# Patient Record
Sex: Female | Born: 1970
Health system: Southern US, Community
[De-identification: ages and names within clinical notes are randomized; demographics above are authoritative.]

## PROBLEM LIST (undated history)

## (undated) DIAGNOSIS — F32A Depression, unspecified: Secondary | ICD-10-CM

## (undated) DIAGNOSIS — K219 Gastro-esophageal reflux disease without esophagitis: Secondary | ICD-10-CM

## (undated) DIAGNOSIS — F419 Anxiety disorder, unspecified: Secondary | ICD-10-CM

## (undated) DIAGNOSIS — M6740 Ganglion, unspecified site: Secondary | ICD-10-CM

## (undated) DIAGNOSIS — J45909 Unspecified asthma, uncomplicated: Secondary | ICD-10-CM

## (undated) DIAGNOSIS — F3281 Premenstrual dysphoric disorder: Secondary | ICD-10-CM

## (undated) DIAGNOSIS — G894 Chronic pain syndrome: Secondary | ICD-10-CM

## (undated) DIAGNOSIS — M79602 Pain in left arm: Secondary | ICD-10-CM

## (undated) DIAGNOSIS — D571 Sickle-cell disease without crisis: Secondary | ICD-10-CM

## (undated) DIAGNOSIS — F329 Major depressive disorder, single episode, unspecified: Secondary | ICD-10-CM

## (undated) HISTORY — DX: Ganglion, unspecified site: M67.40

## (undated) HISTORY — DX: Sickle-cell disease without crisis: D57.1

---

## 2007-10-04 ENCOUNTER — Emergency Department (HOSPITAL_COMMUNITY): Admission: EM | Admit: 2007-10-04 | Discharge: 2007-10-04 | Payer: Self-pay | Admitting: Emergency Medicine

## 2008-07-27 ENCOUNTER — Ambulatory Visit: Payer: Self-pay | Admitting: Gastroenterology

## 2008-07-27 ENCOUNTER — Inpatient Hospital Stay (HOSPITAL_COMMUNITY): Admission: EM | Admit: 2008-07-27 | Discharge: 2008-07-29 | Payer: Self-pay | Admitting: Emergency Medicine

## 2008-07-28 ENCOUNTER — Encounter: Payer: Self-pay | Admitting: Gastroenterology

## 2008-11-12 ENCOUNTER — Emergency Department (HOSPITAL_COMMUNITY): Admission: EM | Admit: 2008-11-12 | Discharge: 2008-11-12 | Payer: Self-pay | Admitting: Emergency Medicine

## 2009-02-14 ENCOUNTER — Emergency Department (HOSPITAL_COMMUNITY): Admission: EM | Admit: 2009-02-14 | Discharge: 2009-02-14 | Payer: Self-pay | Admitting: Emergency Medicine

## 2009-02-16 ENCOUNTER — Other Ambulatory Visit: Admission: RE | Admit: 2009-02-16 | Discharge: 2009-02-16 | Payer: Self-pay | Admitting: Family Medicine

## 2009-02-20 ENCOUNTER — Emergency Department (HOSPITAL_COMMUNITY): Admission: EM | Admit: 2009-02-20 | Discharge: 2009-02-20 | Payer: Self-pay | Admitting: Emergency Medicine

## 2009-09-15 ENCOUNTER — Emergency Department (HOSPITAL_COMMUNITY): Admission: EM | Admit: 2009-09-15 | Discharge: 2009-09-15 | Payer: Self-pay | Admitting: Family Medicine

## 2009-10-07 ENCOUNTER — Emergency Department (HOSPITAL_COMMUNITY): Admission: EM | Admit: 2009-10-07 | Discharge: 2009-10-07 | Payer: Self-pay | Admitting: Emergency Medicine

## 2009-10-12 ENCOUNTER — Emergency Department (HOSPITAL_COMMUNITY): Admission: EM | Admit: 2009-10-12 | Discharge: 2009-10-12 | Payer: Self-pay | Admitting: Family Medicine

## 2010-08-16 LAB — DIFFERENTIAL
Basophils Absolute: 0.1 10*3/uL (ref 0.0–0.1)
Basophils Relative: 1 % (ref 0–1)
Eosinophils Absolute: 0.2 10*3/uL (ref 0.0–0.7)
Eosinophils Relative: 2 % (ref 0–5)
Monocytes Absolute: 0.7 10*3/uL (ref 0.1–1.0)
Neutrophils Relative %: 69 % (ref 43–77)

## 2010-08-16 LAB — BASIC METABOLIC PANEL
BUN: 8 mg/dL (ref 6–23)
Calcium: 9.2 mg/dL (ref 8.4–10.5)
GFR calc non Af Amer: >60 mL/min (ref >60)
Glucose, Bld: 89 mg/dL (ref 70–99)

## 2010-08-16 LAB — CBC
Hemoglobin: 10.3 g/dL — ABNORMAL LOW (ref 12.0–15.0)
MCHC: 33.6 g/dL (ref 30.0–36.0)
MCV: 79.5 fL (ref 78.0–100.0)
RDW: 19.3 % — ABNORMAL HIGH (ref 11.5–15.5)
WBC: 10.7 10*3/uL — ABNORMAL HIGH (ref 4.0–10.5)

## 2010-08-16 LAB — URINALYSIS, ROUTINE W REFLEX MICROSCOPIC
Glucose, UA: NEGATIVE mg/dL
Nitrite: NEGATIVE
Specific Gravity, Urine: 1.015 (ref 1.005–1.030)
Urobilinogen, UA: 1 mg/dL (ref 0.0–1.0)

## 2010-08-16 LAB — RETICULOCYTES
Retic Count, Absolute: 172 10*3/uL (ref 19.0–186.0)
Retic Ct Pct: 4.4 % — ABNORMAL HIGH (ref 0.4–3.1)

## 2010-08-16 LAB — POCT RAPID STREP A (OFFICE): Streptococcus, Group A Screen (Direct): NEGATIVE

## 2010-08-16 LAB — GLUCOSE, CAPILLARY: Glucose-Capillary: 82 mg/dL (ref 70–99)

## 2010-08-23 LAB — GLUCOSE, CAPILLARY
Glucose-Capillary: 107 mg/dL — ABNORMAL HIGH (ref 70–99)
Glucose-Capillary: 123 mg/dL — ABNORMAL HIGH (ref 70–99)
Glucose-Capillary: 129 mg/dL — ABNORMAL HIGH (ref 70–99)
Glucose-Capillary: 98 mg/dL (ref 70–99)

## 2010-08-23 LAB — BASIC METABOLIC PANEL
BUN: 3 mg/dL — ABNORMAL LOW (ref 6–23)
BUN: 7 mg/dL (ref 6–23)
CO2: 25 mEq/L (ref 19–32)
CO2: 28 mEq/L (ref 19–32)
Chloride: 113 mEq/L — ABNORMAL HIGH (ref 96–112)
Glucose, Bld: 80 mg/dL (ref 70–99)
Potassium: 3.8 mEq/L (ref 3.5–5.1)
Potassium: 3.9 mEq/L (ref 3.5–5.1)
Sodium: 140 mEq/L (ref 135–145)

## 2010-08-23 LAB — CBC
HCT: 27.9 % — ABNORMAL LOW (ref 36.0–46.0)
HCT: 28.8 % — ABNORMAL LOW (ref 36.0–46.0)
Hemoglobin: 9.8 g/dL — ABNORMAL LOW (ref 12.0–15.0)
MCHC: 33.5 g/dL (ref 30.0–36.0)
MCHC: 35.2 g/dL (ref 30.0–36.0)
MCV: 78.7 fL (ref 78.0–100.0)
MCV: 78.8 fL (ref 78.0–100.0)
MCV: 78.8 fL (ref 78.0–100.0)
Platelets: 126 10*3/uL — ABNORMAL LOW (ref 150–400)
Platelets: 151 10*3/uL (ref 150–400)
RDW: 19 % — ABNORMAL HIGH (ref 11.5–15.5)
WBC: 8.2 10*3/uL (ref 4.0–10.5)

## 2010-08-23 LAB — HEMOGLOBIN AND HEMATOCRIT, BLOOD
HCT: 27.4 % — ABNORMAL LOW (ref 36.0–46.0)
HCT: 28 % — ABNORMAL LOW (ref 36.0–46.0)
HCT: 28.4 % — ABNORMAL LOW (ref 36.0–46.0)
HCT: 28.6 % — ABNORMAL LOW (ref 36.0–46.0)
HCT: 31.4 % — ABNORMAL LOW (ref 36.0–46.0)
Hemoglobin: 9.1 g/dL — ABNORMAL LOW (ref 12.0–15.0)
Hemoglobin: 9.6 g/dL — ABNORMAL LOW (ref 12.0–15.0)
Hemoglobin: 9.8 g/dL — ABNORMAL LOW (ref 12.0–15.0)

## 2010-08-23 LAB — DIFFERENTIAL
Basophils Relative: 0 % (ref 0–1)
Lymphocytes Relative: 18 % (ref 12–46)
Neutrophils Relative %: 74 % (ref 43–77)

## 2010-08-23 LAB — TYPE AND SCREEN
DAT, IgG: NEGATIVE
PT AG Type: NEGATIVE

## 2010-08-23 LAB — COMPREHENSIVE METABOLIC PANEL
ALT: 14 U/L (ref 0–35)
Albumin: 4.1 g/dL (ref 3.5–5.2)
Calcium: 9.1 mg/dL (ref 8.4–10.5)
GFR calc Af Amer: >60 mL/min (ref >60)
GFR calc non Af Amer: >60 mL/min (ref >60)
Total Bilirubin: 2.3 mg/dL — ABNORMAL HIGH (ref 0.3–1.2)
Total Protein: 7.3 g/dL (ref 6.0–8.3)

## 2010-08-23 LAB — URINALYSIS, ROUTINE W REFLEX MICROSCOPIC
Ketones, ur: NEGATIVE mg/dL
Nitrite: NEGATIVE
Protein, ur: NEGATIVE mg/dL
Specific Gravity, Urine: 1.014 (ref 1.005–1.030)
Urobilinogen, UA: 0.2 mg/dL (ref 0.0–1.0)
pH: 6 (ref 5.0–8.0)

## 2010-08-23 LAB — HEMOCCULT GUIAC POC 1CARD (OFFICE): Fecal Occult Bld: POSITIVE

## 2010-08-23 LAB — PROTIME-INR: Prothrombin Time: 14.5 seconds (ref 11.6–15.2)

## 2010-08-23 LAB — POCT PREGNANCY, URINE: Preg Test, Ur: NEGATIVE

## 2010-09-25 NOTE — H&P (Signed)
NAMEBROOKLYN, Cassie             ACCOUNT NO.:  [id number]   MEDICAL RECORD NO.:  [id number]          PATIENT TYPE:  INP   LOCATION:  5502                         FACILITY:  [facility]   PHYSICIAN:  Sabino Donovan, MD        DATE OF BIRTH:  01/22/1971   DATE OF ADMISSION:  07/26/2008  DATE OF DISCHARGE:                              HISTORY & PHYSICAL   PRIMARY CARE PHYSICIAN:  Renaye Rakers, MD.   CHIEF COMPLAINT:  Rectal bleeding.   HISTORY OF PRESENT ILLNESS:  The patient is a 40 year old obese African  American female with a history of diabetes, sickle cell anemia who  presented with complaint of back pain and rectal bleeding.  She report  she started as a back pain and then she had 4 episodes of diarrhea all  with bright red blood per rectum.  She reports that blood was frank and  dripping into the toilet seat.  Otherwise, she denies any prior history  of bright red blood per rectum, constipation.  Denies any heavy NSAID  use or any heavy alcohol use.  Denies any prior history of liver disease  or prior diagnosis of hemorrhoids.  She denies any dizziness,  lightheadedness, or any orthostatic symptoms.   PAST MEDICAL HISTORY:  1. Diabetes.  2. Sickle cell anemia, her last crisis was many years ago and      currently controlled without any medication.  3. Major depressive disorder.  4. History of H. pylori for which she is on Nexium.   PAST SURGICAL HISTORY:  She had C-section x2.   FAMILY HISTORY:  Noncontributory.  She has no family history of coronary  artery disease or bleeding disorder.   SOCIAL HISTORY:  No tobacco.  No alcohol.  No IV drug use.   DRUG ALLERGIES:  She is allergic to SULFA, causes her bilirubin to go  up.   MEDICATION:  1. Metformin 500 mg p.o. daily.  2. Lexapro 20 mg p.o. daily.  3. Nexium 40 mg p.o. daily.   REVIEW OF SYMPTOMS:  Unremarkable.   PHYSICAL EXAMINATION:  VITAL SIGNS:  Temperature 98.5, pulse 104,  respiratory rate 24, blood pressure  125/86.  GENERAL:  She is an obese female in no acute distress.  HEENT:  PERRLA.  EOMI.  NECK:  No lymphadenopathy, thyromegaly.  No JVD.  CHEST:  Clear to auscultation bilaterally.  CARDIOVASCULAR:  Regular rate and rhythm.  No murmurs, rubs, or gallops.  ABDOMEN:  Soft, nontender, nondistended.  Normoactive bowel sounds.  EXTREMITIES:  No clubbing, cyanosis, or edema.  BACK:  She has focal tenderness to palpation in lower lumbar area.   LABORATORY DATA:  Sodium 140, potassium 4.1, BUN 11, and creatinine 0.8.  White count 9.6, H&H 10.7 and 32.1, and platelets 151.  AST is 13, ALT  14, total protein 7.3, albumin 4.1, hCG is negative.  Urinalysis was  unremarkable.  Chest x-ray shows no acute findings.  Lumbar x-ray was  unremarkable for any acute changes.  CT of the abdomen was done, which  shows enlarged spleen 18 x 12 x 10 cm.  She also  had two small liver  lesions, otherwise unremarkable.  Coag panel was not sent in the ER.   IMPRESSION:  A 40 year old [place] American female with history of  diabetes, sickle cell anemia, now presented for lower gastrointestinal  bleed.  1. Lower gastrointestinal bleed.  Given her age and no risk factors,      likely hemorrhoidal in nature.  However, she is hemodynamically      stable at this time.  We will give normal saline.  We will hold any      antiplatelet agent, we will check PT and PTT.  We will consult in      GI.  We will start the patient on Protonix, although low suspicion      for ulcer bleeding.  2. Diabetes.  Continue sliding scale insulin.  Hold metformin for now      and keep the patient n.p.o.  3. Sickle cell.  No crisis at present.  We will continue normal saline      and give p.r.n. medication.  4. Back pain.  Her lumbar x-ray has been unremarkable, likely      musculoskeletal in nature.  We will continue pain management.  5. Prophylaxis.  On Protonix.      Sabino Donovan, MD  Electronically Signed     MJ/MEDQ  D:   07/27/2008  T:  07/27/2008  Job:  [id number]

## 2010-09-25 NOTE — Discharge Summary (Signed)
Cassie Montgomery, Cassie Montgomery             ACCOUNT NO.:  [id number]   MEDICAL RECORD NO.:  [id number]          PATIENT TYPE:  INP   LOCATION:  5502                         FACILITY:  [facility]   PHYSICIAN:  Michelene Gardener, MD    DATE OF BIRTH:  28-Sep-1970   DATE OF ADMISSION:  07/26/2008  DATE OF DISCHARGE:  07/29/2008                               DISCHARGE SUMMARY   DISCHARGE DIAGNOSES:  1. Lower gastrointestinal bleed secondary to hemorrhoids.  2. Acute post-hemorrhagic anemia secondary to hemorrhoids.  3. Diabetes mellitus.  4. Sickle cell anemia without crisis.  5. Back pain.   DISCHARGE MEDICATIONS:  1. Metformin 500 mg p.o. once daily.  2. Lexapro 20 mg once a day.  3. Nexium 40 mg once a day.   CONSULTATIONS:  GI consult.   PROCEDURES:  Colonoscopy done on July 28, 2008, and it showed normal  colon with internal and external hemorrhoids.   RADIOLOGY STUDIES:  1. Chest x-ray on July 26, 2008, showed no acute findings.  2. Lumbar spine x-ray on July 26, 2008, showed no acute findings.  3. CT scan of the abdomen with contrast on July 27, 2008, showed 2      indeterminate liver lesions with mild splenomegaly and no acute      abdominal findings.  4. CT scan of the pelvis with contrast showed no acute pelvic      findings.  5. Repeat chest x-ray on July 27, 2008, showed no acute findings.   COURSE OF HOSPITALIZATION:  1. Lower GI bleed.  This patient was admitted to the hospital for      further evaluation.  She was started on IV fluids.  Her hemoglobin      has been followed and that has been stable throughout her      hospitalization.  GI consultation was done, and the patient was      recommended for colonoscopy, which was performed on July 28, 2008.      Results of colonoscopy showed normal colon with external and      internal hemorrhoids.  GI think her source of bleeding is her      hemorrhoids and no need for further intervention at this time.  The      patient was  restarted on regular diet, as she tolerated well.  She      would be discharged home today, and she was asked to follow with      her primary doctor to repeat her hemoglobin.  2. Acute post-hemorrhagic anemia that was secondary to hemorrhoids,      and as mentioned above, hemoglobin remains stable in the range      between 9-10 and no need for blood transfusion.  3. Diabetes mellitus that remains stable in the hospital.  Her      metformin was held in the hospital, as she was put on sliding scale      coverage.  By the time of discharge, her metformin was restarted.  4. Sickle cell anemia.  The patient does not have any evidence of      crisis and  no acute intervention in the hospital.  5. Back pain.  This is most likely secondary to muscular sprain, and      as mentioned above, lumbar spine x-ray was done and it was      negative.  6. Liver lesion.  This is an incidental finding in the CT scan of the      abdomen.  Findings were discussed with GI, think those are      nonspecific and GI recommended either repeat CAT scan in 3-6 months      or an MRI as an outpatient for further evaluation.  Findings were      discussed with the patient, and those would be followed by her      primary doctor.   TOTAL ASSESSMENT TIME:  40 minutes.      Michelene Gardener, MD  Electronically Signed     NAE/MEDQ  D:  07/29/2008  T:  07/29/2008  Job:  [id number]   cc:   Renaye Rakers, M.D.

## 2010-10-10 ENCOUNTER — Other Ambulatory Visit: Payer: Self-pay | Admitting: Family Medicine

## 2010-10-10 DIAGNOSIS — Z1231 Encounter for screening mammogram for malignant neoplasm of breast: Secondary | ICD-10-CM

## 2010-10-16 ENCOUNTER — Ambulatory Visit
Admission: RE | Admit: 2010-10-16 | Discharge: 2010-10-16 | Disposition: A | Discharge status: Home / Self Care | Payer: Medicaid Other | Source: Ambulatory Visit | Attending: Family Medicine | Admitting: Family Medicine

## 2010-10-16 DIAGNOSIS — Z1231 Encounter for screening mammogram for malignant neoplasm of breast: Secondary | ICD-10-CM

## 2010-11-22 ENCOUNTER — Encounter: Payer: Medicaid Other | Admitting: Oncology

## 2010-12-04 ENCOUNTER — Other Ambulatory Visit: Payer: Self-pay | Admitting: Oncology

## 2010-12-04 ENCOUNTER — Encounter (HOSPITAL_BASED_OUTPATIENT_CLINIC_OR_DEPARTMENT_OTHER): Payer: Medicare Other | Admitting: Oncology

## 2010-12-04 DIAGNOSIS — D571 Sickle-cell disease without crisis: Secondary | ICD-10-CM

## 2010-12-04 LAB — CBC & DIFF AND RETIC
BASO%: 0.2 % (ref 0.0–2.0)
EOS%: 2.5 % (ref 0.0–7.0)
Eosinophils Absolute: 0.2 10*3/uL (ref 0.0–0.5)
LYMPH%: 20.3 % (ref 14.0–49.7)
MCH: 24.7 pg — ABNORMAL LOW (ref 25.1–34.0)
MCHC: 35.1 g/dL (ref 31.5–36.0)
MCV: 70.4 fL — ABNORMAL LOW (ref 79.5–101.0)
MONO%: 5.3 % (ref 0.0–14.0)
Platelets: 148 10*3/uL (ref 145–400)
RBC: 4.25 10*6/uL (ref 3.70–5.45)
RDW: 17.4 % — ABNORMAL HIGH (ref 11.2–14.5)
nRBC: 0 % (ref 0–0)

## 2010-12-04 LAB — MORPHOLOGY: PLT EST: ADEQUATE

## 2010-12-05 LAB — COMPREHENSIVE METABOLIC PANEL

## 2010-12-05 LAB — HEPATITIS PANEL, ACUTE
Hep A IgM: NEGATIVE
Hep B C IgM: NEGATIVE

## 2011-01-31 ENCOUNTER — Encounter (HOSPITAL_BASED_OUTPATIENT_CLINIC_OR_DEPARTMENT_OTHER): Payer: Medicaid Other | Admitting: Oncology

## 2011-01-31 ENCOUNTER — Other Ambulatory Visit: Payer: Self-pay | Admitting: Oncology

## 2011-01-31 DIAGNOSIS — D571 Sickle-cell disease without crisis: Secondary | ICD-10-CM

## 2011-01-31 LAB — CBC WITH DIFFERENTIAL/PLATELET
Eosinophils Absolute: 0.2 10*3/uL (ref 0.0–0.5)
MONO#: 0.5 10*3/uL (ref 0.1–0.9)
NEUT#: 6.4 10*3/uL (ref 1.5–6.5)
Platelets: 128 10*3/uL — ABNORMAL LOW (ref 145–400)
RBC: 4.01 10*6/uL (ref 3.70–5.45)
RDW: 17 % — ABNORMAL HIGH (ref 11.2–14.5)
WBC: 9.1 10*3/uL (ref 3.9–10.3)
nRBC: 0 % (ref 0–0)

## 2011-02-06 LAB — POCT PREGNANCY, URINE
Operator id: 151321
Preg Test, Ur: NEGATIVE

## 2011-02-06 LAB — DIFFERENTIAL
Basophils Relative: 0
Eosinophils Absolute: 0.2
Eosinophils Relative: 2
Lymphocytes Relative: 20
Monocytes Absolute: 0.4
Neutrophils Relative %: 73

## 2011-02-06 LAB — COMPREHENSIVE METABOLIC PANEL
ALT: 10
Alkaline Phosphatase: 48
BUN: 11
CO2: 25
Calcium: 8.9
GFR calc non Af Amer: >60
Glucose, Bld: 86
Sodium: 142
Total Protein: 6.9

## 2011-02-06 LAB — URINALYSIS, ROUTINE W REFLEX MICROSCOPIC
Nitrite: NEGATIVE
Protein, ur: NEGATIVE
Urobilinogen, UA: 0.2

## 2011-02-06 LAB — CBC
MCV: 79
RDW: 16.9 — ABNORMAL HIGH
WBC: 7.6

## 2011-02-06 LAB — RETICULOCYTES: Retic Ct Pct: 4.2 — ABNORMAL HIGH

## 2011-04-24 ENCOUNTER — Other Ambulatory Visit: Payer: Self-pay

## 2011-04-24 DIAGNOSIS — D571 Sickle-cell disease without crisis: Secondary | ICD-10-CM

## 2011-04-24 MED ORDER — OXYCODONE-ACETAMINOPHEN 5-325 MG PO TABS: 1.0000 | ORAL_TABLET | ORAL | Status: DC | PRN

## 2011-05-04 ENCOUNTER — Emergency Department (HOSPITAL_COMMUNITY)
Admission: EM | Admit: 2011-05-04 | Discharge: 2011-05-05 | Disposition: A | Discharge status: Home / Self Care | Payer: Medicare Other | Attending: Emergency Medicine | Admitting: Emergency Medicine

## 2011-05-04 DIAGNOSIS — M25462 Effusion, left knee: Secondary | ICD-10-CM

## 2011-05-04 DIAGNOSIS — M25562 Pain in left knee: Secondary | ICD-10-CM

## 2011-05-04 DIAGNOSIS — M25569 Pain in unspecified knee: Secondary | ICD-10-CM | POA: Insufficient documentation

## 2011-05-04 DIAGNOSIS — M25469 Effusion, unspecified knee: Secondary | ICD-10-CM | POA: Insufficient documentation

## 2011-05-04 DIAGNOSIS — D571 Sickle-cell disease without crisis: Secondary | ICD-10-CM | POA: Insufficient documentation

## 2011-05-04 HISTORY — DX: Premenstrual dysphoric disorder: F32.81

## 2011-05-04 HISTORY — DX: Sickle-cell disease without crisis: D57.1

## 2011-05-04 NOTE — ED Notes (Signed)
In pediatric waiting area

## 2011-05-04 NOTE — ED Notes (Signed)
Pt c/o left knee swelling; states that last time this happened she had to have her knee drained

## 2011-05-05 ENCOUNTER — Emergency Department (HOSPITAL_COMMUNITY): Payer: Medicare Other

## 2011-05-05 LAB — CBC
HCT: 25.5 % — ABNORMAL LOW (ref 36.0–46.0)
MCHC: 34.9 g/dL (ref 30.0–36.0)
MCV: 73.9 fL — ABNORMAL LOW (ref 78.0–100.0)
Platelets: 110 10*3/uL — ABNORMAL LOW (ref 150–400)
RDW: 17.3 % — ABNORMAL HIGH (ref 11.5–15.5)

## 2011-05-05 LAB — DIFFERENTIAL
Basophils Absolute: 0 10*3/uL (ref 0.0–0.1)
Eosinophils Absolute: 0.2 10*3/uL (ref 0.0–0.7)
Lymphs Abs: 2.5 10*3/uL (ref 0.7–4.0)
Monocytes Absolute: 0.5 10*3/uL (ref 0.1–1.0)
Neutro Abs: 3.9 10*3/uL (ref 1.7–7.7)

## 2011-05-05 LAB — RETICULOCYTES: Retic Count, Absolute: 162.2 10*3/uL (ref 19.0–186.0)

## 2011-05-05 MED ORDER — HYDROMORPHONE HCL PF 1 MG/ML IJ SOLN
1.0000 mg | Freq: Once | INTRAMUSCULAR | Status: AC
  Administered 2011-05-05: 1 mg via INTRAMUSCULAR
  Filled 2011-05-05: qty 1

## 2011-05-05 MED ORDER — METHYLPREDNISOLONE ACETATE 40 MG/ML IJ SUSP
40.0000 mg | INTRAMUSCULAR | Status: AC
  Administered 2011-05-05: 40 mg via INTRA_ARTICULAR
  Filled 2011-05-05: qty 5
  Filled 2011-05-05: qty 1

## 2011-05-05 MED ORDER — LIDOCAINE HCL (PF) 1 % IJ SOLN
INTRAMUSCULAR | Status: AC
  Administered 2011-05-05: 30 mL
  Filled 2011-05-05: qty 5

## 2011-05-05 MED ORDER — OXYCODONE-ACETAMINOPHEN 5-325 MG PO TABS: 2.0000 | ORAL_TABLET | ORAL | Status: AC | PRN

## 2011-05-05 MED ORDER — LIDOCAINE HCL (PF) 1 % IJ SOLN
INTRAMUSCULAR | Status: AC
  Filled 2011-05-05: qty 10

## 2011-05-05 MED ORDER — LIDOCAINE HCL (PF) 1 % IJ SOLN
30.0000 mL | Freq: Once | INTRAMUSCULAR | Status: AC
  Administered 2011-05-05: 30 mL

## 2011-05-05 NOTE — ED Provider Notes (Signed)
Medical screening examination/treatment/procedure(s) were performed by non-physician practitioner and as supervising physician I was immediately available for consultation/collaboration.   Dione Booze, MD 05/05/11 [phone]

## 2011-05-05 NOTE — ED Notes (Signed)
Patient transported to X-ray 

## 2011-05-05 NOTE — ED Notes (Signed)
Pt has been ambulating through ED waiting room.

## 2011-05-05 NOTE — ED Notes (Signed)
Patient states feels like fluid behind left knee states has happened in the past where they had to drain 25ml of fluid but it was not gout. States this episode started 2 days ago and has gotten worse. Reports swelling but is not red or warm.

## 2011-05-05 NOTE — ED Provider Notes (Signed)
History     CSN: [id number]  Arrival date & time 05/04/11  2157   First MD Initiated Contact with Patient 05/05/11 0105      Chief Complaint  Patient presents with  . Leg Pain    (Consider location/radiation/quality/duration/timing/severity/associated sxs/prior treatment) HPI Comments: Patient here with left knee pain and swelling - reports history of same - states last time this happened she had to have the knee drained - reports no fever, chills, has a history of sickle cell anemia - reports this does not feel like her sickle cell pain - has tried hydrocodone and oxycodone at home without relief.  Patient is a 40 y.o. female presenting with leg pain. The history is provided by the patient. No language interpreter was used.  Leg Pain  The incident occurred 2 days ago. The incident occurred at home. There was no injury mechanism. The pain is present in the left knee. The quality of the pain is described as sharp and throbbing. The pain is at a severity of 10/10. The pain is severe. The pain has been constant since onset. Pertinent negatives include no numbness, no inability to bear weight, no loss of motion, no muscle weakness, no loss of sensation and no tingling. She reports no foreign bodies present. The symptoms are aggravated by bearing weight and palpation. She has tried nothing for the symptoms. The treatment provided no relief.    Past Medical History  Diagnosis Date  . PMDD (premenstrual dysphoric disorder)   . Sickle cell anemia     Past Surgical History  Procedure Date  . Cesarean section     History reviewed. No pertinent family history.  History  Substance Use Topics  . Smoking status: Never Smoker   . Smokeless tobacco: Not on file  . Alcohol Use: No    OB History    Grav Para Term Preterm Abortions TAB SAB Ect Mult Living                  Review of Systems  Musculoskeletal: Positive for joint swelling and arthralgias.  Neurological: Negative for  tingling and numbness.  All other systems reviewed and are negative.    Allergies  Sulfa antibiotics  Home Medications   Current Outpatient Rx  Name Route Sig Dispense Refill  . OXYCODONE-ACETAMINOPHEN 5-325 MG PO TABS Oral Take 1 tablet by mouth every 4 (four) hours as needed. Take 1-2 tablets for pain. Has only been taking past 2 days for leg pain.     . OXYCODONE-ACETAMINOPHEN 5-325 MG PO TABS Oral Take 1-2 tablets by mouth every 4 (four) hours as needed. 100 tablet 0    BP 136/74  Pulse 71  Temp(Src) 98.3 F (36.8 C) (Oral)  Resp 17  SpO2 100%  LMP 04/13/2011  Physical Exam  Nursing note and vitals reviewed. Constitutional: She is oriented to person, place, and time. She appears well-developed and well-nourished. No distress.  HENT:  Head: Normocephalic and atraumatic.  Right Ear: External ear normal.  Left Ear: External ear normal.  Mouth/Throat: Oropharynx is clear and moist. No oropharyngeal exudate.  Eyes: Conjunctivae are normal. Pupils are equal, round, and reactive to light. No scleral icterus.  Neck: Normal range of motion. Neck supple.  Cardiovascular: Normal rate, regular rhythm and normal heart sounds.   Pulmonary/Chest: Effort normal and breath sounds normal. No respiratory distress. She exhibits no tenderness.  Abdominal: Soft. Bowel sounds are normal. She exhibits no distension. There is no tenderness.  Musculoskeletal:  Left knee: She exhibits decreased range of motion, swelling and effusion. She exhibits no LCL laxity and no MCL laxity. tenderness found.  Lymphadenopathy:    She has no cervical adenopathy.  Neurological: She is alert and oriented to person, place, and time. No cranial nerve deficit.  Skin: Skin is warm and dry. No erythema.  Psychiatric: She has a normal mood and affect. Her behavior is normal. Judgment and thought content normal.    ED Course  Apiration of blood/fluid Date/Time: 05/05/2011 4:55 AM Performed by: Marisue Humble,  Joy Haegele C. Authorized by: Patrecia Pour Consent: Verbal consent obtained. Written consent not obtained. Risks and benefits: risks, benefits and alternatives were discussed Consent given by: patient Patient understanding: patient states understanding of the procedure being performed Patient consent: the patient's understanding of the procedure does not match consent given Procedure consent: procedure consent does not match procedure scheduled Relevant documents: relevant documents not present or verified Test results: test results not available Site marked: the operative site was not marked Imaging studies: imaging studies not available Patient identity confirmed: verbally with patient and arm band Time out: Immediately prior to procedure a "time out" was called to verify the correct patient, procedure, equipment, support staff and site/side marked as required. Preparation: Patient was prepped and draped in the usual sterile fashion. Local anesthesia used: no Patient sedated: no Patient tolerance: Patient tolerated the procedure well with no immediate complications. Comments: Approach to the knee was using the superior lateral approach - patellar landmarks were marked, knee was swabbed with betadine, draped sterile, accessed with 21guage 1 inch needle, 10cc straw clear colored effusion obtained, then 40mg  depomedrol and 2cc 1% lidocaine was injected into the joint, patient tolerated the procedure well.   (including critical care time)  Labs Reviewed  CBC - Abnormal; Notable for the following:    RBC 3.45 (*)    Hemoglobin 8.9 (*)    HCT 25.5 (*)    MCV 73.9 (*)    MCH 25.8 (*)    RDW 17.3 (*)    Platelets 110 (*) PLATELET COUNT CONFIRMED BY SMEAR   All other components within normal limits  RETICULOCYTES - Abnormal; Notable for the following:    Retic Ct Pct 4.7 (*)    RBC. 3.45 (*)    All other components within normal limits  DIFFERENTIAL   Dg Knee Complete 4 Views  Left  05/05/2011  *RADIOLOGY REPORT*  Clinical Data: Left knee pain and swelling.  LEFT KNEE - COMPLETE 4+ VIEW  Comparison: None.  Findings: There is no evidence of fracture or dislocation.  The joint spaces are preserved.  There is mild squaring of the medial compartment; the patellofemoral joint is grossly unremarkable in appearance.  A fabella is noted.  A moderate joint effusion is seen.  The visualized soft tissues are otherwise unremarkable in appearance.  IMPRESSION:  1.  No evidence of fracture or dislocation. 2.  Moderate knee joint effusion noted.  Original Report Authenticated By: Tonia Ghent, M.D.     Left knee pain and effusion    MDM  Patient here with left knee pain and effusion not related to sickle cell disease -no contraindications for joint aspiration exist, so I have removed 10cc effusion.  Patient will continue her meloxicam and will give short course of narcotic pain medication - then she will follow up with her orthopedist.  Crutches and knee immobilizer per orthotech.        Izola Price [place], [place] 05/05/11 0500

## 2011-05-11 ENCOUNTER — Telehealth: Payer: Self-pay | Admitting: Oncology

## 2011-05-11 NOTE — Telephone Encounter (Signed)
Re route a message to Dr. Reece Agar regarding appt , first opening in in March

## 2011-05-17 NOTE — Telephone Encounter (Signed)
She can get lab in March at time of visit

## 2011-05-18 ENCOUNTER — Telehealth: Payer: Self-pay | Admitting: Oncology

## 2011-05-18 NOTE — Telephone Encounter (Signed)
Called pt ,left message for March 2013 appts , lab and MD visit

## 2011-05-20 ENCOUNTER — Telehealth: Payer: Self-pay | Admitting: Nurse Practitioner

## 2011-05-20 DIAGNOSIS — D571 Sickle-cell disease without crisis: Secondary | ICD-10-CM

## 2011-05-20 MED ORDER — OXYCODONE-ACETAMINOPHEN 5-325 MG PO TABS: 1.0000 | ORAL_TABLET | ORAL | Status: DC | PRN

## 2011-05-20 NOTE — Telephone Encounter (Signed)
Pt called requesting refill of pain medication

## 2011-05-21 ENCOUNTER — Other Ambulatory Visit: Payer: Self-pay | Admitting: *Deleted

## 2011-05-21 ENCOUNTER — Telehealth: Payer: Self-pay | Admitting: *Deleted

## 2011-05-21 ENCOUNTER — Other Ambulatory Visit: Payer: Self-pay | Admitting: Oncology

## 2011-05-21 ENCOUNTER — Ambulatory Visit (HOSPITAL_BASED_OUTPATIENT_CLINIC_OR_DEPARTMENT_OTHER): Payer: Medicaid Other

## 2011-05-21 DIAGNOSIS — D649 Anemia, unspecified: Secondary | ICD-10-CM

## 2011-05-21 LAB — CBC & DIFF AND RETIC
Basophils Absolute: 0 10*3/uL (ref 0.0–0.1)
HCT: 30.3 % — ABNORMAL LOW (ref 34.8–46.6)
HGB: 10.5 g/dL — ABNORMAL LOW (ref 11.6–15.9)
Immature Retic Fract: 26.2 % — ABNORMAL HIGH (ref 1.60–10.00)
LYMPH%: 27.2 % (ref 14.0–49.7)
MCHC: 34.7 g/dL (ref 31.5–36.0)
MONO#: 0.5 10*3/uL (ref 0.1–0.9)
NEUT%: 64.9 % (ref 38.4–76.8)
Platelets: 120 10*3/uL — ABNORMAL LOW (ref 145–400)
WBC: 7.4 10*3/uL (ref 3.9–10.3)
lymph#: 2 10*3/uL (ref 0.9–3.3)

## 2011-05-21 NOTE — Telephone Encounter (Signed)
Pt. called & states she was in the ED in Dec for fluid on her knee & found out she was in crisis.  She would like to come in before appt. In March for a blood count to see if her hgb is improving.  She feels better & thinks that it should be up.

## 2011-05-29 ENCOUNTER — Encounter (HOSPITAL_COMMUNITY): Payer: Self-pay

## 2011-05-29 ENCOUNTER — Observation Stay (HOSPITAL_COMMUNITY)
Admission: EM | Admit: 2011-05-29 | Discharge: 2011-05-29 | Disposition: A | Discharge status: Home / Self Care | Payer: Medicare Other | Source: Ambulatory Visit | Attending: Emergency Medicine | Admitting: Emergency Medicine

## 2011-05-29 DIAGNOSIS — D571 Sickle-cell disease without crisis: Secondary | ICD-10-CM

## 2011-05-29 DIAGNOSIS — D5700 Hb-SS disease with crisis, unspecified: Secondary | ICD-10-CM | POA: Diagnosis not present

## 2011-05-29 LAB — DIFFERENTIAL
Basophils Absolute: 0 10*3/uL (ref 0.0–0.1)
Basophils Relative: 0 % (ref 0–1)
Eosinophils Absolute: 0.1 10*3/uL (ref 0.0–0.7)
Eosinophils Relative: 2 % (ref 0–5)
Lymphocytes Relative: 32 % (ref 12–46)
Lymphs Abs: 2.4 10*3/uL (ref 0.7–4.0)
Monocytes Absolute: 0.5 10*3/uL (ref 0.1–1.0)
Monocytes Relative: 6 % (ref 3–12)
Neutro Abs: 4.5 10*3/uL (ref 1.7–7.7)
Neutrophils Relative %: 60 % (ref 43–77)

## 2011-05-29 LAB — CBC
HCT: 30.3 % — ABNORMAL LOW (ref 36.0–46.0)
Hemoglobin: 10.6 g/dL — ABNORMAL LOW (ref 12.0–15.0)
MCH: 25.7 pg — ABNORMAL LOW (ref 26.0–34.0)
MCHC: 35 g/dL (ref 30.0–36.0)
MCV: 73.4 fL — ABNORMAL LOW (ref 78.0–100.0)
Platelets: 122 10*3/uL — ABNORMAL LOW (ref 150–400)
RBC: 4.13 MIL/uL (ref 3.87–5.11)
RDW: 16.3 % — ABNORMAL HIGH (ref 11.5–15.5)
WBC: 7.5 10*3/uL (ref 4.0–10.5)

## 2011-05-29 LAB — BASIC METABOLIC PANEL
BUN: 12 mg/dL (ref 6–23)
CO2: 27 mEq/L (ref 19–32)
Calcium: 9.7 mg/dL (ref 8.4–10.5)
Chloride: 106 mEq/L (ref 96–112)
Creatinine, Ser: 0.85 mg/dL (ref 0.50–1.10)
GFR calc Af Amer: >90 mL/min (ref >90)
GFR calc non Af Amer: 84 mL/min — ABNORMAL LOW (ref >90)
Glucose, Bld: 107 mg/dL — ABNORMAL HIGH (ref 70–99)
Potassium: 4.1 mEq/L (ref 3.5–5.1)
Sodium: 140 mEq/L (ref 135–145)

## 2011-05-29 LAB — RETICULOCYTES
RBC.: 4.13 MIL/uL (ref 3.87–5.11)
Retic Count, Absolute: 148.7 10*3/uL (ref 19.0–186.0)
Retic Ct Pct: 3.6 % — ABNORMAL HIGH (ref 0.4–3.1)

## 2011-05-29 MED ORDER — SODIUM CHLORIDE 0.9 % IV SOLN
Freq: Once | INTRAVENOUS | Status: AC
  Administered 2011-05-29: 15:00:00 via INTRAVENOUS

## 2011-05-29 MED ORDER — ONDANSETRON HCL 4 MG/2ML IJ SOLN
4.0000 mg | Freq: Once | INTRAMUSCULAR | Status: AC
  Administered 2011-05-29: 4 mg via INTRAVENOUS
  Filled 2011-05-29: qty 2

## 2011-05-29 MED ORDER — HYDROMORPHONE HCL PF 1 MG/ML IJ SOLN
2.0000 mg | INTRAMUSCULAR | Status: DC | PRN
  Administered 2011-05-29 (×3): 2 mg via INTRAVENOUS
  Filled 2011-05-29 (×4): qty 1

## 2011-05-29 MED ORDER — OXYCODONE-ACETAMINOPHEN 5-325 MG PO TABS: 1.0000 | ORAL_TABLET | ORAL | Status: AC | PRN

## 2011-05-29 MED ORDER — DIPHENHYDRAMINE HCL 50 MG/ML IJ SOLN
25.0000 mg | Freq: Once | INTRAMUSCULAR | Status: AC
  Administered 2011-05-29: 25 mg via INTRAVENOUS
  Filled 2011-05-29: qty 1

## 2011-05-29 MED ORDER — DIPHENHYDRAMINE HCL 50 MG/ML IJ SOLN
25.0000 mg | Freq: Once | INTRAMUSCULAR | Status: AC
  Administered 2011-05-29: 22:00:00 via INTRAVENOUS
  Filled 2011-05-29: qty 1

## 2011-05-29 MED ORDER — DIPHENHYDRAMINE HCL 50 MG/ML IJ SOLN
INTRAMUSCULAR | Status: AC
  Administered 2011-05-29: 25 mg via INTRAMUSCULAR
  Filled 2011-05-29: qty 1

## 2011-05-29 MED ORDER — HYDROMORPHONE HCL PF 2 MG/ML IJ SOLN
2.0000 mg | Freq: Once | INTRAMUSCULAR | Status: AC
  Administered 2011-05-29: 2 mg via INTRAVENOUS
  Filled 2011-05-29: qty 1

## 2011-05-29 MED ORDER — SODIUM CHLORIDE 0.9 % IV BOLUS (SEPSIS)
2000.0000 mL | Freq: Once | INTRAVENOUS | Status: AC
  Administered 2011-05-29: 1000 mL via INTRAVENOUS

## 2011-05-29 MED ORDER — HYDROMORPHONE HCL PF 1 MG/ML IJ SOLN
1.0000 mg | Freq: Once | INTRAMUSCULAR | Status: AC
  Administered 2011-05-29: 1 mg via INTRAVENOUS
  Filled 2011-05-29: qty 1

## 2011-05-29 MED ORDER — ONDANSETRON HCL 4 MG/2ML IJ SOLN: 4.0000 mg | Freq: Four times a day (QID) | INTRAMUSCULAR | Status: DC | PRN

## 2011-05-29 NOTE — ED Provider Notes (Signed)
2:01 PM Patient is in CDU under observation, sickle cell protocol. Patient reports she has had no relief of pain thus far.  Pain is in all extremities equally, is throbbing in nature.  States she is very thirsty.  Thinks she may have caused the crisis by going on a "fast" with her church for 3 weeks - has changed her diet drastically and is drinking much less liquid.  Denies CP, SOB, abdominal pain, fevers.  3:37 PM Patient discussed with Langley Adie, PA-C, who assumes care of patient at change of shift.    Rise Patience, [place] 05/29/11 1537

## 2011-05-29 NOTE — ED Provider Notes (Signed)
Medical screening examination/treatment/procedure(s) were performed by non-physician practitioner and as supervising physician I was immediately available for consultation/collaboration.   Dione Booze, MD 05/29/11 [phone]

## 2011-05-29 NOTE — ED Provider Notes (Signed)
She reports feeling better with moderate improvement. Will continue to observe for anticipate discharge home.  10:20 - the patient is ambulatory to the bathroom. NAD. She reports she feels ready for discharge home.  Rodena Medin, PA-C 05/29/11 2223

## 2011-05-29 NOTE — ED Notes (Signed)
Sickle cell crisis began during the night, pain arms, legs and hands

## 2011-05-29 NOTE — ED Provider Notes (Signed)
Medical screening examination/treatment/procedure(s) were performed by non-physician practitioner and as supervising physician I was immediately available for consultation/collaboration.   Alishia Lebo, MD 05/29/11 1650 

## 2011-05-29 NOTE — ED Provider Notes (Signed)
History     CSN: [id number]  Arrival date & time 05/29/11  1111   First MD Initiated Contact with Patient 05/29/11 1122      Chief Complaint  Patient presents with  . Sickle Cell Pain Crisis     Patient is a 41 y.o. female presenting with sickle cell pain. The history is provided by the patient.  Sickle Cell Pain Crisis   41 year old AA female presents to the ED with a sickle cell pain crisis. She reports the pain began during the night and is "all over." She states it is severe, unchanging, and in all extremities. She took 2 Percocet this morning without relief. She states that her last pain crisis like this was years ago. She was here in December for L knee pain and effusion related to her sickle cell anemia. She denies shortness of breath, chest pain, fever, dizziness, weakness, abdominal pain, and nausea.    Past Medical History  Diagnosis Date  . PMDD (premenstrual dysphoric disorder)   . Sickle cell anemia     Past Surgical History  Procedure Date  . Cesarean section     No family history on file.  History  Substance Use Topics  . Smoking status: Never Smoker   . Smokeless tobacco: Not on file  . Alcohol Use: No    OB History    Grav Para Term Preterm Abortions TAB SAB Ect Mult Living                  Review of Systems All pertinent positives and negatives reviewed in the history of present illness  Allergies  Sulfa antibiotics  Home Medications   Current Outpatient Rx  Name Route Sig Dispense Refill  . OXYCODONE-ACETAMINOPHEN 5-325 MG PO TABS Oral Take 2 tablets by mouth every 4 (four) hours as needed. For pain      BP 120/77  Pulse 88  Temp(Src) 97.8 F (36.6 C) (Oral)  Resp 20  Ht 5\' 6"  (1.676 m)  Wt 215 lb (97.523 kg)  BMI 34.70 kg/m2  SpO2 100%  LMP 05/15/2011  Physical Exam  Constitutional: She is oriented to person, place, and time. She appears well-developed and well-nourished. She appears distressed.  Cardiovascular: Normal rate,  regular rhythm, normal heart sounds and intact distal pulses.   Pulmonary/Chest: Effort normal and breath sounds normal.  Abdominal: Soft. Bowel sounds are normal.  Neurological: She is alert and oriented to person, place, and time.  Skin: Skin is warm and dry. She is not diaphoretic.  Psychiatric: She has a normal mood and affect. Her behavior is normal.    ED Course  Procedures (including critical care time)  Labs Reviewed  CBC - Abnormal; Notable for the following:    Hemoglobin 10.6 (*)    HCT 30.3 (*)    MCV 73.4 (*)    MCH 25.7 (*)    RDW 16.3 (*)    Platelets 122 (*)    All other components within normal limits  BASIC METABOLIC PANEL - Abnormal; Notable for the following:    Glucose, Bld 107 (*)    GFR calc non Af Amer 84 (*)    All other components within normal limits  RETICULOCYTES - Abnormal; Notable for the following:    Retic Ct Pct 3.6 (*)    All other components within normal limits  DIFFERENTIAL   No results found.   No diagnosis found.    MDM  MDM Reviewed: vitals, nursing note and previous chart  Reviewed previous: labs Interpretation: labs            Carlyle Dolly, PA-C 05/29/11 1435  Carlyle Dolly, PA-C 05/29/11 1442

## 2011-05-30 NOTE — Progress Notes (Signed)
Observation review is complete. 

## 2011-05-31 NOTE — ED Provider Notes (Signed)
Medical screening examination/treatment/procedure(s) were performed by non-physician practitioner and as supervising physician I was immediately available for consultation/collaboration.   Dione Booze, MD 05/31/11 [phone]

## 2011-06-19 ENCOUNTER — Other Ambulatory Visit: Payer: Self-pay | Admitting: *Deleted

## 2011-06-19 DIAGNOSIS — D571 Sickle-cell disease without crisis: Secondary | ICD-10-CM

## 2011-06-19 MED ORDER — OXYCODONE-ACETAMINOPHEN 5-325 MG PO TABS: 2.0000 | ORAL_TABLET | ORAL | Status: DC | PRN

## 2011-07-15 ENCOUNTER — Other Ambulatory Visit: Payer: Medicare Other | Admitting: Lab

## 2011-07-15 ENCOUNTER — Telehealth: Payer: Self-pay | Admitting: Oncology

## 2011-07-15 ENCOUNTER — Encounter: Payer: Self-pay | Admitting: Oncology

## 2011-07-15 ENCOUNTER — Ambulatory Visit (HOSPITAL_BASED_OUTPATIENT_CLINIC_OR_DEPARTMENT_OTHER): Payer: Medicare Other | Admitting: Oncology

## 2011-07-15 VITALS — BP 128/88 | HR 75 | Temp 97.4°F | Ht 66.0 in | Wt 222.2 lb

## 2011-07-15 DIAGNOSIS — D571 Sickle-cell disease without crisis: Secondary | ICD-10-CM

## 2011-07-15 DIAGNOSIS — G43109 Migraine with aura, not intractable, without status migrainosus: Secondary | ICD-10-CM | POA: Diagnosis not present

## 2011-07-15 HISTORY — DX: Sickle-cell disease without crisis: D57.1

## 2011-07-15 LAB — CBC & DIFF AND RETIC
BASO%: 0.3 % (ref 0.0–2.0)
Immature Retic Fract: 33.8 % — ABNORMAL HIGH (ref 1.60–10.00)
LYMPH%: 25.6 % (ref 14.0–49.7)
MCHC: 35.4 g/dL (ref 31.5–36.0)
MONO#: 0.4 10*3/uL (ref 0.1–0.9)
NEUT#: 5 10*3/uL (ref 1.5–6.5)
RBC: 4.25 10*6/uL (ref 3.70–5.45)
RDW: 16.4 % — ABNORMAL HIGH (ref 11.2–14.5)
Retic %: 5.14 % — ABNORMAL HIGH (ref 0.70–2.10)
Retic Ct Abs: 218.45 10*3/uL — ABNORMAL HIGH (ref 33.70–90.70)
WBC: 7.3 10*3/uL (ref 3.9–10.3)
lymph#: 1.9 10*3/uL (ref 0.9–3.3)

## 2011-07-15 LAB — COMPREHENSIVE METABOLIC PANEL
AST: 12 U/L (ref 0–37)
Alkaline Phosphatase: 56 U/L (ref 39–117)
Glucose, Bld: 95 mg/dL (ref 70–99)
Sodium: 142 mEq/L (ref 135–145)
Total Bilirubin: 3.3 mg/dL — ABNORMAL HIGH (ref 0.3–1.2)
Total Protein: 7 g/dL (ref 6.0–8.3)

## 2011-07-15 NOTE — Telephone Encounter (Signed)
9/11 appt printed for pt and ref faxed to [facility]

## 2011-07-15 NOTE — Progress Notes (Signed)
Hematology and Oncology Follow Up Visit  Cassie Montgomery [id number] 1971/02/13 41 y.o. 07/15/2011 2:19 PM   Principle Diagnosis: Encounter Diagnoses  Name Primary?  . Sickle cell disease without crisis   . Migraine headache with aura Yes     Interim History:   This this is a 41 year old woman with sickle cell disease who here from [place] about a year ago. Fortunately she has relatively mild disease. Since visit here 6 months ago, she has had an occasional visit to the emergency department for early pain crisis but was able to be treated and released without hospitalization. Only new problem is some persistent and progressive migraine-type headaches. These tend to be frontal headaches. They can be associated with changes in vision with loss of central vision.  Medications: reviewed  Allergies:  Allergies  Allergen Reactions  . Sulfa Antibiotics Other (See Comments)    Increased bilirubin    Review of Systems: Constitutional:   No constitutional symptoms Respiratory: No dyspnea or cough Cardiovascular:  No chest pain or palpitations Gastrointestinal: No abdominal pain or change in bowel habit Genito-Urinary: No urinary symptoms Musculoskeletal: Currently no muscle or bone pain Neurologic: See above Skin: Remaining ROS negative.  Physical Exam: Blood pressure 128/88, pulse 75, temperature 97.4 F (36.3 C), temperature source Oral, height 5\' 6"  (1.676 m), weight 222 lb 3.2 oz (100.789 kg). Wt Readings from Last 3 Encounters:  07/15/11 222 lb 3.2 oz (100.789 kg)  05/29/11 215 lb (97.523 kg)  05/05/11 220 lb (99.791 kg)     General appearance: Well-nourished woman in no distress HENNT: Pharynx no erythema or exudate Lymph nodes: No axillary adenopathy Breasts: Lungs: Clear to auscultation resonant to percussion  Heart: Regular rhythm no murmur Abdomen: Soft nontender no mass no organomegaly Extremities: No edema no calf tenderness Vascular: No cyanosis Neurologic:  Motor strength is 5 over 5 full extraocular movements Skin: No rash or ecchymosis Lab Results: Lab Results  Component Value Date   WBC 7.3 07/15/2011   HGB 10.9* 07/15/2011   HCT 30.8* 07/15/2011   MCV 72.5* 07/15/2011   PLT 156 07/15/2011     Chemistry      Component Value Date/Time   NA 140 05/29/2011 1239   K 4.1 05/29/2011 1239   CL 106 05/29/2011 1239   CO2 27 05/29/2011 1239   BUN 12 05/29/2011 1239   CREATININE 0.85 05/29/2011 1239      Component Value Date/Time   CALCIUM 9.7 05/29/2011 1239   ALKPHOS (Added) 66 12/04/2010 1528   ALKPHOS (Revised) 66 12/04/2010 1528   ALKPHOS (Revised) 66 12/04/2010 1528   ALKPHOS (Revised) 66 12/04/2010 1528   AST (Added) 13 12/04/2010 1528   AST (Revised) 13 12/04/2010 1528   AST (Revised) 13 12/04/2010 1528   AST (Revised) 13 12/04/2010 1528   ALT (Added) 10 12/04/2010 1528   ALT (Revised) 10 12/04/2010 1528   ALT (Revised) 10 12/04/2010 1528   ALT (Revised) 10 12/04/2010 1528   BILITOT (Added) 3.0* 12/04/2010 1528   BILITOT (Revised) 3.0* 12/04/2010 1528   BILITOT (Revised) 3.0* 12/04/2010 1528   BILITOT (Revised) 3.0* 12/04/2010 1528       Impression and Plan: #1. Sickle cell disease. I would urge her to get his ejection has [place] disease given her indolent course. She uses Percocet 5/325 for breakthrough pain and has not abused narcotic analgesics. Hepatitis A, B., C., profile obtained at the time of her first visit here in July 2012 was negative. She gave a history of  previous HIV negative testing through her primary care. She tells me she is no longer seeing the primary care physician who referred her here. I would like to get her set up to be evaluated by Dr. Minerva Areola D. for her primary care needs and to help follow her sickle cell disease over time.  #2.  migraine headache. I'm going to give her a prescription for sumatriptan and then make a referral to neurology for further evaluation.   CC:. Dr. Willey Blade; [facility]   Levert Feinstein,  MD 3/4/20132:19 PM

## 2011-07-16 ENCOUNTER — Telehealth: Payer: Self-pay | Admitting: Oncology

## 2011-07-16 NOTE — Telephone Encounter (Signed)
Ref faxed 3/4 for [facility] and appt printed for 9/11   aom

## 2011-07-24 ENCOUNTER — Other Ambulatory Visit: Payer: Self-pay | Admitting: *Deleted

## 2011-07-25 ENCOUNTER — Telehealth: Payer: Self-pay | Admitting: Oncology

## 2011-07-25 ENCOUNTER — Other Ambulatory Visit: Payer: Self-pay | Admitting: *Deleted

## 2011-07-25 DIAGNOSIS — D571 Sickle-cell disease without crisis: Secondary | ICD-10-CM

## 2011-07-25 DIAGNOSIS — R51 Headache: Secondary | ICD-10-CM

## 2011-07-25 MED ORDER — OXYCODONE-ACETAMINOPHEN 5-325 MG PO TABS: ORAL_TABLET | ORAL | Status: DC

## 2011-07-25 MED ORDER — SUMATRIPTAN SUCCINATE 50 MG PO TABS: ORAL_TABLET | ORAL | Status: DC

## 2011-07-25 NOTE — Telephone Encounter (Signed)
l/m with dr Minerva Areola dean appt for 4/1 at 2:00,also left ph and address,also that i am still awaiting neuro appt    aom

## 2011-08-01 ENCOUNTER — Telehealth: Payer: Self-pay | Admitting: *Deleted

## 2011-08-01 DIAGNOSIS — D571 Sickle-cell disease without crisis: Secondary | ICD-10-CM

## 2011-08-01 MED ORDER — OXYCODONE-ACETAMINOPHEN 5-325 MG PO TABS: ORAL_TABLET | ORAL | Status: DC

## 2011-08-01 NOTE — Telephone Encounter (Signed)
Received vm from pt asking for return call.  Returned call & pt reports that she has had to take more of her pain med over the last two wks b/c she has been in crisis.  She didn't want to go to the hosp b/c she was afraid they would admit her & just tried to self- medicate.  She reports having # 5 tabs left & b/c she has taken so much & is feeling better now, she knows she has to taper b/c she if afraid of going through withdrawals.  She reports that she stiff when the med wears off & she needs more pills so she can taper & she reports that she has done this before & knows how to do this.  Will discuss with Dr Cyndie Chime.

## 2011-08-27 ENCOUNTER — Telehealth: Payer: Self-pay | Admitting: Oncology

## 2011-08-27 ENCOUNTER — Other Ambulatory Visit: Payer: Self-pay | Admitting: *Deleted

## 2011-08-27 DIAGNOSIS — D571 Sickle-cell disease without crisis: Secondary | ICD-10-CM

## 2011-08-27 MED ORDER — OXYCODONE-ACETAMINOPHEN 5-325 MG PO TABS: ORAL_TABLET | ORAL | Status: DC

## 2011-08-27 NOTE — Telephone Encounter (Signed)
Pt. called & request refill on her pain med-percocet.  This was done.

## 2011-08-27 NOTE — Telephone Encounter (Signed)
Called [facility] and they did get her ref. and tried to call her on 3/25 and she did not respond.  Called pt and she states they never called her.  Was in the process of giving her their number and we became d/c.  I called her back and left their number and also asked her to call me back with her appt.

## 2011-09-09 ENCOUNTER — Emergency Department (HOSPITAL_COMMUNITY)
Admission: EM | Admit: 2011-09-09 | Discharge: 2011-09-10 | Disposition: A | Discharge status: Home / Self Care | Payer: Medicare Other | Attending: Emergency Medicine | Admitting: Emergency Medicine

## 2011-09-09 ENCOUNTER — Encounter (HOSPITAL_COMMUNITY): Payer: Self-pay | Admitting: Emergency Medicine

## 2011-09-09 DIAGNOSIS — M79609 Pain in unspecified limb: Secondary | ICD-10-CM | POA: Insufficient documentation

## 2011-09-09 DIAGNOSIS — D5700 Hb-SS disease with crisis, unspecified: Secondary | ICD-10-CM | POA: Insufficient documentation

## 2011-09-09 DIAGNOSIS — Z79899 Other long term (current) drug therapy: Secondary | ICD-10-CM | POA: Diagnosis not present

## 2011-09-09 NOTE — ED Notes (Signed)
Pt reports left sided pain in leg that did not get better with at home medication. Sickle cell anemia.

## 2011-09-10 LAB — CBC
HCT: 29.8 % — ABNORMAL LOW (ref 36.0–46.0)
MCV: 73.9 fL — ABNORMAL LOW (ref 78.0–100.0)
RBC: 4.03 MIL/uL (ref 3.87–5.11)
RDW: 17.5 % — ABNORMAL HIGH (ref 11.5–15.5)
WBC: 9.9 10*3/uL (ref 4.0–10.5)

## 2011-09-10 LAB — RETICULOCYTES: Retic Count, Absolute: 229.7 10*3/uL — ABNORMAL HIGH (ref 19.0–186.0)

## 2011-09-10 MED ORDER — HYDROMORPHONE HCL PF 1 MG/ML IJ SOLN: 1.0000 mg | Freq: Once | INTRAMUSCULAR | Status: DC

## 2011-09-10 MED ORDER — SODIUM CHLORIDE 0.9 % IV BOLUS (SEPSIS)
1000.0000 mL | Freq: Once | INTRAVENOUS | Status: AC
  Administered 2011-09-10: 1000 mL via INTRAVENOUS

## 2011-09-10 MED ORDER — OXYCODONE-ACETAMINOPHEN 5-325 MG PO TABS: 2.0000 | ORAL_TABLET | ORAL | Status: AC | PRN

## 2011-09-10 MED ORDER — HYDROMORPHONE HCL PF 1 MG/ML IJ SOLN
1.0000 mg | Freq: Once | INTRAMUSCULAR | Status: AC
  Administered 2011-09-10: 1 mg via INTRAVENOUS
  Filled 2011-09-10: qty 1

## 2011-09-10 MED ORDER — DIPHENHYDRAMINE HCL 50 MG/ML IJ SOLN
25.0000 mg | Freq: Once | INTRAMUSCULAR | Status: AC
  Administered 2011-09-10: 25 mg via INTRAVENOUS
  Filled 2011-09-10: qty 1

## 2011-09-10 MED ORDER — IBUPROFEN 800 MG PO TABS: 800.0000 mg | ORAL_TABLET | Freq: Three times a day (TID) | ORAL | Status: AC

## 2011-09-10 MED ORDER — ONDANSETRON HCL 4 MG/2ML IJ SOLN
4.0000 mg | Freq: Once | INTRAMUSCULAR | Status: AC
  Administered 2011-09-10: 4 mg via INTRAVENOUS
  Filled 2011-09-10: qty 2

## 2011-09-10 NOTE — ED Provider Notes (Signed)
History     CSN: [id number]  Arrival date & time 09/09/11  2308   First MD Initiated Contact with Patient 09/10/11 0010      Chief Complaint  Patient presents with  . Sickle Cell Pain Crisis    (Consider location/radiation/quality/duration/timing/severity/associated sxs/prior treatment) HPI History by my patient. History of sickle cell disease and presenting with typical sickle cell crisis symptoms in her lower extremities. Pain is throbbing sharp and not radiating from her legs.  She was taking oxycodone at home with no relief of symptoms. No chest pain or shortness of breath. No fevers. No unilateral leg swelling or history of DVT. No trauma. Moderate to severe pain. No known aggravating or alleviating factors. Onset yesterday worsening today. Past Medical History  Diagnosis Date  . PMDD (premenstrual dysphoric disorder)   . Sickle cell anemia   . Sickle cell disease without crisis 07/15/2011  . Migraine with aura 07/15/2011    Past Surgical History  Procedure Date  . Cesarean section     No family history on file.  History  Substance Use Topics  . Smoking status: Never Smoker   . Smokeless tobacco: Not on file  . Alcohol Use: No    OB History    Grav Para Term Preterm Abortions TAB SAB Ect Mult Living                  Review of Systems  Constitutional: Negative for fever and chills.  HENT: Negative for neck pain and neck stiffness.   Eyes: Negative for pain.  Respiratory: Negative for shortness of breath.   Cardiovascular: Negative for chest pain.  Gastrointestinal: Negative for abdominal pain.  Genitourinary: Negative for dysuria.  Musculoskeletal: Negative for back pain.  Skin: Negative for rash.  Neurological: Negative for headaches.  All other systems reviewed and are negative.    Allergies  Sulfa antibiotics  Home Medications   Current Outpatient Rx  Name Route Sig Dispense Refill  . CITALOPRAM HYDROBROMIDE 10 MG PO TABS Oral Take 10 mg by mouth  daily.    . OXYCODONE-ACETAMINOPHEN 5-325 MG PO TABS Oral Take 1 tablet by mouth every 4 (four) hours as needed. For pain    . SUMATRIPTAN SUCCINATE 50 MG PO TABS Oral Take 50 mg by mouth every 2 (two) hours as needed. For migraine      BP 125/80  Pulse 75  Temp(Src) 98.1 F (36.7 C) (Oral)  Resp 16  SpO2 99%  LMP 08/27/2011  Physical Exam  Constitutional: She is oriented to person, place, and time. She appears well-developed and well-nourished.  HENT:  Head: Normocephalic and atraumatic.  Eyes: Conjunctivae and EOM are normal. Pupils are equal, round, and reactive to light.  Neck: Trachea normal. Neck supple. No thyromegaly present.  Cardiovascular: Normal rate, regular rhythm, S1 normal, S2 normal and normal pulses.     No systolic murmur is present   No diastolic murmur is present  Pulses:      Radial pulses are 2+ on the right side, and 2+ on the left side.  Pulmonary/Chest: Effort normal and breath sounds normal. She has no wheezes. She has no rhonchi. She has no rales. She exhibits no tenderness.  Abdominal: Soft. Normal appearance and bowel sounds are normal. There is no tenderness. There is no CVA tenderness and negative Murphy's sign.  Musculoskeletal:       Localizes discomfort to BLE:s without ecchymosis, erythema or appreciable swelling. Calves nontender, no cords or erythema, negative Homans sign. Distal neurovascular  intact.  Neurological: She is alert and oriented to person, place, and time. She has normal strength. No cranial nerve deficit or sensory deficit. GCS eye subscore is 4. GCS verbal subscore is 5. GCS motor subscore is 6.  Skin: Skin is warm and dry. No rash noted. She is not diaphoretic.  Psychiatric: Her speech is normal.       Cooperative and appropriate    ED Course  Procedures (including critical care time)  Labs Reviewed  CBC - Abnormal; Notable for the following:    Hemoglobin 10.2 (*)    HCT 29.8 (*)    MCV 73.9 (*)    MCH 25.3 (*)    RDW  17.5 (*)    Platelets 135 (*)    All other components within normal limits  RETICULOCYTES - Abnormal; Notable for the following:    Retic Ct Pct 5.7 (*)    Retic Count, Manual 229.7 (*)    All other components within normal limits   IV fluids. 2 L oxygen nasal cannula. IV Dilaudid/pain medications. Recheck at 4:30 AM is feeling much better and requesting one more shot of pain medicines before going home. She declines admission. She does have Percocet at home and agrees to close followup as needed with her primary care physician Dr. [name] and as needed with her specialist Dr. Cyndie Chime.    MDM   Sickle cell crisis improved with fluids, oxygen and pain medications as above. No fevers or symptoms of acute chest syndrome. Stable for discharge home and outpatient follow- up.        Cassie Nielsen, MD 09/10/11 [phone]

## 2011-09-10 NOTE — Discharge Instructions (Signed)
Sickle Cell Pain Crisis Sickle cell anemia requires regular medical attention by your healthcare provider and awareness about when to seek medical care. Pain is a common problem in children with sickle cell disease. This usually starts at less than 42 year of age. Pain can occur nearly anywhere in the body but most commonly occurs in the extremities, back, chest, or belly (abdomen). Pain episodes can start suddenly or may follow an illness. These attacks can appear as decreased activity, loss of appetite, change in behavior, or simply complaints of pain. DIAGNOSIS   Specialized blood and gene testing can help make this diagnosis early in the disease. Blood tests may then be done to watch blood levels.   Specialized brain scans are done when there are problems in the brain during a crisis.   Lung testing may be done later in the disease.  HOME CARE INSTRUCTIONS   Maintain good hydration. Increase you or your child's fluid intake in hot weather and during exercise.   Avoid smoking. Smoking lowers the oxygen in the blood and can cause the production of sickle-shaped cells (sickling).   Control pain. Only take over-the-counter or prescription medicines for pain, discomfort, or fever as directed by your caregiver. Do not give aspirin to children because of the association with Reye's syndrome.   Keep regular health care checks to keep a proper red blood cell (hemoglobin) level. A moderate anemia level protects against sickling crises.   You and your child should receive all the same immunizations and care as the people around you.   Mothers should breastfeed their babies if possible. Use formulas with iron added if breastfeeding is not possible. Additional iron should not be given unless there is a lack of it. People with sickle cell disease (SCD) build up iron faster than normal. Give folic acid and additional vitamins as directed.   If you or your child has been prescribed antibiotics or other  medications to prevent problems, take them as directed.   Summer camps are available for children with SCD. They may help young people deal with their disease. The camps introduce them to other children with the same problem.   Young people with SCD may become frustrated or angry at their disease. This can cause rebellion and refusal to follow medical care. Help groups or counseling may help with these problems.   Wear a medical alert bracelet. When traveling, keep medical information, caregiver's names, and the medications you or your child takes with you at all times.  SEEK IMMEDIATE MEDICAL CARE IF:   You or your child develops dizziness or fainting, numbness in or difficulty with movement of arms and legs, difficulty with speech, or is acting abnormally. This could be early signs of a stroke. Immediate treatment is necessary.   You or your child has an oral temperature above 102 F (38.9 C), not controlled by medicine.   You or your child has other signs of infection (chills, lethargy, irritability, poor eating, vomiting). The younger the child, the more you should be concerned.   With fevers, do not give medicine to lower the fever right away. This could cover up a problem that is developing. Notify your caregiver.   You or your child develops pain that is not helped with medicine.   You or your child develops shortness of breath or is coughing up pus-like or bloody sputum.   You or your child develops any problems that are new and are causing you to worry.   You or   your child develops a persistent, often uncomfortable and painful penile erection. This is called priapism. Always check young boys for this. It is often embarrassing for them and they may not bring it to your attention. This is a medical emergency and needs immediate treatment. If this is not treated it will lead to impotence.   You or your child develops a new onset of abdominal pain, especially on the left side near the  stomach area.   You or your child has any questions or has problems that are not getting better. Return immediately if you feel like you are getting worse.

## 2011-09-17 DIAGNOSIS — G43909 Migraine, unspecified, not intractable, without status migrainosus: Secondary | ICD-10-CM | POA: Diagnosis not present

## 2011-09-17 DIAGNOSIS — R51 Headache: Secondary | ICD-10-CM | POA: Diagnosis not present

## 2011-09-20 DIAGNOSIS — R109 Unspecified abdominal pain: Secondary | ICD-10-CM | POA: Diagnosis not present

## 2011-09-20 DIAGNOSIS — E039 Hypothyroidism, unspecified: Secondary | ICD-10-CM | POA: Diagnosis not present

## 2011-09-20 DIAGNOSIS — E559 Vitamin D deficiency, unspecified: Secondary | ICD-10-CM | POA: Diagnosis not present

## 2011-09-20 DIAGNOSIS — E7800 Pure hypercholesterolemia, unspecified: Secondary | ICD-10-CM | POA: Diagnosis not present

## 2011-09-20 DIAGNOSIS — R069 Unspecified abnormalities of breathing: Secondary | ICD-10-CM | POA: Diagnosis not present

## 2011-09-20 DIAGNOSIS — D5720 Sickle-cell/Hb-C disease without crisis: Secondary | ICD-10-CM | POA: Diagnosis not present

## 2011-09-26 ENCOUNTER — Other Ambulatory Visit: Payer: Self-pay | Admitting: *Deleted

## 2011-09-26 ENCOUNTER — Other Ambulatory Visit: Payer: Self-pay | Admitting: Diagnostic Neuroimaging

## 2011-09-26 DIAGNOSIS — D5700 Hb-SS disease with crisis, unspecified: Secondary | ICD-10-CM

## 2011-09-26 DIAGNOSIS — R519 Headache, unspecified: Secondary | ICD-10-CM

## 2011-09-26 DIAGNOSIS — G43909 Migraine, unspecified, not intractable, without status migrainosus: Secondary | ICD-10-CM

## 2011-09-26 MED ORDER — OXYCODONE-ACETAMINOPHEN 5-325 MG PO TABS: ORAL_TABLET | ORAL | Status: DC

## 2011-09-26 NOTE — Telephone Encounter (Signed)
Received call from pt asking for refill on her oxycodone & reports that she is having a problem with her legs.

## 2011-10-01 ENCOUNTER — Inpatient Hospital Stay: Admission: RE | Admit: 2011-10-01 | Payer: Medicare Other | Source: Ambulatory Visit

## 2011-10-09 ENCOUNTER — Other Ambulatory Visit: Payer: Self-pay | Admitting: Internal Medicine

## 2011-10-09 ENCOUNTER — Ambulatory Visit
Admission: RE | Admit: 2011-10-09 | Discharge: 2011-10-09 | Disposition: A | Discharge status: Home / Self Care | Payer: Medicare Other | Source: Ambulatory Visit | Attending: Internal Medicine | Admitting: Internal Medicine

## 2011-10-09 DIAGNOSIS — M25569 Pain in unspecified knee: Secondary | ICD-10-CM

## 2011-10-09 DIAGNOSIS — M25469 Effusion, unspecified knee: Secondary | ICD-10-CM | POA: Diagnosis not present

## 2011-10-09 DIAGNOSIS — D571 Sickle-cell disease without crisis: Secondary | ICD-10-CM | POA: Diagnosis not present

## 2011-10-09 DIAGNOSIS — R0602 Shortness of breath: Secondary | ICD-10-CM | POA: Diagnosis not present

## 2011-10-17 ENCOUNTER — Encounter (HOSPITAL_COMMUNITY): Payer: Self-pay | Admitting: Emergency Medicine

## 2011-10-17 ENCOUNTER — Emergency Department (INDEPENDENT_AMBULATORY_CARE_PROVIDER_SITE_OTHER)
Admission: EM | Admit: 2011-10-17 | Discharge: 2011-10-17 | Disposition: A | Discharge status: Home / Self Care | Payer: Medicare Other | Source: Home / Self Care

## 2011-10-17 DIAGNOSIS — R079 Chest pain, unspecified: Secondary | ICD-10-CM

## 2011-10-17 DIAGNOSIS — M25562 Pain in left knee: Secondary | ICD-10-CM

## 2011-10-17 DIAGNOSIS — D571 Sickle-cell disease without crisis: Secondary | ICD-10-CM

## 2011-10-17 DIAGNOSIS — R05 Cough: Secondary | ICD-10-CM | POA: Diagnosis not present

## 2011-10-17 DIAGNOSIS — M25569 Pain in unspecified knee: Secondary | ICD-10-CM

## 2011-10-17 NOTE — ED Notes (Signed)
Multiple complaints.  Reports fluid build up on knee.  Patient reports chest soreness with coughing, also reports epigastric soreness with coughing. Onset of complaints last night.

## 2011-10-17 NOTE — ED Provider Notes (Signed)
Cassie Montgomery is a 41 y.o. female with sickle cell disease who presents to Urgent Care today for   1) cough and chest pain. Symptoms started 2 nights ago. Patient notes chest tightness with a productive cough.  Cough is productive of green sputum.  She denies any shortness of breath. She states that this is not typical for pain crisis.   She denies any radiating pain or palpitations.  She denies any fevers or chills. She denies any sick contacts  2) left knee pain: Present off and on for several months. Currently her left knee hurts and has an effusion.  Typically the knee effusion is managed with aspiration and injection.  Additionally she says this pain is not typical for sickle pain. She denies any trouble walking or fever   PMH reviewed. Sickle cell disease Cassie Montgomery variant History  Substance Use Topics  . Smoking status: Never Smoker   . Smokeless tobacco: Not on file  . Alcohol Use: No   ROS as above Medications reviewed. No current facility-administered medications for this encounter.   Current Outpatient Prescriptions  Medication Sig Dispense Refill  . citalopram (CELEXA) 10 MG tablet Take 10 mg by mouth daily.      Marland Kitchen oxyCODONE-acetaminophen (PERCOCET) 5-325 MG per tablet 1-2 tabs po q 4h prn pain  100 tablet  0  . SUMAtriptan (IMITREX) 50 MG tablet Take 50 mg by mouth every 2 (two) hours as needed. For migraine        Exam:  BP 134/87  Pulse 106  Temp(Src) 97.6 F (36.4 C) (Oral)  Resp 22  SpO2 100% Gen: Well NAD HEENT: EOMI,  MMM Lungs: CTABL Nl WOB Heart: RRR no MRG Abd: NABS, NT, ND Exts: Non edematous BL  LE, warm and well perfused.  Left knee: Moderate effusion. Tender throughout.  No results found for this or any previous visit (from the past 24 hour(s)). No results found.  Assessment and Plan: 41 y.o. female with sickle cell disease and cough with chest pain.  Likely URI however given the nature of her chronic disease I feel that she needs more for workup and  we are able to safely provide an urgent care.  She will likely need chest x-ray and at least CBC.    Additionally she has a left knee effusion. This is also not typical for sickle cell pain for this patient. This is likely arthritis and can be managed by her primary care doctor.     Rodolph Bong, MD 10/17/11 2045  Addendum: Of note he attempted to transfer patient to the emergency room via shuttle.  She would like to take her children home and will leave by private car.  I emphasized the importance of quickly going to the emergency room as she may have a life-threatening condition. She expresses understanding  Rodolph Bong, MD 10/17/11 2046

## 2011-10-17 NOTE — ED Provider Notes (Signed)
Medical screening examination/treatment/procedure(s) were performed by non-physician practitioner and as supervising physician I was immediately available for consultation/collaboration.  Raynald Blend, MD 10/17/11 2053

## 2011-10-17 NOTE — Discharge Instructions (Signed)
Thank you for coming in today. Please go immediately to the emergency room

## 2011-10-17 NOTE — ED Notes (Signed)
Patient instructed to go to the ed by dr Denyse Amass.  Patient needing to take children home.  Dr Denyse Amass had discussion with patient and agreeable to patient taking children home, then returning to [facility] for evaluation.

## 2011-10-22 ENCOUNTER — Other Ambulatory Visit: Payer: Self-pay | Admitting: *Deleted

## 2011-10-22 DIAGNOSIS — D5700 Hb-SS disease with crisis, unspecified: Secondary | ICD-10-CM

## 2011-10-22 MED ORDER — OXYCODONE-ACETAMINOPHEN 5-325 MG PO TABS: ORAL_TABLET | ORAL | Status: DC

## 2011-10-22 NOTE — Telephone Encounter (Signed)
Pt. Called for refill on her pain med.  Pt notified to p/u this eve or in am.

## 2011-11-13 ENCOUNTER — Other Ambulatory Visit: Payer: Self-pay | Admitting: *Deleted

## 2011-11-13 DIAGNOSIS — D5700 Hb-SS disease with crisis, unspecified: Secondary | ICD-10-CM

## 2011-11-13 MED ORDER — OXYCODONE-ACETAMINOPHEN 5-325 MG PO TABS: ORAL_TABLET | ORAL | Status: DC

## 2011-11-13 NOTE — Telephone Encounter (Signed)
Pt called requesting refill on her pain med.  She reports that she has some left but doesn't want to run out.  She has been taking more recently due to increased pain.  She reports that the med does help her pain.  She is OK to pick up med on Friday 11/15/11.

## 2011-12-03 ENCOUNTER — Telehealth (HOSPITAL_COMMUNITY): Payer: Self-pay | Admitting: Hematology

## 2011-12-04 ENCOUNTER — Encounter (HOSPITAL_COMMUNITY): Payer: Self-pay | Admitting: Hematology

## 2011-12-04 ENCOUNTER — Non-Acute Institutional Stay (HOSPITAL_COMMUNITY)
Admission: AD | Admit: 2011-12-04 | Discharge: 2011-12-05 | Disposition: A | Discharge status: Home / Self Care | Payer: Medicare Other | Attending: Internal Medicine | Admitting: Internal Medicine

## 2011-12-04 DIAGNOSIS — M7989 Other specified soft tissue disorders: Secondary | ICD-10-CM | POA: Insufficient documentation

## 2011-12-04 DIAGNOSIS — E876 Hypokalemia: Secondary | ICD-10-CM | POA: Insufficient documentation

## 2011-12-04 DIAGNOSIS — M79609 Pain in unspecified limb: Secondary | ICD-10-CM | POA: Insufficient documentation

## 2011-12-04 DIAGNOSIS — D571 Sickle-cell disease without crisis: Secondary | ICD-10-CM

## 2011-12-04 DIAGNOSIS — J9801 Acute bronchospasm: Secondary | ICD-10-CM | POA: Insufficient documentation

## 2011-12-04 DIAGNOSIS — D5700 Hb-SS disease with crisis, unspecified: Secondary | ICD-10-CM | POA: Insufficient documentation

## 2011-12-04 LAB — CBC WITH DIFFERENTIAL/PLATELET
Eosinophils Relative: 4 % (ref 0–5)
HCT: 23.6 % — ABNORMAL LOW (ref 36.0–46.0)
Hemoglobin: 8 g/dL — ABNORMAL LOW (ref 12.0–15.0)
Lymphocytes Relative: 26 % (ref 12–46)
Lymphs Abs: 1.6 10*3/uL (ref 0.7–4.0)
MCV: 74.2 fL — ABNORMAL LOW (ref 78.0–100.0)
Monocytes Absolute: 0.4 10*3/uL (ref 0.1–1.0)
Monocytes Relative: 6 % (ref 3–12)
RBC: 3.18 MIL/uL — ABNORMAL LOW (ref 3.87–5.11)
RDW: 17.5 % — ABNORMAL HIGH (ref 11.5–15.5)
WBC: 6.1 10*3/uL (ref 4.0–10.5)

## 2011-12-04 LAB — COMPREHENSIVE METABOLIC PANEL
Alkaline Phosphatase: 52 U/L (ref 39–117)
BUN: 7 mg/dL (ref 6–23)
CO2: 31 mEq/L (ref 19–32)
Chloride: 101 mEq/L (ref 96–112)
Creatinine, Ser: 0.75 mg/dL (ref 0.50–1.10)
GFR calc non Af Amer: >90 mL/min (ref >90)
Glucose, Bld: 91 mg/dL (ref 70–99)
Total Bilirubin: 3 mg/dL — ABNORMAL HIGH (ref 0.3–1.2)

## 2011-12-04 MED ORDER — POTASSIUM CHLORIDE CRYS ER 20 MEQ PO TBCR
40.0000 meq | EXTENDED_RELEASE_TABLET | Freq: Two times a day (BID) | ORAL | Status: DC
  Administered 2011-12-04: 40 meq via ORAL
  Filled 2011-12-04: qty 2

## 2011-12-04 MED ORDER — HYDROMORPHONE HCL PF 2 MG/ML IJ SOLN
2.0000 mg | INTRAMUSCULAR | Status: DC | PRN
  Administered 2011-12-04 – 2011-12-05 (×6): 4 mg via INTRAVENOUS
  Filled 2011-12-04 (×6): qty 2

## 2011-12-04 MED ORDER — ONDANSETRON HCL 4 MG/2ML IJ SOLN
4.0000 mg | INTRAMUSCULAR | Status: DC | PRN
  Administered 2011-12-04 – 2011-12-05 (×3): 4 mg via INTRAVENOUS
  Filled 2011-12-04 (×3): qty 2

## 2011-12-04 MED ORDER — POTASSIUM CHLORIDE IN NACL 20-0.45 MEQ/L-% IV SOLN
INTRAVENOUS | Status: DC
  Administered 2011-12-04: 22:00:00 via INTRAVENOUS
  Filled 2011-12-04 (×2): qty 1000

## 2011-12-04 MED ORDER — DIPHENHYDRAMINE HCL 50 MG/ML IJ SOLN
12.5000 mg | INTRAMUSCULAR | Status: DC | PRN
  Administered 2011-12-04 – 2011-12-05 (×3): 25 mg via INTRAVENOUS
  Filled 2011-12-04 (×3): qty 1

## 2011-12-04 MED ORDER — DIPHENHYDRAMINE HCL 25 MG PO CAPS: 25.0000 mg | ORAL_CAPSULE | ORAL | Status: DC | PRN

## 2011-12-04 MED ORDER — DEXTROSE-NACL 5-0.45 % IV SOLN
INTRAVENOUS | Status: DC
  Administered 2011-12-04: 19:00:00 via INTRAVENOUS

## 2011-12-04 MED ORDER — ONDANSETRON HCL 4 MG PO TABS: 4.0000 mg | ORAL_TABLET | ORAL | Status: DC | PRN

## 2011-12-04 MED ORDER — SUMATRIPTAN SUCCINATE 50 MG PO TABS
50.0000 mg | ORAL_TABLET | ORAL | Status: DC | PRN
  Filled 2011-12-04: qty 1

## 2011-12-04 MED ORDER — CITALOPRAM HYDROBROMIDE 10 MG PO TABS
10.0000 mg | ORAL_TABLET | Freq: Every day | ORAL | Status: DC
  Administered 2011-12-04: 10 mg via ORAL
  Filled 2011-12-04 (×2): qty 1

## 2011-12-04 MED ORDER — FOLIC ACID 1 MG PO TABS: 1.0000 mg | ORAL_TABLET | Freq: Every day | ORAL | Status: DC

## 2011-12-04 NOTE — H&P (Signed)
Patient ID: Madge Therrien, female   DOB: 1971/02/03, 41 y.o.   MRN: [id number]  Chief Complaint  Patient presents with  . Sickle Cell Pain Crisis    HPI Jillyn Hoffart is a 41 y.o. female.  Patient comes at office complaining of a several day history of increasing leg pain progressive weakness with nausea and diarrhea. She had been under recent increased stress with church activities. She has not been resting as well. She been on feet much more than usual. She began develop increasing pain in the legs. There was no associated shortness of breath. States she went to church and had members to prior on her leg which she felt helped. As she walked however she noted increasing pain and decided to come to the Center for further treatment. She been taking her oxycodone pain medication on regular basis as well. Patient denies fever chills or night sweats. No significant cough.  Past Medical History  Diagnosis Date  . PMDD (premenstrual dysphoric disorder)   . Sickle cell anemia   . Sickle cell disease without crisis 07/15/2011  . Migraine with aura 07/15/2011    Past Surgical History  Procedure Date  . Cesarean section     No family history on file.  Social History History  Substance Use Topics  . Smoking status: Never Smoker   . Smokeless tobacco: Not on file  . Alcohol Use: No    Allergies  Allergen Reactions  . Sulfa Antibiotics Other (See Comments)    Increased bilirubin    Current Facility-Administered Medications  Medication Dose Route Frequency Provider Last Rate Last Dose  . dextrose 5 %-0.45 % sodium chloride infusion   Intravenous Continuous Gwenyth Bender, MD 125 mL/hr at 12/04/11 1900    . diphenhydrAMINE (BENADRYL) capsule 25-50 mg  25-50 mg Oral Q4H PRN Gwenyth Bender, MD       Or  . diphenhydrAMINE (BENADRYL) injection 12.5-25 mg  12.5-25 mg Intravenous Q4H PRN Gwenyth Bender, MD   25 mg at 12/04/11 1906  . folic acid (FOLVITE) tablet 1 mg  1 mg Oral Daily Gwenyth Bender, MD        . HYDROmorphone (DILAUDID) injection 2-4 mg  2-4 mg Intravenous Q2H PRN Gwenyth Bender, MD   4 mg at 12/04/11 1910  . ondansetron (ZOFRAN) tablet 4 mg  4 mg Oral Q4H PRN Gwenyth Bender, MD       Or  . ondansetron [facility]) injection 4 mg  4 mg Intravenous Q4H PRN Gwenyth Bender, MD   4 mg at 12/04/11 1907    Review of Systems As noted above. Blood pressure 134/94, pulse 89, temperature 98.7 F (37.1 C), temperature source Oral, resp. rate 20, last menstrual period 11/26/2011, SpO2 100.00%.  Physical Exam Well-developed obese black female in mild distress. Presently rating her pain as a 7/10. Now from 9/10. HEENT: Head normocephalic atraumatic. No sinus tenderness. Positive sclera icterus. TMs are clear. Throat with minimal posterior pharyngeal erythema. NECK: Thyroid gland palpable. No nodules. No posterior cervical nodes. LUNGS: Clear to auscultation. She has pain Wheezes bilaterally. No vocal fremitus. CV: Normal S1, S2 without S3. No murmurs or rubs. ABDOMEN: Distended. Hypoactive bowel sounds. No tenderness. Tympanic to percussion. EXTREMITIES: Left leg notable for tenderness with equivocal Homans. Tenderness to palpation no posterior lower leg. No definite cords appreciated. No increased warmth. Right leg is intact. NEURO: Intact. Data Reviewed  Results for orders placed during the hospital encounter of 12/04/11 (from the past 48  hour(s))  CBC WITH DIFFERENTIAL     Status: Abnormal (Preliminary result)   Collection Time   12/04/11  6:47 PM      Component Value Range Comment   WBC 6.1  4.0 - 10.5 K/uL    RBC 3.18 (*) 3.87 - 5.11 MIL/uL    Hemoglobin 8.0 (*) 12.0 - 15.0 g/dL    HCT 98.1 (*) 19.1 - 46.0 %    MCV 74.2 (*) 78.0 - 100.0 fL    MCH 25.2 (*) 26.0 - 34.0 pg    MCHC 33.9  30.0 - 36.0 g/dL    RDW 47.8 (*) 29.5 - 15.5 %    Platelets PENDING  150 - 400 K/uL    Neutrophils Relative 64  43 - 77 %    Neutro Abs 3.9  1.7 - 7.7 K/uL    Lymphocytes Relative 26  12 - 46 %    Lymphs  Abs 1.6  0.7 - 4.0 K/uL    Monocytes Relative 6  3 - 12 %    Monocytes Absolute 0.4  0.1 - 1.0 K/uL    Eosinophils Relative 4  0 - 5 %    Eosinophils Absolute 0.2  0.0 - 0.7 K/uL    Basophils Relative 0  0 - 1 %    Basophils Absolute 0.0  0.0 - 0.1 K/uL   COMPREHENSIVE METABOLIC PANEL     Status: Abnormal   Collection Time   12/04/11  7:00 PM      Component Value Range Comment   Sodium 139  135 - 145 mEq/L    Potassium 3.2 (*) 3.5 - 5.1 mEq/L    Chloride 101  96 - 112 mEq/L    CO2 31  19 - 32 mEq/L    Glucose, Bld 91  70 - 99 mg/dL    BUN 7  6 - 23 mg/dL    Creatinine, Ser 6.21  0.50 - 1.10 mg/dL    Calcium 8.5  8.4 - 30.8 mg/dL    Total Protein 6.4  6.0 - 8.3 g/dL    Albumin 3.5  3.5 - 5.2 g/dL    AST 19  0 - 37 U/L    ALT 13  0 - 35 U/L    Alkaline Phosphatase 52  39 - 117 U/L    Total Bilirubin 3.0 (*) 0.3 - 1.2 mg/dL    GFR calc non Af Amer >90  >90 mL/min    GFR calc Af Amer >90  >90 mL/min      Assessment    Sickle cell crisis. Hypokalemia. Active hemolysis. Left leg pain and swelling. Rule out DVT. Previously evaluated. Situational stress. Mild bronchospasm. Distant history of asthma.    Plan    IV fluids for hydration. Replenish potassium orally as well with IV fluid additive. IV Dilaudid 2-4 mg every 2 hours when necessary. IV Zofran for nausea as needed. Scheduled baseline venous Doppler of the leg. Xopenex nebulizer every 6 hours when necessary. Incentive spirometry. Followup CBC in [facility] in a.m.       Hermila Millis 12/04/2011, 8:04 PM

## 2011-12-05 DIAGNOSIS — D571 Sickle-cell disease without crisis: Secondary | ICD-10-CM | POA: Diagnosis not present

## 2011-12-05 MED ORDER — LEVALBUTEROL HCL 1.25 MG/0.5ML IN NEBU
1.2500 mg | INHALATION_SOLUTION | Freq: Four times a day (QID) | RESPIRATORY_TRACT | Status: DC | PRN
  Administered 2011-12-05: 1.25 mg via RESPIRATORY_TRACT
  Filled 2011-12-05: qty 0.5

## 2011-12-05 NOTE — Progress Notes (Signed)
Patient admitted for sickle cell crisis, with leg swelling and pain.  She was getting nebulizer treatments as well and was to get Bilateral lower ext [place] as per Dr Diamantina Providence admit note.  She has a 42 yo son who can get to school via the bus, but she could not get child care for her 2 yo daughter, so she has to leave AMA.  She will make arrangement for follow up with Dr [name] ASAP.   She is thus discharged AMA.

## 2011-12-05 NOTE — Progress Notes (Signed)
Subjective: Pt notified RN's that she wished to go home at approx 0400. [facility] ago for increasing bil leg pain, weakness, nausea and diarrhea. Dr [name] noted bil wheezes upon admission and planned to treat ("Mild bronchospasm") w/ PRN Xopenex nebs and incentive spirometry. There was also a scheduled doppler study ordered today to r/o DVT of BLE. Dr Conley Rolls in to speak w/ pt to encourage her to stay until seen by Dr [name] this am. Pt relayed to Dr Conley Rolls that she has a 63 yr old child and is having childcare problems. Pt agreed to attempt to make arrangements that would allow her to stay until later this am. Dr Conley Rolls informed pt that if she left she would have to sign an AMA form. Pt has agreed. Objective:   Pt noted AA&O x 3 and currently in NAD. Most recent VS 136/77, P-97, R-18 w/ 02 sats of 98% on R/A. There was improvement in wheezing w/ most recent Xopenex neb as pt's BBS CTA at this time. BLE pain currently 6/10. Assessment/Plan: Pt notified RN's at 0630 that she will have to leave [facility]. She has been unable to make arrangements for her small child and is agreeable w/ signing AMA agreement. Pt has agreed to call Dr Diamantina Providence office later this am to arrange ourtpt f/u.

## 2011-12-06 ENCOUNTER — Encounter (HOSPITAL_COMMUNITY): Payer: Self-pay | Admitting: Emergency Medicine

## 2011-12-06 ENCOUNTER — Inpatient Hospital Stay (HOSPITAL_COMMUNITY)
Admission: EM | Admit: 2011-12-06 | Discharge: 2011-12-10 | DRG: 812 | Disposition: A | Discharge status: Home / Self Care | Payer: Medicare Other | Attending: Internal Medicine | Admitting: Internal Medicine

## 2011-12-06 ENCOUNTER — Emergency Department (HOSPITAL_COMMUNITY): Payer: Medicare Other

## 2011-12-06 DIAGNOSIS — E876 Hypokalemia: Secondary | ICD-10-CM | POA: Diagnosis not present

## 2011-12-06 DIAGNOSIS — D5700 Hb-SS disease with crisis, unspecified: Secondary | ICD-10-CM | POA: Diagnosis not present

## 2011-12-06 DIAGNOSIS — M79609 Pain in unspecified limb: Secondary | ICD-10-CM | POA: Diagnosis present

## 2011-12-06 DIAGNOSIS — D696 Thrombocytopenia, unspecified: Secondary | ICD-10-CM | POA: Diagnosis present

## 2011-12-06 DIAGNOSIS — M25569 Pain in unspecified knee: Secondary | ICD-10-CM | POA: Diagnosis not present

## 2011-12-06 DIAGNOSIS — D649 Anemia, unspecified: Secondary | ICD-10-CM | POA: Diagnosis not present

## 2011-12-06 DIAGNOSIS — M79606 Pain in leg, unspecified: Secondary | ICD-10-CM

## 2011-12-06 DIAGNOSIS — M25469 Effusion, unspecified knee: Secondary | ICD-10-CM | POA: Diagnosis not present

## 2011-12-06 DIAGNOSIS — M25559 Pain in unspecified hip: Secondary | ICD-10-CM | POA: Diagnosis not present

## 2011-12-06 LAB — URINALYSIS, ROUTINE W REFLEX MICROSCOPIC
Hgb urine dipstick: NEGATIVE
Ketones, ur: NEGATIVE mg/dL
Leukocytes, UA: NEGATIVE
Protein, ur: 30 mg/dL — AB
Urobilinogen, UA: 1 mg/dL (ref 0.0–1.0)

## 2011-12-06 LAB — CBC WITH DIFFERENTIAL/PLATELET
Basophils Absolute: 0 10*3/uL (ref 0.0–0.1)
Basophils Relative: 0 % (ref 0–1)
HCT: 20.5 % — ABNORMAL LOW (ref 36.0–46.0)
Hemoglobin: 6.9 g/dL — CL (ref 12.0–15.0)
Lymphocytes Relative: 20 % (ref 12–46)
Lymphs Abs: 1.4 10*3/uL (ref 0.7–4.0)
MCV: 76.5 fL — ABNORMAL LOW (ref 78.0–100.0)
Monocytes Relative: 8 % (ref 3–12)
Neutro Abs: 4.9 10*3/uL (ref 1.7–7.7)
RDW: 18.8 % — ABNORMAL HIGH (ref 11.5–15.5)
WBC: 7.2 10*3/uL (ref 4.0–10.5)

## 2011-12-06 LAB — BASIC METABOLIC PANEL
BUN: 5 mg/dL — ABNORMAL LOW (ref 6–23)
CO2: 30 mEq/L (ref 19–32)
Chloride: 99 mEq/L (ref 96–112)
Creatinine, Ser: 0.65 mg/dL (ref 0.50–1.10)
GFR calc Af Amer: >90 mL/min (ref >90)
Glucose, Bld: 101 mg/dL — ABNORMAL HIGH (ref 70–99)
Potassium: 3.2 mEq/L — ABNORMAL LOW (ref 3.5–5.1)

## 2011-12-06 LAB — URINE MICROSCOPIC-ADD ON

## 2011-12-06 LAB — LACTATE DEHYDROGENASE: LDH: 355 U/L — ABNORMAL HIGH (ref 94–250)

## 2011-12-06 MED ORDER — SODIUM CHLORIDE 0.9 % IV SOLN: Freq: Once | INTRAVENOUS | Status: DC

## 2011-12-06 MED ORDER — SODIUM CHLORIDE 0.9 % IV BOLUS (SEPSIS)
1000.0000 mL | Freq: Once | INTRAVENOUS | Status: AC
  Administered 2011-12-06: 1000 mL via INTRAVENOUS

## 2011-12-06 MED ORDER — HYDROMORPHONE HCL PF 1 MG/ML IJ SOLN: 1.0000 mg | Freq: Once | INTRAMUSCULAR | Status: DC

## 2011-12-06 MED ORDER — HYDROMORPHONE HCL PF 2 MG/ML IJ SOLN
2.0000 mg | INTRAMUSCULAR | Status: DC | PRN
  Administered 2011-12-07: 2 mg via INTRAVENOUS
  Filled 2011-12-06: qty 1

## 2011-12-06 MED ORDER — DIPHENHYDRAMINE HCL 50 MG/ML IJ SOLN
25.0000 mg | Freq: Once | INTRAMUSCULAR | Status: AC
  Administered 2011-12-07: 25 mg via INTRAVENOUS
  Filled 2011-12-06: qty 1

## 2011-12-06 NOTE — ED Notes (Signed)
Lab called to report critical Hgb 6.9.

## 2011-12-06 NOTE — ED Notes (Signed)
Pt reports pain is in her left leg radiating towards foot.  Pain is a throbbing, shooting pain.  Pt reports she has been wheezing and coughing for the past 3 days also.

## 2011-12-06 NOTE — ED Notes (Signed)
Pt is here for sickle cell pain  Pt states pain is in her left leg  Pt states she was here the other night and was sent to the clinic but she had to leave to take care of her children  Pt states she spoke to Dr [name] and was told to come here for evaluation

## 2011-12-07 DIAGNOSIS — D696 Thrombocytopenia, unspecified: Secondary | ICD-10-CM | POA: Diagnosis present

## 2011-12-07 DIAGNOSIS — K5900 Constipation, unspecified: Secondary | ICD-10-CM | POA: Diagnosis not present

## 2011-12-07 DIAGNOSIS — D5700 Hb-SS disease with crisis, unspecified: Secondary | ICD-10-CM | POA: Diagnosis not present

## 2011-12-07 DIAGNOSIS — M79609 Pain in unspecified limb: Secondary | ICD-10-CM | POA: Diagnosis not present

## 2011-12-07 DIAGNOSIS — J45909 Unspecified asthma, uncomplicated: Secondary | ICD-10-CM | POA: Diagnosis not present

## 2011-12-07 DIAGNOSIS — M79606 Pain in leg, unspecified: Secondary | ICD-10-CM | POA: Diagnosis present

## 2011-12-07 DIAGNOSIS — D649 Anemia, unspecified: Secondary | ICD-10-CM | POA: Diagnosis present

## 2011-12-07 DIAGNOSIS — E876 Hypokalemia: Secondary | ICD-10-CM | POA: Diagnosis present

## 2011-12-07 DIAGNOSIS — M7989 Other specified soft tissue disorders: Secondary | ICD-10-CM | POA: Diagnosis not present

## 2011-12-07 LAB — BASIC METABOLIC PANEL
BUN: 4 mg/dL — ABNORMAL LOW (ref 6–23)
Chloride: 102 mEq/L (ref 96–112)
Glucose, Bld: 106 mg/dL — ABNORMAL HIGH (ref 70–99)
Potassium: 3.3 mEq/L — ABNORMAL LOW (ref 3.5–5.1)

## 2011-12-07 LAB — CBC
HCT: 22.3 % — ABNORMAL LOW (ref 36.0–46.0)
Hemoglobin: 7.4 g/dL — ABNORMAL LOW (ref 12.0–15.0)
MCH: 25.7 pg — ABNORMAL LOW (ref 26.0–34.0)
MCHC: 33.2 g/dL (ref 30.0–36.0)

## 2011-12-07 LAB — PREPARE RBC (CROSSMATCH)

## 2011-12-07 MED ORDER — POTASSIUM CHLORIDE CRYS ER 20 MEQ PO TBCR
40.0000 meq | EXTENDED_RELEASE_TABLET | Freq: Once | ORAL | Status: AC
  Administered 2011-12-07: 40 meq via ORAL
  Filled 2011-12-07: qty 2

## 2011-12-07 MED ORDER — FOLIC ACID 1 MG PO TABS
1.0000 mg | ORAL_TABLET | Freq: Every day | ORAL | Status: DC
  Administered 2011-12-07 – 2011-12-10 (×4): 1 mg via ORAL
  Filled 2011-12-07 (×4): qty 1

## 2011-12-07 MED ORDER — DOCUSATE SODIUM 100 MG PO CAPS
100.0000 mg | ORAL_CAPSULE | Freq: Two times a day (BID) | ORAL | Status: DC
  Administered 2011-12-07 – 2011-12-10 (×7): 100 mg via ORAL
  Filled 2011-12-07 (×8): qty 1

## 2011-12-07 MED ORDER — PROMETHAZINE HCL 25 MG RE SUPP: 12.5000 mg | RECTAL | Status: DC | PRN

## 2011-12-07 MED ORDER — SENNA 8.6 MG PO TABS
1.0000 | ORAL_TABLET | Freq: Two times a day (BID) | ORAL | Status: DC
  Administered 2011-12-07 – 2011-12-10 (×7): 8.6 mg via ORAL
  Filled 2011-12-07 (×7): qty 1

## 2011-12-07 MED ORDER — CITALOPRAM HYDROBROMIDE 10 MG PO TABS
10.0000 mg | ORAL_TABLET | Freq: Every day | ORAL | Status: DC
  Administered 2011-12-07 – 2011-12-10 (×4): 10 mg via ORAL
  Filled 2011-12-07 (×4): qty 1

## 2011-12-07 MED ORDER — SODIUM CHLORIDE 0.9 % IV SOLN: INTRAVENOUS | Status: AC

## 2011-12-07 MED ORDER — FOLIC ACID 1 MG PO TABS: 1.0000 mg | ORAL_TABLET | Freq: Every day | ORAL | Status: DC

## 2011-12-07 MED ORDER — PROMETHAZINE HCL 25 MG PO TABS
12.5000 mg | ORAL_TABLET | ORAL | Status: DC | PRN
  Administered 2011-12-09 (×2): 25 mg via ORAL
  Filled 2011-12-07 (×4): qty 1

## 2011-12-07 MED ORDER — HYDROMORPHONE HCL PF 2 MG/ML IJ SOLN
2.0000 mg | INTRAMUSCULAR | Status: DC | PRN
  Administered 2011-12-07 – 2011-12-09 (×18): 4 mg via INTRAVENOUS
  Filled 2011-12-07 (×2): qty 2
  Filled 2011-12-07: qty 1
  Filled 2011-12-07 (×12): qty 2
  Filled 2011-12-07: qty 1
  Filled 2011-12-07 (×3): qty 2

## 2011-12-07 MED ORDER — ACETAMINOPHEN 650 MG RE SUPP: 650.0000 mg | RECTAL | Status: DC | PRN

## 2011-12-07 MED ORDER — ALBUTEROL SULFATE HFA 108 (90 BASE) MCG/ACT IN AERS
2.0000 | INHALATION_SPRAY | Freq: Four times a day (QID) | RESPIRATORY_TRACT | Status: AC
  Administered 2011-12-07 – 2011-12-09 (×8): 2 via RESPIRATORY_TRACT
  Filled 2011-12-07: qty 6.7

## 2011-12-07 MED ORDER — HYDROMORPHONE HCL PF 2 MG/ML IJ SOLN
2.0000 mg | Freq: Once | INTRAMUSCULAR | Status: AC
  Administered 2011-12-07: 2 mg via INTRAVENOUS
  Filled 2011-12-07: qty 1

## 2011-12-07 MED ORDER — DIPHENHYDRAMINE HCL 25 MG PO CAPS: 25.0000 mg | ORAL_CAPSULE | ORAL | Status: DC | PRN

## 2011-12-07 MED ORDER — DIPHENHYDRAMINE HCL 50 MG/ML IJ SOLN
12.5000 mg | INTRAMUSCULAR | Status: DC | PRN
  Administered 2011-12-07 – 2011-12-10 (×14): 25 mg via INTRAVENOUS
  Filled 2011-12-07 (×14): qty 1

## 2011-12-07 MED ORDER — ACETAMINOPHEN 325 MG PO TABS: 650.0000 mg | ORAL_TABLET | ORAL | Status: DC | PRN

## 2011-12-07 MED ORDER — POTASSIUM CHLORIDE IN NACL 20-0.9 MEQ/L-% IV SOLN
INTRAVENOUS | Status: DC
  Administered 2011-12-07 – 2011-12-09 (×4): via INTRAVENOUS
  Filled 2011-12-07 (×6): qty 1000

## 2011-12-07 MED ORDER — FUROSEMIDE 10 MG/ML IJ SOLN
20.0000 mg | Freq: Once | INTRAMUSCULAR | Status: AC
  Administered 2011-12-07: 20 mg via INTRAVENOUS
  Filled 2011-12-07 (×2): qty 2

## 2011-12-07 MED ORDER — KETOROLAC TROMETHAMINE 30 MG/ML IJ SOLN
30.0000 mg | Freq: Four times a day (QID) | INTRAMUSCULAR | Status: DC
  Administered 2011-12-08 – 2011-12-10 (×9): 30 mg via INTRAVENOUS
  Filled 2011-12-07 (×11): qty 1

## 2011-12-07 NOTE — Progress Notes (Signed)
Subjective:  + wheezing. H/O asthma in past.   Objective:  Vital Signs in the last 24 hours: Temp:  [98.5 F (36.9 C)-99.5 F (37.5 C)] 98.9 F (37.2 C) (07/27 0957) Pulse Rate:  [74-101] 92  (07/27 0957) Cardiac Rhythm:  [-]  Resp:  [16-20] 18  (07/27 0957) BP: (111-137)/(32-98) 128/87 mmHg (07/27 0957) SpO2:  [97 %-100 %] 97 % (07/27 0957) Weight:  [109.5 kg (241 lb 6.5 oz)] 109.5 kg (241 lb 6.5 oz) (07/27 0316)  Physical Exam: BP Readings from Last 1 Encounters:  12/07/11 128/87    Wt Readings from Last 1 Encounters:  12/07/11 109.5 kg (241 lb 6.5 oz)    Weight change:   HEENT: [place]/AT, Eyes-Brown, PERL, EOMI, Conjunctiva-Pale, Sclera-Non-icteric Neck: No JVD, No bruit, Trachea midline. Lungs:  Forced expiratory wheeze, Bilateral. Cardiac:  Regular rhythm, normal S1 and S2, no S3.  Abdomen:  Soft, non-tender. Extremities:  Trace edema present. No cyanosis. No clubbing. CNS: AxOx3, Cranial nerves grossly intact, moves all 4 extremities. Right handed. Skin: Warm and dry.   Intake/Output from previous day: 07/26 0701 - 07/27 0700 In: 562.5 [I.V.:250; Blood:312.5] Out: -     Lab Results: BMET    Component Value Date/Time   NA 138 12/07/2011 0841   K 3.3* 12/07/2011 0841   CL 102 12/07/2011 0841   CO2 30 12/07/2011 0841   GLUCOSE 106* 12/07/2011 0841   BUN 4* 12/07/2011 0841   CREATININE 0.65 12/07/2011 0841   CALCIUM 8.4 12/07/2011 0841   GFRNONAA >90 12/07/2011 0841   GFRAA >90 12/07/2011 0841   CBC    Component Value Date/Time   WBC 6.8 12/07/2011 0841   WBC 7.3 07/15/2011 1048   RBC 2.88* 12/07/2011 0841   RBC 4.25 07/15/2011 1048   HGB 7.4* 12/07/2011 0841   HGB 10.9* 07/15/2011 1048   HCT 22.3* 12/07/2011 0841   HCT 30.8* 07/15/2011 1048   PLT 73* 12/07/2011 0841   PLT 156 07/15/2011 1048   MCV 77.4* 12/07/2011 0841   MCV 72.5* 07/15/2011 1048   MCH 25.7* 12/07/2011 0841   MCH 25.6 07/15/2011 1048   MCHC 33.2 12/07/2011 0841   MCHC 35.4 07/15/2011 1048   RDW 18.5* 12/07/2011  0841   RDW 16.4* 07/15/2011 1048   LYMPHSABS 1.4 12/06/2011 2240   LYMPHSABS 1.9 07/15/2011 1048   MONOABS 0.6 12/06/2011 2240   MONOABS 0.4 07/15/2011 1048   EOSABS 0.3 12/06/2011 2240   EOSABS 0.1 07/15/2011 1048   BASOSABS 0.0 12/06/2011 2240   BASOSABS 0.0 07/15/2011 1048   CARDIAC ENZYMES No results found for this basename: CKTOTAL, CKMB, CKMBINDEX, TROPONINI    Assessment/Plan:  Patient Active Hospital Problem List: Sickle cell crisis (12/07/2011) Anemia (12/07/2011) Leg pain (12/07/2011) Hypokalemia (12/07/2011) Thrombocytopenia (12/07/2011)  Asthma  Resume albuterol.   LOS: 1 day    Orpah Cobb  MD  12/07/2011, 11:22 AM

## 2011-12-07 NOTE — ED Provider Notes (Addendum)
History     CSN: [id number]  Arrival date & time 12/06/11  2014   First MD Initiated Contact with Patient 12/06/11 2224      Chief Complaint  Patient presents with  . Sickle Cell Pain Crisis    (Consider location/radiation/quality/duration/timing/severity/associated sxs/prior treatment) HPI Comments: Patient with hx of Sickle cell anemia comes in with cc of increasing leg pain, progressive weakness. Pt states that her entire LLE is hurting for over a week now. The pain is most significant over the knee - left, and is swollen. She has OA of the knee and required drainage before. The pains is throbbing, and is constant. Pt has taken oxycodone 10 mg q 4 hours with no relief. Pt was seen by the sickle cell clinic 2 days ago, but had to leave ama as she had to take care of her kids. Pt has no hx of dvt, PE. Pt also denies chest pain, cough, n/v/f/c.  Pt has no hx of avascular necrosis, but does have hx of acute chest and transfusions in the past.     Patient is a 41 y.o. female presenting with sickle cell pain. The history is provided by the patient.  Sickle Cell Pain Crisis  Pertinent negatives include no chest pain, no abdominal pain, no constipation, no diarrhea, no nausea, no vomiting, no hematuria, no neck pain, no cough and no rash.    Past Medical History  Diagnosis Date  . PMDD (premenstrual dysphoric disorder)   . Sickle cell anemia   . Sickle cell disease without crisis 07/15/2011  . Migraine with aura 07/15/2011    Past Surgical History  Procedure Date  . Cesarean section     Family History  Problem Relation Age of Onset  . Hypertension Mother     History  Substance Use Topics  . Smoking status: Never Smoker   . Smokeless tobacco: Not on file  . Alcohol Use: No    OB History    Grav Para Term Preterm Abortions TAB SAB Ect Mult Living                  Review of Systems  Constitutional: Negative for activity change.  HENT: Negative for facial swelling and  neck pain.   Respiratory: Negative for cough, shortness of breath and wheezing.   Cardiovascular: Negative for chest pain.  Gastrointestinal: Negative for nausea, vomiting, abdominal pain, diarrhea, constipation, blood in stool and abdominal distention.  Genitourinary: Negative for hematuria and difficulty urinating.  Musculoskeletal: Positive for joint swelling, arthralgias and gait problem.  Skin: Negative for color change and rash.  Neurological: Negative for speech difficulty.  Hematological: Does not bruise/bleed easily.  Psychiatric/Behavioral: Negative for confusion.    Allergies  Sulfa antibiotics  Home Medications   Current Outpatient Rx  Name Route Sig Dispense Refill  . VITAMIN D 1000 UNITS PO TABS Oral Take 1,000 Units by mouth daily.    Marland Kitchen CITALOPRAM HYDROBROMIDE 10 MG PO TABS Oral Take 10 mg by mouth daily.    Marland Kitchen FOLIC ACID 1 MG PO TABS Oral Take 1 mg by mouth daily.    . OXYCODONE-ACETAMINOPHEN 5-325 MG PO TABS Oral Take 1 tablet by mouth every 8 (eight) hours as needed. For pain      BP 111/32  Pulse 88  Temp 98.5 F (36.9 C) (Oral)  Resp 16  SpO2 100%  LMP 11/26/2011  Physical Exam  Constitutional: She is oriented to person, place, and time. She appears well-developed and well-nourished.  HENT:  Head: Normocephalic and atraumatic.  Eyes: EOM are normal. Pupils are equal, round, and reactive to light.  Neck: Neck supple.  Cardiovascular: Normal rate, regular rhythm and normal heart sounds.   No murmur heard. Pulmonary/Chest: Effort normal. No respiratory distress.  Abdominal: Soft. She exhibits no distension. There is no tenderness. There is no rebound and no guarding.  Musculoskeletal:       Pt has Left knee swelling, no erythema. There is no calf swelling, or tenderness with palpation, no palpable cords. 2+ DP. ROM is severely compromised, likely from the effusion.  Neurological: She is alert and oriented to person, place, and time.  Skin: Skin is warm  and dry. No rash noted. No erythema.    ED Course  Procedures (including critical care time)  Labs Reviewed  URINALYSIS, ROUTINE W REFLEX MICROSCOPIC - Abnormal; Notable for the following:    Protein, ur 30 (*)     All other components within normal limits  LACTATE DEHYDROGENASE - Abnormal; Notable for the following:    LDH 355 (*)  NO VISIBLE HEMOLYSIS   All other components within normal limits  RETICULOCYTES - Abnormal; Notable for the following:    Retic Ct Pct 11.1 (*)     RBC. 2.68 (*)     Retic Count, Manual 297.5 (*)     All other components within normal limits  CBC WITH DIFFERENTIAL - Abnormal; Notable for the following:    RBC 2.68 (*)     Hemoglobin 6.9 (*)     HCT 20.5 (*)     MCV 76.5 (*)     MCH 25.7 (*)     RDW 18.8 (*)     Platelets 88 (*)     All other components within normal limits  BASIC METABOLIC PANEL - Abnormal; Notable for the following:    Potassium 3.2 (*)     Glucose, Bld 101 (*)     BUN 5 (*)     All other components within normal limits  URINE MICROSCOPIC-ADD ON - Abnormal; Notable for the following:    Squamous Epithelial / LPF FEW (*)     All other components within normal limits  TYPE AND SCREEN   Dg Chest Port 1 View  12/06/2011  *RADIOLOGY REPORT*  Clinical Data: Sickle cell crisis  PORTABLE CHEST - 1 VIEW  Comparison: 10/09/2011  Findings: Mild cardiomegaly.  Clear lungs.  No pneumothorax and no pleural effusion.  IMPRESSION: No active cardiopulmonary disease.  Original Report Authenticated By: Donavan Burnet, M.D.     1. Sickle cell pain crisis   2. Anemia       MDM  DDx: Sickle cell pain crisis, anemia, DVT, cellulitis, OA, gout, septic arthritis. A/P: Pt with hx of sickle cell anemia comes in with cc of leg pain and getting tired. Review shows her Hb is 8.1, she denies any SOB, chest pain - but not walking due to pain. She does report getting tired easily. We will repeat CBC and transfuse if needed. Pt's knee exam reveals no  evidence of acute infection, as there is no callor or rubor, however, she does have swelling and pain. She has had hx of OA and unilateral knee pain with arthrocentesis - so we think the pain is likely from that....and septic arthritis is lower in the ddx because of that - especially since she is afebrile and her WC was normal last time.   Derwood Kaplan, MD 12/07/11 0041  12:41 AM Hb is low. We will  transfuse. We will admit, as with LDH and retic elevation, it appears that patient is in active pain crisis.   Derwood Kaplan, MD 12/07/11 0041

## 2011-12-07 NOTE — H&P (Signed)
[facility] History and Physical  Lorenzo Pereyra ZOX:[id number] DOB: 1971-03-03 DOA: 12/06/2011  Referring physician: Dr. Rhunette Croft PCP: [name], ERIC, MD   Chief Complaint: leg pain  HPI:  This is a 41 year old female with history of sickle cell disease who presents to the emergency room with complaints of left leg pain. She reports this as her usual sickle cell pain. She denies any fever, diarrhea, vomiting, dysuria. She has been training keep herself hydrated. She reports that the pain onset occurred approximately 2-3 days ago. Since then it has progressively gotten worse to the point that she is unable to stand. She was going to be admitted to the sickle cell center 2 days ago, but could not stay due to family obligations. She comes back to the hospital today was significantly worse symptoms. She does not have shortness of breath or chest pain. Her blood work indicates that her hemoglobin is lower than it was 2 days ago. Currently a 6.9. This is likely related to her fatigue. She's been referred for admission  Review of Systems:   pertinent positives as per history of present illness, otherwise negative  Past Medical History  Diagnosis Date  . PMDD (premenstrual dysphoric disorder)   . Sickle cell anemia   . Sickle cell disease without crisis 07/15/2011  . Migraine with aura 07/15/2011   Past Surgical History  Procedure Date  . Cesarean section    Social History:  reports that she has never smoked. She does not have any smokeless tobacco history on file. She reports that she does not drink alcohol or use illicit drugs.   Allergies  Allergen Reactions  . Sulfa Antibiotics Other (See Comments)    Increased bilirubin    Family History  Problem Relation Age of Onset  . Hypertension Mother     Prior to Admission medications   Medication Sig Start Date End Date Taking? Authorizing Provider  cholecalciferol (VITAMIN D) 1000 UNITS tablet Take 1,000 Units by mouth daily.   Yes  Historical Provider, MD  citalopram (CELEXA) 10 MG tablet Take 10 mg by mouth daily.   Yes Historical Provider, MD  folic acid (FOLVITE) 1 MG tablet Take 1 mg by mouth daily.   Yes Historical Provider, MD  oxyCODONE-acetaminophen (PERCOCET/ROXICET) 5-325 MG per tablet Take 1 tablet by mouth every 8 (eight) hours as needed. For pain 11/13/11  Yes Levert Feinstein, MD   Physical Exam: Filed Vitals:   12/06/11 2049 12/06/11 2331  BP: 128/80 111/32  Pulse: 101 88  Temp: 99.5 F (37.5 C) 98.5 F (36.9 C)  TempSrc: Oral Oral  Resp: 18 16  SpO2: 100% 100%     General:  The patient is lying in bed, appears to be uncomfortable due to pain  Eyes: PERRLA  ENT: mucous membranes are moist, no pharyngeal erythema   Neck: supple  Cardiovascular: s1, s2, rrr  Respiratory: CTA B  Abdomen: soft, nt, bs+  Skin: no visible rashes  Musculoskeletal: tender in LLE, no clear swelling  Psychiatric: normal affect, co operative with exam  Neurologic: non focal, grossly intact, evaluation of LLE limited by pain  Labs on Admission:  Basic Metabolic Panel:  Lab 12/06/11 8119 12/04/11 1900  NA 137 139  K 3.2* 3.2*  CL 99 101  CO2 30 31  GLUCOSE 101* 91  BUN 5* 7  CREATININE 0.65 0.75  CALCIUM 8.5 8.5  MG -- --  PHOS -- --   Liver Function Tests:  Lab 12/04/11 1900  AST 19  ALT 13  ALKPHOS 52  BILITOT 3.0*  PROT 6.4  ALBUMIN 3.5   No results found for this basename: LIPASE:5,AMYLASE:5 in the last 168 hours No results found for this basename: AMMONIA:5 in the last 168 hours CBC:  Lab 12/06/11 2240 12/04/11 1847  WBC 7.2 6.1  NEUTROABS 4.9 3.9  HGB 6.9* 8.0*  HCT 20.5* 23.6*  MCV 76.5* 74.2*  PLT 88* 92*   Cardiac Enzymes: No results found for this basename: CKTOTAL:5,CKMB:5,CKMBINDEX:5,TROPONINI:5 in the last 168 hours  BNP (last 3 results) No results found for this basename: PROBNP:3 in the last 8760 hours CBG: No results found for this basename: GLUCAP:5 in the  last 168 hours  Radiological Exams on Admission: Dg Hip Complete Left  12/07/2011  *RADIOLOGY REPORT*  Clinical Data: Sickle cell pain crisis.  Left hip pain.  LEFT HIP - COMPLETE 2+ VIEW  Comparison: None.  Findings: No acute fracture and no dislocation.  Unremarkable soft tissues.  IMPRESSION: No acute bony pathology.  Original Report Authenticated By: Donavan Burnet, M.D.   Dg Knee 2 Views Left  12/07/2011  *RADIOLOGY REPORT*  Clinical Data: Sickle cell pain crisis  LEFT KNEE - 1-2 VIEW  Comparison: 10/09/2011  Findings: No acute fracture and no dislocation.  Joint effusion.  IMPRESSION: No acute bony pathology.  Joint effusion.  Original Report Authenticated By: Donavan Burnet, M.D.   Dg Chest Port 1 View  12/06/2011  *RADIOLOGY REPORT*  Clinical Data: Sickle cell crisis  PORTABLE CHEST - 1 VIEW  Comparison: 10/09/2011  Findings: Mild cardiomegaly.  Clear lungs.  No pneumothorax and no pleural effusion.  IMPRESSION: No active cardiopulmonary disease.  Original Report Authenticated By: Donavan Burnet, M.D.      Assessment/Plan Principal Problem:  *Sickle cell crisis Active Problems:  Anemia  Leg pain  Hypokalemia  Thrombocytopenia   1. Sickle cell crisis. Patient will be admitted to a medical bed. She'll be provided IV fluids and pain control. The ED staff has already ordered 1 unit of packed red blood cells to be transfused. We will repeat labs in the morning. She has no evidence of acute chest syndrome or any neurologic deficits. 2. Leg pain. We will check venous Dopplers to rule out underlying DVT 3. Anemia. Patient is a transfused 1 unit PRBCs, we'll repeat labs in the morning 4. Hypokalemia. This will be replaced and we'll check magnesium 5. Thrombocytopenia. Unclear etiology, may be related to sickle cell, use SCDs for DVT prophylaxis  Code Status: Full code Family Communication: discussed with patient Disposition Plan: discharge home once pain improved  Time spent:   Khari Mally [facility] Pager [phone]  If 7PM-7AM, please contact night-coverage [email or web address] Password [email or web address] 12/07/2011, 1:22 AM

## 2011-12-07 NOTE — Progress Notes (Signed)
VASCULAR LAB PRELIMINARY  PRELIMINARY  PRELIMINARY  PRELIMINARY  Left lower extremity venous Doppler completed.    Preliminary report:  There is no DVT or SVT noted in the left lower extremity.  Cassie Montgomery, 12/07/2011, 11:04 AM

## 2011-12-07 NOTE — Progress Notes (Signed)
Pt of Dr Willey Blade admitted with a sickle cell pain crisis, I discussed patient with Dr. Algie Coffer on call for Dr. [name] today and he will begin seeing patient today - she is now on Dr Diamantina Providence service. Kela Millin [facility] [phone]

## 2011-12-08 DIAGNOSIS — E876 Hypokalemia: Secondary | ICD-10-CM | POA: Diagnosis not present

## 2011-12-08 DIAGNOSIS — D5700 Hb-SS disease with crisis, unspecified: Secondary | ICD-10-CM | POA: Diagnosis not present

## 2011-12-08 DIAGNOSIS — K5900 Constipation, unspecified: Secondary | ICD-10-CM | POA: Diagnosis not present

## 2011-12-08 DIAGNOSIS — J45909 Unspecified asthma, uncomplicated: Secondary | ICD-10-CM | POA: Diagnosis not present

## 2011-12-08 MED ORDER — BISACODYL 10 MG RE SUPP
10.0000 mg | Freq: Every day | RECTAL | Status: DC | PRN
  Administered 2011-12-08: 10 mg via RECTAL
  Filled 2011-12-08 (×2): qty 1

## 2011-12-08 MED ORDER — FUROSEMIDE 10 MG/ML IJ SOLN
20.0000 mg | Freq: Once | INTRAMUSCULAR | Status: AC
  Administered 2011-12-08: 20 mg via INTRAVENOUS
  Filled 2011-12-08: qty 2

## 2011-12-08 MED ORDER — POTASSIUM CHLORIDE CRYS ER 20 MEQ PO TBCR
20.0000 meq | EXTENDED_RELEASE_TABLET | Freq: Two times a day (BID) | ORAL | Status: AC
  Administered 2011-12-08 – 2011-12-09 (×4): 20 meq via ORAL
  Filled 2011-12-08 (×4): qty 1

## 2011-12-08 NOTE — Progress Notes (Signed)
Subjective:  Feeling better except constipation.  Objective:  Vital Signs in the last 24 hours: Temp:  [97.6 F (36.4 C)-99.4 F (37.4 C)] 98 F (36.7 C) (07/28 1017) Pulse Rate:  [85-97] 93  (07/28 1017) Cardiac Rhythm:  [-]  Resp:  [18-20] 18  (07/28 1017) BP: (99-140)/(68-89) 139/89 mmHg (07/28 1017) SpO2:  [94 %-100 %] 94 % (07/28 1017) Weight:  [110.133 kg (242 lb 12.8 oz)] 110.133 kg (242 lb 12.8 oz) (07/28 0612)  Physical Exam: BP Readings from Last 1 Encounters:  12/08/11 139/89    Wt Readings from Last 1 Encounters:  12/08/11 110.133 kg (242 lb 12.8 oz)    Weight change: 0.633 kg (1 lb 6.3 oz)  HEENT: [place]/AT, Eyes-Brown, PERL, EOMI, Conjunctiva-Pale, Sclera-Non-icteric Neck: No JVD, No bruit, Trachea midline. Lungs:  Clear, Bilateral. Cardiac:  Regular rhythm, normal S1 and S2, no S3.  Abdomen:  Soft, non-tender. Extremities:  Trace edema present. No cyanosis. No clubbing. CNS: AxOx3, Cranial nerves grossly intact, moves all 4 extremities. Right handed. Skin: Warm and dry.   Intake/Output from previous day: 07/27 0701 - 07/28 0700 In: 348 [I.V.:348] Out: -     Lab Results: BMET    Component Value Date/Time   NA 138 12/07/2011 0841   K 3.3* 12/07/2011 0841   CL 102 12/07/2011 0841   CO2 30 12/07/2011 0841   GLUCOSE 106* 12/07/2011 0841   BUN 4* 12/07/2011 0841   CREATININE 0.65 12/07/2011 0841   CALCIUM 8.4 12/07/2011 0841   GFRNONAA >90 12/07/2011 0841   GFRAA >90 12/07/2011 0841   CBC    Component Value Date/Time   WBC 6.8 12/07/2011 0841   WBC 7.3 07/15/2011 1048   RBC 2.88* 12/07/2011 0841   RBC 4.25 07/15/2011 1048   HGB 7.4* 12/07/2011 0841   HGB 10.9* 07/15/2011 1048   HCT 22.3* 12/07/2011 0841   HCT 30.8* 07/15/2011 1048   PLT 73* 12/07/2011 0841   PLT 156 07/15/2011 1048   MCV 77.4* 12/07/2011 0841   MCV 72.5* 07/15/2011 1048   MCH 25.7* 12/07/2011 0841   MCH 25.6 07/15/2011 1048   MCHC 33.2 12/07/2011 0841   MCHC 35.4 07/15/2011 1048   RDW 18.5* 12/07/2011 0841     RDW 16.4* 07/15/2011 1048   LYMPHSABS 1.4 12/06/2011 2240   LYMPHSABS 1.9 07/15/2011 1048   MONOABS 0.6 12/06/2011 2240   MONOABS 0.4 07/15/2011 1048   EOSABS 0.3 12/06/2011 2240   EOSABS 0.1 07/15/2011 1048   BASOSABS 0.0 12/06/2011 2240   BASOSABS 0.0 07/15/2011 1048   CARDIAC ENZYMES No results found for this basename: CKTOTAL, CKMB, CKMBINDEX, TROPONINI    Assessment/Plan:  Patient Active Hospital Problem List: Sickle cell crisis (12/07/2011) Anemia (12/07/2011) Leg pain (12/07/2011) Hypokalemia (12/07/2011) Thrombocytopenia (12/07/2011) Asthma Constipation   LOS: 2 days    Orpah Cobb  MD  12/08/2011, 10:48 AM

## 2011-12-09 LAB — COMPREHENSIVE METABOLIC PANEL
Albumin: 3.5 g/dL (ref 3.5–5.2)
BUN: 9 mg/dL (ref 6–23)
Chloride: 99 mEq/L (ref 96–112)
Creatinine, Ser: 0.71 mg/dL (ref 0.50–1.10)
GFR calc Af Amer: >90 mL/min (ref >90)
GFR calc non Af Amer: >90 mL/min (ref >90)
Glucose, Bld: 95 mg/dL (ref 70–99)
Total Bilirubin: 3.8 mg/dL — ABNORMAL HIGH (ref 0.3–1.2)

## 2011-12-09 LAB — CBC
HCT: 24 % — ABNORMAL LOW (ref 36.0–46.0)
Hemoglobin: 8 g/dL — ABNORMAL LOW (ref 12.0–15.0)
MCV: 76.4 fL — ABNORMAL LOW (ref 78.0–100.0)
RDW: 19.3 % — ABNORMAL HIGH (ref 11.5–15.5)
WBC: 5.8 10*3/uL (ref 4.0–10.5)

## 2011-12-09 MED ORDER — HYDROMORPHONE HCL PF 4 MG/ML IJ SOLN
2.0000 mg | INTRAMUSCULAR | Status: DC | PRN
  Administered 2011-12-09 – 2011-12-10 (×10): 4 mg via INTRAVENOUS
  Filled 2011-12-09 (×11): qty 1

## 2011-12-09 MED ORDER — POLYETHYLENE GLYCOL 3350 17 G PO PACK
17.0000 g | PACK | Freq: Every day | ORAL | Status: DC
  Administered 2011-12-10: 17 g via ORAL
  Filled 2011-12-09 (×2): qty 1

## 2011-12-09 MED ORDER — KCL IN DEXTROSE-NACL 20-5-0.45 MEQ/L-%-% IV SOLN
INTRAVENOUS | Status: DC
  Administered 2011-12-10: 1000 mL via INTRAVENOUS
  Filled 2011-12-09 (×2): qty 1000

## 2011-12-09 MED ORDER — HYDROCORTISONE ACE-PRAMOXINE 1-1 % RE FOAM
1.0000 | Freq: Two times a day (BID) | RECTAL | Status: DC
  Administered 2011-12-09 (×2): 1 via RECTAL
  Filled 2011-12-09: qty 10

## 2011-12-09 NOTE — Progress Notes (Signed)
Subjective:  Patient feeling better to day. Limb pain has decreased to 6/10. No other new complaints. Believes she can manage her pain at home by tomorrow.home stressors continue. We'll encourage further discussion with this as an outpatient.   Allergies  Allergen Reactions  . Sulfa Antibiotics Other (See Comments)    Increased bilirubin   Current Facility-Administered Medications  Medication Dose Route Frequency Provider Last Rate Last Dose  . acetaminophen (TYLENOL) tablet 650 mg  650 mg Oral Q4H PRN Erick Blinks, MD       Or  . acetaminophen (TYLENOL) suppository 650 mg  650 mg Rectal Q4H PRN Erick Blinks, MD      . albuterol (PROVENTIL HFA;VENTOLIN HFA) 108 (90 BASE) MCG/ACT inhaler 2 puff  2 puff Inhalation Q6H Ricki Rodriguez, MD   2 puff at 12/09/11 0836  . bisacodyl (DULCOLAX) suppository 10 mg  10 mg Rectal Daily PRN Ricki Rodriguez, MD   10 mg at 12/08/11 1218  . citalopram (CELEXA) tablet 10 mg  10 mg Oral Daily Erick Blinks, MD   10 mg at 12/09/11 1105  . dextrose 5 % and 0.45 % NaCl with KCl 20 mEq/L infusion   Intravenous Continuous Grayce Sessions, NP      . diphenhydrAMINE (BENADRYL) capsule 25-50 mg  25-50 mg Oral Q4H PRN Erick Blinks, MD       Or  . diphenhydrAMINE (BENADRYL) injection 12.5-25 mg  12.5-25 mg Intravenous Q4H PRN Erick Blinks, MD   25 mg at 12/09/11 2217  . docusate sodium (COLACE) capsule 100 mg  100 mg Oral BID Erick Blinks, MD   100 mg at 12/09/11 2217  . folic acid (FOLVITE) tablet 1 mg  1 mg Oral Daily Erick Blinks, MD   1 mg at 12/09/11 1105  . hydrocortisone-pramoxine (PROCTOFOAM-HC) rectal foam 1 applicator  1 applicator Rectal BID Gwenyth Bender, MD   1 applicator at 12/09/11 2220  . HYDROmorphone (DILAUDID) injection 2-4 mg  2-4 mg Intravenous Q2H PRN Gwenyth Bender, MD   4 mg at 12/09/11 2217  . ketorolac (TORADOL) 30 MG/ML injection 30 mg  30 mg Intravenous Q6H Erick Blinks, MD   30 mg at 12/09/11 1826  . polyethylene glycol (MIRALAX  / GLYCOLAX) packet 17 g  17 g Oral Daily Gwenyth Bender, MD      . potassium chloride SA (K-DUR,KLOR-CON) CR tablet 20 mEq  20 mEq Oral BID Ricki Rodriguez, MD   20 mEq at 12/09/11 2216  . promethazine (PHENERGAN) tablet 12.5-25 mg  12.5-25 mg Oral Q4H PRN Erick Blinks, MD   25 mg at 12/09/11 2001   Or  . promethazine (PHENERGAN) suppository 12.5-25 mg  12.5-25 mg Rectal Q4H PRN Erick Blinks, MD      . senna (SENOKOT) tablet 8.6 mg  1 tablet Oral BID Erick Blinks, MD   8.6 mg at 12/09/11 2216  . DISCONTD: 0.9 % NaCl with KCl 20 mEq/ L  infusion   Intravenous Continuous Ricki Rodriguez, MD 50 mL/hr at 12/09/11 1703    . DISCONTD: HYDROmorphone (DILAUDID) injection 2-4 mg  2-4 mg Intravenous Q2H PRN Erick Blinks, MD   4 mg at 12/09/11 0405    Objective: Blood pressure 144/91, pulse 83, temperature 98.6 F (37 C), temperature source Oral, resp. rate 16, height 5\' 6"  (1.676 m), weight 243 lb 8 oz (110.451 kg), last menstrual period 11/26/2011, SpO2 99.00%.  Well-developed overweight black female anxious though in no acute distress. Extremely talkative.  HEENT:no sinus tenderness. No sclera icterus. NECK:no enlarged thyroid. No posterior cervical nodes. LUNGS:clear to auscultation. No wheezes. WU:JWJXBJ S1, S2 without S3. ABD:no epigastric tenderness. YNW:GNFAO pitting edema bilaterally. Negative Homans. NEURO:intact.  Lab results: Results for orders placed during the hospital encounter of 12/06/11 (from the past 48 hour(s))  CBC     Status: Abnormal   Collection Time   12/09/11  3:44 AM      Component Value Range Comment   WBC 5.8  4.0 - 10.5 K/uL    RBC 3.14 (*) 3.87 - 5.11 MIL/uL    Hemoglobin 8.0 (*) 12.0 - 15.0 g/dL    HCT 13.0 (*) 86.5 - 46.0 %    MCV 76.4 (*) 78.0 - 100.0 fL    MCH 25.5 (*) 26.0 - 34.0 pg    MCHC 33.3  30.0 - 36.0 g/dL    RDW 78.4 (*) 69.6 - 15.5 %    Platelets 84 (*) 150 - 400 K/uL CONSISTENT WITH PREVIOUS RESULT  COMPREHENSIVE METABOLIC PANEL     Status:  Abnormal   Collection Time   12/09/11  3:44 AM      Component Value Range Comment   Sodium 137  135 - 145 mEq/L    Potassium 3.5  3.5 - 5.1 mEq/L    Chloride 99  96 - 112 mEq/L    CO2 29  19 - 32 mEq/L    Glucose, Bld 95  70 - 99 mg/dL    BUN 9  6 - 23 mg/dL    Creatinine, Ser 2.95  0.50 - 1.10 mg/dL    Calcium 8.8  8.4 - 28.4 mg/dL    Total Protein 6.6  6.0 - 8.3 g/dL    Albumin 3.5  3.5 - 5.2 g/dL    AST 21  0 - 37 U/L    ALT 16  0 - 35 U/L    Alkaline Phosphatase 60  39 - 117 U/L    Total Bilirubin 3.8 (*) 0.3 - 1.2 mg/dL    GFR calc non Af Amer >90  >90 mL/min    GFR calc Af Amer >90  >90 mL/min     Studies/Results: No results found.  Patient Active Problem List  Diagnosis  . Sickle cell disease without crisis  . Migraine with aura  . Sickle cell crisis  . Anemia  . Leg pain  . Hypokalemia  . Thrombocytopenia    Impression: Resolving crisis with active hemolysis. Hypokalemia, improved. Thrombocytopenia. Situational stress. Venous insufficiency.   Plan: Decrease IV fluids Home in am. Outpatient followup of multiple issues.   Cassie Montgomery, Cassie Montgomery 12/09/2011 10:41 PM

## 2011-12-10 LAB — COMPREHENSIVE METABOLIC PANEL
Albumin: 3.8 g/dL (ref 3.5–5.2)
BUN: 9 mg/dL (ref 6–23)
Creatinine, Ser: 0.75 mg/dL (ref 0.50–1.10)
Potassium: 4.4 mEq/L (ref 3.5–5.1)
Total Protein: 6.9 g/dL (ref 6.0–8.3)

## 2011-12-10 LAB — CBC
HCT: 25.2 % — ABNORMAL LOW (ref 36.0–46.0)
MCHC: 33.3 g/dL (ref 30.0–36.0)
MCV: 76.6 fL — ABNORMAL LOW (ref 78.0–100.0)
RDW: 19.2 % — ABNORMAL HIGH (ref 11.5–15.5)

## 2011-12-10 LAB — TYPE AND SCREEN
Unit division: 0
Unit division: 0

## 2011-12-10 MED ORDER — MORPHINE SULFATE ER 15 MG PO TBCR: 15.0000 mg | EXTENDED_RELEASE_TABLET | Freq: Two times a day (BID) | ORAL | Status: DC

## 2011-12-10 MED ORDER — OXYCODONE-ACETAMINOPHEN 5-325 MG PO TABS: 1.0000 | ORAL_TABLET | Freq: Three times a day (TID) | ORAL | Status: DC | PRN

## 2011-12-10 MED ORDER — KETOROLAC TROMETHAMINE 30 MG/ML IJ SOLN
30.0000 mg | Freq: Four times a day (QID) | INTRAMUSCULAR | Status: DC
  Administered 2011-12-10: 30 mg via INTRAMUSCULAR
  Filled 2011-12-10: qty 1

## 2011-12-10 NOTE — Progress Notes (Signed)
Awaiting finalization of discharge papers. Gwinda Passe NP was paged to notify. Awaiting call back. Pt's health status has not changed since AM assessment. Pt left floor without receiving discharge education.

## 2011-12-10 NOTE — Discharge Summary (Signed)
[facility]   Patient ID: Cassie Montgomery MRN: [id number] DOB/AGE: [id number] y.o.  Admit date: 12/06/2011 Discharge date: 12/10/2011  Primary Care Physician:  Willey Blade, MD  Admission Diagnoses:  Principal Problem:  Sickle cell crisis Active Problems:  Anemia  Leg pain  Hypokalemia  Thrombocytopenia   Discharge Diagnoses:   Sickle cell crisis  Anemia  Leg pain  Hypokalemia  Thrombocytopenia  Discharge Medications:  Medication List  As of 12/10/2011 10:09 AM   ASK your doctor about these medications         cholecalciferol 1000 UNITS tablet   Commonly known as: VITAMIN D   Take 1,000 Units by mouth daily.      citalopram 10 MG tablet   Commonly known as: CELEXA   Take 10 mg by mouth daily.      folic acid 1 MG tablet   Commonly known as: FOLVITE   Take 1 mg by mouth daily.      oxyCODONE-acetaminophen 5-325 MG per tablet   Commonly known as: PERCOCET/ROXICET   Take 1 tablet by mouth every 8 (eight) hours as needed. For pain             Consults:  None  Significant Diagnostic Studies:  Dg Hip Complete Left  12/07/2011  *RADIOLOGY REPORT*  Clinical Data: Sickle cell pain crisis.  Left hip pain.  LEFT HIP - COMPLETE 2+ VIEW  Comparison: None.  Findings: No acute fracture and no dislocation.  Unremarkable soft tissues.  IMPRESSION: No acute bony pathology.  Original Report Authenticated By: Donavan Burnet, M.D.   Dg Knee 2 Views Left  12/07/2011  *RADIOLOGY REPORT*  Clinical Data: Sickle cell pain crisis  LEFT KNEE - 1-2 VIEW  Comparison: 10/09/2011  Findings: No acute fracture and no dislocation.  Joint effusion.  IMPRESSION: No acute bony pathology.  Joint effusion.  Original Report Authenticated By: Donavan Burnet, M.D.   Dg Chest Port 1 View  12/06/2011  *RADIOLOGY REPORT*  Clinical Data: Sickle cell crisis  PORTABLE CHEST - 1 VIEW  Comparison: 10/09/2011  Findings: Mild cardiomegaly.  Clear lungs.  No pneumothorax and no  pleural effusion.  IMPRESSION: No active cardiopulmonary disease.  Original Report Authenticated By: Donavan Burnet, M.D.     [facility] Course:  For complete details please refer to admission H and P, but in brief,This is a 41 year old female with history of sickle cell disease who presents to the emergency room with complaints of left leg pain. She reports this as her usual sickle cell pain. She has been training keep herself hydrated. She reports that the pain onset occurred approximately 2-3 days ago. Since then it has progressively gotten worse to the point that she is unable to stand. She was going to be admitted to the [facility] cell center 2 days ago, but could not stay due to family obligations. She comes back to the hospital today was significantly worse symptoms. Admitted for further evaluation /work up and tx of Sickle cell crisis and chronic pain.   Physical Exam at Discharge:  BP 128/71  Pulse 81  Temp 98.5 F (36.9 C) (Oral)  Resp 18  Ht 5\' 6"  (1.676 m)  Wt 110.7 kg (244 lb 0.8 oz)  BMI 39.39 kg/m2  SpO2 97%  LMP 11/26/2011 GENERAL: Well-developed obese black female  HEENT:no sinus tenderness. No sclera icterus.  NECK: thick supple , no enlarged thyroid. No posterior cervical nodes.  LUNGS:clear to auscultation. No wheezes.  CARDIOVASCULAR: :normal S1, S2 without S3.  ABDOMEN:distended soft no guarding BS present all quadrants  MUSCLE SKELETAL:  edema bilaterally significant on left side . Negative Homans.   Disposition at Discharge: Home and Self Care  Discharge Orders: Discharge Orders    Future Appointments: Provider: Department: Dept Phone: Center:   01/22/2012 1:15 PM Delcie Roch [facility] [phone] None   01/22/2012 1:45 PM Rana Snare, NP [facility] [phone] None      Condition at Discharge:   Stable  Time spent on Discharge:  Greater than 30 minutes.  Signed: EDWARDS, MICHELLE P 12/10/2011, 10:09 AM

## 2011-12-16 ENCOUNTER — Telehealth (HOSPITAL_COMMUNITY): Payer: Self-pay | Admitting: *Deleted

## 2011-12-17 DIAGNOSIS — D649 Anemia, unspecified: Secondary | ICD-10-CM | POA: Diagnosis not present

## 2011-12-17 DIAGNOSIS — D5720 Sickle-cell/Hb-C disease without crisis: Secondary | ICD-10-CM | POA: Diagnosis not present

## 2011-12-17 DIAGNOSIS — I10 Essential (primary) hypertension: Secondary | ICD-10-CM | POA: Diagnosis not present

## 2011-12-18 ENCOUNTER — Other Ambulatory Visit: Payer: Self-pay | Admitting: *Deleted

## 2011-12-18 NOTE — Telephone Encounter (Signed)
Patient called and left message that she wanted refill of her medication.  Pt. Received MS Contin - 1 q12hrs #30 on 12/10/11  And oxycodone 1 Q8hrs. # 60 on 12/10/11. Called patient back and left her a message that it is too early to refill her pain medicine.  If she is having more problems with pain she needs to call us back.

## 2011-12-19 ENCOUNTER — Other Ambulatory Visit: Payer: Self-pay | Admitting: *Deleted

## 2011-12-19 NOTE — Telephone Encounter (Signed)
Received call from pt stating that she needs a refill on her percocet.  Explained that based on last instructions that it is too early to fill.  She reports that she didn't like the morphine & didn't think it helped much.  Explained that this is a long acting drug that she needs to take on a regular basis to help her pain & use the percocet as needed q 8 h.  She reports that she has been taking the percocet 2 tabs q 6 hrs instead of the way it is ordered 1 q 8 h.  She still has some left, possibly 1 wks worth & will call back then.

## 2011-12-30 ENCOUNTER — Other Ambulatory Visit: Payer: Self-pay | Admitting: *Deleted

## 2011-12-30 DIAGNOSIS — D5700 Hb-SS disease with crisis, unspecified: Secondary | ICD-10-CM

## 2011-12-30 MED ORDER — OXYCODONE-ACETAMINOPHEN 5-325 MG PO TABS: 1.0000 | ORAL_TABLET | Freq: Three times a day (TID) | ORAL | Status: DC | PRN

## 2011-12-30 NOTE — Telephone Encounter (Signed)
Pt called for refill on her percocet & would like to p/u today.  Reported that it would be ready this pm.

## 2012-01-03 ENCOUNTER — Other Ambulatory Visit: Payer: Self-pay | Admitting: *Deleted

## 2012-01-03 NOTE — Telephone Encounter (Signed)
Patient is concerned that she didn't get as many Percocet  On 12-30-11 as she usually does.  She received #60 instead of #100.  It may have been because when she was in the hospital they discharged her with MS Contin and then Percocet for breakthrough.  Patient states she is not taking morphine because she does not like it.  Patient  Sees Cassie Cobb NP on 01/22/12 and she can discuss this with her then.  If she needs more pain meds before then, she can call and explain the situation.  Patient is comfortable with this solution.

## 2012-01-10 ENCOUNTER — Other Ambulatory Visit: Payer: Self-pay | Admitting: *Deleted

## 2012-01-10 DIAGNOSIS — D5700 Hb-SS disease with crisis, unspecified: Secondary | ICD-10-CM

## 2012-01-10 MED ORDER — OXYCODONE-ACETAMINOPHEN 5-325 MG PO TABS: ORAL_TABLET | ORAL | Status: DC

## 2012-01-10 NOTE — Telephone Encounter (Signed)
Pt called for refill on her percocet, stating she has a few left but afraid b/c this is a long weekend.  She reports that she was only given # 60 last time with instructions of 1 q 8 h prn & she is taking 1-2 q 4 h prn pain which is what she was on before & wants to go back to this.  I think she was given this script last b/c she had been in the hosp & received percocet script & morphine script & her MD was out of office & NP ordered this.  OK per Dr.Granfortuna to got back to usual script.  Pt will p/u.

## 2012-01-22 ENCOUNTER — Other Ambulatory Visit (HOSPITAL_BASED_OUTPATIENT_CLINIC_OR_DEPARTMENT_OTHER): Payer: Medicare Other

## 2012-01-22 ENCOUNTER — Ambulatory Visit (HOSPITAL_BASED_OUTPATIENT_CLINIC_OR_DEPARTMENT_OTHER): Payer: Medicare Other | Admitting: Nurse Practitioner

## 2012-01-22 ENCOUNTER — Telehealth: Payer: Self-pay | Admitting: Oncology

## 2012-01-22 VITALS — BP 128/85 | HR 92 | Temp 98.7°F | Resp 20 | Ht 66.0 in | Wt 233.1 lb

## 2012-01-22 DIAGNOSIS — M25562 Pain in left knee: Secondary | ICD-10-CM

## 2012-01-22 DIAGNOSIS — M25569 Pain in unspecified knee: Secondary | ICD-10-CM

## 2012-01-22 DIAGNOSIS — G43109 Migraine with aura, not intractable, without status migrainosus: Secondary | ICD-10-CM | POA: Diagnosis not present

## 2012-01-22 DIAGNOSIS — D571 Sickle-cell disease without crisis: Secondary | ICD-10-CM

## 2012-01-22 DIAGNOSIS — M25469 Effusion, unspecified knee: Secondary | ICD-10-CM

## 2012-01-22 DIAGNOSIS — D5700 Hb-SS disease with crisis, unspecified: Secondary | ICD-10-CM

## 2012-01-22 LAB — CBC & DIFF AND RETIC
Eosinophils Absolute: 0.3 10*3/uL (ref 0.0–0.5)
HCT: 28.4 % — ABNORMAL LOW (ref 34.8–46.6)
LYMPH%: 33.7 % (ref 14.0–49.7)
MONO#: 0.4 10*3/uL (ref 0.1–0.9)
NEUT#: 3.8 10*3/uL (ref 1.5–6.5)
NEUT%: 55.5 % (ref 38.4–76.8)
Platelets: 131 10*3/uL — ABNORMAL LOW (ref 145–400)
Retic %: 6.18 % — ABNORMAL HIGH (ref 0.70–2.10)
WBC: 6.9 10*3/uL (ref 3.9–10.3)

## 2012-01-22 LAB — COMPREHENSIVE METABOLIC PANEL (CC13)
Albumin: 3.8 g/dL (ref 3.5–5.0)
Alkaline Phosphatase: 53 U/L (ref 40–150)
CO2: 24 mEq/L (ref 22–29)
Glucose: 106 mg/dl — ABNORMAL HIGH (ref 70–99)
Potassium: 3.3 mEq/L — ABNORMAL LOW (ref 3.5–5.1)
Sodium: 141 mEq/L (ref 136–145)
Total Protein: 7 g/dL (ref 6.4–8.3)

## 2012-01-22 MED ORDER — OXYCODONE-ACETAMINOPHEN 5-325 MG PO TABS: ORAL_TABLET | ORAL | Status: DC

## 2012-01-22 NOTE — Telephone Encounter (Signed)
Gave pt appt calendar for September 2013 MRI then see ML on October 2013 with labs

## 2012-01-22 NOTE — Progress Notes (Signed)
OFFICE PROGRESS NOTE  Interval history:  Cassie Montgomery is a 41 year old woman with sickle cell disease. She is seen today for scheduled followup.  She reports an approximate 1 year history of progressive pain involving the left knee. She is taking oxycodone every 4 hours around the clock to keep the pain under control. She reports being diagnosed with "osteoarthritis". She reports fluid has been drained off of the knee on 2 occasions.  She reports more crises over the past several months than has been typical for her. She does not feel that the knee pain is crisis related. She wonders if the knee pain is "triggering" crises.  She has mild intermittent pain involving the left hip and foot. No known injury. She is fatigued.  X-ray of the left knee on 05/05/2011 showed no evidence of fracture or dislocation. A moderate knee joint effusion was noted. X-ray of the left knee on 10/09/2011 showed no significant degenerative change. A moderate left knee joint effusion was noted. X-ray on 12/07/2011 showed a joint effusion and no acute bony pathology. Hip x-ray also on 12/07/2011 was negative. Venous Doppler on 12/07/2011 showed no evidence of deep vein or superficial thrombosis involving the left lower extremity and right common femoral vein. There was no evidence of a Baker's cyst on the left.  Objective: Blood pressure 128/85, pulse 92, temperature 98.7 F (37.1 C), resp. rate 20, height 5\' 6"  (1.676 m), weight 233 lb 1.6 oz (105.733 kg).  Oropharynx is without thrush or ulceration. Lungs are clear. No wheezes or rales. Regular cardiac rhythm. Abdomen is soft and nontender. No organomegaly. Extremities are without edema. Calves are soft and nontender. The left knee appear slightly larger than the right knee. No erythema, warmth or tenderness.  Lab Results: Lab Results  Component Value Date   WBC 6.3 12/10/2011   HGB 8.4* 12/10/2011   HCT 25.2* 12/10/2011   MCV 76.6* 12/10/2011   PLT 97* 12/10/2011     Chemistry:    Chemistry      Component Value Date/Time   NA 135 12/10/2011 0913   K 4.4 12/10/2011 0913   CL 98 12/10/2011 0913   CO2 31 12/10/2011 0913   BUN 9 12/10/2011 0913   CREATININE 0.75 12/10/2011 0913      Component Value Date/Time   CALCIUM 9.3 12/10/2011 0913   ALKPHOS 56 12/10/2011 0913   AST 24 12/10/2011 0913   ALT 16 12/10/2011 0913   BILITOT 4.0* 12/10/2011 0913       Studies/Results: No results found.  Medications: I have reviewed the patient's current medications.  Assessment/Plan:  1. Sickle cell disease with more frequent crises recently. 2. Left knee pain with effusion noted on x-ray. 3. History of migraine headaches.  Disposition-the etiology of the left knee pain is unknown. We are referring her for an MRI to evaluate for avascular necrosis. She will return for a followup visit in one month. We will contact her once the result of the MRI is available. She will contact the office prior to the next visit with any problems. She was given a new prescription for Percocet 5/325 one to 2 tablets every 4 hours as needed.  Plan reviewed with Dr. Cyndie Chime.  Lonna Cobb ANP/GNP-BC

## 2012-01-22 NOTE — Discharge Summary (Signed)
[facility]charge Summary  Patient ID: Cassie Montgomery MRN: [id number] DOB/AGE: 07-03-1970 41 y.o.  Admit date: 12/04/2011 Discharge date: 12/05/2011  Admission Diagnoses:  Discharge Diagnoses:  Sickle-cell crisis Hypokalemia Asthma Leg pain rule out DVT. Situational stress.  Discharged Condition: improved Clinic Course: patient was started on IV fluids, IV Dilaudid for pain. She was noted to have  bronchospasm on exam initially as well. She was given nebulizer treatments. Patient potassium was low and this was replenished as well. Patient made improvement overnight. Early in the morning however she found she did not have child care. She subsequently had to leave the unit AMA to followup on childcare responsibilities. At the time of discharge she was feeling better. She was still in need of having a Doppler study to rule out DVT of the lower extremity. The patient agreed to contact office for followup.  Significant Diagnostic Studies: as noted on admission H&P   Discharge vital signs: Blood pressure 134/76, pulse 92, temperature 98 F (36.7 C), temperature source Oral, resp. rate 18, last menstrual period 11/26/2011, SpO2 98.00%.  Disposition: 01-Home or Self Care   Discharge Medications: Citalopram 10 mg daily Folic acid 1 mg daily Oxycodone/acetaminophen 5/325 mg 1-2 tabs q.4 hours p.r.n. Pain     Signed: [name], Reva Pinkley 12/05/2011, 9:50 p.m.

## 2012-01-27 ENCOUNTER — Ambulatory Visit (HOSPITAL_COMMUNITY)
Admission: RE | Admit: 2012-01-27 | Discharge: 2012-01-27 | Disposition: A | Discharge status: Home / Self Care | Payer: Medicare Other | Source: Ambulatory Visit | Attending: Nurse Practitioner | Admitting: Nurse Practitioner

## 2012-01-27 DIAGNOSIS — D571 Sickle-cell disease without crisis: Secondary | ICD-10-CM | POA: Diagnosis not present

## 2012-01-27 DIAGNOSIS — X58XXXA Exposure to other specified factors, initial encounter: Secondary | ICD-10-CM | POA: Insufficient documentation

## 2012-01-27 DIAGNOSIS — M2240 Chondromalacia patellae, unspecified knee: Secondary | ICD-10-CM | POA: Diagnosis not present

## 2012-01-27 DIAGNOSIS — IMO0002 Reserved for concepts with insufficient information to code with codable children: Secondary | ICD-10-CM | POA: Insufficient documentation

## 2012-01-27 DIAGNOSIS — M23329 Other meniscus derangements, posterior horn of medial meniscus, unspecified knee: Secondary | ICD-10-CM | POA: Diagnosis not present

## 2012-01-27 DIAGNOSIS — M6580 Other synovitis and tenosynovitis, unspecified site: Secondary | ICD-10-CM | POA: Diagnosis not present

## 2012-01-27 DIAGNOSIS — M25562 Pain in left knee: Secondary | ICD-10-CM

## 2012-01-27 MED ORDER — GADOBENATE DIMEGLUMINE 529 MG/ML IV SOLN
20.0000 mL | Freq: Once | INTRAVENOUS | Status: AC | PRN
  Administered 2012-01-27: 20 mL via INTRAVENOUS

## 2012-01-29 ENCOUNTER — Telehealth: Payer: Self-pay | Admitting: *Deleted

## 2012-01-29 NOTE — Telephone Encounter (Signed)
Message copied by Sabino Snipes on Wed Jan 29, 2012  5:26 PM ------      Message from: Levert Feinstein      Created: Wed Jan 29, 2012  9:02 AM       Call pt torn cartilage in knee  - needs ortho referral  This could be repaired surgically. Bones OK - getting good blood supply.  Would refer to [facility]

## 2012-01-29 NOTE — Telephone Encounter (Signed)
Message left on pt's vm to call back tomorrow for report.

## 2012-01-31 ENCOUNTER — Telehealth: Payer: Self-pay | Admitting: *Deleted

## 2012-01-31 NOTE — Telephone Encounter (Signed)
Referral made to [facility] regarding tear in cartilage of left knee.   They are trying to see if they can get pt in today & will call back.   Pt was also notified of MRI results & need for referral.  She reports seeing some ortho but doesn't remember who it was.

## 2012-01-31 NOTE — Telephone Encounter (Signed)
Received vm stating that Dr. Ciro Backer will try to see pt today this pm.   Reached pt @ 1pm & asked her to call Kristen @ [phone].

## 2012-02-05 ENCOUNTER — Other Ambulatory Visit: Payer: Self-pay | Admitting: *Deleted

## 2012-02-05 DIAGNOSIS — D5700 Hb-SS disease with crisis, unspecified: Secondary | ICD-10-CM

## 2012-02-05 DIAGNOSIS — M25562 Pain in left knee: Secondary | ICD-10-CM

## 2012-02-05 MED ORDER — OXYCODONE-ACETAMINOPHEN 5-325 MG PO TABS: ORAL_TABLET | ORAL | Status: DC

## 2012-02-05 NOTE — Telephone Encounter (Signed)
Pt. Called for pain meds.  Dr. Cyndie Chime will refill this time only.  Next refill needs to come from PCP- Dr. [name] or orthopedic.  Pt. States she understands. Pt. Complains that she is unable to get appt with orthopedic.  Called [facility] [phone] and spoke with [place].  They tried to call patient 3 times yesterday.  She did not answer the phone and they were unable to leave a voice mail.  They would like her to call tomorrow between 10-11am. - which patient says she will do.

## 2012-02-07 ENCOUNTER — Telehealth: Payer: Self-pay

## 2012-02-07 NOTE — Telephone Encounter (Signed)
Told Ms. Kellas that Benjamin called from [employer] and said that she needs to call the main number [phone] and press the prompt for appointments and Junious Dresser will schedule an appointment.  Ms. Burrowes keeps leaving message with Baxter Hire who is not in scheduling. Ms. Figura verbalized understanding.

## 2012-02-19 ENCOUNTER — Ambulatory Visit: Payer: Medicare Other | Admitting: Nurse Practitioner

## 2012-02-19 ENCOUNTER — Other Ambulatory Visit: Payer: Medicare Other | Admitting: Lab

## 2012-02-25 DIAGNOSIS — M1710 Unilateral primary osteoarthritis, unspecified knee: Secondary | ICD-10-CM | POA: Diagnosis not present

## 2012-03-08 ENCOUNTER — Encounter (HOSPITAL_COMMUNITY): Payer: Self-pay | Admitting: Hematology

## 2012-03-08 ENCOUNTER — Telehealth (HOSPITAL_COMMUNITY): Payer: Self-pay

## 2012-03-08 ENCOUNTER — Non-Acute Institutional Stay (HOSPITAL_COMMUNITY)
Admission: AD | Admit: 2012-03-08 | Discharge: 2012-03-08 | Disposition: A | Discharge status: Home / Self Care | Payer: Medicare Other | Attending: Internal Medicine | Admitting: Internal Medicine

## 2012-03-08 DIAGNOSIS — R030 Elevated blood-pressure reading, without diagnosis of hypertension: Secondary | ICD-10-CM | POA: Diagnosis not present

## 2012-03-08 DIAGNOSIS — M25562 Pain in left knee: Secondary | ICD-10-CM

## 2012-03-08 DIAGNOSIS — E669 Obesity, unspecified: Secondary | ICD-10-CM | POA: Diagnosis not present

## 2012-03-08 DIAGNOSIS — D5700 Hb-SS disease with crisis, unspecified: Secondary | ICD-10-CM | POA: Diagnosis not present

## 2012-03-08 DIAGNOSIS — F430 Acute stress reaction: Secondary | ICD-10-CM | POA: Insufficient documentation

## 2012-03-08 LAB — COMPREHENSIVE METABOLIC PANEL
ALT: 11 U/L (ref 0–35)
AST: 12 U/L (ref 0–37)
Alkaline Phosphatase: 53 U/L (ref 39–117)
GFR calc Af Amer: >90 mL/min (ref >90)
Glucose, Bld: 94 mg/dL (ref 70–99)
Potassium: 3.5 mEq/L (ref 3.5–5.1)
Sodium: 136 mEq/L (ref 135–145)
Total Protein: 6.7 g/dL (ref 6.0–8.3)

## 2012-03-08 LAB — CBC WITH DIFFERENTIAL/PLATELET
Eosinophils Relative: 2 % (ref 0–5)
HCT: 30.7 % — ABNORMAL LOW (ref 36.0–46.0)
Hemoglobin: 10.6 g/dL — ABNORMAL LOW (ref 12.0–15.0)
Lymphocytes Relative: 22 % (ref 12–46)
Lymphs Abs: 2 10*3/uL (ref 0.7–4.0)
MCV: 74.5 fL — ABNORMAL LOW (ref 78.0–100.0)
Monocytes Absolute: 0.5 10*3/uL (ref 0.1–1.0)
Monocytes Relative: 6 % (ref 3–12)
Neutro Abs: 6.5 10*3/uL (ref 1.7–7.7)
RBC: 4.12 MIL/uL (ref 3.87–5.11)
RDW: 16.6 % — ABNORMAL HIGH (ref 11.5–15.5)
WBC: 9.3 10*3/uL (ref 4.0–10.5)

## 2012-03-08 LAB — RETICULOCYTES: Retic Count, Absolute: 189.5 10*3/uL — ABNORMAL HIGH (ref 19.0–186.0)

## 2012-03-08 MED ORDER — ONDANSETRON HCL 4 MG/2ML IJ SOLN
4.0000 mg | INTRAMUSCULAR | Status: DC | PRN
  Administered 2012-03-08 (×2): 4 mg via INTRAVENOUS
  Filled 2012-03-08 (×2): qty 2

## 2012-03-08 MED ORDER — LORAZEPAM 1 MG PO TABS
1.0000 mg | ORAL_TABLET | Freq: Four times a day (QID) | ORAL | Status: DC | PRN
  Administered 2012-03-08: 1 mg via ORAL
  Filled 2012-03-08: qty 1

## 2012-03-08 MED ORDER — DIPHENHYDRAMINE HCL 25 MG PO CAPS: 25.0000 mg | ORAL_CAPSULE | ORAL | Status: DC | PRN

## 2012-03-08 MED ORDER — HYDROMORPHONE HCL PF 2 MG/ML IJ SOLN
2.0000 mg | INTRAMUSCULAR | Status: DC | PRN
  Administered 2012-03-08: 2 mg via INTRAVENOUS
  Administered 2012-03-08 (×2): 4 mg via INTRAVENOUS
  Administered 2012-03-08 (×2): 2 mg via INTRAVENOUS
  Filled 2012-03-08: qty 1
  Filled 2012-03-08 (×2): qty 2
  Filled 2012-03-08: qty 1
  Filled 2012-03-08: qty 2

## 2012-03-08 MED ORDER — DEXTROSE-NACL 5-0.45 % IV SOLN
INTRAVENOUS | Status: DC
  Administered 2012-03-08 (×2): via INTRAVENOUS

## 2012-03-08 MED ORDER — OXYCODONE-ACETAMINOPHEN 5-325 MG PO TABS: ORAL_TABLET | ORAL | Status: DC

## 2012-03-08 MED ORDER — ONDANSETRON HCL 4 MG PO TABS: 4.0000 mg | ORAL_TABLET | ORAL | Status: DC | PRN

## 2012-03-08 MED ORDER — DIPHENHYDRAMINE HCL 50 MG/ML IJ SOLN
12.5000 mg | INTRAMUSCULAR | Status: DC | PRN
  Administered 2012-03-08: 25 mg via INTRAVENOUS
  Administered 2012-03-08: 12.5 mg via INTRAVENOUS
  Filled 2012-03-08 (×2): qty 1

## 2012-03-08 MED ORDER — LORAZEPAM 1 MG PO TABS: 1.0000 mg | ORAL_TABLET | Freq: Four times a day (QID) | ORAL | Status: DC | PRN

## 2012-03-08 MED ORDER — FOLIC ACID 1 MG PO TABS: 1.0000 mg | ORAL_TABLET | Freq: Every day | ORAL | Status: DC

## 2012-03-08 NOTE — H&P (Signed)
[facility]   Patient ID: Cassie Montgomery, female   DOB: 1970-05-29, 41 y.o.   MRN: [id number]  Chief Complaint  Patient presents with  . Sickle Cell Pain Crisis    HPI Cassie Montgomery is a 41 y.o. female  with sickle cell disease who presents complaining of new onset of pain this morning. She notes that she had been on increased stress recently but otherwise been feeling well. This morning she woke up with severe pain in her lower back and limbs. She rated her pain as 8/10. She denied any chest pains. She took her medication but this did not help. He subsequent called in and comes in for further evaluation. Patient notes that she was outside more than usual with a gradual walking yesterday. She also received bad news regarding one of her children and your. This may cause more stressors on her as well.  Past Medical History  Diagnosis Date  . PMDD (premenstrual dysphoric disorder)   . Sickle cell anemia   . Sickle cell disease without crisis 07/15/2011  . Migraine with aura 07/15/2011    Past Surgical History  Procedure Date  . Cesarean section     Family History  Problem Relation Age of Onset  . Hypertension Mother     Social History History  Substance Use Topics  . Smoking status: Never Smoker   . Smokeless tobacco: Not on file  . Alcohol Use: No    Allergies  Allergen Reactions  . Sulfa Antibiotics Other (See Comments)    Increased bilirubin    Current Facility-Administered Medications  Medication Dose Route Frequency Provider Last Rate Last Dose  . dextrose 5 %-0.45 % sodium chloride infusion   Intravenous Continuous Gwenyth Bender, MD 125 mL/hr at 03/08/12 1409    . diphenhydrAMINE (BENADRYL) capsule 25-50 mg  25-50 mg Oral Q4H PRN Gwenyth Bender, MD       Or  . diphenhydrAMINE (BENADRYL) injection 12.5-25 mg  12.5-25 mg Intravenous Q4H PRN Gwenyth Bender, MD   12.5 mg at 03/08/12 1358  . folic acid (FOLVITE) tablet 1 mg  1 mg Oral Daily Gwenyth Bender, MD        . HYDROmorphone (DILAUDID) injection 2-4 mg  2-4 mg Intravenous Q2H PRN Gwenyth Bender, MD   4 mg at 03/08/12 1359  . ondansetron (ZOFRAN) tablet 4 mg  4 mg Oral Q4H PRN Gwenyth Bender, MD       Or  . ondansetron [facility]) injection 4 mg  4 mg Intravenous Q4H PRN Gwenyth Bender, MD   4 mg at 03/08/12 1359    Review of Systems As noted above. She's had some sinus congestion with postnasal drainage. She denies significant yellow-green secretions. She recent had her right knee aspirated per orthopedics. He was felt or meniscal damage wasn't severe enough to undergo orthopedic surgery. There was discussion regarding use of an NSAID agent. She has a distant history of peptic ulcer disease. Blood pressure 141/109, pulse 84, temperature 98.2 F (36.8 C), temperature source Oral, resp. rate 18, height 5\' 6"  (1.676 m), weight 220 lb (99.791 kg), last menstrual period 02/20/2012, SpO2 100.00%.  Physical Exam Well-developed overweight black female in mild distress. HEENT: Head normocephalic atraumatic. She has mild left maxilla sinus tenderness. Minimal sclera icterus. TMs are clear. Throat with posterior pharyngeal erythema. NECK: No enlarged thyroid. Small posterior cervical nodes bilaterally. Nontender. LUNGS: Clear to auscultation. No vocal fremitus. No CVA tenderness. CV: Normal S1, S2 without  S3. No murmurs or rubs. ABDOMEN: No masses or tenderness. MSK: Tenderness in the lower lumbar sacral spine. She has tenderness in the right knee greater than left. Tenderness in the ankles. Negative Homans. NEURO: Intact. PSYCHIATRIC: Anxious with associated depressed mood. She is requesting something to help her rest. Data Reviewed  Results for orders placed during the hospital encounter of 03/08/12 (from the past 48 hour(s))  COMPREHENSIVE METABOLIC PANEL     Status: Abnormal   Collection Time   03/08/12  1:55 PM      Component Value Range Comment   Sodium 136  135 - 145 mEq/L    Potassium 3.5  3.5 - 5.1 mEq/L     Chloride 102  96 - 112 mEq/L    CO2 25  19 - 32 mEq/L    Glucose, Bld 94  70 - 99 mg/dL    BUN 7  6 - 23 mg/dL    Creatinine, Ser 1.30  0.50 - 1.10 mg/dL    Calcium 9.0  8.4 - 86.5 mg/dL    Total Protein 6.7  6.0 - 8.3 g/dL    Albumin 3.6  3.5 - 5.2 g/dL    AST 12  0 - 37 U/L    ALT 11  0 - 35 U/L    Alkaline Phosphatase 53  39 - 117 U/L    Total Bilirubin 2.5 (*) 0.3 - 1.2 mg/dL    GFR calc non Af Amer >90  >90 mL/min    GFR calc Af Amer >90  >90 mL/min   RETICULOCYTES     Status: Abnormal   Collection Time   03/08/12  1:55 PM      Component Value Range Comment   Retic Ct Pct 4.6 (*) 0.4 - 3.1 %    RBC. 4.12  3.87 - 5.11 MIL/uL    Retic Count, Manual 189.5 (*) 19.0 - 186.0 K/uL   CBC WITH DIFFERENTIAL     Status: Abnormal   Collection Time   03/08/12  1:55 PM      Component Value Range Comment   WBC 9.3  4.0 - 10.5 K/uL    RBC 4.12  3.87 - 5.11 MIL/uL    Hemoglobin 10.6 (*) 12.0 - 15.0 g/dL    HCT 78.4 (*) 69.6 - 46.0 %    MCV 74.5 (*) 78.0 - 100.0 fL    MCH 25.7 (*) 26.0 - 34.0 pg    MCHC 34.5  30.0 - 36.0 g/dL    RDW 29.5 (*) 28.4 - 15.5 %    Platelets 151  150 - 400 K/uL    Neutrophils Relative 71  43 - 77 %    Neutro Abs 6.5  1.7 - 7.7 K/uL    Lymphocytes Relative 22  12 - 46 %    Lymphs Abs 2.0  0.7 - 4.0 K/uL    Monocytes Relative 6  3 - 12 %    Monocytes Absolute 0.5  0.1 - 1.0 K/uL    Eosinophils Relative 2  0 - 5 %    Eosinophils Absolute 0.2  0.0 - 0.7 K/uL    Basophils Relative 0  0 - 1 %    Basophils Absolute 0.0  0.0 - 0.1 K/uL    Laboratory data reviewed.  Assessment    Sickle cell crisis. This most likely was precipitated by multiple stressors. Situational stress. Borderline hypokalemia. Sickle osteopathy the right knee predicament. This is being followed by orthopedics. Obesity. Hypertension. Sickle cell lung disease.  Plan    Hypotonic fluids for hydration. IV analgesia and IV apparatus acutely. Lorazepam by mouth 3 times a day as  needed for anxiety. Incentive spirometry. Resume home medications. Followup CBC, CMET in a.m.       Cassie Montgomery 03/08/2012, 3:52 PM

## 2012-03-08 NOTE — Discharge Summary (Signed)
[facility] Discharge Summary  Patient ID: Cassie Montgomery MRN: [id number] DOB/AGE: Jun 18, 1970 41 y.o.  Admit date: 03/08/2012 Discharge date: 03/08/2012  Admission Diagnoses:  Discharge Diagnoses:  Acute sickle cell crisis resolving. Situational stress. Borderline hypertension, improved. History of sickle cell lung disease. Obesity.  Discharged Condition: Improved.  Clinic Course: Patient admitted for further treatment of acute sickle cell crisis. She started on hypotonic saline at 100 cc an hour. She was given extra potassium for borderline low potassium level. Patient was treated with IV Dilaudid 4 mg every 2 hours when necessary. After several hours of intensive treatment, patient's pain decreased to a 5/10. Her blood pressure improved which was initially elevated at the time of her presentation as well. As the night progressed, patient had increasing problems with childcare issues. She decided she would leave tonight versus in the morning. She felt that she could continue to manage her pain at home with oral medications. She was also given a prescription for lorazepam for severe anxiety.  Consults: None.  Significant Diagnostic Studies: Hemoglobin 10.6. Total bilirubin 2.5. Potassium 3.5. Treatments: As noted.  Discharge vital signs: Blood pressure 132/76, pulse 98, temperature 98.7 F (37.1 C), temperature source Oral, resp. rate 16, height 5\' 6"  (1.676 m), weight 220 lb (99.791 kg), last menstrual period 02/20/2012, SpO2 99.00%.   Disposition: 01-Home or Self Care  Discharge Orders    Future Orders Please Complete By Expires   Diet - low sodium heart healthy      Increase activity slowly      No wound care      Call MD for:  severe uncontrolled pain      Discharge instructions      Comments:   Follow up with primary care physician in one week.       Medication List     As of 03/08/2012  9:35 PM    TAKE these medications         cholecalciferol  1000 UNITS tablet   Commonly known as: VITAMIN D   Take 1,000 Units by mouth daily.      folic acid 1 MG tablet   Commonly known as: FOLVITE   Take 1 mg by mouth daily.      LORazepam 1 MG tablet   Commonly known as: ATIVAN   Take 1 tablet (1 mg total) by mouth every 6 (six) hours as needed for anxiety.      oxyCODONE-acetaminophen 5-325 MG per tablet   Commonly known as: PERCOCET/ROXICET   1-2 tabs po q 4h prn pain         Signed: Makinzi Prieur 03/08/2012, 9:35 PM Time spent a discharge management greater than 30 minutes.

## 2012-04-03 DIAGNOSIS — D571 Sickle-cell disease without crisis: Secondary | ICD-10-CM | POA: Diagnosis not present

## 2012-04-10 ENCOUNTER — Telehealth: Payer: Self-pay | Admitting: *Deleted

## 2012-04-10 ENCOUNTER — Non-Acute Institutional Stay (HOSPITAL_COMMUNITY)
Admission: AD | Admit: 2012-04-10 | Discharge: 2012-04-11 | Disposition: A | Discharge status: Home / Self Care | Payer: Medicare Other | Attending: Internal Medicine | Admitting: Internal Medicine

## 2012-04-10 DIAGNOSIS — G8929 Other chronic pain: Secondary | ICD-10-CM | POA: Insufficient documentation

## 2012-04-10 DIAGNOSIS — E876 Hypokalemia: Secondary | ICD-10-CM | POA: Insufficient documentation

## 2012-04-10 DIAGNOSIS — F411 Generalized anxiety disorder: Secondary | ICD-10-CM | POA: Insufficient documentation

## 2012-04-10 DIAGNOSIS — M79609 Pain in unspecified limb: Secondary | ICD-10-CM | POA: Insufficient documentation

## 2012-04-10 DIAGNOSIS — R52 Pain, unspecified: Secondary | ICD-10-CM | POA: Diagnosis not present

## 2012-04-10 DIAGNOSIS — D5700 Hb-SS disease with crisis, unspecified: Secondary | ICD-10-CM | POA: Diagnosis not present

## 2012-04-10 DIAGNOSIS — G43909 Migraine, unspecified, not intractable, without status migrainosus: Secondary | ICD-10-CM | POA: Diagnosis not present

## 2012-04-10 DIAGNOSIS — D473 Essential (hemorrhagic) thrombocythemia: Secondary | ICD-10-CM | POA: Diagnosis not present

## 2012-04-10 DIAGNOSIS — M25562 Pain in left knee: Secondary | ICD-10-CM

## 2012-04-10 LAB — CBC WITH DIFFERENTIAL/PLATELET
Basophils Absolute: 0 10*3/uL (ref 0.0–0.1)
Basophils Relative: 0 % (ref 0–1)
Eosinophils Absolute: 0.1 10*3/uL (ref 0.0–0.7)
Hemoglobin: 9.9 g/dL — ABNORMAL LOW (ref 12.0–15.0)
Lymphocytes Relative: 27 % (ref 12–46)
MCH: 26 pg (ref 26.0–34.0)
MCHC: 34.9 g/dL (ref 30.0–36.0)
Monocytes Absolute: 0.5 10*3/uL (ref 0.1–1.0)
Neutro Abs: 5.2 10*3/uL (ref 1.7–7.7)
Neutrophils Relative %: 66 % (ref 43–77)
RDW: 17.1 % — ABNORMAL HIGH (ref 11.5–15.5)

## 2012-04-10 LAB — RETICULOCYTES
RBC.: 3.81 MIL/uL — ABNORMAL LOW (ref 3.87–5.11)
Retic Count, Absolute: 243.8 10*3/uL — ABNORMAL HIGH (ref 19.0–186.0)

## 2012-04-10 LAB — COMPREHENSIVE METABOLIC PANEL
AST: 17 U/L (ref 0–37)
BUN: 11 mg/dL (ref 6–23)
CO2: 27 mEq/L (ref 19–32)
Calcium: 9.7 mg/dL (ref 8.4–10.5)
Chloride: 103 mEq/L (ref 96–112)
Creatinine, Ser: 0.7 mg/dL (ref 0.50–1.10)
GFR calc Af Amer: >90 mL/min (ref >90)
GFR calc non Af Amer: >90 mL/min (ref >90)
Glucose, Bld: 108 mg/dL — ABNORMAL HIGH (ref 70–99)
Total Bilirubin: 4.1 mg/dL — ABNORMAL HIGH (ref 0.3–1.2)

## 2012-04-10 MED ORDER — DIPHENHYDRAMINE HCL 25 MG PO CAPS
25.0000 mg | ORAL_CAPSULE | Freq: Once | ORAL | Status: AC
  Administered 2012-04-10: 25 mg via ORAL

## 2012-04-10 MED ORDER — ONDANSETRON HCL 4 MG PO TABS: 4.0000 mg | ORAL_TABLET | ORAL | Status: DC | PRN

## 2012-04-10 MED ORDER — DIPHENHYDRAMINE HCL 25 MG PO CAPS
25.0000 mg | ORAL_CAPSULE | ORAL | Status: DC | PRN
  Administered 2012-04-11 (×2): 50 mg via ORAL
  Filled 2012-04-10: qty 2
  Filled 2012-04-10: qty 1
  Filled 2012-04-10: qty 2

## 2012-04-10 MED ORDER — FOLIC ACID 1 MG PO TABS: 1.0000 mg | ORAL_TABLET | Freq: Every day | ORAL | Status: DC

## 2012-04-10 MED ORDER — LORAZEPAM 1 MG PO TABS
1.0000 mg | ORAL_TABLET | Freq: Four times a day (QID) | ORAL | Status: DC | PRN
  Administered 2012-04-10 – 2012-04-11 (×2): 1 mg via ORAL
  Filled 2012-04-10 (×2): qty 1

## 2012-04-10 MED ORDER — KETOROLAC TROMETHAMINE 30 MG/ML IJ SOLN
30.0000 mg | Freq: Once | INTRAMUSCULAR | Status: AC
  Administered 2012-04-10: 30 mg via INTRAVENOUS
  Filled 2012-04-10: qty 1

## 2012-04-10 MED ORDER — DIPHENHYDRAMINE HCL 50 MG/ML IJ SOLN: 25.0000 mg | Freq: Once | INTRAMUSCULAR | Status: AC

## 2012-04-10 MED ORDER — HYDROMORPHONE HCL PF 2 MG/ML IJ SOLN
2.0000 mg | INTRAMUSCULAR | Status: DC | PRN
  Administered 2012-04-10: 3 mg via INTRAVENOUS
  Administered 2012-04-10: 2 mg via INTRAVENOUS
  Administered 2012-04-10: 4 mg via INTRAVENOUS
  Administered 2012-04-10: 2 mg via INTRAVENOUS
  Administered 2012-04-10: 3 mg via INTRAVENOUS
  Administered 2012-04-11 (×3): 4 mg via INTRAVENOUS
  Filled 2012-04-10 (×5): qty 2
  Filled 2012-04-10: qty 1
  Filled 2012-04-10: qty 2
  Filled 2012-04-10: qty 1

## 2012-04-10 MED ORDER — ONDANSETRON HCL 4 MG/2ML IJ SOLN: 4.0000 mg | INTRAMUSCULAR | Status: DC | PRN

## 2012-04-10 MED ORDER — VITAMIN D3 25 MCG (1000 UNIT) PO TABS
1000.0000 [IU] | ORAL_TABLET | Freq: Every day | ORAL | Status: DC
  Filled 2012-04-10: qty 1

## 2012-04-10 MED ORDER — POTASSIUM CHLORIDE CRYS ER 20 MEQ PO TBCR
40.0000 meq | EXTENDED_RELEASE_TABLET | Freq: Once | ORAL | Status: AC
  Administered 2012-04-10: 40 meq via ORAL
  Filled 2012-04-10 (×2): qty 2

## 2012-04-10 MED ORDER — DIPHENHYDRAMINE HCL 50 MG/ML IJ SOLN
12.5000 mg | INTRAMUSCULAR | Status: DC | PRN
  Administered 2012-04-10: 25 mg via INTRAVENOUS
  Filled 2012-04-10: qty 1

## 2012-04-10 MED ORDER — DEXTROSE-NACL 5-0.45 % IV SOLN
INTRAVENOUS | Status: DC
  Administered 2012-04-10 – 2012-04-11 (×2): via INTRAVENOUS

## 2012-04-10 NOTE — Telephone Encounter (Signed)
Called [facility] to report onset 04/09/12 of pain in feet, ankles, and legs, rated 8/10, without fever, diarrhea, chest or abdominal pain. Reports slight nausea, poor appetite. NP notified; return call to patient to authorize admission to [facility], expects to arrive in approx 30 min.

## 2012-04-10 NOTE — H&P (Signed)
[facility] History and Physical   Date: 04/10/2012  Patient name: Cassie Montgomery Medical record number: [id number] Date of birth: 09/18/1970 Age: 41 y.o. Gender: female PCP: [name], ERIC, MD  Attending physician: Gwenyth Bender, MD  Chief Complaint: Pain in lower extremities and feet  History of Present Illness: Cassie Montgomery is a 41 year old African American female with hx of sickle cell disease, migraines with aura , hypokalemia and thrombocytopenia.  Pain started escalating yesterday and not manageable at with home pain regiment. Called the [facility] requesting to be evaluated and treated.  Meds: Prescriptions prior to admission  Medication Sig Dispense Refill  . cholecalciferol (VITAMIN D) 1000 UNITS tablet Take 1,000 Units by mouth daily.      . folic acid (FOLVITE) 1 MG tablet Take 1 mg by mouth daily.      Marland Kitchen LORazepam (ATIVAN) 1 MG tablet Take 1 tablet (1 mg total) by mouth every 6 (six) hours as needed for anxiety.  30 tablet  0  . oxyCODONE-acetaminophen (PERCOCET/ROXICET) 5-325 MG per tablet 1-2 tabs po q 4h prn pain  60 tablet  0    Allergies: Sulfa antibiotics Past Medical History  Diagnosis Date  . PMDD (premenstrual dysphoric disorder)   . Sickle cell anemia   . Sickle cell disease without crisis 07/15/2011  . Migraine with aura 07/15/2011   Past Surgical History  Procedure Date  . Cesarean section    Family History  Problem Relation Age of Onset  . Hypertension Mother    History   Social History  . Marital Status: Single    Spouse Name: N/A    Number of Children: N/A  . Years of Education: N/A   Occupational History  . Not on file.   Social History Main Topics  . Smoking status: Never Smoker   . Smokeless tobacco: Not on file  . Alcohol Use: No  . Drug Use: No  . Sexually Active:    Other Topics Concern  . Not on file   Social History Narrative  . No narrative on file    Review of Systems: Pertinent items are noted in  HPI.  Physical Exam: Blood pressure 134/87, pulse 95, temperature 98.1 F (36.7 C), temperature source Oral, resp. rate 20, height 5\' 6"  (1.676 m), weight 99.791 kg (220 lb), SpO2 100.00%.  Physical Exam  General appearance: Alert and oriented, well nourished, well developed, appears stated age, moderate distress  Head: Normocephalic, without obvious abnormality, atraumatic, no sinus tenderness  Eyes: Conjunctivae/corneas clear, PERRLA, EOMI moderate sclera  icterus, Neck: No adenopathy, supple, symmetrical, trachea midline and thyroid not enlarged, symmetric, no tenderness/mass/nodules  Back: Symmetric, bilateral CVA tenderness  Lungs: Diminished breath sounds bibasilar and bilaterally, CTA, no wheezes/rales/rhonchi  Heart: regular rate and rhythm, S1, S2 normal, no murmur, click, rub or gallop and left chest accessed mediport  Abdomen: Soft, diffuse tenderness, distended, rotunded  hypoactive bowel sounds, no masses, no organomegaly  Extremities: Homans sign is negative, no sign of DVT, +1 bilateral LE edema, bilateral LE weakness and tenderness, UE limited ROM with some tenderness  Neurologic: Grossly normal, bilateral LE weakness, AO x3, no focal deficits, CN II-XII intact  Psych: Appropriate affect  Lab results: Results for orders placed during the hospital encounter of 04/10/12 (from the past 24 hour(s))  COMPREHENSIVE METABOLIC PANEL     Status: Abnormal   Collection Time   04/10/12  4:36 PM      Component Value Range   Sodium 140  135 - 145 mEq/L   Potassium 3.4 (*) 3.5 - 5.1 mEq/L   Chloride 103  96 - 112 mEq/L   CO2 27  19 - 32 mEq/L   Glucose, Bld 108 (*) 70 - 99 mg/dL   BUN 11  6 - 23 mg/dL   Creatinine, Ser 4.78  0.50 - 1.10 mg/dL   Calcium 9.7  8.4 - 29.5 mg/dL   Total Protein 7.6  6.0 - 8.3 g/dL   Albumin 4.0  3.5 - 5.2 g/dL   AST 17  0 - 37 U/L   ALT 13  0 - 35 U/L   Alkaline Phosphatase 58  39 - 117 U/L   Total Bilirubin 4.1 (*) 0.3 - 1.2 mg/dL   GFR calc non  Af Amer >90  >90 mL/min   GFR calc Af Amer >90  >90 mL/min  RETICULOCYTES     Status: Abnormal   Collection Time   04/10/12  4:36 PM      Component Value Range   Retic Ct Pct 6.4 (*) 0.4 - 3.1 %   RBC. 3.81 (*) 3.87 - 5.11 MIL/uL   Retic Count, Manual 243.8 (*) 19.0 - 186.0 K/uL  CBC WITH DIFFERENTIAL     Status: Abnormal   Collection Time   04/10/12  4:36 PM      Component Value Range   WBC 8.0  4.0 - 10.5 K/uL   RBC 3.81 (*) 3.87 - 5.11 MIL/uL   Hemoglobin 9.9 (*) 12.0 - 15.0 g/dL   HCT 62.1 (*) 30.8 - 65.7 %   MCV 74.5 (*) 78.0 - 100.0 fL   MCH 26.0  26.0 - 34.0 pg   MCHC 34.9  30.0 - 36.0 g/dL   RDW 84.6 (*) 96.2 - 95.2 %   Platelets 126 (*) 150 - 400 K/uL   Neutrophils Relative 66  43 - 77 %   Lymphocytes Relative 27  12 - 46 %   Monocytes Relative 6  3 - 12 %   Eosinophils Relative 1  0 - 5 %   Basophils Relative 0  0 - 1 %   Neutro Abs 5.2  1.7 - 7.7 K/uL   Lymphs Abs 2.2  0.7 - 4.0 K/uL   Monocytes Absolute 0.5  0.1 - 1.0 K/uL   Eosinophils Absolute 0.1  0.0 - 0.7 K/uL   Basophils Absolute 0.0  0.0 - 0.1 K/uL   RBC Morphology RARE NRBCs      Imaging results:  No results found.   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

## 2012-04-11 LAB — CBC WITH DIFFERENTIAL/PLATELET
Basophils Absolute: 0 10*3/uL (ref 0.0–0.1)
Eosinophils Relative: 3 % (ref 0–5)
HCT: 24.7 % — ABNORMAL LOW (ref 36.0–46.0)
Hemoglobin: 8.5 g/dL — ABNORMAL LOW (ref 12.0–15.0)
Lymphocytes Relative: 34 % (ref 12–46)
Lymphs Abs: 2.3 10*3/uL (ref 0.7–4.0)
MCV: 74.6 fL — ABNORMAL LOW (ref 78.0–100.0)
Monocytes Absolute: 0.5 10*3/uL (ref 0.1–1.0)
Monocytes Relative: 8 % (ref 3–12)
Neutro Abs: 3.7 10*3/uL (ref 1.7–7.7)
RBC: 3.31 MIL/uL — ABNORMAL LOW (ref 3.87–5.11)
WBC: 6.7 10*3/uL (ref 4.0–10.5)

## 2012-04-11 LAB — COMPREHENSIVE METABOLIC PANEL
Albumin: 3.4 g/dL — ABNORMAL LOW (ref 3.5–5.2)
BUN: 12 mg/dL (ref 6–23)
Calcium: 9.1 mg/dL (ref 8.4–10.5)
GFR calc Af Amer: >90 mL/min (ref >90)
Glucose, Bld: 106 mg/dL — ABNORMAL HIGH (ref 70–99)
Potassium: 3.7 mEq/L (ref 3.5–5.1)
Sodium: 136 mEq/L (ref 135–145)
Total Protein: 6.4 g/dL (ref 6.0–8.3)

## 2012-04-11 LAB — FERRITIN: Ferritin: 54 ng/mL (ref 10–291)

## 2012-04-11 MED ORDER — OXYCODONE-ACETAMINOPHEN 5-325 MG PO TABS: ORAL_TABLET | ORAL | Status: DC

## 2012-04-11 NOTE — Progress Notes (Signed)
Pt states pain level at 5/10; pt. States that she wants to go home; discharge orders received; Patient given discharge instructions, all questions answered; no complications noted

## 2012-04-11 NOTE — Progress Notes (Signed)
Pt has trouble relaxing, becomes anxious at times. Able to decrease her anxiety by talking to her. Pain seems to improve at times, but up and down. Attempted to decrease her prescribed dilaudid based on the parameters in the order, but c/o increased pain. Ativan given per prn order, and that seems to help with anxiety. Pt is pleasant and friendly at all times. Tolerating po fluid, and food. Ambulating to restroom with no assistance.

## 2012-04-11 NOTE — Discharge Summary (Signed)
[facility] Discharge Summary   Patient ID: Cassie Montgomery MRN: [id number] DOB/AGE: [birth year] 41 y.o.  Admit date: 04/10/2012 Discharge date: 04/11/2012  Primary Care Physician:  Willey Blade, MD  Admission Diagnoses:  Active Problems:  Vaso occlusive crisis  Anxiety  Migraines  Discharge Diagnoses:   Vaso occlusive crisis  Anxiety  Migraines   Discharge Medications:    Medication List     As of 04/11/2012  7:45 AM    ASK your doctor about these medications         cholecalciferol 1000 UNITS tablet   Commonly known as: VITAMIN D   Take 1,000 Units by mouth daily.      folic acid 1 MG tablet   Commonly known as: FOLVITE   Take 1 mg by mouth daily.      LORazepam 1 MG tablet   Commonly known as: ATIVAN   Take 1 tablet (1 mg total) by mouth every 6 (six) hours as needed for anxiety.      oxyCODONE-acetaminophen 5-325 MG per tablet   Commonly known as: PERCOCET/ROXICET   1-2 tabs po q 4h prn pain         Consults:  None   Significant Diagnostic Studies:  No results found.   [facility] Course:  For complete details please refer to admission H and P, but in brief, Cassie Montgomery is a 41 year old African American with hx of sickle cell disease, migraines with aura , hypokalemia and thrombocytopenia. Pain started escalating yesterday and not manageable at with home pain regiment. Called the [facility] requesting to be evaluated and treated. She was treated for acute on chronic pain with IV diaulaid,  Benadryl for pruritus and antiinflammatory process. Vaso occlusive crisis with active hemolysis on admission t. Bili was 4.1 on d/c indicating resolving crisis which is trending down to 3.1. Anxiety resumes ativan probable change in environment. Hemolytic anemia: secondary to SCD on admission 9.9 upon d/c 8.5 stable.     Physical Exam at Discharge:  BP 121/72  Pulse 72  Temp 97.9 F (36.6 C) (Oral)  Resp 18  Ht 5\' 6"  (1.676 m)  Wt  99.791 kg (220 lb)  BMI 35.51 kg/m2  SpO2 100%  LMP 02/17/2012 General appearance: Alert and oriented, well nourished, well developed, appears stated age, moderate distress  Eyes: Conjunctivae/corneas clear, PERRLA, EOMI moderate sclera icterus,  Back: Symmetric, bilateral CVA tenderness  Lungs: Diminished breath sounds bibasilar and bilaterally, CTA, no wheezes/rales/rhonchi  Heart: regular rate and rhythm, S1, S2 normal, no murmur, click, rub or gallop and left chest accessed mediport  Abdomen: Soft, diffuse tenderness, distended, rotunded hypoactive bowel sounds, no masses, no organomegaly  Extremities: Homans sign is negative, no sign of DVT, +1 bilateral LE edema, bilateral LE weakness and tenderness, UE limited ROM with some tenderness  Psych: Anxious    Disposition at Discharge: -Home or Self Care  Discharge Orders: See summary   Condition at Discharge:   Stable  Time spent on Discharge:  Greater than 30 minutes.  Signed: Anyia Gierke P 04/11/2012, 7:45 AM

## 2012-04-16 LAB — HEMOGLOBINOPATHY EVALUATION
Hemoglobin Other: 38.2 % — ABNORMAL HIGH
Hgb A: 0 % — ABNORMAL LOW (ref 96.8–97.8)
Hgb S Quant: 49.3 % — ABNORMAL HIGH

## 2012-04-19 ENCOUNTER — Encounter (HOSPITAL_COMMUNITY): Payer: Self-pay | Admitting: *Deleted

## 2012-04-19 ENCOUNTER — Emergency Department (HOSPITAL_COMMUNITY): Payer: Medicare Other

## 2012-04-19 ENCOUNTER — Emergency Department (HOSPITAL_COMMUNITY)
Admission: EM | Admit: 2012-04-19 | Discharge: 2012-04-19 | Disposition: A | Discharge status: Home / Self Care | Payer: Medicare Other | Attending: Emergency Medicine | Admitting: Emergency Medicine

## 2012-04-19 DIAGNOSIS — Z3202 Encounter for pregnancy test, result negative: Secondary | ICD-10-CM | POA: Diagnosis not present

## 2012-04-19 DIAGNOSIS — M549 Dorsalgia, unspecified: Secondary | ICD-10-CM | POA: Insufficient documentation

## 2012-04-19 DIAGNOSIS — R109 Unspecified abdominal pain: Secondary | ICD-10-CM | POA: Diagnosis not present

## 2012-04-19 DIAGNOSIS — N943 Premenstrual tension syndrome: Secondary | ICD-10-CM | POA: Insufficient documentation

## 2012-04-19 DIAGNOSIS — Z8679 Personal history of other diseases of the circulatory system: Secondary | ICD-10-CM | POA: Insufficient documentation

## 2012-04-19 DIAGNOSIS — Z79899 Other long term (current) drug therapy: Secondary | ICD-10-CM | POA: Insufficient documentation

## 2012-04-19 DIAGNOSIS — Z791 Long term (current) use of non-steroidal anti-inflammatories (NSAID): Secondary | ICD-10-CM | POA: Insufficient documentation

## 2012-04-19 DIAGNOSIS — D5700 Hb-SS disease with crisis, unspecified: Secondary | ICD-10-CM | POA: Diagnosis not present

## 2012-04-19 DIAGNOSIS — D571 Sickle-cell disease without crisis: Secondary | ICD-10-CM | POA: Diagnosis not present

## 2012-04-19 LAB — COMPREHENSIVE METABOLIC PANEL
ALT: 11 U/L (ref 0–35)
Albumin: 4.2 g/dL (ref 3.5–5.2)
Alkaline Phosphatase: 54 U/L (ref 39–117)
BUN: 16 mg/dL (ref 6–23)
Chloride: 103 mEq/L (ref 96–112)
GFR calc Af Amer: >90 mL/min (ref >90)
Glucose, Bld: 102 mg/dL — ABNORMAL HIGH (ref 70–99)
Potassium: 3.7 mEq/L (ref 3.5–5.1)
Sodium: 139 mEq/L (ref 135–145)
Total Bilirubin: 3.4 mg/dL — ABNORMAL HIGH (ref 0.3–1.2)
Total Protein: 7.9 g/dL (ref 6.0–8.3)

## 2012-04-19 LAB — CBC WITH DIFFERENTIAL/PLATELET
Eosinophils Absolute: 0.3 10*3/uL (ref 0.0–0.7)
Hemoglobin: 10.7 g/dL — ABNORMAL LOW (ref 12.0–15.0)
Lymphocytes Relative: 27 % (ref 12–46)
Lymphs Abs: 2.9 10*3/uL (ref 0.7–4.0)
Monocytes Relative: 6 % (ref 3–12)
Neutro Abs: 7 10*3/uL (ref 1.7–7.7)
Neutrophils Relative %: 65 % (ref 43–77)
Platelets: 128 10*3/uL — ABNORMAL LOW (ref 150–400)
RBC: 4.14 MIL/uL (ref 3.87–5.11)
WBC: 10.9 10*3/uL — ABNORMAL HIGH (ref 4.0–10.5)

## 2012-04-19 LAB — PREGNANCY, URINE: Preg Test, Ur: NEGATIVE

## 2012-04-19 LAB — URINALYSIS, ROUTINE W REFLEX MICROSCOPIC
Glucose, UA: NEGATIVE mg/dL
Hgb urine dipstick: NEGATIVE
Leukocytes, UA: NEGATIVE
Protein, ur: 30 mg/dL — AB
pH: 5.5 (ref 5.0–8.0)

## 2012-04-19 LAB — URINE MICROSCOPIC-ADD ON

## 2012-04-19 LAB — RETICULOCYTES: Retic Ct Pct: 5 % — ABNORMAL HIGH (ref 0.4–3.1)

## 2012-04-19 MED ORDER — IOHEXOL 300 MG/ML  SOLN
100.0000 mL | Freq: Once | INTRAMUSCULAR | Status: AC | PRN
  Administered 2012-04-19: 100 mL via INTRAVENOUS

## 2012-04-19 MED ORDER — ONDANSETRON HCL 4 MG/2ML IJ SOLN
4.0000 mg | Freq: Once | INTRAMUSCULAR | Status: AC
  Administered 2012-04-19: 4 mg via INTRAVENOUS
  Filled 2012-04-19: qty 2

## 2012-04-19 MED ORDER — SODIUM CHLORIDE 0.9 % IV SOLN
Freq: Once | INTRAVENOUS | Status: AC
  Administered 2012-04-19: 125 mL/h via INTRAVENOUS

## 2012-04-19 MED ORDER — DIPHENHYDRAMINE HCL 50 MG/ML IJ SOLN
12.5000 mg | Freq: Once | INTRAMUSCULAR | Status: AC
  Administered 2012-04-19: 12.5 mg via INTRAVENOUS
  Filled 2012-04-19: qty 1

## 2012-04-19 MED ORDER — DIPHENHYDRAMINE HCL 50 MG/ML IJ SOLN: 25.0000 mg | Freq: Once | INTRAMUSCULAR | Status: DC

## 2012-04-19 MED ORDER — SODIUM CHLORIDE 0.9 % IV BOLUS (SEPSIS)
1000.0000 mL | Freq: Once | INTRAVENOUS | Status: AC
  Administered 2012-04-19: 1000 mL via INTRAVENOUS

## 2012-04-19 MED ORDER — HYDROMORPHONE HCL PF 2 MG/ML IJ SOLN
2.0000 mg | INTRAMUSCULAR | Status: DC | PRN
  Administered 2012-04-19: 2 mg via INTRAVENOUS
  Filled 2012-04-19: qty 1

## 2012-04-19 MED ORDER — HYDROMORPHONE HCL PF 1 MG/ML IJ SOLN
1.0000 mg | Freq: Once | INTRAMUSCULAR | Status: AC
  Administered 2012-04-19: 1 mg via INTRAVENOUS
  Filled 2012-04-19: qty 1

## 2012-04-19 MED ORDER — HYDROMORPHONE HCL PF 2 MG/ML IJ SOLN
2.0000 mg | Freq: Once | INTRAMUSCULAR | Status: AC
  Administered 2012-04-19: 2 mg via INTRAVENOUS
  Filled 2012-04-19: qty 1

## 2012-04-19 MED ORDER — DIPHENHYDRAMINE HCL 25 MG PO CAPS
25.0000 mg | ORAL_CAPSULE | Freq: Once | ORAL | Status: AC
  Administered 2012-04-19: 25 mg via ORAL
  Filled 2012-04-19: qty 1

## 2012-04-19 NOTE — ED Notes (Addendum)
Pt from home with c/o sickle cell pain crisis. Sts that she is having cramping in her back, pelvic pain and left side pain that are unusual for her sickle cell crisis. Sts bilateral leg pain more in her left than right. Denies shob. Took oxycodone at 8pm and again at 9:30pm for pain relief.

## 2012-04-19 NOTE — ED Provider Notes (Signed)
0600 report received from [place] PA on this 41 year old female coming in with left flank pain. Awaiting CT scan results before dispositioning.  0800 Patient continues to have pain in her Lower abdomen with cramps.  States was time for her period the first of the month.   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  Patients pain is better now and she is ready for discharge.  She will follow up with Dr. [name] next week and continue to take her pain meds at home as needed.  She understands to return here or the sickle cell clinic for worsening symptoms.      Remi Haggard, NP 04/19/12 [phone]

## 2012-04-19 NOTE — ED Provider Notes (Signed)
History     CSN: [id number]  Arrival date & time 04/19/12  0204   First MD Initiated Contact with Patient 04/19/12 0225      Chief Complaint  Patient presents with  . Sickle Cell Pain Crisis   HPI  History provided by the patient. Patient is a 41 year old female with history of sickle cell anemia who presents with complaints of leg pain and side and back pains. Symptoms have been worsening over the past few days. Patient states that she is unsure if these are sickle cell pains or something different. Pains are slightly atypical for sickle cell in that they are cramping in nature. Pain is primary located in the back, left flank and lower abdomen area. She does also have some pains and discomforts in her thighs bilaterally. These pains feel more like sickle cell pains in her sharp. She denies having any swelling of the extremities. Patient has been using her normal medications regularly as prescribed. She also took oxycodone at 8 PM and again at 9:30 PM without significant relief of symptoms. She denies any other associated symptoms. Denies any fever, chills or sweats.    Past Medical History  Diagnosis Date  . PMDD (premenstrual dysphoric disorder)   . Sickle cell anemia   . Sickle cell disease without crisis 07/15/2011  . Migraine with aura 07/15/2011    Past Surgical History  Procedure Date  . Cesarean section     Family History  Problem Relation Age of Onset  . Hypertension Mother     History  Substance Use Topics  . Smoking status: Never Smoker   . Smokeless tobacco: Not on file  . Alcohol Use: No    OB History    Grav Para Term Preterm Abortions TAB SAB Ect Mult Living                  Review of Systems  Constitutional: Negative for fever, chills and diaphoresis.  Respiratory: Negative for cough and shortness of breath.   Cardiovascular: Negative for chest pain.  Gastrointestinal: Positive for nausea and abdominal pain. Negative for vomiting, diarrhea and  constipation.  Genitourinary: Positive for pelvic pain. Negative for dysuria, frequency, hematuria, flank pain, vaginal bleeding and vaginal discharge.  Musculoskeletal: Positive for back pain.  All other systems reviewed and are negative.    Allergies  Sulfa antibiotics  Home Medications   Current Outpatient Rx  Name  Route  Sig  Dispense  Refill  . VITAMIN D 1000 UNITS PO TABS   Oral   Take 1,000 Units by mouth daily.         Marland Kitchen FOLIC ACID 1 MG PO TABS   Oral   Take 1 mg by mouth daily.         Marland Kitchen LEVALBUTEROL TARTRATE 45 MCG/ACT IN AERO   Inhalation   Inhale 2 puffs into the lungs 2 (two) times daily as needed. For shortness of breath         . LORAZEPAM 1 MG PO TABS   Oral   Take 1 tablet (1 mg total) by mouth every 6 (six) hours as needed for anxiety.   30 tablet   0   . OXYCODONE-ACETAMINOPHEN 5-325 MG PO TABS   Oral   Take 1-2 tablets by mouth every 4 (four) hours as needed. For pain           BP 108/71  Pulse 91  Temp 97.3 F (36.3 C) (Oral)  Resp 18  Ht 5\' 6"  (  1.676 m)  Wt 221 lb (100.245 kg)  BMI 35.67 kg/m2  SpO2 99%  LMP 02/17/2012  Physical Exam  Nursing note and vitals reviewed. Constitutional: She is oriented to person, place, and time. She appears well-developed and well-nourished. No distress.  HENT:  Head: Normocephalic.  Cardiovascular: Normal rate and regular rhythm.   No murmur heard. Pulmonary/Chest: Effort normal and breath sounds normal. No respiratory distress. She has no wheezes. She has no rales.  Abdominal: Soft. There is splenomegaly. There is tenderness in the left upper quadrant. There is no rebound, no guarding and no CVA tenderness.         Patient with enlarged spleen with tenderness over this area. No peritoneal signs.  Neurological: She is alert and oriented to person, place, and time.  Skin: Skin is warm and dry. No rash noted.  Psychiatric: She has a normal mood and affect. Her behavior is normal.    ED  Course  Procedures   Results for orders placed during the hospital encounter of 04/19/12  CBC WITH DIFFERENTIAL      Component Value Range   WBC 10.9 (*) 4.0 - 10.5 K/uL   RBC 4.14  3.87 - 5.11 MIL/uL   Hemoglobin 10.7 (*) 12.0 - 15.0 g/dL   HCT 16.1 (*) 09.6 - 04.5 %   MCV 73.2 (*) 78.0 - 100.0 fL   MCH 25.8 (*) 26.0 - 34.0 pg   MCHC 35.3  30.0 - 36.0 g/dL   RDW 40.9 (*) 81.1 - 91.4 %   Platelets 128 (*) 150 - 400 K/uL   Neutrophils Relative 65  43 - 77 %   Neutro Abs 7.0  1.7 - 7.7 K/uL   Lymphocytes Relative 27  12 - 46 %   Lymphs Abs 2.9  0.7 - 4.0 K/uL   Monocytes Relative 6  3 - 12 %   Monocytes Absolute 0.6  0.1 - 1.0 K/uL   Eosinophils Relative 2  0 - 5 %   Eosinophils Absolute 0.3  0.0 - 0.7 K/uL   Basophils Relative 0  0 - 1 %   Basophils Absolute 0.0  0.0 - 0.1 K/uL  COMPREHENSIVE METABOLIC PANEL      Component Value Range   Sodium 139  135 - 145 mEq/L   Potassium 3.7  3.5 - 5.1 mEq/L   Chloride 103  96 - 112 mEq/L   CO2 26  19 - 32 mEq/L   Glucose, Bld 102 (*) 70 - 99 mg/dL   BUN 16  6 - 23 mg/dL   Creatinine, Ser 7.82  0.50 - 1.10 mg/dL   Calcium 9.5  8.4 - 95.6 mg/dL   Total Protein 7.9  6.0 - 8.3 g/dL   Albumin 4.2  3.5 - 5.2 g/dL   AST 13  0 - 37 U/L   ALT 11  0 - 35 U/L   Alkaline Phosphatase 54  39 - 117 U/L   Total Bilirubin 3.4 (*) 0.3 - 1.2 mg/dL   GFR calc non Af Amer 80 (*) >90 mL/min   GFR calc Af Amer >90  >90 mL/min  RETICULOCYTES      Component Value Range   Retic Ct Pct 5.0 (*) 0.4 - 3.1 %   RBC. 4.14  3.87 - 5.11 MIL/uL   Retic Count, Manual 207.0 (*) 19.0 - 186.0 K/uL  URINALYSIS, ROUTINE W REFLEX MICROSCOPIC      Component Value Range   Color, Urine YELLOW  YELLOW   APPearance CLOUDY (*)  CLEAR   Specific Gravity, Urine 1.020  1.005 - 1.030   pH 5.5  5.0 - 8.0   Glucose, UA NEGATIVE  NEGATIVE mg/dL   Hgb urine dipstick NEGATIVE  NEGATIVE   Bilirubin Urine NEGATIVE  NEGATIVE   Ketones, ur NEGATIVE  NEGATIVE mg/dL   Protein, ur 30  (*) NEGATIVE mg/dL   Urobilinogen, UA 1.0  0.0 - 1.0 mg/dL   Nitrite NEGATIVE  NEGATIVE   Leukocytes, UA NEGATIVE  NEGATIVE  URINE MICROSCOPIC-ADD ON      Component Value Range   Squamous Epithelial / LPF RARE  RARE   Urine-Other MUCOUS PRESENT          Ct Abdomen Pelvis W Contrast  04/19/2012  *RADIOLOGY REPORT*  Clinical Data: Splenomegaly.  Sickle cell.  Pain.  CT ABDOMEN AND PELVIS WITH CONTRAST  Technique:  Multidetector CT imaging of the abdomen and pelvis was performed following the standard protocol during bolus administration of intravenous contrast.  Contrast: OMNIPAQUE IOHEXOL 300 MG/ML  SOLN  Comparison: 07/27/2008  Findings: Stable low density lesion within the right hepatic lobe measuring 17 mm.  I suspect this represents a hemangioma.  There is splenomegaly.  Craniocaudal length of the spleen is approximately 14.5 cm compared with a 11.9 cm previously.  No focal lesion in the spleen.  Gallbladder, stomach, pancreas, adrenals and kidneys are normal.  Uterus, adnexa urinary bladder unremarkable.  Appendix is visualized and is normal. Bowel grossly unremarkable.  No free fluid, free air, or adenopathy.  Aorta is normal caliber.  No acute bony abnormality.  IMPRESSION: Splenomegaly, enlarging further since prior study.  Stable 17 mm low density lesion within the right hepatic lobe. Favor hemangioma.   Original Report Authenticated By: Charlett Nose, M.D.      No diagnosis found.    MDM  2:40AM patient seen and evaluated. Patient is resting comfortably and appears in no acute distress.  Pt discussed in sign out with Remi Haggard NP.  She will follow up on CT scan results.      Angus Seller, [place] 04/19/12 [phone]

## 2012-04-20 NOTE — ED Provider Notes (Signed)
Medical screening examination/treatment/procedure(s) were performed by non-physician practitioner and as supervising physician I was immediately available for consultation/collaboration.   Malahki Gasaway, MD 04/20/12 0142 

## 2012-04-20 NOTE — ED Provider Notes (Signed)
Medical screening examination/treatment/procedure(s) were performed by non-physician practitioner and as supervising physician I was immediately available for consultation/collaboration.   Rolan Bucco, MD 04/20/12 [phone]

## 2012-06-11 NOTE — Telephone Encounter (Signed)
See phone call 

## 2012-06-18 ENCOUNTER — Encounter: Payer: Self-pay | Admitting: Hematology

## 2012-06-23 NOTE — Telephone Encounter (Signed)
Close encounter 

## 2012-08-20 ENCOUNTER — Other Ambulatory Visit: Payer: Self-pay | Admitting: Internal Medicine

## 2012-08-20 DIAGNOSIS — Z1231 Encounter for screening mammogram for malignant neoplasm of breast: Secondary | ICD-10-CM

## 2012-09-11 ENCOUNTER — Ambulatory Visit: Payer: Medicare Other

## 2012-09-11 DIAGNOSIS — Z79891 Long term (current) use of opiate analgesic: Secondary | ICD-10-CM | POA: Insufficient documentation

## 2012-09-17 ENCOUNTER — Other Ambulatory Visit: Payer: Self-pay | Admitting: Internal Medicine

## 2012-09-17 ENCOUNTER — Encounter: Payer: Self-pay | Admitting: *Deleted

## 2012-09-17 ENCOUNTER — Other Ambulatory Visit (HOSPITAL_COMMUNITY)
Admission: RE | Admit: 2012-09-17 | Discharge: 2012-09-17 | Disposition: A | Discharge status: Home / Self Care | Payer: Medicare Other | Source: Ambulatory Visit | Attending: Internal Medicine | Admitting: Internal Medicine

## 2012-09-17 DIAGNOSIS — Z124 Encounter for screening for malignant neoplasm of cervix: Secondary | ICD-10-CM | POA: Diagnosis not present

## 2012-09-17 DIAGNOSIS — N949 Unspecified condition associated with female genital organs and menstrual cycle: Secondary | ICD-10-CM

## 2012-09-17 DIAGNOSIS — N939 Abnormal uterine and vaginal bleeding, unspecified: Secondary | ICD-10-CM

## 2012-09-21 ENCOUNTER — Ambulatory Visit
Admission: RE | Admit: 2012-09-21 | Discharge: 2012-09-21 | Disposition: A | Discharge status: Home / Self Care | Payer: Medicare Other | Source: Ambulatory Visit | Attending: Internal Medicine | Admitting: Internal Medicine

## 2012-09-21 DIAGNOSIS — N949 Unspecified condition associated with female genital organs and menstrual cycle: Secondary | ICD-10-CM

## 2012-09-21 DIAGNOSIS — N939 Abnormal uterine and vaginal bleeding, unspecified: Secondary | ICD-10-CM

## 2012-09-21 DIAGNOSIS — D259 Leiomyoma of uterus, unspecified: Secondary | ICD-10-CM | POA: Diagnosis not present

## 2012-09-22 ENCOUNTER — Telehealth: Payer: Self-pay | Admitting: *Deleted

## 2012-09-23 ENCOUNTER — Telehealth: Payer: Self-pay | Admitting: Internal Medicine

## 2012-09-23 NOTE — Telephone Encounter (Signed)
Dr. Ashley Royalty discussed results w/ pt today.

## 2012-09-24 ENCOUNTER — Encounter: Payer: Self-pay | Admitting: *Deleted

## 2012-09-24 NOTE — Telephone Encounter (Signed)
See above note

## 2012-09-25 ENCOUNTER — Encounter: Payer: Self-pay | Admitting: *Deleted

## 2012-09-25 ENCOUNTER — Telehealth: Payer: Self-pay | Admitting: *Deleted

## 2012-09-25 MED ORDER — OXYCODONE HCL 10 MG PO TABS: 10.0000 mg | ORAL_TABLET | Freq: Four times a day (QID) | ORAL | Status: DC | PRN

## 2012-09-25 MED ORDER — OXYCODONE HCL 5 MG PO TABS: 10.0000 mg | ORAL_TABLET | Freq: Four times a day (QID) | ORAL | Status: DC | PRN

## 2012-09-28 NOTE — Telephone Encounter (Signed)
Per Westley Hummer, pt picked up Rx on Friday May 16th, 2014.

## 2012-10-06 ENCOUNTER — Telehealth: Payer: Self-pay | Admitting: *Deleted

## 2012-10-09 MED ORDER — LORAZEPAM 1 MG PO TABS: 1.0000 mg | ORAL_TABLET | Freq: Four times a day (QID) | ORAL | Status: DC | PRN

## 2012-10-09 MED ORDER — OXYCODONE HCL 10 MG PO TABS: 10.0000 mg | ORAL_TABLET | Freq: Four times a day (QID) | ORAL | Status: DC | PRN

## 2012-10-09 NOTE — Telephone Encounter (Signed)
Pt called for refill on oxycodone 10mg  gave #[phone] 09/25/12 a refill given same dose and amt

## 2012-10-19 ENCOUNTER — Ambulatory Visit: Payer: Medicare Other | Admitting: Primary Care

## 2012-10-20 ENCOUNTER — Ambulatory Visit (INDEPENDENT_AMBULATORY_CARE_PROVIDER_SITE_OTHER): Payer: Medicare Other | Admitting: Primary Care

## 2012-10-20 ENCOUNTER — Encounter: Payer: Self-pay | Admitting: Primary Care

## 2012-10-20 VITALS — BP 144/96 | HR 79 | Temp 98.4°F | Ht 66.0 in | Wt 231.0 lb

## 2012-10-20 DIAGNOSIS — F411 Generalized anxiety disorder: Secondary | ICD-10-CM | POA: Diagnosis not present

## 2012-10-20 DIAGNOSIS — F329 Major depressive disorder, single episode, unspecified: Secondary | ICD-10-CM | POA: Diagnosis not present

## 2012-10-20 MED ORDER — OXYMORPHONE HCL 5 MG PO TABS: 5.0000 mg | ORAL_TABLET | Freq: Every day | ORAL | Status: DC

## 2012-10-20 MED ORDER — CITALOPRAM HYDROBROMIDE 20 MG PO TABS: 20.0000 mg | ORAL_TABLET | Freq: Every day | ORAL | Status: DC

## 2012-10-20 MED ORDER — OXYMORPHONE HCL 10 MG PO TABS: 10.0000 mg | ORAL_TABLET | Freq: Every morning | ORAL | Status: DC

## 2012-10-20 MED ORDER — LORAZEPAM 1 MG PO TABS: 1.0000 mg | ORAL_TABLET | Freq: Four times a day (QID) | ORAL | Status: DC | PRN

## 2012-10-20 MED ORDER — OXYCODONE HCL 10 MG PO TABS: 10.0000 mg | ORAL_TABLET | Freq: Four times a day (QID) | ORAL | Status: DC | PRN

## 2012-10-20 NOTE — Progress Notes (Signed)
SICKLE CELL SERVICE PROGRESS NOTE Cassie Montgomery, Cassie Montgomery, Cassie Montgomery DOB 2070/06/24  PCP: Marthann Schiller, MD 10/20/2012   Chief Complaint  Patient presents with  . Follow-up    concerned about elevated BP, states used to be on BP meds in past  . Anxiety  . Medication Refill    refill on all meds  . Sickle Cell Anemia    arm & leg pain 5/10, diarrhea    Subjective: Cassie Montgomery is in today with concern of elevated Bp every time she in for a visit it is elevated. She also mentioned increased stressors with her children. She does have bilateral knee pain provacative factors are walking and stairs palliative factors are rest. She also has c/o pain in her arms and legs. She rates her pain 5/10.   Review of Systems  Constitutional: Negative.   Eyes: Positive for blurred vision.  Respiratory: Negative.   Cardiovascular: Negative.   Gastrointestinal: Negative.   Genitourinary: Negative.   Musculoskeletal: Positive for joint pain.  Neurological: Positive for headaches.  Endo/Heme/Allergies: Negative.   Psychiatric/Behavioral: Positive for depression. The patient is nervous/anxious.    Allergies  Allergen Reactions  . Sulfa Antibiotics Other (See Comments)    Increased bilirubin     Outpatient Encounter Prescriptions as of 10/20/2012  Medication Sig Dispense Refill  . citalopram (CELEXA) 20 MG tablet Take 20 mg by mouth daily.      . folic acid (FOLVITE) 1 MG tablet Take 1 mg by mouth daily.      Marland Kitchen ibuprofen (ADVIL,MOTRIN) 800 MG tablet Take 800 mg by mouth every 8 (eight) hours as needed for pain.      Marland Kitchen levalbuterol (XOPENEX HFA) 45 MCG/ACT inhaler Inhale 2 puffs into the lungs 2 (two) times daily as needed. For shortness of breath      . LORazepam (ATIVAN) 1 MG tablet Take 1 tablet (1 mg total) by mouth every 6 (six) hours as needed for anxiety.  30 tablet  0  . Oxycodone HCl 10 MG TABS Take 1 tablet (10 mg total) by mouth every 6 (six) hours as needed.  60 each  0  . oxymorphone  (OPANA) 10 MG tablet Take 10 mg by mouth every morning.      Marland Kitchen oxymorphone (OPANA) 5 MG tablet Take 5 mg by mouth at bedtime.      . cholecalciferol (VITAMIN D) 1000 UNITS tablet Take 1,000 Units by mouth daily.      Marland Kitchen oxyCODONE-acetaminophen (PERCOCET/ROXICET) 5-325 MG per tablet Take 1-2 tablets by mouth every 4 (four) hours as needed. For pain       No facility-administered encounter medications on file as of 10/20/2012.   Physical Exam   Filed Vitals:   10/20/12 1440  BP: 144/96  Pulse:   Temp:     General: Alert, awake, oriented x3, in no acute distress.  HEENT: [place]/AT PEERL, EOMI, mild icteric sclarea  Neck: Trachea midline,  no masses, no thyromegal,y no JVD, no carotid bruit OROPHARYNX:  Moist, No exudate/ erythema/lesions.  Heart: Regular rate and rhythm, without murmurs, rubs, gallops Lungs: Clear to auscultation, no wheezing or rhonchi noted. No  vocal fremitus Abdomen: obese ,Soft, nontender, nondistended, positive bowel sounds Neuro: No focal neurological deficits noted cranial nerves II . Musculoskeletal: No warm swelling or erythema around joints, no spinal tenderness noted. Psychiatric: Patient alert and oriented x3, good insight and cognition, good recent to remote recall. Lymph node survey: No cervical axillary    Assessment/Plan:  Elevated Bp:  maybe  secondary to pain at this time. She rates her pain 5/10 legs and arms and very cold at this time which is a provacative factor. She will have 2 nurse visit for Bp check to determine if Bp needs pharmacological intervention. Treatment for today is diet monitor Na intake read labels, and exercise.  Sickle cell disease:  (HGB Hgb [place]) stable at this time. She is in mild pain 5/10 her baseline pain 2-3/10.  Next visit she will need blood work hemoglobin electrophoresis, retic count, and ferritin. Continue folic acid.  Acute on chronic pain: Patient has admitted to having increase pain in arms and legs over the last few weeks.  She did not feel she needed to go to the  ED.  Therefore she took 1 1/2 oxycodone to manage her increase painful episodes. We discussed in the future she will need to call the office before she increases her analgesics.  Depression/ Anxiety : stable she has no suicidal ideation. Continue Celexa. Increase stressors with son . Has anxiety attacks stable/controlled with Ativan  Obesity:  BMI 37.3 discussed diet and exercise and risk factors associated with obesity. Patient is knowledgeable and will start trying to exercise and monitor intake. Discussed nutrition consult. Will wait at this time.  Health maintenance   1. Eye exam pt to schedule    2. Lab work next appt    3. Spirometry    4. Echo     5. Pap    6. Mammogram

## 2012-10-21 NOTE — Progress Notes (Signed)
Late entry  Patient: Cassie Montgomery DOB :23-Jul-1970 MRN :[id number]  Date: 10/21/2012  Documentation Initiated by : Jefm Miles  Subjective/Objective Assessment: Cassie Montgomery is a 42 year old female with Hb [place] in for regular doctor's appointment with M.Edwards, NP. Cassie Montgomery stated she does not any questions or concerns at this time.   Barriers to Care: Need to update pcp on [place] access card  Prior Approval (PA) #: NA PA start date:NA PA end date:NA    Action/Plan: This CM will fax pcp change form. This CM explained her role in the [facility] and Cassie Montgomery verbalized understanding.   Time spent: 20 minutes  Karoline Caldwell, RN, BSN, [place]   [phone]

## 2012-10-30 ENCOUNTER — Telehealth: Payer: Self-pay | Admitting: Internal Medicine

## 2012-11-03 ENCOUNTER — Telehealth: Payer: Self-pay | Admitting: Internal Medicine

## 2012-11-03 MED ORDER — OXYCODONE HCL 10 MG PO TABS: 10.0000 mg | ORAL_TABLET | Freq: Four times a day (QID) | ORAL | Status: DC | PRN

## 2012-11-03 MED ORDER — OXYMORPHONE HCL 5 MG PO TABS: 5.0000 mg | ORAL_TABLET | Freq: Every day | ORAL | Status: DC

## 2012-11-03 MED ORDER — OXYMORPHONE HCL 10 MG PO TABS: 10.0000 mg | ORAL_TABLET | Freq: Every morning | ORAL | Status: DC

## 2012-11-05 ENCOUNTER — Telehealth: Payer: Self-pay | Admitting: Internal Medicine

## 2012-11-05 MED ORDER — LORAZEPAM 1 MG PO TABS: 1.0000 mg | ORAL_TABLET | Freq: Four times a day (QID) | ORAL | Status: DC | PRN
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
## 2012-11-09 ENCOUNTER — Other Ambulatory Visit: Payer: Self-pay | Admitting: Geriatric Medicine

## 2012-11-09 ENCOUNTER — Other Ambulatory Visit: Payer: Self-pay | Admitting: Hematology

## 2012-11-09 DIAGNOSIS — F411 Generalized anxiety disorder: Secondary | ICD-10-CM

## 2012-11-09 MED ORDER — LORAZEPAM 1 MG PO TABS: 1.0000 mg | ORAL_TABLET | Freq: Four times a day (QID) | ORAL | Status: DC | PRN

## 2012-11-16 ENCOUNTER — Telehealth: Payer: Self-pay | Admitting: Internal Medicine

## 2012-11-17 MED ORDER — OXYMORPHONE HCL 10 MG PO TABS: 10.0000 mg | ORAL_TABLET | Freq: Every morning | ORAL | Status: DC

## 2012-11-17 MED ORDER — OXYMORPHONE HCL 5 MG PO TABS: 5.0000 mg | ORAL_TABLET | Freq: Every day | ORAL | Status: DC

## 2012-11-17 NOTE — Telephone Encounter (Signed)
Cassie Montgomery requesting refill on Opana 5mg  # 30  and 10mg  #30  last filled 10/20/12 this will be ready for pick up tomorrow.

## 2012-11-20 ENCOUNTER — Ambulatory Visit (INDEPENDENT_AMBULATORY_CARE_PROVIDER_SITE_OTHER): Payer: Medicare Other | Admitting: Primary Care

## 2012-11-20 ENCOUNTER — Encounter: Payer: Self-pay | Admitting: Primary Care

## 2012-11-20 ENCOUNTER — Telehealth: Payer: Self-pay | Admitting: *Deleted

## 2012-11-20 VITALS — BP 148/88 | HR 90 | Temp 97.9°F | Resp 16 | Ht 66.0 in | Wt 234.0 lb

## 2012-11-20 DIAGNOSIS — D57819 Other sickle-cell disorders with crisis, unspecified: Secondary | ICD-10-CM | POA: Diagnosis not present

## 2012-11-20 MED ORDER — OXYCODONE HCL 10 MG PO TABS: 10.0000 mg | ORAL_TABLET | Freq: Four times a day (QID) | ORAL | Status: DC | PRN

## 2012-11-20 MED ORDER — IBUPROFEN 800 MG PO TABS: 800.0000 mg | ORAL_TABLET | Freq: Three times a day (TID) | ORAL | Status: DC | PRN

## 2012-11-20 NOTE — Progress Notes (Signed)
Patient ID: Cassie Montgomery, female   DOB: 1970-07-11, 42 y.o.   MRN: [id number] SICKLE CELL SERVICE PROGRESS NOTE  PCP: Marthann Schiller, MD 11/20/2012   Chief Complaint  Patient presents with  . Follow-up    Subjective: Cassie Montgomery is a 42 old [place] genotype SCD in today due to increased tiredness and weakness secondary to heavy menstrual flow and cramps. Also, due to increased situational stressors at home she has had more frequent painful crisis and states her current dosage of oxycodone is not helping as much as before.  Due to home situation she is fearful she will need to go to ED for pain management. She rates pain in legs 5/10 and her base line is 2/10 Currently having abdominal cramps and bilateral leg pain.   Review of Systems  Constitutional: Negative.   HENT:       Headaches have increased to every 4 days secondary stress. These headaches are not new in nature   Respiratory: Negative.   Cardiovascular: Negative.   Gastrointestinal: Negative.   Genitourinary: Negative.   Musculoskeletal:       Bilateral leg pain  Skin: Negative.   Neurological: Positive for headaches.  Endo/Heme/Allergies: Negative.   Psychiatric/Behavioral: Negative.    Allergies  Allergen Reactions  . Sulfa Antibiotics Other (See Comments)    Increased bilirubin     Outpatient Encounter Prescriptions as of 11/20/2012  Medication Sig Dispense Refill  . citalopram (CELEXA) 20 MG tablet Take 1 tablet (20 mg total) by mouth daily.  30 tablet  3  . folic acid (FOLVITE) 1 MG tablet Take 1 mg by mouth daily.      Marland Kitchen levalbuterol (XOPENEX HFA) 45 MCG/ACT inhaler Inhale 2 puffs into the lungs 2 (two) times daily as needed. For shortness of breath      . LORazepam (ATIVAN) 1 MG tablet Take 1 tablet (1 mg total) by mouth every 6 (six) hours as needed for anxiety.  120 tablet  0  . Oxycodone HCl 10 MG TABS Take 1 tablet (10 mg total) by mouth every 6 (six) hours as needed.  60 each  0  . oxymorphone (OPANA) 10  MG tablet Take 1 tablet (10 mg total) by mouth every morning.  30 tablet  0  . cholecalciferol (VITAMIN D) 1000 UNITS tablet Take 1,000 Units by mouth daily.      Marland Kitchen ibuprofen (ADVIL,MOTRIN) 800 MG tablet Take 800 mg by mouth every 8 (eight) hours as needed for pain.      Marland Kitchen oxymorphone (OPANA) 5 MG tablet Take 1 tablet (5 mg total) by mouth at bedtime.  30 tablet  0   No facility-administered encounter medications on file as of 11/20/2012.         Physical Exam   Filed Vitals:   11/20/12 1318  BP: 148/88  Pulse: 90  Temp: 97.9 F (36.6 C)  Resp: 16    General: Alert, awake, oriented x3, in mild distress.  HEENT: [place]/AT PEERL, EOMI Neck: Trachea midline,  no masses, no thyromegal,y no JVD, no carotid bruit OROPHARYNX:  Moist, No exudate/ erythema/lesions.  Heart: Regular rate and rhythm, without murmurs, rubs, gallops, PMI non-displaced, no heaves or thrills on palpation.  Lungs: Clear to auscultation, no wheezing or rhonchi noted. No increased vocal fremitus resonant to percussion  Abdomen: Obese ,Soft, nontender, nondistended, positive bowel sounds Neuro: No focal neurological deficits noted cranial nerves II through XII grossly intact.  Musculoskeletal: No warm swelling or erythema around joints, no spinal tenderness noted.  Psychiatric: Patient alert and oriented x3, good insight and cognition, good recent to remote recall. Lymph node survey: No cervical axillary lymphadenopathy noted.     Assessment/Plan: 1. Abdominal cramps: secondary to menstruation. Will re order ibuprofen 800mg  TID prn .  2. Sickle cell disease Hgb [place]  with increased pain crisis secondary to stressors and medication less effective. Increase Oxycodone 10 mg to 1 1/2 prn Q 6hrs for increase painful events. Monitor    Health maintenance: Pt will schedule an optometrists  appt  Before next visit  Echo to be scheduled  Labs to be determine next week due to pt schedule

## 2012-11-23 NOTE — Telephone Encounter (Signed)
I left a message for Cassie Montgomery to call the office.  I need to tell her about her upcoming appointment for her 2D Echocardiogram that is scheduled for November 25, 2012 @ 10:00am here at [facility].  She needs to arrive at 9:30 am at amitting.

## 2012-11-25 ENCOUNTER — Ambulatory Visit (HOSPITAL_COMMUNITY): Payer: Medicare Other | Attending: Internal Medicine

## 2012-11-26 ENCOUNTER — Telehealth: Payer: Self-pay | Admitting: Internal Medicine

## 2012-11-26 DIAGNOSIS — D57819 Other sickle-cell disorders with crisis, unspecified: Secondary | ICD-10-CM

## 2012-11-27 MED ORDER — OXYCODONE HCL 10 MG PO TABS: 10.0000 mg | ORAL_TABLET | Freq: Four times a day (QID) | ORAL | Status: DC | PRN

## 2012-11-27 NOTE — Telephone Encounter (Signed)
Refill for Oxycodone 52m 1 1/2 Q 6hrs prn # 90 available for pick up next week

## 2012-12-01 ENCOUNTER — Telehealth (HOSPITAL_COMMUNITY): Payer: Self-pay | Admitting: Hematology

## 2012-12-01 ENCOUNTER — Telehealth: Payer: Self-pay | Admitting: Internal Medicine

## 2012-12-01 NOTE — Telephone Encounter (Signed)
Patient called in asking to speak with Gwinda Passe, NP.  I explained that Marcelino Duster was seeing a patient right now.  Offered my assistance.  Patient declined stating she was returning Michelle's call, and would like for Marcelino Duster to call her back.  I advised I would notify michelle.

## 2012-12-03 NOTE — Telephone Encounter (Signed)
Refilled ready for pick up on oxycodone 10mg  1 1/2 Q 6hrs prn  This appears to be early because full qty not avalible and she was given only [facility]. Ativan refilled also.

## 2012-12-14 ENCOUNTER — Telehealth: Payer: Self-pay | Admitting: Internal Medicine

## 2012-12-14 DIAGNOSIS — D57819 Other sickle-cell disorders with crisis, unspecified: Secondary | ICD-10-CM

## 2012-12-14 DIAGNOSIS — F411 Generalized anxiety disorder: Secondary | ICD-10-CM

## 2012-12-15 ENCOUNTER — Other Ambulatory Visit: Payer: Self-pay | Admitting: Geriatric Medicine

## 2012-12-15 MED ORDER — OXYCODONE HCL 10 MG PO TABS: 10.0000 mg | ORAL_TABLET | Freq: Four times a day (QID) | ORAL | Status: DC | PRN

## 2012-12-15 MED ORDER — OXYMORPHONE HCL 5 MG PO TABS: 5.0000 mg | ORAL_TABLET | Freq: Every day | ORAL | Status: DC

## 2012-12-15 MED ORDER — OXYMORPHONE HCL 10 MG PO TABS: 10.0000 mg | ORAL_TABLET | Freq: Every morning | ORAL | Status: DC

## 2012-12-15 NOTE — Telephone Encounter (Signed)
All prescriptions can be picked up on the 12/18/12 Original date of  oxycodone was written on the 11/24/12 however was not filled until 12/03/12 this was a 15 day supply  Oxymorphone 10mg  Qam and 5mg  Q pm. Appt schedule for Feb 15, 2013 with Dr. Ashley Royalty. If continued to have increase  pain she will need to be seen sooner.

## 2012-12-16 ENCOUNTER — Other Ambulatory Visit: Payer: Self-pay | Admitting: Geriatric Medicine

## 2012-12-16 ENCOUNTER — Other Ambulatory Visit: Payer: Self-pay | Admitting: Primary Care

## 2012-12-16 DIAGNOSIS — F411 Generalized anxiety disorder: Secondary | ICD-10-CM

## 2012-12-16 MED ORDER — LORAZEPAM 1 MG PO TABS: 1.0000 mg | ORAL_TABLET | Freq: Four times a day (QID) | ORAL | Status: DC | PRN

## 2012-12-16 NOTE — Telephone Encounter (Signed)
Ativan 1mg  Q 6hrs prn  # 120 anxiety/sleep last filled 11/09/12

## 2012-12-23 ENCOUNTER — Emergency Department (HOSPITAL_COMMUNITY): Payer: Medicare Other

## 2012-12-23 ENCOUNTER — Telehealth: Payer: Self-pay | Admitting: Internal Medicine

## 2012-12-23 ENCOUNTER — Inpatient Hospital Stay (HOSPITAL_COMMUNITY)
Admission: EM | Admit: 2012-12-23 | Discharge: 2012-12-29 | DRG: 812 | Disposition: A | Discharge status: Home / Self Care | Payer: Medicare Other | Attending: Internal Medicine | Admitting: Internal Medicine

## 2012-12-23 ENCOUNTER — Encounter (HOSPITAL_COMMUNITY): Payer: Self-pay | Admitting: Emergency Medicine

## 2012-12-23 DIAGNOSIS — D649 Anemia, unspecified: Secondary | ICD-10-CM

## 2012-12-23 DIAGNOSIS — T50905A Adverse effect of unspecified drugs, medicaments and biological substances, initial encounter: Secondary | ICD-10-CM

## 2012-12-23 DIAGNOSIS — D5700 Hb-SS disease with crisis, unspecified: Secondary | ICD-10-CM | POA: Diagnosis not present

## 2012-12-23 DIAGNOSIS — E876 Hypokalemia: Secondary | ICD-10-CM | POA: Diagnosis present

## 2012-12-23 DIAGNOSIS — F411 Generalized anxiety disorder: Secondary | ICD-10-CM | POA: Diagnosis not present

## 2012-12-23 DIAGNOSIS — R0609 Other forms of dyspnea: Secondary | ICD-10-CM

## 2012-12-23 DIAGNOSIS — J209 Acute bronchitis, unspecified: Secondary | ICD-10-CM | POA: Diagnosis not present

## 2012-12-23 DIAGNOSIS — Z79899 Other long term (current) drug therapy: Secondary | ICD-10-CM | POA: Diagnosis not present

## 2012-12-23 DIAGNOSIS — F329 Major depressive disorder, single episode, unspecified: Secondary | ICD-10-CM | POA: Diagnosis not present

## 2012-12-23 DIAGNOSIS — R404 Transient alteration of awareness: Secondary | ICD-10-CM | POA: Diagnosis not present

## 2012-12-23 DIAGNOSIS — R0989 Other specified symptoms and signs involving the circulatory and respiratory systems: Secondary | ICD-10-CM | POA: Diagnosis not present

## 2012-12-23 DIAGNOSIS — R410 Disorientation, unspecified: Secondary | ICD-10-CM | POA: Diagnosis not present

## 2012-12-23 DIAGNOSIS — F3289 Other specified depressive episodes: Secondary | ICD-10-CM

## 2012-12-23 DIAGNOSIS — D571 Sickle-cell disease without crisis: Secondary | ICD-10-CM

## 2012-12-23 DIAGNOSIS — J45909 Unspecified asthma, uncomplicated: Secondary | ICD-10-CM | POA: Diagnosis not present

## 2012-12-23 LAB — CBC WITH DIFFERENTIAL/PLATELET
Basophils Absolute: 0.1 10*3/uL (ref 0.0–0.1)
Eosinophils Absolute: 0.1 10*3/uL (ref 0.0–0.7)
Lymphs Abs: 0.7 10*3/uL (ref 0.7–4.0)
MCH: 25.1 pg — ABNORMAL LOW (ref 26.0–34.0)
MCHC: 33.9 g/dL (ref 30.0–36.0)
MCV: 74 fL — ABNORMAL LOW (ref 78.0–100.0)
Monocytes Absolute: 0.5 10*3/uL (ref 0.1–1.0)
Monocytes Relative: 7 % (ref 3–12)
Neutro Abs: 5.1 10*3/uL (ref 1.7–7.7)
Platelets: 105 10*3/uL — ABNORMAL LOW (ref 150–400)
RDW: 17.3 % — ABNORMAL HIGH (ref 11.5–15.5)
WBC: 6.5 10*3/uL (ref 4.0–10.5)

## 2012-12-23 LAB — POCT PREGNANCY, URINE: Preg Test, Ur: NEGATIVE

## 2012-12-23 LAB — COMPREHENSIVE METABOLIC PANEL
Albumin: 3.6 g/dL (ref 3.5–5.2)
BUN: 8 mg/dL (ref 6–23)
Creatinine, Ser: 0.65 mg/dL (ref 0.50–1.10)
GFR calc Af Amer: >90 mL/min (ref >90)
Glucose, Bld: 119 mg/dL — ABNORMAL HIGH (ref 70–99)
Total Protein: 6.9 g/dL (ref 6.0–8.3)

## 2012-12-23 LAB — URINALYSIS, ROUTINE W REFLEX MICROSCOPIC
Glucose, UA: NEGATIVE mg/dL
Ketones, ur: NEGATIVE mg/dL
Leukocytes, UA: NEGATIVE
Nitrite: NEGATIVE
Protein, ur: NEGATIVE mg/dL
pH: 6 (ref 5.0–8.0)

## 2012-12-23 LAB — RETICULOCYTES
Retic Count, Absolute: 161 10*3/uL (ref 19.0–186.0)
Retic Ct Pct: 4.4 % — ABNORMAL HIGH (ref 0.4–3.1)

## 2012-12-23 MED ORDER — SODIUM CHLORIDE 0.9 % IV SOLN
Freq: Once | INTRAVENOUS | Status: AC
  Administered 2012-12-23: 21:00:00 via INTRAVENOUS

## 2012-12-23 MED ORDER — HYDROMORPHONE HCL PF 2 MG/ML IJ SOLN
2.0000 mg | INTRAMUSCULAR | Status: DC | PRN
  Administered 2012-12-23 – 2012-12-24 (×2): 2 mg via INTRAVENOUS
  Filled 2012-12-23: qty 1
  Filled 2012-12-23: qty 2
  Filled 2012-12-23 (×2): qty 1

## 2012-12-23 MED ORDER — HYDROMORPHONE HCL PF 2 MG/ML IJ SOLN: 2.0000 mg | Freq: Once | INTRAMUSCULAR | Status: DC

## 2012-12-23 MED ORDER — ONDANSETRON HCL 4 MG/2ML IJ SOLN
4.0000 mg | Freq: Once | INTRAMUSCULAR | Status: AC
  Administered 2012-12-23: 4 mg via INTRAVENOUS
  Filled 2012-12-23: qty 2

## 2012-12-23 MED ORDER — DIPHENHYDRAMINE HCL 50 MG/ML IJ SOLN
25.0000 mg | Freq: Once | INTRAMUSCULAR | Status: AC
  Administered 2012-12-23: 25 mg via INTRAVENOUS
  Filled 2012-12-23: qty 1

## 2012-12-23 MED ORDER — HYDROMORPHONE HCL PF 2 MG/ML IJ SOLN
2.0000 mg | Freq: Once | INTRAMUSCULAR | Status: AC
  Administered 2012-12-23: 2 mg via INTRAVENOUS

## 2012-12-23 NOTE — ED Notes (Signed)
C/o pain generalized pain since last noc.  States usual regiment did not relieve pain.  Patient appears to be in distress.

## 2012-12-23 NOTE — ED Provider Notes (Signed)
CSN: [id number]     Arrival date & time 12/23/12  1858 History     First MD Initiated Contact with Patient 12/23/12 2001     Chief Complaint  Patient presents with  . Sickle Cell Pain Crisis   (Consider location/radiation/quality/duration/timing/severity/associated sxs/prior Treatment) HPI Comments: Patient presents with h/o Hgb  AFB disease -- presents with c/o lower and upper extremity, back, chest pain, mild SOB that began yesterday and worsened today. Patient took her usual oxycodone and Opana without relief. She denies fever, cold symptoms, cough, urinary symptoms, rash. She has been under a lot of stress. Onset of symptoms gradual. Course is constant. Nothing makes symptoms better or worse.  Patient is a 42 y.o. female presenting with sickle cell pain. The history is provided by the patient and medical records.  Sickle Cell Pain Crisis Associated symptoms: chest pain and shortness of breath   Associated symptoms: no cough, no fever, no headaches, no nausea, no sore throat and no vomiting     Past Medical History  Diagnosis Date  . PMDD (premenstrual dysphoric disorder)   . Sickle cell anemia   . Sickle cell disease without crisis 07/15/2011  . Migraine with aura 07/15/2011   Past Surgical History  Procedure Laterality Date  . Cesarean section     Family History  Problem Relation Age of Onset  . Hypertension Mother    History  Substance Use Topics  . Smoking status: Never Smoker   . Smokeless tobacco: Not on file  . Alcohol Use: No   OB History   Grav Para Term Preterm Abortions TAB SAB Ect Mult Living                 Review of Systems  Constitutional: Negative for fever.  HENT: Negative for sore throat and rhinorrhea.   Eyes: Negative for redness.  Respiratory: Positive for shortness of breath. Negative for cough.   Cardiovascular: Positive for chest pain.  Gastrointestinal: Negative for nausea, vomiting, abdominal pain and diarrhea.  Genitourinary: Negative for  dysuria.  Musculoskeletal: Positive for back pain and arthralgias. Negative for myalgias.  Skin: Negative for rash.  Neurological: Negative for headaches.    Allergies  Sulfa antibiotics  Home Medications   Current Outpatient Rx  Name  Route  Sig  Dispense  Refill  . citalopram (CELEXA) 20 MG tablet   Oral   Take 1 tablet (20 mg total) by mouth daily.   30 tablet   3   . folic acid (FOLVITE) 1 MG tablet   Oral   Take 1 mg by mouth daily.         Marland Kitchen ibuprofen (ADVIL,MOTRIN) 800 MG tablet   Oral   Take 1 tablet (800 mg total) by mouth every 8 (eight) hours as needed for pain.   90 tablet   1   . levalbuterol (XOPENEX HFA) 45 MCG/ACT inhaler   Inhalation   Inhale 2 puffs into the lungs 2 (two) times daily as needed for wheezing or shortness of breath.          Marland Kitchen LORazepam (ATIVAN) 1 MG tablet   Oral   Take 1 tablet (1 mg total) by mouth every 6 (six) hours as needed for anxiety.   120 tablet   0   . Oxycodone HCl 10 MG TABS   Oral   Take 10-15 mg by mouth every 6 (six) hours as needed (for pain).         Marland Kitchen oxymorphone (OPANA) 10 MG tablet  Oral   Take 1 tablet (10 mg total) by mouth every morning.   30 tablet   0   . oxymorphone (OPANA) 5 MG tablet   Oral   Take 1 tablet (5 mg total) by mouth at bedtime.   30 tablet   0    BP 137/88  Pulse 102  Temp(Src) 99 F (37.2 C) (Oral)  SpO2 98%  LMP 11/24/2012 Physical Exam  Nursing note and vitals reviewed. Constitutional: She appears well-developed and well-nourished.  HENT:  Head: Normocephalic and atraumatic.  Eyes: Conjunctivae are normal. Pupils are equal, round, and reactive to light. Right eye exhibits no discharge. Left eye exhibits no discharge.  No icterus  Neck: Normal range of motion. Neck supple.  Cardiovascular: Normal rate, regular rhythm and normal heart sounds.   Pulmonary/Chest: Effort normal. No respiratory distress. She has no wheezes. She has no rales.  Scattered rhonchi   Abdominal: Soft. There is no tenderness.  Musculoskeletal: She exhibits no tenderness.  Neurological: She is alert.  Skin: Skin is warm and dry.  Psychiatric: She has a normal mood and affect.    ED Course   Procedures (including critical care time)  Labs Reviewed  CBC WITH DIFFERENTIAL - Abnormal; Notable for the following:    RBC 3.66 (*)    Hemoglobin 9.2 (*)    HCT 27.1 (*)    MCV 74.0 (*)    MCH 25.1 (*)    RDW 17.3 (*)    Platelets 105 (*)    Neutrophils Relative % 79 (*)    Lymphocytes Relative 11 (*)    All other components within normal limits  COMPREHENSIVE METABOLIC PANEL - Abnormal; Notable for the following:    Sodium 134 (*)    Glucose, Bld 119 (*)    Total Bilirubin 2.8 (*)    All other components within normal limits  URINALYSIS, ROUTINE W REFLEX MICROSCOPIC - Abnormal; Notable for the following:    Urobilinogen, UA 2.0 (*)    All other components within normal limits  RETICULOCYTES - Abnormal; Notable for the following:    Retic Ct Pct 4.4 (*)    RBC. 3.66 (*)    All other components within normal limits  POCT PREGNANCY, URINE   Dg Chest 2 View  12/23/2012   *RADIOLOGY REPORT*  Clinical Data: Chest pain and shortness of breath  CHEST - 2 VIEW  Comparison: 12/06/2011  Findings: Lung volumes are low with crowding of the bronchovascular markings.  Heart size is normal.  Mild central vascular congestion without overt edema.  No pleural effusion.  No acute osseous finding.  IMPRESSION: Mild central vascular congestion, unchanged, without new focal acute finding.   Original Report Authenticated By: Christiana Pellant, M.D.   1. Sickle cell crisis     9:20 PM Patient seen and examined. Work-up initiated. Medications ordered.   Vital signs reviewed and are as follows: Filed Vitals:   12/23/12 1914  BP: 137/88  Pulse: 102  Temp: 99 F (37.2 C)   10:57 PM Dilaudid x 2. Patient continues to have pain.   12:52 AM Patient not improved. She agrees to admission.  [facility] paged.   12:54 AM Dr. Susie Cassette to see.    MDM  Sickle cell flare, Hgb [place], admit for pain control.   Renne Crigler, PA-C 12/24/12 [phone]

## 2012-12-23 NOTE — ED Provider Notes (Signed)
Medical screening examination/treatment/procedure(s) were performed by non-physician practitioner and as supervising physician I was immediately available for consultation/collaboration.  Tyson Masin N Olga Bourbeau, DO 12/23/12 2326 

## 2012-12-24 ENCOUNTER — Encounter (HOSPITAL_COMMUNITY): Payer: Self-pay | Admitting: Urology

## 2012-12-24 LAB — COMPREHENSIVE METABOLIC PANEL
ALT: 8 U/L (ref 0–35)
Albumin: 3.1 g/dL — ABNORMAL LOW (ref 3.5–5.2)
Alkaline Phosphatase: 53 U/L (ref 39–117)
Chloride: 105 mEq/L (ref 96–112)
Glucose, Bld: 104 mg/dL — ABNORMAL HIGH (ref 70–99)
Potassium: 3.1 mEq/L — ABNORMAL LOW (ref 3.5–5.1)
Sodium: 136 mEq/L (ref 135–145)
Total Bilirubin: 2.2 mg/dL — ABNORMAL HIGH (ref 0.3–1.2)
Total Protein: 6 g/dL (ref 6.0–8.3)

## 2012-12-24 LAB — CBC WITH DIFFERENTIAL/PLATELET
Basophils Relative: 0 % (ref 0–1)
Eosinophils Absolute: 0.1 10*3/uL (ref 0.0–0.7)
Eosinophils Relative: 3 % (ref 0–5)
HCT: 26.3 % — ABNORMAL LOW (ref 36.0–46.0)
Hemoglobin: 9 g/dL — ABNORMAL LOW (ref 12.0–15.0)
MCH: 25.2 pg — ABNORMAL LOW (ref 26.0–34.0)
MCHC: 34.2 g/dL (ref 30.0–36.0)
MCV: 73.7 fL — ABNORMAL LOW (ref 78.0–100.0)
Monocytes Absolute: 0.4 10*3/uL (ref 0.1–1.0)
Monocytes Relative: 7 % (ref 3–12)
RDW: 17.3 % — ABNORMAL HIGH (ref 11.5–15.5)

## 2012-12-24 LAB — URINALYSIS, ROUTINE W REFLEX MICROSCOPIC
Bilirubin Urine: NEGATIVE
Glucose, UA: NEGATIVE mg/dL
Hgb urine dipstick: NEGATIVE
Nitrite: NEGATIVE
Specific Gravity, Urine: 1.012 (ref 1.005–1.030)
pH: 6 (ref 5.0–8.0)

## 2012-12-24 LAB — RETICULOCYTES: Retic Count, Absolute: 164.2 10*3/uL (ref 19.0–186.0)

## 2012-12-24 MED ORDER — ENOXAPARIN SODIUM 40 MG/0.4ML ~~LOC~~ SOLN
40.0000 mg | SUBCUTANEOUS | Status: DC
  Administered 2012-12-24 – 2012-12-28 (×5): 40 mg via SUBCUTANEOUS
  Filled 2012-12-24 (×7): qty 0.4

## 2012-12-24 MED ORDER — ONDANSETRON HCL 4 MG/2ML IJ SOLN
4.0000 mg | Freq: Three times a day (TID) | INTRAMUSCULAR | Status: AC | PRN
  Administered 2012-12-24: 4 mg via INTRAVENOUS
  Filled 2012-12-24: qty 2

## 2012-12-24 MED ORDER — ENOXAPARIN SODIUM 40 MG/0.4ML ~~LOC~~ SOLN
40.0000 mg | SUBCUTANEOUS | Status: DC
  Filled 2012-12-24: qty 0.4

## 2012-12-24 MED ORDER — HYDROMORPHONE HCL PF 1 MG/ML IJ SOLN
1.0000 mg | INTRAMUSCULAR | Status: DC | PRN
  Administered 2012-12-24 (×2): 1 mg via INTRAVENOUS
  Filled 2012-12-24 (×2): qty 1

## 2012-12-24 MED ORDER — PROMETHAZINE HCL 25 MG PO TABS
12.5000 mg | ORAL_TABLET | ORAL | Status: DC | PRN
  Administered 2012-12-25 – 2012-12-26 (×3): 25 mg via ORAL
  Administered 2012-12-27: 12.5 mg via ORAL
  Administered 2012-12-28 – 2012-12-29 (×2): 25 mg via ORAL
  Filled 2012-12-24 (×6): qty 1

## 2012-12-24 MED ORDER — HYDROMORPHONE HCL PF 2 MG/ML IJ SOLN
2.0000 mg | INTRAMUSCULAR | Status: AC | PRN
  Administered 2012-12-24: 2 mg via INTRAVENOUS
  Filled 2012-12-24: qty 1

## 2012-12-24 MED ORDER — FOLIC ACID 1 MG PO TABS
1.0000 mg | ORAL_TABLET | Freq: Every day | ORAL | Status: DC
  Filled 2012-12-24: qty 1

## 2012-12-24 MED ORDER — OXYCODONE HCL 5 MG PO TABS
15.0000 mg | ORAL_TABLET | ORAL | Status: DC
  Administered 2012-12-24 – 2012-12-29 (×24): 15 mg via ORAL
  Filled 2012-12-24 (×26): qty 3

## 2012-12-24 MED ORDER — DIPHENHYDRAMINE HCL 50 MG/ML IJ SOLN
12.5000 mg | Freq: Once | INTRAMUSCULAR | Status: AC
  Administered 2012-12-24: 12.5 mg via INTRAVENOUS
  Filled 2012-12-24: qty 1

## 2012-12-24 MED ORDER — IBUPROFEN 800 MG PO TABS: 800.0000 mg | ORAL_TABLET | Freq: Three times a day (TID) | ORAL | Status: DC | PRN

## 2012-12-24 MED ORDER — CITALOPRAM HYDROBROMIDE 20 MG PO TABS
20.0000 mg | ORAL_TABLET | Freq: Every day | ORAL | Status: DC
  Administered 2012-12-24 – 2012-12-29 (×6): 20 mg via ORAL
  Filled 2012-12-24 (×6): qty 1

## 2012-12-24 MED ORDER — DIPHENHYDRAMINE HCL 50 MG/ML IJ SOLN
12.5000 mg | INTRAMUSCULAR | Status: DC | PRN
  Administered 2012-12-24 – 2012-12-26 (×4): 25 mg via INTRAVENOUS
  Administered 2012-12-26 – 2012-12-28 (×2): 12.5 mg via INTRAVENOUS
  Administered 2012-12-28: 25 mg via INTRAVENOUS
  Administered 2012-12-29: 12.5 mg via INTRAVENOUS
  Filled 2012-12-24 (×10): qty 1

## 2012-12-24 MED ORDER — AZITHROMYCIN 500 MG PO TABS
500.0000 mg | ORAL_TABLET | Freq: Every day | ORAL | Status: DC
  Administered 2012-12-24 – 2012-12-29 (×6): 500 mg via ORAL
  Filled 2012-12-24 (×6): qty 1

## 2012-12-24 MED ORDER — DOCUSATE SODIUM 100 MG PO CAPS
100.0000 mg | ORAL_CAPSULE | Freq: Two times a day (BID) | ORAL | Status: DC
  Administered 2012-12-24 – 2012-12-29 (×11): 100 mg via ORAL
  Filled 2012-12-24 (×12): qty 1

## 2012-12-24 MED ORDER — SODIUM CHLORIDE 0.9 % IV SOLN: INTRAVENOUS | Status: DC

## 2012-12-24 MED ORDER — HYDROMORPHONE HCL PF 2 MG/ML IJ SOLN
3.0000 mg | Freq: Once | INTRAMUSCULAR | Status: AC
  Administered 2012-12-24: 3 mg via INTRAVENOUS

## 2012-12-24 MED ORDER — POTASSIUM CHLORIDE CRYS ER 20 MEQ PO TBCR
40.0000 meq | EXTENDED_RELEASE_TABLET | ORAL | Status: AC
  Administered 2012-12-24 (×2): 40 meq via ORAL
  Filled 2012-12-24 (×2): qty 2

## 2012-12-24 MED ORDER — DIPHENHYDRAMINE HCL 25 MG PO CAPS
25.0000 mg | ORAL_CAPSULE | ORAL | Status: DC | PRN
  Administered 2012-12-24: 25 mg via ORAL
  Filled 2012-12-24 (×3): qty 1

## 2012-12-24 MED ORDER — DEXTROSE-NACL 5-0.45 % IV SOLN
INTRAVENOUS | Status: DC
  Administered 2012-12-24 – 2012-12-29 (×11): via INTRAVENOUS

## 2012-12-24 MED ORDER — LORAZEPAM 1 MG PO TABS
1.0000 mg | ORAL_TABLET | Freq: Four times a day (QID) | ORAL | Status: DC | PRN
  Administered 2012-12-24 – 2012-12-28 (×4): 1 mg via ORAL
  Filled 2012-12-24 (×4): qty 1

## 2012-12-24 MED ORDER — FOLIC ACID 1 MG PO TABS
1.0000 mg | ORAL_TABLET | Freq: Every day | ORAL | Status: DC
  Administered 2012-12-24 – 2012-12-29 (×6): 1 mg via ORAL
  Filled 2012-12-24 (×6): qty 1

## 2012-12-24 MED ORDER — KETOROLAC TROMETHAMINE 15 MG/ML IJ SOLN
15.0000 mg | Freq: Four times a day (QID) | INTRAMUSCULAR | Status: AC
  Administered 2012-12-24 – 2012-12-29 (×20): 15 mg via INTRAVENOUS
  Filled 2012-12-24 (×24): qty 1

## 2012-12-24 MED ORDER — OXYCODONE HCL 5 MG PO TABS: 10.0000 mg | ORAL_TABLET | Freq: Four times a day (QID) | ORAL | Status: DC | PRN

## 2012-12-24 MED ORDER — PROMETHAZINE HCL 12.5 MG RE SUPP
12.5000 mg | RECTAL | Status: DC | PRN
  Filled 2012-12-24: qty 2

## 2012-12-24 MED ORDER — SODIUM CHLORIDE 0.9 % IV SOLN
Freq: Once | INTRAVENOUS | Status: AC
  Administered 2012-12-24: via INTRAVENOUS

## 2012-12-24 MED ORDER — SENNA 8.6 MG PO TABS
1.0000 | ORAL_TABLET | Freq: Two times a day (BID) | ORAL | Status: DC
  Administered 2012-12-24 – 2012-12-28 (×10): 8.6 mg via ORAL
  Filled 2012-12-24 (×10): qty 1

## 2012-12-24 MED ORDER — LEVALBUTEROL TARTRATE 45 MCG/ACT IN AERO
2.0000 | INHALATION_SPRAY | Freq: Two times a day (BID) | RESPIRATORY_TRACT | Status: DC | PRN
  Administered 2012-12-24 – 2012-12-27 (×6): 2 via RESPIRATORY_TRACT
  Filled 2012-12-24: qty 15

## 2012-12-24 NOTE — ED Notes (Signed)
Received a call from [name], MD--- order received to keep pt in ED room until she sees pt.

## 2012-12-24 NOTE — H&P (Addendum)
[facility] History and Physical  Cassie Montgomery WUJ:[id number] DOB: 10-09-1970 DOA: 12/23/2012  Referring physician: * PCP: MATTHEWS,MICHELLE A., MD   Chief Complaint: Sickle cell crisis HPI:  42 year old female with a history of Hgb [place] disease -- presents with c/o lower and upper extremity, back, chest pain, mild SOB that began yesterday and worsened today. Patient took her usual oxycodone and Opana without relief. She denies fever, cold symptoms, cough, urinary symptoms, rash. She has been under a lot of stress because of her children. Onset of symptoms gradual. Course is constant. Nothing makes symptoms better or worse.  She has been feeling increasingly tired and weak secondary to heavy menstrual flow and cramps. She also describes increased situational stressors at home and has had more frequent painful crisis. She reports fever, low-grade of 28F and cough for the last couple of days which is dry and productive.     Review of Systems: negative for the following  Constitutional: Negative for fever.  HENT: Negative for sore throat and rhinorrhea.  Eyes: Negative for redness.  Respiratory: Positive for shortness of breath. Negative for cough.  Cardiovascular: Positive for chest pain.  Gastrointestinal: Negative for nausea, vomiting, abdominal pain and diarrhea.  Genitourinary: Negative for dysuria.  Musculoskeletal: Positive for back pain and arthralgias. Negative for myalgias.  Skin: Negative for rash.  Neurological: Negative for headaches        Past Medical History  Diagnosis Date  . PMDD (premenstrual dysphoric disorder)   . Sickle cell anemia   . Sickle cell disease without crisis 07/15/2011  . Migraine with aura 07/15/2011     Past Surgical History  Procedure Laterality Date  . Cesarean section        Social History:  reports that she has never smoked. She does not have any smokeless tobacco history on file. She reports that she does not drink alcohol or  use illicit drugs.    Allergies  Allergen Reactions  . Sulfa Antibiotics Other (See Comments)    Increased bilirubin    Family History  Problem Relation Age of Onset  . Hypertension Mother      Prior to Admission medications   Medication Sig Start Date End Date Taking? Authorizing Provider  citalopram (CELEXA) 20 MG tablet Take 1 tablet (20 mg total) by mouth daily. 10/20/12  Yes Grayce Sessions, NP  folic acid (FOLVITE) 1 MG tablet Take 1 mg by mouth daily.   Yes Historical Provider, MD  ibuprofen (ADVIL,MOTRIN) 800 MG tablet Take 1 tablet (800 mg total) by mouth every 8 (eight) hours as needed for pain. 11/20/12  Yes Grayce Sessions, NP  levalbuterol [facility] HFA) 45 MCG/ACT inhaler Inhale 2 puffs into the lungs 2 (two) times daily as needed for wheezing or shortness of breath.    Yes Historical Provider, MD  LORazepam (ATIVAN) 1 MG tablet Take 1 tablet (1 mg total) by mouth every 6 (six) hours as needed for anxiety. 12/16/12  Yes Grayce Sessions, NP  Oxycodone HCl 10 MG TABS Take 10-15 mg by mouth every 6 (six) hours as needed (for pain).   Yes Historical Provider, MD  oxymorphone (OPANA) 10 MG tablet Take 1 tablet (10 mg total) by mouth every morning. 12/15/12  Yes Grayce Sessions, NP  oxymorphone (OPANA) 5 MG tablet Take 1 tablet (5 mg total) by mouth at bedtime. 12/15/12  Yes Grayce Sessions, NP     Physical Exam: Filed Vitals:   12/23/12 1914 12/23/12 2143  BP: 137/88 130/75  Pulse: 102 99  Temp: 99 F (37.2 C) 99.3 F (37.4 C)  TempSrc: Oral Oral  Resp:  16  SpO2: 98% 100%     Constitutional: Vital signs reviewed. Patient is a well-developed and well-nourished in no acute distress and cooperative with exam. Alert and oriented x3.  Head: Normocephalic and atraumatic  Ear: TM normal bilaterally  Mouth: no erythema or exudates, MMM  Eyes: PERRL, EOMI, conjunctivae normal, No scleral icterus.  Neck: Supple, Trachea midline normal ROM, No JVD, mass,  thyromegaly, or carotid bruit present.  Cardiovascular: RRR, S1 normal, S2 normal, no MRG, pulses symmetric and intact bilaterally  Pulmonary/Chest: CTAB, no wheezes, rales, or rhonchi  Abdominal: Soft. Non-tender, non-distended, bowel sounds are normal, no masses, organomegaly, or guarding present.  GU: no CVA tenderness Musculoskeletal: No joint deformities, erythema, or stiffness, ROM full and no nontender Ext: no edema and no cyanosis, pulses palpable bilaterally (DP and PT)  Hematology: no cervical, inginal, or axillary adenopathy.  Neurological: A&O x3, Strenght is normal and symmetric bilaterally, cranial nerve II-XII are grossly intact, no focal motor deficit, sensory intact to light touch bilaterally.  Skin: Warm, dry and intact. No rash, cyanosis, or clubbing.  Psychiatric: Normal mood and affect. speech and behavior is normal. Judgment and thought content normal. Cognition and memory are normal.       Labs on Admission:    Basic Metabolic Panel:  Recent Labs Lab 12/23/12 2040  NA 134*  K 3.7  CL 100  CO2 25  GLUCOSE 119*  BUN 8  CREATININE 0.65  CALCIUM 9.2   Liver Function Tests:  Recent Labs Lab 12/23/12 2040  AST 17  ALT 10  ALKPHOS 65  BILITOT 2.8*  PROT 6.9  ALBUMIN 3.6   No results found for this basename: LIPASE, AMYLASE,  in the last 168 hours No results found for this basename: AMMONIA,  in the last 168 hours CBC:  Recent Labs Lab 12/23/12 2040  WBC 6.5  NEUTROABS 5.1  HGB 9.2*  HCT 27.1*  MCV 74.0*  PLT 105*   Cardiac Enzymes: No results found for this basename: CKTOTAL, CKMB, CKMBINDEX, TROPONINI,  in the last 168 hours  BNP (last 3 results) No results found for this basename: PROBNP,  in the last 8760 hours    CBG: No results found for this basename: GLUCAP,  in the last 168 hours  Radiological Exams on Admission: Dg Chest 2 View  12/23/2012   *RADIOLOGY REPORT*  Clinical Data: Chest pain and shortness of breath  CHEST - 2  VIEW  Comparison: 12/06/2011  Findings: Lung volumes are low with crowding of the bronchovascular markings.  Heart size is normal.  Mild central vascular congestion without overt edema.  No pleural effusion.  No acute osseous finding.  IMPRESSION: Mild central vascular congestion, unchanged, without new focal acute finding.   Original Report Authenticated By: Christiana Pellant, M.D.    EKG: Independently reviewed.    Assessment/Plan Active Problems:   * No active hospital problems. *  (HGB Hgb [place] Reticulocyte mildly elevated Hemoglobin at her baseline Start the patient on IV hydration and half normal saline Dilaudid for pain control Toradol for pain control Will check LDH Folic acid No indication for transfusion at this time Urinalysis negative Chest x-ray negative Sickle cell service to resume care in the morning, PCP Dr. Marthann Schiller For the cough the patient will be started on azithromycin empirically  Code Status:   full Family Communication: bedside Disposition Plan: admit  Time spent: 70 mins   [facility] Pager [phone]  If 7PM-7AM, please contact night-coverage [email or web address] Password [facility] 12/24/2012, 12:59 AM

## 2012-12-24 NOTE — ED Provider Notes (Signed)
Medical screening examination/treatment/procedure(s) were performed by non-physician practitioner and as supervising physician I was immediately available for consultation/collaboration.  Layla Maw Clessie Karras, DO 12/24/12 [phone]

## 2012-12-24 NOTE — Care Management Note (Addendum)
    Page 1 of 1   12/29/2012     11:54:08 AM   CARE MANAGEMENT NOTE 12/29/2012  Patient:  Cassie Montgomery, Cassie Montgomery   Account Number:  [id number]  Date Initiated:  12/24/2012  Documentation initiated by:  Lanier Clam  Subjective/Objective Assessment:   ADMITTED W/SICKLE CELL CRISIS.     Action/Plan:   FROM HOME.HAS PCP,PHARMACY.   Anticipated DC Date:  12/29/2012   Anticipated DC Plan:  HOME/SELF CARE      DC Planning Services  CM consult      Choice offered to / List presented to:             Status of service:  Completed, signed off Medicare Important Message given?   (If response is "NO", the following Medicare IM given date fields will be blank) Date Medicare IM given:   Date Additional Medicare IM given:    Discharge Disposition:  HOME/SELF CARE  Per UR Regulation:  Reviewed for med. necessity/level of care/duration of stay  If discussed at Long Length of Stay Meetings, dates discussed:    Comments:  12/29/12 Zhanna Melin RN,BSN NCM [phone] D/C HOME NO NEEDS OR ORDERS.  12/28/12 Azaiah Mello RN,BSN NCM [phone] CHEST PAIN,WHEEZING,VOMITING.IV DILAUDID.  12/24/12 Varvara Legault RN,BSN NCM [phone]

## 2012-12-24 NOTE — Progress Notes (Signed)
SICKLE CELL SERVICE PROGRESS NOTE  Correen Bubolz ZOX:[id number] DOB: Jan 15, 1971 DOA: 12/23/2012 PCP: MATTHEWS,MICHELLE A., MD  Assessment/Plan: Active Problems:   1. Hb [place] with crisis: Pt has been exerting herself with getting her children ready for school. She has also been cleaning carpets. Will continue IVF. Also will schedule oral medications and continue clinician assisted doses.  Also continue Toradol. 2. Acute Bronchitis: Pt has been started empirically on Azithromycin and continued on Xopenex 3. Hypokalemia: replace orally.  Code Status: Full Code Family Communication: N/A Disposition Plan: Home hopefully in 24-48 hours.  MATTHEWS,MICHELLE A.  Pager [phone]. If 7PM-7AM, please contact night-coverage.  12/24/2012, 11:22 AM  LOS: 1 day   Brief narrative: 42 year old female with a history of Hgb [place] disease -- presents with c/o lower and upper extremity, back, chest pain, mild SOB that began yesterday and worsened today. Patient took her usual oxycodone and Opana without relief. She denies fever, cold symptoms, cough, urinary symptoms, rash. She has been under a lot of stress because of her children. Onset of symptoms gradual. Course is constant. Nothing makes symptoms better or worse.  She has been feeling increasingly tired and weak secondary to heavy menstrual flow and cramps. She also describes increased situational stressors at home and has had more frequent painful crisis. She reports fever, low-grade of 13F and cough for the last couple of days which is dry and productive   Consultants:  None  Procedures:  None  Antibiotics:  Azithromycin 8/13 >>  HPI/Subjective: Pt states that she feels better this morning. She is still having pain all over however intensity is decreased.  Objective: Filed Vitals:   12/24/12 0127 12/24/12 0257 12/24/12 0550 12/24/12 0955  BP: 125/58 141/89 126/80   Pulse: 103 96 97   Temp: 99.3 F (37.4 C) 98.1 F (36.7 C) 98.1 F (36.7  C)   TempSrc: Oral Oral Oral   Resp: 16 18 20    Height:  5\' 6"  (1.676 m)    Weight:  236 lb 1.8 oz (107.1 kg)    SpO2: 97% 100% 100% 94%   Weight change:   Intake/Output Summary (Last 24 hours) at 12/24/12 1122 Last data filed at 12/24/12 0700  Gross per 24 hour  Intake    120 ml  Output   1150 ml  Net  -1030 ml    General: Alert, awake, oriented x3, in no acute distress.  HEENT: [place]/AT PEERL, EOMI, anicteric OROPHARYNX:  Moist, No exudate/ erythema/lesions.  Heart: Regular rate and rhythm, without murmurs, rubs, gallops.  Lungs: Clear to auscultation, no wheezing or rhonchi noted. No increased vocal fremitus resonant to percussion  Abdomen: Soft, nontender, nondistended, positive bowel sounds, no masses no hepatosplenomegaly noted.  Neuro: No focal neurological deficits noted cranial nerves II through XII grossly intact. Strength normal in bilateral upper and lower extremities. Musculoskeletal: No warm swelling or erythema around joints, no spinal tenderness noted. Psychiatric: Patient alert and oriented x3, good insight and cognition, good recent to remote recall.   Data Reviewed: Basic Metabolic Panel:  Recent Labs Lab 12/23/12 2040 12/24/12 0213 12/24/12 0450  NA 134*  --  136  K 3.7  --  3.1*  CL 100  --  105  CO2 25  --  28  GLUCOSE 119*  --  104*  BUN 8  --  4*  CREATININE 0.65  --  0.69  CALCIUM 9.2  --  8.2*  MG  --  1.6  --    Liver Function Tests:  Recent Labs Lab 12/23/12 2040 12/24/12 0450  AST 17 13  ALT 10 8  ALKPHOS 65 53  BILITOT 2.8* 2.2*  PROT 6.9 6.0  ALBUMIN 3.6 3.1*   CBC:  Recent Labs Lab 12/23/12 2040 12/24/12 0213  WBC 6.5 5.4  NEUTROABS 5.1 3.8  HGB 9.2* 9.0*  HCT 27.1* 26.3*  MCV 74.0* 73.7*  PLT 105* 97*    Studies: Dg Chest 2 View  12/23/2012   *RADIOLOGY REPORT*  Clinical Data: Chest pain and shortness of breath  CHEST - 2 VIEW  Comparison: 12/06/2011  Findings: Lung volumes are low with crowding of the  bronchovascular markings.  Heart size is normal.  Mild central vascular congestion without overt edema.  No pleural effusion.  No acute osseous finding.  IMPRESSION: Mild central vascular congestion, unchanged, without new focal acute finding.   Original Report Authenticated By: Christiana Pellant, M.D.    Scheduled Meds: . azithromycin  500 mg Oral Daily  . citalopram  20 mg Oral Daily  . docusate sodium  100 mg Oral BID  . enoxaparin (LOVENOX) injection  40 mg Subcutaneous Q24H  . folic acid  1 mg Oral Daily  . ketorolac  15 mg Intravenous Q6H  . oxyCODONE  15 mg Oral Q4H  . potassium chloride  40 mEq Oral Q2H  . senna  1 tablet Oral q12n4p   Continuous Infusions: . dextrose 5 % and 0.45% NaCl 125 mL/hr at 12/24/12 0930    Time Spent 30 minutes.

## 2012-12-24 NOTE — ED Notes (Signed)
[name], MD at bedside.

## 2012-12-25 DIAGNOSIS — D571 Sickle-cell disease without crisis: Secondary | ICD-10-CM

## 2012-12-25 DIAGNOSIS — R0609 Other forms of dyspnea: Secondary | ICD-10-CM

## 2012-12-25 DIAGNOSIS — T50904A Poisoning by unspecified drugs, medicaments and biological substances, undetermined, initial encounter: Secondary | ICD-10-CM

## 2012-12-25 DIAGNOSIS — F19921 Other psychoactive substance use, unspecified with intoxication with delirium: Secondary | ICD-10-CM

## 2012-12-25 DIAGNOSIS — R0989 Other specified symptoms and signs involving the circulatory and respiratory systems: Secondary | ICD-10-CM

## 2012-12-25 LAB — CBC WITH DIFFERENTIAL/PLATELET
Basophils Relative: 1 % (ref 0–1)
HCT: 24.6 % — ABNORMAL LOW (ref 36.0–46.0)
Hemoglobin: 8.4 g/dL — ABNORMAL LOW (ref 12.0–15.0)
Lymphocytes Relative: 30 % (ref 12–46)
MCHC: 34.1 g/dL (ref 30.0–36.0)
Monocytes Absolute: 0.5 10*3/uL (ref 0.1–1.0)
Monocytes Relative: 8 % (ref 3–12)
Neutro Abs: 3.1 10*3/uL (ref 1.7–7.7)

## 2012-12-25 LAB — COMPREHENSIVE METABOLIC PANEL
BUN: 8 mg/dL (ref 6–23)
CO2: 29 mEq/L (ref 19–32)
Chloride: 104 mEq/L (ref 96–112)
Creatinine, Ser: 0.73 mg/dL (ref 0.50–1.10)
GFR calc non Af Amer: >90 mL/min (ref >90)
Total Bilirubin: 2 mg/dL — ABNORMAL HIGH (ref 0.3–1.2)

## 2012-12-25 LAB — MAGNESIUM: Magnesium: 1.7 mg/dL (ref 1.5–2.5)

## 2012-12-25 MED ORDER — DIPHENHYDRAMINE HCL 50 MG/ML IJ SOLN
12.5000 mg | Freq: Once | INTRAMUSCULAR | Status: AC
  Administered 2012-12-25: 12.5 mg via INTRAVENOUS

## 2012-12-25 MED ORDER — IPRATROPIUM BROMIDE 0.02 % IN SOLN
0.5000 mg | RESPIRATORY_TRACT | Status: DC | PRN
  Filled 2012-12-25 (×2): qty 2.5

## 2012-12-25 MED ORDER — HYDROMORPHONE HCL PF 2 MG/ML IJ SOLN
2.0000 mg | INTRAMUSCULAR | Status: DC | PRN
  Administered 2012-12-25 – 2012-12-29 (×22): 2 mg via INTRAVENOUS
  Filled 2012-12-25 (×23): qty 1

## 2012-12-25 MED ORDER — HYDROMORPHONE HCL PF 1 MG/ML IJ SOLN: 1.0000 mg | Freq: Once | INTRAMUSCULAR | Status: DC

## 2012-12-25 MED ORDER — HYDROCORTISONE 2.5 % RE CREA
TOPICAL_CREAM | Freq: Two times a day (BID) | RECTAL | Status: DC
  Administered 2012-12-25 – 2012-12-28 (×4): via RECTAL
  Filled 2012-12-25: qty 28.35

## 2012-12-25 MED ORDER — DIPHENHYDRAMINE HCL 50 MG/ML IJ SOLN
12.5000 mg | Freq: Once | INTRAMUSCULAR | Status: AC
  Administered 2012-12-25: 12.5 mg via INTRAVENOUS
  Filled 2012-12-25: qty 1

## 2012-12-25 MED ORDER — ALBUTEROL SULFATE (5 MG/ML) 0.5% IN NEBU
2.5000 mg | INHALATION_SOLUTION | RESPIRATORY_TRACT | Status: DC | PRN
  Administered 2012-12-26: 2.5 mg via RESPIRATORY_TRACT
  Filled 2012-12-25 (×3): qty 0.5

## 2012-12-25 MED ORDER — CAMPHOR-MENTHOL 0.5-0.5 % EX LOTN
TOPICAL_LOTION | CUTANEOUS | Status: DC | PRN
  Administered 2012-12-25: 08:00:00 via TOPICAL
  Filled 2012-12-25: qty 222

## 2012-12-25 NOTE — Progress Notes (Addendum)
Early this afternoon, pt was found to be lethargic but arousable during afternoon med pass.  Pt's respirations were 11 while sleeping.  Other vital signs checked and were stable.  Pt's MD was notified.  New orders obtained.  Evening scheduled pain medication held.  Will continue to monitor very closely.  Pt's bed alarm was also activated due to patient's change in status.

## 2012-12-26 ENCOUNTER — Inpatient Hospital Stay (HOSPITAL_COMMUNITY): Payer: Medicare Other

## 2012-12-26 DIAGNOSIS — T40605A Adverse effect of unspecified narcotics, initial encounter: Secondary | ICD-10-CM

## 2012-12-26 DIAGNOSIS — F329 Major depressive disorder, single episode, unspecified: Secondary | ICD-10-CM

## 2012-12-26 DIAGNOSIS — F411 Generalized anxiety disorder: Secondary | ICD-10-CM

## 2012-12-26 LAB — BLOOD GAS, ARTERIAL
FIO2: 0.21 %
Patient temperature: 98.6
TCO2: 26.3 mmol/L (ref 0–100)
pH, Arterial: 7.348 — ABNORMAL LOW (ref 7.350–7.450)

## 2012-12-26 NOTE — Progress Notes (Signed)
Subjective: 42 year old female admitted with sickle cell painful crisis. Patient is noted to have decreased respiratory drive as well as confusion. She is on Dilaudid 4 mg every 2 hours. She describes her pain as 8/10, she's not feeling good, denies shortness of breath cough or chest pain. No nausea vomiting or diarrhea.  Objective: Vital signs in last 24 hours: Temp:  [98.2 F (36.8 C)-98.5 F (36.9 C)] 98.2 F (36.8 C) (08/56 0517) Pulse Rate:  [85-93] 93 (08/15 0517) Resp:  [11-20] 13 (08/16 0517) BP: (119-152)/(61-101) 130/74 mmHg (08/15 0517) SpO2:  [97 %-100 %] 100 % (08/15 0517) Weight change:  Last BM Date: 12/23/12  Intake/Output from previous day: 08/14 0701 - 08/15 0700 In: 3756.3 [P.O.:1200; I.V.:2556.3] Out: 600 [Urine:600] Intake/Output this shift:    General appearance: delirious, distracted and no distress Eyes: conjunctivae/corneas clear. PERRL, EOM's intact. Fundi benign. Back: symmetric, no curvature. ROM normal. No CVA tenderness. Resp: clear to auscultation bilaterally Chest wall: no tenderness Cardio: regular rate and rhythm, S1, S2 normal, no murmur, click, rub or gallop GI: soft, non-tender; bowel sounds normal; no masses,  no organomegaly Extremities: extremities normal, atraumatic, no cyanosis or edema Pulses: 2+ and symmetric Skin: Skin color, texture, turgor normal. No rashes or lesions Neurologic: Grossly normal  Lab Results:  Recent Labs  12/24/12 0213 12/25/12 0450  WBC 5.4 5.7  HGB 9.0* 8.4*  HCT 26.3* 24.6*  PLT 97* 98*   BMET  Recent Labs  12/24/12 0450 12/25/12 0450  NA 136 138  K 3.1* 3.9  CL 105 104  CO2 28 29  GLUCOSE 104* 113*  BUN 4* 8  CREATININE 0.69 0.73  CALCIUM 8.2* 8.7    Studies/Results: No results found.  Medications: I have reviewed the patient's current medications.  Assessment/Plan: A 42 year old female with known sickle cell disease admitted with sickle cell painful crisis.  #1 sickle cell  painful crisis: Patient does not appear to be in severe pain. She slightly delirious. I will decrease her Dilaudid dose to 2 mg every 2 hours as needed. Continue with the Toradol.  #2 hypokalemia: Replete this with potassium.  #3 respiratory issues: Patient likely having early respiratory depression from excessive narcotics. She is not irregular sickle cell patient that comes to the hospital. The current dose therefore may be too strong for her. We will reduce the dose and monitor her closely.  #4 sickle cell anemia: No evidence of active hemolysis. Continue to monitor her hemoglobin.  #5 depression/anxiety: Continue Celexa and Ativan as per home dose.   LOS: 2 days   GARBA,LAWAL 12/25/2012, 11:41 AM

## 2012-12-26 NOTE — Progress Notes (Signed)
Went to check on bed alarm, pt was up with her shirt off, had it all twisted up in her IV, stated she didn't even remember how she got that shirt on.  I told her she put it on after her bath and reminded her of how she was so forgetful and sleepy today.  She doesn't remember but states it's because "she needed sleep".

## 2012-12-26 NOTE — Progress Notes (Signed)
Pt very forgetful, ate breakfast then trying to clean her breakfast dishes, 5 minutes later asking where her food went. I explained she'd eaten it already then pt was pretending to eat with no fork in hand or food on her plate.  Very restless, tech went in after me to silence bed alarm and found pt in bed with her head at the foot of the bed.  No PRNs given this am

## 2012-12-26 NOTE — Progress Notes (Signed)
Subjective: 42 year old female admitted with sickle cell painful crisis. Patient has otherwise been forgetful, sleepy and tired. Her pain is reportedly 7/10. The dose of her Dilaudid was lowered yesterday due to confusion and respiratory distress. She is still on Benadryl folic acid and antibiotics. No evidence of acute chest syndrome as of yesterday. No fever and no significant increase in white count.  Objective: Vital signs in last 24 hours: Temp:  [98.2 F (36.8 C)-98.5 F (36.9 C)] 98.2 F (36.8 C) (08/16 0517) Pulse Rate:  [85-93] 93 (08/16 0517) Resp:  [11-20] 13 (08/16 0517) BP: (119-152)/(61-101) 130/74 mmHg (08/16 0517) SpO2:  [97 %-100 %] 100 % (08/16 0517) Weight change:  Last BM Date: 12/23/12  Intake/Output from previous day: 08/15 0701 - 08/16 0700 In: 3756.3 [P.O.:1200; I.V.:2556.3] Out: 600 [Urine:600] Intake/Output this shift:    General appearance: delirious, distracted and no distress Eyes: conjunctivae/corneas clear. PERRL, EOM's intact. Fundi benign. Back: symmetric, no curvature. ROM normal. No CVA tenderness. Resp: clear to auscultation bilaterally Chest wall: no tenderness Cardio: regular rate and rhythm, S1, S2 normal, no murmur, click, rub or gallop GI: soft, non-tender; bowel sounds normal; no masses,  no organomegaly Extremities: extremities normal, atraumatic, no cyanosis or edema Pulses: 2+ and symmetric Skin: Skin color, texture, turgor normal. No rashes or lesions Neurologic: Grossly normal  Lab Results:  Recent Labs  12/24/12 0213 12/25/12 0450  WBC 5.4 5.7  HGB 9.0* 8.4*  HCT 26.3* 24.6*  PLT 97* 98*   BMET  Recent Labs  12/24/12 0450 12/25/12 0450  NA 136 138  K 3.1* 3.9  CL 105 104  CO2 28 29  GLUCOSE 104* 113*  BUN 4* 8  CREATININE 0.69 0.73  CALCIUM 8.2* 8.7    Studies/Results: No results found.  Medications: I have reviewed the patient's current medications.  Assessment/Plan: A 42 year old female with known  sickle cell disease admitted with sickle cell painful crisis.  #1 sickle cell painful crisis: Patient is on the low date folic acid as well as Toradol. We will continue to titrate her pain medicine down and consider adjusting her oral medications.  #2 hypokalemia: This has resolved.  #3 respiratory issues: Patient now on when necessary nebulizer. We will check a chest x-ray to be sure she does not have a pneumonia  #4 sickle cell anemia: Her hemoglobin is close to her baseline. We will recheck in the morning.  #5 depression/anxiety: Continue Celexa and Ativan as needed  #6 mild confusion: Probably from the Dilaudid and other medications. We'll check the chest x-ray and possibly an ABG if needed. We'll adjust her medicines so we can only use them when necessary.  LOS: 3 days   Usama Harkless,LAWAL 12/26/2012, 1:33 PM

## 2012-12-27 LAB — CBC WITH DIFFERENTIAL/PLATELET
HCT: 24.5 % — ABNORMAL LOW (ref 36.0–46.0)
Lymphocytes Relative: 27 % (ref 12–46)
Lymphs Abs: 2 10*3/uL (ref 0.7–4.0)
MCH: 25.3 pg — ABNORMAL LOW (ref 26.0–34.0)
MCHC: 33.9 g/dL (ref 30.0–36.0)
Monocytes Relative: 8 % (ref 3–12)
Neutro Abs: 4.4 10*3/uL (ref 1.7–7.7)
RDW: 17.4 % — ABNORMAL HIGH (ref 11.5–15.5)

## 2012-12-27 MED ORDER — ALUM & MAG HYDROXIDE-SIMETH 200-200-20 MG/5ML PO SUSP
30.0000 mL | ORAL | Status: DC | PRN
  Administered 2012-12-27: 30 mL via ORAL
  Filled 2012-12-27: qty 30

## 2012-12-27 MED ORDER — ALBUTEROL SULFATE (5 MG/ML) 0.5% IN NEBU
2.5000 mg | INHALATION_SOLUTION | RESPIRATORY_TRACT | Status: DC | PRN
  Administered 2012-12-29: 2.5 mg via RESPIRATORY_TRACT
  Filled 2012-12-27: qty 0.5

## 2012-12-27 MED ORDER — IPRATROPIUM BROMIDE 0.02 % IN SOLN
0.5000 mg | RESPIRATORY_TRACT | Status: DC | PRN
  Administered 2012-12-29: 0.5 mg via RESPIRATORY_TRACT
  Filled 2012-12-27: qty 2.5

## 2012-12-27 MED ORDER — LEVALBUTEROL TARTRATE 45 MCG/ACT IN AERO
2.0000 | INHALATION_SPRAY | Freq: Four times a day (QID) | RESPIRATORY_TRACT | Status: DC
  Administered 2012-12-27 – 2012-12-29 (×7): 2 via RESPIRATORY_TRACT
  Filled 2012-12-27: qty 15

## 2012-12-27 NOTE — Progress Notes (Signed)
Pt c/o vomiting "greasy liquid", states her throat is burning.  Phenergan given and md paged for mylanta or PPI.

## 2012-12-27 NOTE — Progress Notes (Signed)
Subjective: Patient is now a little more awake but still having pain at 7/10. She is having some nausea but no vomiting. No SOB no cough today. She has not been mobile since admission. No fever no diarrhea. She believes when she has any attack, she needs to sleep to address it. No other complaints otherwise. Has not been a regular patient.  Objective: Vital signs in last 24 hours: Temp:  [98.1 F (36.7 C)-98.4 F (36.9 C)] 98.2 F (36.8 C) (08/17 0520) Pulse Rate:  [84-99] 85 (08/17 0520) Resp:  [18-20] 18 (08/17 0520) BP: (126-149)/(72-98) 142/93 mmHg (08/17 0520) SpO2:  [97 %-100 %] 97 % (08/17 0917) Weight:  [113.671 kg (250 lb 9.6 oz)] 113.671 kg (250 lb 9.6 oz) (08/17 0520) Weight change:  Last BM Date: 12/26/12  Intake/Output from previous day: 08/16 0701 - 08/17 0700 In: 4155 [P.O.:1280; I.V.:2875] Out: -  Intake/Output this shift: Total I/O In: 480 [P.O.:480] Out: -   General appearance: delirious, distracted and no distress Eyes: conjunctivae/corneas clear. PERRL, EOM's intact. Fundi benign. Back: symmetric, no curvature. ROM normal. No CVA tenderness. Resp: clear to auscultation bilaterally Chest wall: no tenderness Cardio: regular rate and rhythm, S1, S2 normal, no murmur, click, rub or gallop GI: soft, non-tender; bowel sounds normal; no masses,  no organomegaly Extremities: extremities normal, atraumatic, no cyanosis or edema Pulses: 2+ and symmetric Skin: Skin color, texture, turgor normal. No rashes or lesions Neurologic: Grossly normal  Lab Results:  Recent Labs  12/25/12 0450 12/27/12 0508  WBC 5.7 7.6  HGB 8.4* 8.3*  HCT 24.6* 24.5*  PLT 98* 106*   BMET  Recent Labs  12/25/12 0450  NA 138  K 3.9  CL 104  CO2 29  GLUCOSE 113*  BUN 8  CREATININE 0.73  CALCIUM 8.7    Studies/Results: Dg Chest 2 View  12/26/2012   CLINICAL DATA:  Wheezing. Asthma.  EXAM: CHEST  2 VIEW  COMPARISON:  12/23/2012  FINDINGS: The heart size and mediastinal  contours are within normal limits. Both lungs are clear. The visualized skeletal structures are unremarkable.  IMPRESSION: No active cardiopulmonary disease.   Electronically Signed   By: Myles Rosenthal   On: 12/26/2012 15:02    Medications: I have reviewed the patient's current medications.  Assessment/Plan: A 42 year old female with known sickle cell disease admitted with sickle cell painful crisis.  #1 sickle cell painful crisis: Patient's dilaudid dose was reduced and held while she was obtunded. We will resume that today. We will continue to monitor her respiratory and mental status closely..  #2 hypokalemia: This has resolved.  #3 respiratory issues: Resolved. CXR shows no apparent cause.  #4 sickle cell anemia: No active hemolysis.  #5 depression/anxiety: Continue Celexa and Ativan as needed  #6 mild confusion: Resolved.   LOS: 4 days   Erinne Gillentine,LAWAL 12/27/2012, 12:48 PM

## 2012-12-27 NOTE — Progress Notes (Signed)
Pt having some expiratory wheezes, slightly productive cough. Resp therapist notified for neb treatment for wheezing. Pt able to amb to bathroom with any problelms.. No c/o of chest pain or sob. Will cont to monitor.

## 2012-12-27 NOTE — H&P (Signed)
Patient has been requesting her inhaler approximately every 6 hours. Bilateral wheezing and nonproductive, congested cough noted. RT treatment protocol assessment completed. Orders changed accordingly.

## 2012-12-28 ENCOUNTER — Telehealth: Payer: Self-pay | Admitting: Internal Medicine

## 2012-12-28 LAB — CBC WITH DIFFERENTIAL/PLATELET
Eosinophils Absolute: 0.5 10*3/uL (ref 0.0–0.7)
Eosinophils Relative: 7 % — ABNORMAL HIGH (ref 0–5)
HCT: 24.1 % — ABNORMAL LOW (ref 36.0–46.0)
Lymphocytes Relative: 26 % (ref 12–46)
Lymphs Abs: 1.7 10*3/uL (ref 0.7–4.0)
MCH: 25.5 pg — ABNORMAL LOW (ref 26.0–34.0)
MCV: 74.8 fL — ABNORMAL LOW (ref 78.0–100.0)
Monocytes Absolute: 0.5 10*3/uL (ref 0.1–1.0)
Monocytes Relative: 8 % (ref 3–12)
RBC: 3.22 MIL/uL — ABNORMAL LOW (ref 3.87–5.11)
WBC: 6.6 10*3/uL (ref 4.0–10.5)

## 2012-12-28 MED ORDER — SIMETHICONE 40 MG/0.6ML PO SUSP
40.0000 mg | Freq: Four times a day (QID) | ORAL | Status: DC
  Filled 2012-12-28 (×2): qty 0.6

## 2012-12-28 MED ORDER — SIMETHICONE 80 MG PO CHEW
80.0000 mg | CHEWABLE_TABLET | Freq: Four times a day (QID) | ORAL | Status: DC
  Administered 2012-12-28 – 2012-12-29 (×3): 80 mg via ORAL
  Filled 2012-12-28 (×6): qty 1

## 2012-12-28 NOTE — Telephone Encounter (Signed)
Patient is presently in the hospital and refill of her oxycodone 10mg  Q 6 hrs prn last received # 90 on 12/17/12

## 2012-12-29 DIAGNOSIS — T50904A Poisoning by unspecified drugs, medicaments and biological substances, undetermined, initial encounter: Secondary | ICD-10-CM

## 2012-12-29 DIAGNOSIS — F19921 Other psychoactive substance use, unspecified with intoxication with delirium: Secondary | ICD-10-CM

## 2012-12-29 DIAGNOSIS — D649 Anemia, unspecified: Secondary | ICD-10-CM

## 2012-12-29 DIAGNOSIS — D5700 Hb-SS disease with crisis, unspecified: Principal | ICD-10-CM

## 2012-12-29 LAB — CBC WITH DIFFERENTIAL/PLATELET
Basophils Absolute: 0 10*3/uL (ref 0.0–0.1)
Eosinophils Absolute: 0.4 10*3/uL (ref 0.0–0.7)
Eosinophils Relative: 6 % — ABNORMAL HIGH (ref 0–5)
HCT: 23.9 % — ABNORMAL LOW (ref 36.0–46.0)
Lymphocytes Relative: 21 % (ref 12–46)
MCH: 26.4 pg (ref 26.0–34.0)
MCHC: 35.1 g/dL (ref 30.0–36.0)
MCV: 75.2 fL — ABNORMAL LOW (ref 78.0–100.0)
Monocytes Absolute: 0.4 10*3/uL (ref 0.1–1.0)
RDW: 17.7 % — ABNORMAL HIGH (ref 11.5–15.5)
WBC: 6.6 10*3/uL (ref 4.0–10.5)

## 2012-12-29 LAB — COMPREHENSIVE METABOLIC PANEL
AST: 25 U/L (ref 0–37)
CO2: 27 mEq/L (ref 19–32)
Calcium: 8.7 mg/dL (ref 8.4–10.5)
Creatinine, Ser: 0.69 mg/dL (ref 0.50–1.10)
GFR calc Af Amer: >90 mL/min (ref >90)
GFR calc non Af Amer: >90 mL/min (ref >90)
Total Protein: 6 g/dL (ref 6.0–8.3)

## 2012-12-29 MED ORDER — OXYCODONE HCL 10 MG PO TABS: 15.0000 mg | ORAL_TABLET | Freq: Four times a day (QID) | ORAL | Status: DC | PRN

## 2012-12-29 NOTE — Progress Notes (Signed)
Subjective: Patient feeling weak today. She is otherwise doing fine. She has been able to eat and drink and moved around the room. Her pain is at 6/10 involving her back and legs. No other complaints. She has been able to stay awake since yesterday. Has had bowel movement today but feels bloated with some abdominal distension. No fever, no shortness of breath. Objective: Vital signs in last 24 hours: Temp:  [98.4 F (36.9 C)-98.6 F (37 C)] 98.6 F (37 C) (08/18 0649) Pulse Rate:  [91-96] 91 (08/18 0649) Resp:  [18-20] 20 (08/18 0649) BP: (116-146)/(71-100) 146/86 mmHg (08/18 0649) SpO2:  [99 %-100 %] 100 % (08/18 0649) Weight change:  Last BM Date: 12/27/12  Intake/Output from previous day: 08/17 0701 - 08/18 0700 In: 1680 [P.O.:1080; I.V.:600] Out: -  Intake/Output this shift:    General appearance: delirious, distracted and no distress Eyes: conjunctivae/corneas clear. PERRL, EOM's intact. Fundi benign. Back: symmetric, no curvature. ROM normal. No CVA tenderness. Resp: clear to auscultation bilaterally Chest wall: no tenderness Cardio: regular rate and rhythm, S1, S2 normal, no murmur, click, rub or gallop GI: soft, non-tender; bowel sounds normal; no masses,  no organomegaly Extremities: extremities normal, atraumatic, no cyanosis or edema Pulses: 2+ and symmetric Skin: Skin color, texture, turgor normal. No rashes or lesions Neurologic: Grossly normal  Lab Results:  Recent Labs  12/28/12 0440  WBC 6.6  HGB 8.2*  HCT 24.1*  PLT 108*   BMET No results found for this basename: NA, K, CL, CO2, GLUCOSE, BUN, CREATININE, CALCIUM,  in the last 72 hours  Studies/Results: No results found.  Medications: I have reviewed the patient's current medications.  Assessment/Plan: A 42 year old female with known sickle cell disease admitted with sickle cell painful crisis.  #1 sickle cell painful crisis:Patient remains awake but still having pain at 7/10. She is now a little  more mobile and ready to resume her home medications. No fever, no NVD.  #2 Weakness: Mobilize patient today. Most likely due to extended bed-ridding.  #3 respiratory issues: Resolved.  #4 sickle cell anemia: No active hemolysis. H/H still remains stable  #5 depression/anxiety: Continue Celexa and Ativan as needed. She seems to be in good spirit.  #6 mild confusion: Resolved.   #7 Bloating: Abdomen distended and tymphanitic but had BM today. Will try anti-Gas and mobilize. If not better, check KUB.  #8 Disposition: Will likely DC in 1-2 days.  LOS: 5 days   Estephany Perot,LAWAL 12/28/2012, 11:31 AM

## 2012-12-29 NOTE — Discharge Summary (Signed)
Cassie Montgomery MRN: [id number] DOB/AGE: 43-Aug-1972 43 y.o.  Admit date: 12/23/2012 Discharge date: 12/29/2012  Primary Care Physician:  MATTHEWS,MICHELLE A., MD   Discharge Diagnoses:   Patient Active Problem List   Diagnosis Date Noted  . Delirium, drug-induced 12/26/2012  . Narcotic respiratory distress 12/26/2012  . Depressive disorder, not elsewhere classified 10/20/2012  . Generalized anxiety disorder 10/20/2012  . Sickle cell crisis 12/07/2011  . Anemia 12/07/2011  . Leg pain 12/07/2011  . Thrombocytopenia 12/07/2011  . Sickle cell disease without crisis 07/15/2011    DISCHARGE MEDICATION:   Medication List    STOP taking these medications       oxymorphone 10 MG tablet  Commonly known as:  OPANA     oxymorphone 5 MG tablet  Commonly known as:  OPANA      TAKE these medications       citalopram 20 MG tablet  Commonly known as:  CELEXA  Take 1 tablet (20 mg total) by mouth daily.     folic acid 1 MG tablet  Commonly known as:  FOLVITE  Take 1 mg by mouth daily.     ibuprofen 800 MG tablet  Commonly known as:  ADVIL,MOTRIN  Take 1 tablet (800 mg total) by mouth every 8 (eight) hours as needed for pain.     levalbuterol 45 MCG/ACT inhaler  Commonly known as:  XOPENEX HFA  Inhale 2 puffs into the lungs 2 (two) times daily as needed for wheezing or shortness of breath.     LORazepam 1 MG tablet  Commonly known as:  ATIVAN  Take 1 tablet (1 mg total) by mouth every 6 (six) hours as needed for anxiety.     Oxycodone HCl 10 MG Tabs  Take 1.5 tablets (15 mg total) by mouth every 6 (six) hours as needed (for pain).         SIGNIFICANT DIAGNOSTIC STUDIES:  Dg Chest 2 View  12/26/2012   CLINICAL DATA:  Wheezing. Asthma.  EXAM: CHEST  2 VIEW  COMPARISON:  12/23/2012  FINDINGS: The heart size and mediastinal contours are within normal limits. Both lungs are clear. The visualized skeletal structures are unremarkable.  IMPRESSION: No active cardiopulmonary  disease.   Electronically Signed   By: Myles Rosenthal   On: 12/26/2012 15:02   Dg Chest 2 View  12/23/2012   *RADIOLOGY REPORT*  Clinical Data: Chest pain and shortness of breath  CHEST - 2 VIEW  Comparison: 12/06/2011  Findings: Lung volumes are low with crowding of the bronchovascular markings.  Heart size is normal.  Mild central vascular congestion without overt edema.  No pleural effusion.  No acute osseous finding.  IMPRESSION: Mild central vascular congestion, unchanged, without new focal acute finding.   Original Report Authenticated By: Christiana Pellant, M.D.        Recent Results (from the past 240 hour(s))  CULTURE, BLOOD (ROUTINE X 2)     Status: None   Collection Time    12/24/12  2:13 AM      Result Value Range Status   Specimen Description BLOOD RIGHT ANTECUBITAL   Final   Special Requests BOTTLES DRAWN AEROBIC AND ANAEROBIC 4CC   Final   Culture  Setup Time     Final   Value: 12/24/2012 10:23     Performed at [facility]   Culture     Final   Value:        BLOOD CULTURE RECEIVED NO GROWTH TO DATE CULTURE WILL BE HELD  FOR 5 DAYS BEFORE ISSUING A FINAL NEGATIVE REPORT     Performed at [facility]   Report Status PENDING   Incomplete  CULTURE, BLOOD (ROUTINE X 2)     Status: None   Collection Time    12/24/12  2:20 AM      Result Value Range Status   Specimen Description BLOOD LEFT ARM   Final   Special Requests BOTTLES DRAWN AEROBIC AND ANAEROBIC 5CC   Final   Culture  Setup Time     Final   Value: 12/24/2012 10:23     Performed at [facility]   Culture     Final   Value:        BLOOD CULTURE RECEIVED NO GROWTH TO DATE CULTURE WILL BE HELD FOR 5 DAYS BEFORE ISSUING A FINAL NEGATIVE REPORT     Performed at [facility]   Report Status PENDING   Incomplete    BRIEF ADMITTING H & P: 42 year old female with a history of Hgb [place]a disease -- presents with c/o lower and upper extremity, back, chest pain, mild SOB that began yesterday and  worsened today. Patient took her usual oxycodone and Opana without relief. She denies fever, cold symptoms, cough, urinary symptoms, rash. She has been under a lot of stress because of her children. Onset of symptoms gradual. Course is constant. Nothing makes symptoms better or worse.  She has been feeling increasingly tired and weak secondary to heavy menstrual flow and cramps. She also describes increased situational stressors at home and has had more frequent painful crisis. She reports fever, low-grade of 6F and cough for the last couple of days which is dry and productive.    Hospital Course:  Present on Admission:  Hb [place] with acute crisis: Pt with Hb [place] who is only rarely hospitalized for vaso-occlusive episodes was admitted to the hospital acute crisis. Is felt that the patient's crisis was treated by heavy menses in combination with increased exertion. The patient was treated with IV fluids, clinician assisted doses of IV analgesics and Toradol. The patient was transitioned to oral medications and exercise being discharged home on her usual regimen of oxycodone 15 mg every 6 hours when necessary pain. Patient's prescription from the office.  Delirium: Per nursing staff the patient had some degree of confusion which was felt to be biliary. She also had prolonged somnolence which was not associated with her pain medications. As the patient had received no significant analgesics prior to this. Upon awakening the patient states that she's had pain this is her normal routine of sleeping for long periods of time until the pain has resolved. At the time of discharge the patient was fully cognizant, alert and oriented x3 and at her baseline of mental functioning.  Acute bronchitis: The patient also has symptoms of acute bronchitis was treated with azithromycin empirically and will continue Xopenex. At the time of discharge the patient had a requirement for oxygen. She's completed her course of  azithromycin thus no further need for antibiotics.  Hypokalemia: The patient had a mild decrease in potassium which was treated with oral supplementation.  2 depression: Continue Celexa and Ativan on a when necessary basis. At the time of discharge the patient had a normal affect and was either to get back home to indicate cystic fever going back to school.  Disposition and Follow-up:  Patient is being discharged home in good condition. Patient to followup with Dr. Ashley Royalty in the clinic within one week.  Discharge Orders   Future Appointments Provider Department Dept Phone   02/15/2013 1:15 PM Altha Harm, MD [facility] [phone]   Future Orders Complete By Expires   Activity as tolerated - No restrictions  As directed    Diet general  As directed       DISCHARGE EXAM:  General appearance: delirious, distracted and no distress  Vital Signs: BP 146/86, HR 91, T 98.6 F (37 C), temperature source Oral, RR 20, height 5\' 6"  (1.676 m), weight 255 lb 8 oz (115.894 kg), last menstrual period 11/24/2012, SpO2 99.00%. Eyes: conjunctivae/corneas clear. PERRL, EOM's intact. Fundi benign.  Back: symmetric, no curvature. ROM normal. No CVA tenderness.  Resp: clear to auscultation bilaterally  Chest wall: no tenderness  Cardio: regular rate and rhythm, S1, S2 normal, no murmur, click, rub or gallop  GI: soft, non-tender; bowel sounds normal; no masses, no organomegaly  Extremities: extremities normal, atraumatic, no cyanosis or edema  Pulses: 2+ and symmetric  Skin: Skin color, texture, turgor normal. No rashes or lesions  Neurologic: Grossly normal     Recent Labs  12/28/12 0440 12/29/12 0640  WBC 6.6 6.6  NEUTROABS 3.9 4.5  HGB 8.2* 8.4*  HCT 24.1* 23.9*  MCV 74.8* 75.2*  PLT 108* 95*   Total time spent in discharge including face to face time and decision making was greater than 30 minutes. Signed: MATTHEWS,MICHELLE A. 12/29/2012, 9:55 AM

## 2012-12-30 LAB — CULTURE, BLOOD (ROUTINE X 2): Culture: NO GROWTH

## 2012-12-31 MED ORDER — OXYCODONE HCL 10 MG PO TABS: 15.0000 mg | ORAL_TABLET | Freq: Four times a day (QID) | ORAL | Status: DC | PRN

## 2012-12-31 NOTE — Telephone Encounter (Signed)
Pt called explained she may pick up her prescription on Monday 25, 2014 she was hospitalized for 3 days which changed her due date she was given 15 day supply on 12/17/12. Pt verbalizes understanding

## 2013-01-04 ENCOUNTER — Other Ambulatory Visit: Payer: Self-pay | Admitting: Internal Medicine

## 2013-01-04 DIAGNOSIS — D5720 Sickle-cell/Hb-C disease without crisis: Secondary | ICD-10-CM

## 2013-01-04 DIAGNOSIS — G8929 Other chronic pain: Secondary | ICD-10-CM

## 2013-01-04 MED ORDER — OXYCODONE HCL 10 MG PO TABS: 15.0000 mg | ORAL_TABLET | Freq: Four times a day (QID) | ORAL | Status: DC | PRN

## 2013-01-04 NOTE — Progress Notes (Signed)
Pt's prescription due for pain medications. Her prescription was initially ordered by NP Gwinda Passe. However, an error in the prescription rendered it not valid and is being re-written by me today for Oxycodone 10 mg 1.5 tabs every 6 hours as needed for pain. # 90 tabs.

## 2013-01-06 ENCOUNTER — Ambulatory Visit (INDEPENDENT_AMBULATORY_CARE_PROVIDER_SITE_OTHER): Payer: Medicare Other | Admitting: Internal Medicine

## 2013-01-06 ENCOUNTER — Encounter: Payer: Self-pay | Admitting: Internal Medicine

## 2013-01-06 VITALS — BP 120/84 | HR 88 | Temp 98.4°F | Resp 16 | Ht 67.5 in | Wt 233.0 lb

## 2013-01-06 DIAGNOSIS — D5720 Sickle-cell/Hb-C disease without crisis: Secondary | ICD-10-CM | POA: Diagnosis not present

## 2013-01-06 DIAGNOSIS — R141 Gas pain: Secondary | ICD-10-CM

## 2013-01-06 DIAGNOSIS — R197 Diarrhea, unspecified: Secondary | ICD-10-CM | POA: Diagnosis not present

## 2013-01-06 DIAGNOSIS — G894 Chronic pain syndrome: Secondary | ICD-10-CM | POA: Insufficient documentation

## 2013-01-06 DIAGNOSIS — R140 Abdominal distension (gaseous): Secondary | ICD-10-CM | POA: Insufficient documentation

## 2013-01-06 DIAGNOSIS — R112 Nausea with vomiting, unspecified: Secondary | ICD-10-CM | POA: Insufficient documentation

## 2013-01-06 DIAGNOSIS — R110 Nausea: Secondary | ICD-10-CM | POA: Diagnosis not present

## 2013-01-06 NOTE — Progress Notes (Signed)
  Subjective:    Patient ID: Cassie Montgomery, female    DOB: Jan 07, 1971, 42 y.o.   MRN: [id number]  Leg Pain   Diarrhea  Pertinent negatives include no arthralgias or myalgias.  : Patient who was recently hospitalized for acute vaso-occlusive episode with evidence of the clinic for followup visit. The patient states that with regard to her pain she's been doing well. However she states that she's been having problems with diarrhea and nausea for the last few days. She denies any abdominal pain. She also states that she's been having  bloating which has been occurring for more than a year. The nausea has not been sufficient to prevent her from eating.  She denies any fever, chills, dysuria, weakness. She states she feels well today.   Review of Systems  Constitutional: Negative.   HENT: Negative.   Eyes: Negative.   Respiratory: Negative.   Cardiovascular: Negative.   Gastrointestinal: Positive for nausea, diarrhea and abdominal distention.  Endocrine: Negative.   Genitourinary: Negative.   Musculoskeletal: Negative for myalgias and arthralgias.  Skin: Negative.   Allergic/Immunologic: Negative.   Neurological: Negative.   Hematological: Negative.   Psychiatric/Behavioral: Negative.        Objective:   Physical Exam  Constitutional: She is oriented to person, place, and time. She appears well-developed and well-nourished. No distress.  HENT:  Head: Normocephalic and atraumatic.  Eyes: Conjunctivae and EOM are normal. Pupils are equal, round, and reactive to light. No scleral icterus.  Neck: Normal range of motion. Neck supple. No JVD present. No thyromegaly present.  Cardiovascular: Normal rate and regular rhythm.  Exam reveals no gallop and no friction rub.   No murmur heard. Pulmonary/Chest: Effort normal and breath sounds normal. She has no wheezes. She has no rales.  Abdominal: Soft. Bowel sounds are normal. She exhibits distension. She exhibits no mass. There is no  tenderness. There is no rebound and no guarding.  Normal bowel sounds.  Musculoskeletal: Normal range of motion.  Lymphadenopathy:    She has no cervical adenopathy.  Neurological: She is alert and oriented to person, place, and time. No cranial nerve deficit.  Skin: Skin is warm and dry.  Psychiatric: She has a normal mood and affect. Her behavior is normal. Judgment and thought content normal.          Assessment & Plan:  1. Diarrhea: Is quite possible the patient does have irritable bowel syndrome she had constipation while hospitalized and was having diarrhea. However given the recent hospitalization and antibiotics, I cannot ignore the fact that the diarrhea may represent an infectious process. I will obtain a KUB to evaluate the patient's bowel gas pattern. I will also send stool for C. difficile. I asked the patient to refrain from gluten and dairy products for the next week and to reevaluate the distention and diarrhea.  2. Abdominal distention and diarrhea: This could be a sensitivity to foods including gluten and dairy. However the also represent irritable bowel syndrome. Patient will obtain a KUB and will refrain from foods containing gluten and dairy. If the patient does have resolution of her bloating with the discontinuation of this I will send gluten sensitivity panel to evaluate for gluten allergy.  3. Hb [place]: Patient states that she's had very little pain since being discharged from the hospital. She is using her medication a when necessary basis and continue her folic acid.  Labs: CBC with differential  RTC: 2 weeks

## 2013-01-13 ENCOUNTER — Telehealth: Payer: Self-pay | Admitting: Internal Medicine

## 2013-01-13 DIAGNOSIS — D5720 Sickle-cell/Hb-C disease without crisis: Secondary | ICD-10-CM

## 2013-01-13 DIAGNOSIS — G8929 Other chronic pain: Secondary | ICD-10-CM

## 2013-01-14 MED ORDER — OXYCODONE HCL 10 MG PO TABS: 15.0000 mg | ORAL_TABLET | Freq: Four times a day (QID) | ORAL | Status: DC | PRN

## 2013-01-14 NOTE — Telephone Encounter (Signed)
Pt called for refilled she was given 15 day supply on 01/04/13 due date is 01/18/13 this will be ready for pick up

## 2013-01-19 NOTE — Telephone Encounter (Signed)
Close encounter 

## 2013-01-25 ENCOUNTER — Encounter (HOSPITAL_COMMUNITY): Payer: Self-pay | Admitting: *Deleted

## 2013-01-25 ENCOUNTER — Emergency Department (HOSPITAL_COMMUNITY): Payer: Medicare Other

## 2013-01-25 ENCOUNTER — Encounter: Payer: Self-pay | Admitting: Primary Care

## 2013-01-25 ENCOUNTER — Other Ambulatory Visit: Payer: Self-pay

## 2013-01-25 ENCOUNTER — Emergency Department (HOSPITAL_COMMUNITY)
Admission: EM | Admit: 2013-01-25 | Discharge: 2013-01-25 | Disposition: A | Discharge status: Home / Self Care | Payer: Medicare Other | Attending: Emergency Medicine | Admitting: Emergency Medicine

## 2013-01-25 DIAGNOSIS — D571 Sickle-cell disease without crisis: Secondary | ICD-10-CM | POA: Insufficient documentation

## 2013-01-25 DIAGNOSIS — J40 Bronchitis, not specified as acute or chronic: Secondary | ICD-10-CM | POA: Diagnosis not present

## 2013-01-25 DIAGNOSIS — J209 Acute bronchitis, unspecified: Secondary | ICD-10-CM | POA: Diagnosis not present

## 2013-01-25 DIAGNOSIS — Z79899 Other long term (current) drug therapy: Secondary | ICD-10-CM | POA: Insufficient documentation

## 2013-01-25 DIAGNOSIS — Z8742 Personal history of other diseases of the female genital tract: Secondary | ICD-10-CM | POA: Insufficient documentation

## 2013-01-25 DIAGNOSIS — G43109 Migraine with aura, not intractable, without status migrainosus: Secondary | ICD-10-CM | POA: Insufficient documentation

## 2013-01-25 DIAGNOSIS — R0602 Shortness of breath: Secondary | ICD-10-CM | POA: Diagnosis not present

## 2013-01-25 DIAGNOSIS — R079 Chest pain, unspecified: Secondary | ICD-10-CM | POA: Diagnosis not present

## 2013-01-25 MED ORDER — PREDNISONE 10 MG PO TABS: 20.0000 mg | ORAL_TABLET | Freq: Every day | ORAL | Status: DC

## 2013-01-25 MED ORDER — HYDROCODONE-HOMATROPINE 5-1.5 MG/5ML PO SYRP: 5.0000 mL | ORAL_SOLUTION | Freq: Every evening | ORAL | Status: DC | PRN

## 2013-01-25 MED ORDER — AZITHROMYCIN 250 MG PO TABS: 250.0000 mg | ORAL_TABLET | Freq: Every day | ORAL | Status: DC

## 2013-01-25 NOTE — Progress Notes (Unsigned)
Patient ID: Cassie Montgomery, female   DOB: 08-06-1970, 42 y.o.   MRN: [id number] Cassie Montgomery called today with symptoms which has been having for the last 2-3 days coughing,-non productive, headache located at the base of the skull, several episodes of nausea and chest pain which is non radiating hurts more with expiration. She also has shortness of breath esp with exertion. She denies dizziness , visual changes , diarrhea, fever and chills Last month hospitalized with Bronchitis. Advises to go to ED for evaluation.

## 2013-01-25 NOTE — ED Notes (Signed)
Pt reports chest pain when coughing for several days, 5/10. Reports some SOB when coughing.

## 2013-01-25 NOTE — ED Provider Notes (Signed)
CSN: [id number]     Arrival date & time 01/25/13  1520 History   First MD Initiated Contact with Patient 01/25/13 1618     Chief Complaint  Patient presents with  . chest pain when coughing    (Consider location/radiation/quality/duration/timing/severity/associated sxs/prior Treatment) HPI Comments: Patient presents to the ER for evaluation of cough. Patient reports that she has had a progressively worsening cough for several days. Patient reports a burning pain in the center and upper chest when she coughs. She does not have continuous chest pain, however. Patient reports the cough is mostly nonproductive. It seems to worsen at night. Patient has been feeling short of breath. She reports that she used her inhaler earlier today and it did help, but she has not been regularly using the inhaler. She reports that the coughing episodes and spells. When she has a coughing spell it is continuous and has resulted in posttussive emesis several times.   Past Medical History  Diagnosis Date  . PMDD (premenstrual dysphoric disorder)   . Sickle cell anemia   . Sickle cell disease without crisis 07/15/2011  . Migraine with aura 07/15/2011   Past Surgical History  Procedure Laterality Date  . Cesarean section     Family History  Problem Relation Age of Onset  . Hypertension Mother    History  Substance Use Topics  . Smoking status: Never Smoker   . Smokeless tobacco: Not on file  . Alcohol Use: No   OB History   Grav Para Term Preterm Abortions TAB SAB Ect Mult Living                 Review of Systems  Respiratory: Positive for cough, chest tightness and shortness of breath.   All other systems reviewed and are negative.    Allergies  Sulfa antibiotics  Home Medications   Current Outpatient Rx  Name  Route  Sig  Dispense  Refill  . citalopram (CELEXA) 20 MG tablet   Oral   Take 1 tablet (20 mg total) by mouth daily.   30 tablet   3   . folic acid (FOLVITE) 1 MG tablet   Oral  Take 1 mg by mouth daily.         Marland Kitchen ibuprofen (ADVIL,MOTRIN) 800 MG tablet   Oral   Take 1 tablet (800 mg total) by mouth every 8 (eight) hours as needed for pain.   90 tablet   1   . levalbuterol (XOPENEX HFA) 45 MCG/ACT inhaler   Inhalation   Inhale 2 puffs into the lungs 2 (two) times daily as needed for wheezing or shortness of breath.          Marland Kitchen LORazepam (ATIVAN) 1 MG tablet   Oral   Take 1 tablet (1 mg total) by mouth every 6 (six) hours as needed for anxiety.   120 tablet   0   . Oxycodone HCl 10 MG TABS   Oral   Take 1.5 tablets (15 mg total) by mouth every 6 (six) hours as needed (for pain).   90 tablet   0    BP 148/92  Pulse 89  Temp(Src) 98.9 F (37.2 C) (Oral)  Resp 16  SpO2 100%  LMP 01/25/2013 Physical Exam  Constitutional: She is oriented to person, place, and time. She appears well-developed and well-nourished. No distress.  HENT:  Head: Normocephalic and atraumatic.  Right Ear: Hearing normal.  Left Ear: Hearing normal.  Nose: Nose normal.  Mouth/Throat: Oropharynx is clear  and moist and mucous membranes are normal.  Eyes: Conjunctivae and EOM are normal. Pupils are equal, round, and reactive to light.  Neck: Normal range of motion. Neck supple.  Cardiovascular: Regular rhythm, S1 normal and S2 normal.  Exam reveals no gallop and no friction rub.   No murmur heard. Pulmonary/Chest: Effort normal and breath sounds normal. No respiratory distress. She exhibits no tenderness.  Abdominal: Soft. Normal appearance and bowel sounds are normal. There is no hepatosplenomegaly. There is no tenderness. There is no rebound, no guarding, no tenderness at McBurney's point and negative Murphy's sign. No hernia.  Musculoskeletal: Normal range of motion.  Neurological: She is alert and oriented to person, place, and time. She has normal strength. No cranial nerve deficit or sensory deficit. Coordination normal. GCS eye subscore is 4. GCS verbal subscore is 5. GCS  motor subscore is 6.  Skin: Skin is warm, dry and intact. No rash noted. No cyanosis.  Psychiatric: She has a normal mood and affect. Her speech is normal and behavior is normal. Thought content normal.    ED Course  Procedures (including critical care time)   Date: 01/25/2013  Rate: 104  Rhythm: sinus tachycardia  QRS Axis: normal  Intervals: normal  ST/T Wave abnormalities: normal  Conduction Disutrbances:none  Narrative Interpretation:   Old EKG Reviewed: none available   Labs Review Labs Reviewed - No data to display Imaging Review Dg Chest 2 View  01/25/2013   *RADIOLOGY REPORT*  Clinical Data: Chest pain  CHEST - 2 VIEW  Comparison: 12/26/2012  Findings: Relatively low lung volumes. Lungs clear.  Heart size and pulmonary vascularity normal.  No effusion.  Visualized bones unremarkable.  IMPRESSION: No acute disease   Original Report Authenticated By: D. Andria Rhein, MD    MDM  Diagnosis: Bronchitis  Patient presents to the ER for evaluation of progressively worsening cough and shortness of breath. Patient reports improvement of the symptoms with inhalers, but has not been aggressively treating herself at home. The patient does have a history of sickle cell anemia, but does not appear to be experiencing a sickle cell crisis work he chest. Her pain is only present when coughing, not continuously. EKG was unremarkable. Chest x-ray was clear. I do not see any benefit the blood work at this time. Patient will be treated for acute bronchitis with continued bronchodilator therapy, corticosteroid therapy and symptomatic treatment.    Gilda Crease, MD 01/25/13 [phone]

## 2013-01-26 ENCOUNTER — Telehealth: Payer: Self-pay | Admitting: *Deleted

## 2013-01-27 ENCOUNTER — Telehealth: Payer: Self-pay | Admitting: Internal Medicine

## 2013-01-27 DIAGNOSIS — D5720 Sickle-cell/Hb-C disease without crisis: Secondary | ICD-10-CM

## 2013-01-27 DIAGNOSIS — G8929 Other chronic pain: Secondary | ICD-10-CM

## 2013-01-27 MED ORDER — OXYCODONE HCL 10 MG PO TABS: 15.0000 mg | ORAL_TABLET | Freq: Four times a day (QID) | ORAL | Status: DC | PRN

## 2013-01-27 NOTE — Telephone Encounter (Signed)
Refilled oxycodone 10mg  1 1/2 Q 6hrs # 90 ready for pick up Monday. Pt question refilling her hycodan for cough advised did not see or tx advised to get an OTC ck with pharmacy and use that Qhs to rest if not feeling any better schedule appt to be seen

## 2013-02-10 ENCOUNTER — Telehealth: Payer: Self-pay | Admitting: Internal Medicine

## 2013-02-10 DIAGNOSIS — D5720 Sickle-cell/Hb-C disease without crisis: Secondary | ICD-10-CM

## 2013-02-10 DIAGNOSIS — G8929 Other chronic pain: Secondary | ICD-10-CM

## 2013-02-10 DIAGNOSIS — F411 Generalized anxiety disorder: Secondary | ICD-10-CM

## 2013-02-10 MED ORDER — LORAZEPAM 1 MG PO TABS: 1.0000 mg | ORAL_TABLET | Freq: Four times a day (QID) | ORAL | Status: DC | PRN

## 2013-02-10 MED ORDER — OXYCODONE HCL 10 MG PO TABS: 15.0000 mg | ORAL_TABLET | Freq: Four times a day (QID) | ORAL | Status: DC | PRN

## 2013-02-10 NOTE — Telephone Encounter (Signed)
Refill avilable for pick up for Oxycodone 10mg   1 1/2 Q hrs prn # 90 a 15 day supply

## 2013-02-15 ENCOUNTER — Ambulatory Visit (INDEPENDENT_AMBULATORY_CARE_PROVIDER_SITE_OTHER): Payer: Medicare Other | Admitting: Internal Medicine

## 2013-02-15 ENCOUNTER — Encounter: Payer: Self-pay | Admitting: Internal Medicine

## 2013-02-15 VITALS — BP 104/66 | HR 75 | Temp 99.0°F | Resp 18 | Ht 66.0 in | Wt 231.0 lb

## 2013-02-15 DIAGNOSIS — Z23 Encounter for immunization: Secondary | ICD-10-CM | POA: Diagnosis not present

## 2013-02-15 DIAGNOSIS — H05223 Edema of bilateral orbit: Secondary | ICD-10-CM

## 2013-02-15 DIAGNOSIS — D5720 Sickle-cell/Hb-C disease without crisis: Secondary | ICD-10-CM | POA: Diagnosis not present

## 2013-02-15 DIAGNOSIS — H05229 Edema of unspecified orbit: Secondary | ICD-10-CM | POA: Insufficient documentation

## 2013-02-15 NOTE — Progress Notes (Signed)
  Subjective:    Patient ID: Cassie Montgomery, female    DOB: 04/16/1971, 42 y.o.   MRN: [id number]  HPI: Pt states that she has increased pain in the peri-menstrual and has used her analgesic medication with increased frequency on those days. She still has not needed her medications prematurely. I have counseled her against increased used use of analgesics and it's risks up to and including death. Pt verbalized understanding.  Also discussed use of contraceptives to help to decrease blood loss during menstruation and patient was not agreeable as she reports that she has had lots of problems in the past that she attributes to the use of contraceptives.  Pt was noted to have peri-orbital swelling and reports that she also noticed this with in the last week. She also reports watery eyes and occasional rhinorrhea.    Review of Systems  Constitutional: Positive for fatigue.  HENT: Negative.   Eyes: Negative.   Respiratory: Negative.   Cardiovascular: Negative.   Gastrointestinal: Negative.   Endocrine: Negative.   Genitourinary: Negative.   Musculoskeletal: Positive for myalgias and arthralgias.  Skin: Negative.   Allergic/Immunologic: Negative.   Neurological: Negative.   Hematological: Negative.   Psychiatric/Behavioral: Negative.        Objective:   Physical Exam  Constitutional: She is oriented to person, place, and time. She appears well-developed and well-nourished.  HENT:  Head: Normocephalic and atraumatic.  Eyes: Conjunctivae and EOM are normal. Pupils are equal, round, and reactive to light. No scleral icterus.  Peri-orbital edema  Neck: Normal range of motion. Neck supple. No JVD present. No thyromegaly present.  Cardiovascular: Normal rate and regular rhythm.  Exam reveals no gallop and no friction rub.   No murmur heard. Pulmonary/Chest: Effort normal and breath sounds normal. She has no wheezes. She has no rales.  Abdominal: Soft. Bowel sounds are normal. She exhibits  no distension and no mass. There is no tenderness.  Musculoskeletal: Normal range of motion.  Neurological: She is alert and oriented to person, place, and time. No cranial nerve deficit.  Skin: Skin is warm and dry.  Psychiatric: She has a normal mood and affect. Her behavior is normal. Judgment and thought content normal.          Assessment & Plan:  1. Peri-orbital edema: May be related to allergies.However, in light of SCD, I cannot exclude th e possibility of renal disease. Will check U/A for proteinuria and TSH for hypothyroidism. Recommended OTC antihistamines on a PRN basis.   2. Hb [place] with pain: Pt with increased pain during peri-menstrual period. Pt will increase short acting medication during the period of increased pain. We will also check Hb at the time of menstruation and assess for need for transfusion. I have discussed contraception with patient however she does not want to pursue contraception as she states that she had lots of problems in the past. Pt has not had ever had a vision examination. Will refer for visual examination.  3. Chronic Pain: Pt to continue on current prescription of pain medication. Prescription for Narcan given.  3. Immunization: Influenza vaccine given today.  Labs: Urinalysis, TSH  RTC: PRN

## 2013-02-17 ENCOUNTER — Other Ambulatory Visit: Payer: Medicare Other | Admitting: *Deleted

## 2013-02-17 DIAGNOSIS — H05223 Edema of bilateral orbit: Secondary | ICD-10-CM

## 2013-02-17 DIAGNOSIS — D5720 Sickle-cell/Hb-C disease without crisis: Secondary | ICD-10-CM | POA: Diagnosis not present

## 2013-02-17 DIAGNOSIS — H05229 Edema of unspecified orbit: Secondary | ICD-10-CM | POA: Diagnosis not present

## 2013-02-17 LAB — CBC WITH DIFFERENTIAL/PLATELET
Basophils Absolute: 0 10*3/uL (ref 0.0–0.1)
Basophils Relative: 0 % (ref 0–1)
HCT: 29.9 % — ABNORMAL LOW (ref 36.0–46.0)
Lymphocytes Relative: 28 % (ref 12–46)
MCHC: 33.8 g/dL (ref 30.0–36.0)
Monocytes Absolute: 0.4 10*3/uL (ref 0.1–1.0)
Neutro Abs: 5.1 10*3/uL (ref 1.7–7.7)
Neutrophils Relative %: 65 % (ref 43–77)
Platelets: 164 10*3/uL (ref 150–400)
RBC: 4 MIL/uL (ref 3.87–5.11)
RDW: 19.9 % — ABNORMAL HIGH (ref 11.5–15.5)
WBC: 8 10*3/uL (ref 4.0–10.5)

## 2013-02-17 LAB — TSH: TSH: 1.026 u[IU]/mL (ref 0.350–4.500)

## 2013-02-18 LAB — URINALYSIS, COMPLETE
Bacteria, UA: NONE SEEN
Casts: NONE SEEN
Glucose, UA: NEGATIVE mg/dL
Ketones, ur: NEGATIVE mg/dL
pH: 5.5 (ref 5.0–8.0)

## 2013-02-19 ENCOUNTER — Other Ambulatory Visit: Payer: Medicare Other

## 2013-02-19 NOTE — Telephone Encounter (Signed)
Questions about cough meds received in ER

## 2013-02-24 ENCOUNTER — Telehealth: Payer: Self-pay | Admitting: Internal Medicine

## 2013-02-24 DIAGNOSIS — G8929 Other chronic pain: Secondary | ICD-10-CM

## 2013-02-24 DIAGNOSIS — D5720 Sickle-cell/Hb-C disease without crisis: Secondary | ICD-10-CM

## 2013-02-25 MED ORDER — IBUPROFEN 800 MG PO TABS: 800.0000 mg | ORAL_TABLET | Freq: Three times a day (TID) | ORAL | Status: DC | PRN

## 2013-02-25 MED ORDER — OXYCODONE HCL 10 MG PO TABS: 15.0000 mg | ORAL_TABLET | Freq: Four times a day (QID) | ORAL | Status: DC | PRN

## 2013-02-25 NOTE — Telephone Encounter (Signed)
Refill available for pick up for Oxycodone 10mg  1 1/2 Q 6 hrs prn # 90 and ibuprofen . Last filled on 02/10/13

## 2013-03-01 ENCOUNTER — Encounter: Payer: Self-pay | Admitting: *Deleted

## 2013-03-05 ENCOUNTER — Telehealth (HOSPITAL_COMMUNITY): Payer: Self-pay | Admitting: Internal Medicine

## 2013-03-08 ENCOUNTER — Telehealth: Payer: Self-pay | Admitting: Internal Medicine

## 2013-03-08 ENCOUNTER — Ambulatory Visit: Payer: Medicare Other | Admitting: Primary Care

## 2013-03-10 ENCOUNTER — Telehealth: Payer: Self-pay | Admitting: Internal Medicine

## 2013-03-10 DIAGNOSIS — G8929 Other chronic pain: Secondary | ICD-10-CM

## 2013-03-10 DIAGNOSIS — F411 Generalized anxiety disorder: Secondary | ICD-10-CM

## 2013-03-10 DIAGNOSIS — D5720 Sickle-cell/Hb-C disease without crisis: Secondary | ICD-10-CM

## 2013-03-10 MED ORDER — OXYCODONE HCL 10 MG PO TABS: 15.0000 mg | ORAL_TABLET | Freq: Four times a day (QID) | ORAL | Status: DC | PRN

## 2013-03-10 MED ORDER — LORAZEPAM 1 MG PO TABS: 1.0000 mg | ORAL_TABLET | Freq: Four times a day (QID) | ORAL | Status: DC | PRN

## 2013-03-10 NOTE — Telephone Encounter (Signed)
Refill ready for pick up on on after today for Oxycodone 10mg  Q 6 hrs prn # 90 and refilled lorazepam 1 Q 6hrs prn # 90 with 1 refill

## 2013-03-19 NOTE — Telephone Encounter (Signed)
Close encounter 

## 2013-03-24 ENCOUNTER — Other Ambulatory Visit: Payer: Self-pay | Admitting: Internal Medicine

## 2013-03-24 ENCOUNTER — Telehealth: Payer: Self-pay | Admitting: Internal Medicine

## 2013-03-24 DIAGNOSIS — G8929 Other chronic pain: Secondary | ICD-10-CM

## 2013-03-24 DIAGNOSIS — D5720 Sickle-cell/Hb-C disease without crisis: Secondary | ICD-10-CM

## 2013-03-24 MED ORDER — OXYCODONE HCL 10 MG PO TABS: 15.0000 mg | ORAL_TABLET | Freq: Four times a day (QID) | ORAL | Status: DC | PRN

## 2013-03-24 NOTE — Progress Notes (Signed)
Prescription refilled for Oxycodone 10 mg #90 (15 day supply) to be taken 15 mg q 6 hours PRN.

## 2013-04-07 ENCOUNTER — Telehealth: Payer: Self-pay | Admitting: *Deleted

## 2013-04-07 ENCOUNTER — Other Ambulatory Visit: Payer: Self-pay | Admitting: Internal Medicine

## 2013-04-07 DIAGNOSIS — G8929 Other chronic pain: Secondary | ICD-10-CM

## 2013-04-07 DIAGNOSIS — D5720 Sickle-cell/Hb-C disease without crisis: Secondary | ICD-10-CM

## 2013-04-07 MED ORDER — OXYCODONE HCL 10 MG PO TABS: 15.0000 mg | ORAL_TABLET | Freq: Four times a day (QID) | ORAL | Status: DC | PRN

## 2013-04-07 NOTE — Telephone Encounter (Signed)
Rx refill for Oxycodone 10 mg #90 last Rx 03/24/13

## 2013-04-07 NOTE — Progress Notes (Signed)
15 day supply of Oxycodone 10 mg prescribed (#90 pills. Needs prescription drug monitoring screen.

## 2013-04-13 ENCOUNTER — Other Ambulatory Visit: Payer: Self-pay | Admitting: *Deleted

## 2013-04-13 ENCOUNTER — Other Ambulatory Visit (HOSPITAL_COMMUNITY): Payer: Self-pay | Admitting: Hematology

## 2013-04-13 DIAGNOSIS — G894 Chronic pain syndrome: Secondary | ICD-10-CM

## 2013-04-14 LAB — PRESCRIPTION ABUSE MONITORING 17P, URINE
Barbiturate Screen, Urine: NEGATIVE ng/mL
Buprenorphine, Urine: NEGATIVE ng/mL
Creatinine, Urine: 245.62 mg/dL (ref >20.0)
Fentanyl, Ur: NEGATIVE ng/mL
Meperidine, Ur: NEGATIVE ng/mL
Methadone Screen, Urine: NEGATIVE ng/mL
Propoxyphene: NEGATIVE ng/mL

## 2013-04-15 ENCOUNTER — Telehealth: Payer: Self-pay | Admitting: Internal Medicine

## 2013-04-15 ENCOUNTER — Telehealth (HOSPITAL_COMMUNITY): Payer: Self-pay

## 2013-04-15 ENCOUNTER — Non-Acute Institutional Stay (HOSPITAL_COMMUNITY)
Admission: AD | Admit: 2013-04-15 | Discharge: 2013-04-15 | Disposition: A | Discharge status: Home / Self Care | Payer: Medicare Other | Source: Ambulatory Visit | Attending: Internal Medicine | Admitting: Internal Medicine

## 2013-04-15 ENCOUNTER — Encounter: Payer: Self-pay | Admitting: Internal Medicine

## 2013-04-15 ENCOUNTER — Ambulatory Visit (INDEPENDENT_AMBULATORY_CARE_PROVIDER_SITE_OTHER): Payer: Medicare Other | Admitting: Internal Medicine

## 2013-04-15 VITALS — BP 139/96 | HR 78 | Temp 98.2°F | Resp 18 | Ht 67.0 in | Wt 235.0 lb

## 2013-04-15 DIAGNOSIS — N943 Premenstrual tension syndrome: Secondary | ICD-10-CM

## 2013-04-15 DIAGNOSIS — N946 Dysmenorrhea, unspecified: Secondary | ICD-10-CM

## 2013-04-15 DIAGNOSIS — D57219 Sickle-cell/Hb-C disease with crisis, unspecified: Secondary | ICD-10-CM | POA: Diagnosis not present

## 2013-04-15 DIAGNOSIS — Z3042 Encounter for surveillance of injectable contraceptive: Secondary | ICD-10-CM

## 2013-04-15 DIAGNOSIS — G4700 Insomnia, unspecified: Secondary | ICD-10-CM

## 2013-04-15 DIAGNOSIS — Z3049 Encounter for surveillance of other contraceptives: Secondary | ICD-10-CM

## 2013-04-15 DIAGNOSIS — K219 Gastro-esophageal reflux disease without esophagitis: Secondary | ICD-10-CM

## 2013-04-15 DIAGNOSIS — F3281 Premenstrual dysphoric disorder: Secondary | ICD-10-CM

## 2013-04-15 LAB — CBC WITH DIFFERENTIAL/PLATELET
Eosinophils Absolute: 0.2 10*3/uL (ref 0.0–0.7)
Eosinophils Relative: 3 % (ref 0–5)
HCT: 28.5 % — ABNORMAL LOW (ref 36.0–46.0)
Lymphocytes Relative: 24 % (ref 12–46)
Lymphs Abs: 1.7 10*3/uL (ref 0.7–4.0)
MCHC: 35.1 g/dL (ref 30.0–36.0)
MCV: 72.9 fL — ABNORMAL LOW (ref 78.0–100.0)
Monocytes Absolute: 0.5 10*3/uL (ref 0.1–1.0)
Neutro Abs: 4.8 10*3/uL (ref 1.7–7.7)
Platelets: 148 10*3/uL — ABNORMAL LOW (ref 150–400)
RBC: 3.91 MIL/uL (ref 3.87–5.11)
WBC: 7.1 10*3/uL (ref 4.0–10.5)

## 2013-04-15 LAB — PREGNANCY, URINE: Preg Test, Ur: NEGATIVE

## 2013-04-15 MED ORDER — ZOLPIDEM TARTRATE 5 MG PO TABS: 5.0000 mg | ORAL_TABLET | Freq: Every evening | ORAL | Status: DC | PRN

## 2013-04-15 MED ORDER — KETOROLAC TROMETHAMINE 30 MG/ML IJ SOLN
30.0000 mg | Freq: Once | INTRAMUSCULAR | Status: AC
  Administered 2013-04-15: 30 mg via INTRAVENOUS
  Filled 2013-04-15: qty 1

## 2013-04-15 MED ORDER — OMEPRAZOLE 40 MG PO CPDR: 40.0000 mg | DELAYED_RELEASE_CAPSULE | Freq: Every day | ORAL | Status: DC

## 2013-04-15 MED ORDER — SERTRALINE HCL 50 MG PO TABS: 50.0000 mg | ORAL_TABLET | Freq: Every day | ORAL | Status: DC

## 2013-04-15 NOTE — Progress Notes (Signed)
Patient ID: Cassie Montgomery, female   DOB: 1971-01-07, 42 y.o.   MRN: [id number] Pt had labs drawn; IV push of Toradol through running fluid of NS and gave clean catch urine sample; pt alert and oriented; no complications noted; IV removed with no complications; Discharge instructions given and signed

## 2013-04-15 NOTE — Progress Notes (Signed)
Pt left stating pain had not subsided yet.  Pt in no acute distress, talking on cell phone, ambulated self to lobby.  Pt states needed to leave to pick up child. Barnett Hatter P

## 2013-04-15 NOTE — Telephone Encounter (Signed)
This CM received a voicemail message from Ms. Kantor regarding completing paperwork. This CM left message that the MD needs to review, upon completion a call will be made to when she can pick the paperwork up. This CM will continue to monitor.   Karoline Caldwell, RN, BSN, [place]    [phone]

## 2013-04-16 ENCOUNTER — Telehealth: Payer: Self-pay | Admitting: Internal Medicine

## 2013-04-16 ENCOUNTER — Telehealth: Payer: Self-pay | Admitting: *Deleted

## 2013-04-16 NOTE — Telephone Encounter (Signed)
Called [facility] for pap smear results left message to call back. Also asked Tamela Oddi to check to see if any results were in no results found.

## 2013-04-16 NOTE — Telephone Encounter (Signed)
Pt called for results of pregnancy test. She was told test is negative. Pt to follow-up for PAP and pelvic examination and evaluation of peri-menopausal state. Pt to make appointment for next week.

## 2013-04-19 ENCOUNTER — Telehealth: Payer: Self-pay | Admitting: *Deleted

## 2013-04-19 LAB — BENZODIAZEPINES (GC/LC/MS), URINE
Clonazepam metabolite (GC/LC/MS), ur confirm: NEGATIVE ng/mL
Diazepam (GC/LC/MS), ur confirm: NEGATIVE ng/mL
Estazolam (GC/LC/MS), ur confirm: NEGATIVE ng/mL
Flunitrazepam metabolite (GC/LC/MS), ur confirm: NEGATIVE ng/mL
Lorazepam (GC/LC/MS), ur confirm: 872 ng/mL
Midazolam (GC/LC/MS), ur confirm: NEGATIVE ng/mL
Nordiazepam (GC/LC/MS), ur confirm: NEGATIVE ng/mL
Temazepam (GC/LC/MS), ur confirm: NEGATIVE ng/mL
Triazolam metabolite (GC/LC/MS), ur confirm: NEGATIVE ng/mL

## 2013-04-19 LAB — AMPHETAMINES (GC/LC/MS), URINE
Amphetamine GC/MS Conf: 932 ng/mL
MDA GC/MS confirm: NEGATIVE ng/mL
MDEA GC/MS Conf: NEGATIVE ng/mL
Methamphetamine Quant, Ur: NEGATIVE ng/mL

## 2013-04-19 LAB — OPIATES/OPIOIDS (LC/MS-MS)
Codeine Urine: NEGATIVE ng/mL
Hydrocodone: NEGATIVE ng/mL
Hydromorphone: NEGATIVE ng/mL
Norhydrocodone, Ur: NEGATIVE ng/mL
Noroxycodone, Ur: >10000 ng/mL
Oxycodone, ur: 7305 ng/mL

## 2013-04-19 LAB — OXYCODONE, URINE (LC/MS-MS): Oxymorphone: 876 ng/mL

## 2013-04-19 NOTE — Telephone Encounter (Signed)
Per [facility] no pap smear results found

## 2013-04-19 NOTE — Telephone Encounter (Signed)
Close encounter 

## 2013-04-21 ENCOUNTER — Encounter (HOSPITAL_COMMUNITY): Payer: Self-pay | Admitting: Hematology

## 2013-04-21 ENCOUNTER — Ambulatory Visit: Payer: Medicare Other | Admitting: Internal Medicine

## 2013-04-21 ENCOUNTER — Telehealth (HOSPITAL_COMMUNITY): Payer: Self-pay | Admitting: *Deleted

## 2013-04-21 ENCOUNTER — Non-Acute Institutional Stay (HOSPITAL_COMMUNITY)
Admission: AD | Admit: 2013-04-21 | Discharge: 2013-04-21 | Disposition: A | Discharge status: Home / Self Care | Payer: Medicare Other | Attending: Internal Medicine | Admitting: Internal Medicine

## 2013-04-21 DIAGNOSIS — D57219 Sickle-cell/Hb-C disease with crisis, unspecified: Secondary | ICD-10-CM

## 2013-04-21 DIAGNOSIS — E876 Hypokalemia: Secondary | ICD-10-CM | POA: Diagnosis not present

## 2013-04-21 DIAGNOSIS — R109 Unspecified abdominal pain: Secondary | ICD-10-CM | POA: Diagnosis not present

## 2013-04-21 DIAGNOSIS — G8929 Other chronic pain: Secondary | ICD-10-CM

## 2013-04-21 DIAGNOSIS — M79609 Pain in unspecified limb: Secondary | ICD-10-CM | POA: Diagnosis not present

## 2013-04-21 DIAGNOSIS — D5720 Sickle-cell/Hb-C disease without crisis: Secondary | ICD-10-CM

## 2013-04-21 DIAGNOSIS — D5700 Hb-SS disease with crisis, unspecified: Secondary | ICD-10-CM | POA: Insufficient documentation

## 2013-04-21 LAB — URINALYSIS, ROUTINE W REFLEX MICROSCOPIC
Ketones, ur: NEGATIVE mg/dL
Nitrite: NEGATIVE
Specific Gravity, Urine: 1.017 (ref 1.005–1.030)
Urobilinogen, UA: 0.2 mg/dL (ref 0.0–1.0)
pH: 6 (ref 5.0–8.0)

## 2013-04-21 LAB — CBC WITH DIFFERENTIAL/PLATELET
Basophils Absolute: 0 10*3/uL (ref 0.0–0.1)
Basophils Relative: 0 % (ref 0–1)
Eosinophils Absolute: 0.1 10*3/uL (ref 0.0–0.7)
Eosinophils Relative: 1 % (ref 0–5)
HCT: 31.7 % — ABNORMAL LOW (ref 36.0–46.0)
Lymphocytes Relative: 26 % (ref 12–46)
MCH: 25.4 pg — ABNORMAL LOW (ref 26.0–34.0)
MCHC: 34.7 g/dL (ref 30.0–36.0)
MCV: 73.2 fL — ABNORMAL LOW (ref 78.0–100.0)
Monocytes Absolute: 0.5 10*3/uL (ref 0.1–1.0)
Platelets: 160 10*3/uL (ref 150–400)
RDW: 17.1 % — ABNORMAL HIGH (ref 11.5–15.5)
WBC: 9.1 10*3/uL (ref 4.0–10.5)

## 2013-04-21 LAB — COMPREHENSIVE METABOLIC PANEL
ALT: 10 U/L (ref 0–35)
AST: 14 U/L (ref 0–37)
CO2: 28 mEq/L (ref 19–32)
Calcium: 9.5 mg/dL (ref 8.4–10.5)
Chloride: 97 mEq/L (ref 96–112)
Creatinine, Ser: 0.85 mg/dL (ref 0.50–1.10)
GFR calc non Af Amer: 83 mL/min — ABNORMAL LOW (ref >90)
Sodium: 137 mEq/L (ref 135–145)
Total Bilirubin: 3.7 mg/dL — ABNORMAL HIGH (ref 0.3–1.2)
Total Protein: 7.9 g/dL (ref 6.0–8.3)

## 2013-04-21 LAB — URINE MICROSCOPIC-ADD ON

## 2013-04-21 LAB — RETICULOCYTES
RBC.: 4.33 MIL/uL (ref 3.87–5.11)
Retic Count, Absolute: 233.8 10*3/uL — ABNORMAL HIGH (ref 19.0–186.0)

## 2013-04-21 MED ORDER — HYDROMORPHONE 0.3 MG/ML IV SOLN
INTRAVENOUS | Status: DC
  Administered 2013-04-21: 3.32 mg via INTRAVENOUS
  Administered 2013-04-21 (×2): via INTRAVENOUS
  Administered 2013-04-21: 4.99 mg via INTRAVENOUS
  Filled 2013-04-21 (×2): qty 25

## 2013-04-21 MED ORDER — OXYCODONE HCL 10 MG PO TABS: 15.0000 mg | ORAL_TABLET | Freq: Four times a day (QID) | ORAL | Status: DC | PRN

## 2013-04-21 MED ORDER — POTASSIUM CHLORIDE CRYS ER 20 MEQ PO TBCR
40.0000 meq | EXTENDED_RELEASE_TABLET | ORAL | Status: DC
  Administered 2013-04-21: 40 meq via ORAL
  Filled 2013-04-21 (×2): qty 2

## 2013-04-21 MED ORDER — NALOXONE HCL 0.4 MG/ML IJ SOLN: 0.4000 mg | INTRAMUSCULAR | Status: DC | PRN

## 2013-04-21 MED ORDER — ONDANSETRON HCL 4 MG/2ML IJ SOLN: 4.0000 mg | Freq: Four times a day (QID) | INTRAMUSCULAR | Status: DC | PRN

## 2013-04-21 MED ORDER — HYDROMORPHONE HCL PF 2 MG/ML IJ SOLN
3.2000 mg | Freq: Once | INTRAMUSCULAR | Status: AC
  Administered 2013-04-21: 3.2 mg via INTRAVENOUS
  Filled 2013-04-21: qty 2

## 2013-04-21 MED ORDER — DIPHENHYDRAMINE HCL 25 MG PO CAPS
25.0000 mg | ORAL_CAPSULE | ORAL | Status: DC | PRN
  Administered 2013-04-21: 50 mg via ORAL
  Filled 2013-04-21: qty 2

## 2013-04-21 MED ORDER — SERTRALINE HCL 50 MG PO TABS
50.0000 mg | ORAL_TABLET | Freq: Every day | ORAL | Status: DC
  Filled 2013-04-21: qty 1

## 2013-04-21 MED ORDER — HYDROMORPHONE HCL PF 2 MG/ML IJ SOLN
2.6000 mg | Freq: Once | INTRAMUSCULAR | Status: AC
  Administered 2013-04-21: 2.6 mg via INTRAVENOUS
  Filled 2013-04-21: qty 2

## 2013-04-21 MED ORDER — LORAZEPAM 1 MG PO TABS: 1.0000 mg | ORAL_TABLET | Freq: Four times a day (QID) | ORAL | Status: DC | PRN

## 2013-04-21 MED ORDER — SODIUM CHLORIDE 0.9 % IJ SOLN: 9.0000 mL | INTRAMUSCULAR | Status: DC | PRN

## 2013-04-21 MED ORDER — SENNOSIDES-DOCUSATE SODIUM 8.6-50 MG PO TABS: 1.0000 | ORAL_TABLET | Freq: Two times a day (BID) | ORAL | Status: DC

## 2013-04-21 MED ORDER — HYDROMORPHONE HCL PF 2 MG/ML IJ SOLN
3.8000 mg | Freq: Once | INTRAMUSCULAR | Status: AC
  Administered 2013-04-21: 3.8 mg via INTRAVENOUS
  Filled 2013-04-21: qty 2

## 2013-04-21 MED ORDER — KETOROLAC TROMETHAMINE 30 MG/ML IJ SOLN
30.0000 mg | Freq: Once | INTRAMUSCULAR | Status: AC
  Administered 2013-04-21: 30 mg via INTRAVENOUS
  Filled 2013-04-21: qty 1

## 2013-04-21 MED ORDER — DEXTROSE-NACL 5-0.45 % IV SOLN
INTRAVENOUS | Status: DC
  Administered 2013-04-21: 12:00:00 via INTRAVENOUS

## 2013-04-21 MED ORDER — HYDROMORPHONE HCL 4 MG PO TABS: 4.0000 mg | ORAL_TABLET | ORAL | Status: DC | PRN

## 2013-04-21 NOTE — Telephone Encounter (Signed)
Patient c/o lower abdominal cramps and pain wraps around to lower back for past week; patient c/o new pain to BLE for past couple days, rating pain 7/10. Patient reports unable to manage pain at home at this time. Patient reports taking tramadol, ibuprofen, and oxycodone as ordered for pain management with no improvement. Patient reports nausea and decreased appetite with pain. Patient denies all other pertinent ROS per [place] [facility]ick[facility] Triage Assessment. MD notified. Patient aware to reschedule appointment for missed appointment and to be treated in day hospital today for sickle cell pain to BLE.

## 2013-04-21 NOTE — Discharge Summary (Signed)
Physician Discharge Summary  Cassie Montgomery [id number] DOB: [birth year] DOA: 04/21/2013  PCP: Arshdeep Bolger A., MD  Admit date: 04/21/2013 Discharge date: 04/21/2013  Discharge Diagnoses:  Active Problems: Hb [place] with pain. Anxiety   Discharge Condition: Good. Pain down to 4/10 from 8/10.  Disposition:  Follow-up Information   Follow up with Sidney Kann A., MD. Schedule an appointment as soon as possible for a visit in 1 week.   Specialty:  Internal Medicine   Contact information:   [address] [place] [address] [phone]       Diet: Regular Wt Readings from Last 3 Encounters:  04/21/13 235 lb (106.595 kg)  04/15/13 235 lb (106.595 kg)  02/15/13 231 lb (104.781 kg)    History of present illness: Pt states that she was having pain in the legs which started in the back and radiated down the back of the legs. Pt describes pain as throbbing and despite pain got increasingly worse. Pt rates pain 7-8/10. Pt increased her Oxycodone to 20 mg q 8 hours yesterday but still did not have any significant relief of pain so pt came for management of pain. Pt states that she has not had nay nausea or emesis but has not had an appetite since last week.  Pt continues to have abdominal cramps without any menstrual flow. Pt had an appointment today for pap smear and possible evaluation of prei-menopausal state. However she did not keep her appointment and will reschedule for evaluation of peri-menopausal state.   Hospital Course:  Pt was treated with IV Dilaudid, Toradol and IVF. She had a decrease in her pain from 8/10 to 4/10. She was also given oral Dilaudid and is discharged on oral dilaudid for the next 48 hours then to resume Oxycodone on a PRN basis. On admission the patient had pain with ambulation, however at the time of discharge she was ambulating without difficulty.  Discharge Exam: Filed Vitals:   04/21/13 1615  BP:   Pulse:   Temp:   Resp: 14    Filed Vitals:   04/21/13 1416 04/21/13 1426 04/21/13 1526 04/21/13 1615  BP: 137/83 145/83 115/68   Pulse: 74 80 82   Temp: 98.7 F (37.1 C) 98.1 F (36.7 C) 98.7 F (37.1 C)   TempSrc: Oral Oral Oral   Resp: 11 8 14 14   Height:      Weight:      SpO2: 98% 97% 100% 99%  General: Alert, awake, oriented x3, in no acute distress.  HEENT: [place]/AT PEERL, EOMI, anicteric  OROPHARYNX: Moist, No exudate/ erythema/lesions.  Heart: Regular rate and rhythm, without murmurs, rubs, gallops.  Lungs: Clear to auscultation, no wheezing or rhonchi noted. No increased vocal fremitus resonant to percussion  Abdomen: Soft, nontender, nondistended, positive bowel sounds, no masses no hepatosplenomegaly noted.  Neuro: No focal neurological deficits noted cranial nerves II through XII grossly intact. Strength normal in bilateral upper and lower extremities.  Musculoskeletal: No warm swelling or erythema around joints, no spinal tenderness noted.  Psychiatric: Patient alert and oriented x3, good insight and cognition, good recent to remote recall.   Discharge Instructions     Medication List         folic acid 1 MG tablet  Commonly known as:  FOLVITE  Take 1 mg by mouth daily.     HYDROmorphone 4 MG tablet  Commonly known as:  DILAUDID  Take 1 tablet (4 mg total) by mouth every 4 (four) hours as needed for severe  pain. For the next 48 hours then resume Oxycodone     ibuprofen 800 MG tablet  Commonly known as:  ADVIL,MOTRIN  Take 1 tablet (800 mg total) by mouth every 8 (eight) hours as needed for pain.     levalbuterol 45 MCG/ACT inhaler  Commonly known as:  XOPENEX HFA  Inhale 2 puffs into the lungs 2 (two) times daily as needed for wheezing or shortness of breath.     LORazepam 1 MG tablet  Commonly known as:  ATIVAN  Take 1 tablet (1 mg total) by mouth every 6 (six) hours as needed for anxiety.     omeprazole 40 MG capsule  Commonly known as:  PRILOSEC  Take 1 capsule (40 mg total)  by mouth daily.     Oxycodone HCl 10 MG Tabs  Take 1.5 tablets (15 mg total) by mouth every 6 (six) hours as needed (for pain).     sertraline 50 MG tablet  Commonly known as:  ZOLOFT  Take 1 tablet (50 mg total) by mouth daily.     zolpidem 5 MG tablet  Commonly known as:  AMBIEN  Take 1 tablet (5 mg total) by mouth at bedtime as needed for sleep.          The results of significant diagnostics from this hospitalization (including imaging, microbiology, ancillary and laboratory) are listed below for reference.     Labs: Basic Metabolic Panel:  Recent Labs Lab 04/21/13 1135  NA 137  K 3.1*  CL 97  CO2 28  GLUCOSE 105*  BUN 6  CREATININE 0.85  CALCIUM 9.5   Liver Function Tests:  Recent Labs Lab 04/21/13 1135  AST 14  ALT 10  ALKPHOS 67  BILITOT 3.7*  PROT 7.9  ALBUMIN 4.3   No results found for this basename: LIPASE, AMYLASE,  in the last 168 hours No results found for this basename: AMMONIA,  in the last 168 hours CBC:  Recent Labs Lab 04/15/13 1525 04/21/13 1135  WBC 7.1 9.1  NEUTROABS 4.8 6.1  HGB 10.0* 11.0*  HCT 28.5* 31.7*  MCV 72.9* 73.2*  PLT 148* 160    Time coordinating discharge: 40 minutes  Signed:  Kinsley Nicklaus A.  04/21/2013, 4:58 PM

## 2013-04-21 NOTE — Progress Notes (Signed)
Patient discharged home. Discharge instructions reviewed; patient agrees to call primary care to reschedule follow-up appointment. Prescription given. Patient states she feels better, edge has been taken off, and will attempt to manage pain at home. Patient left day hospital with belongings ambulatory via self.

## 2013-04-21 NOTE — Progress Notes (Signed)
[facility] History and Physical  Cassie Montgomery VHQ:[id number] DOB: 04-02-1971 DOA: 04/21/2013   PCP: Brooklinn Longbottom A., MD   Chief Complaint: Pt with pain in legs x 5 days  HPI:Pt states that she was having pain in the legs which started in the back and radiated down the back of the legs. Pt describes pain as throbbing and despite pain got increasingly worse. Pt rates pain 7-8/10.  Pt increased her Oxycodone to 20 mg q 8 hours yesterday but still did not have any significant relief of pain so pt came for management of pain. Pt states that she has not had nay nausea or emesis but has not had an appetite since last week.   Pt continues to have abdominal cramps without any menstrual flow. Pt had an appointment today for pap smear and possible evaluation of prei-menopausal state. However she did not keep her appointment and will reschedule for evaluation of peri-menopausal state.   Review of Systems:  Constitutional: No weight loss, night sweats, Fevers, chills, fatigue.  HEENT: No headaches, dizziness, seizures, vision changes, difficulty swallowing,Tooth/dental problems,Sore throat, No sneezing, itching, ear ache, nasal congestion, post nasal drip,  Cardio-vascular: No chest pain, Orthopnea, PND, swelling in lower extremities, anasarca, dizziness, palpitations  GI: No heartburn, indigestion, nausea, vomiting, diarrhea, change in bowel habits, Resp: No shortness of breath with exertion or at rest. No excess mucus, no productive cough, No non-productive cough, No coughing up of blood.No change in color of mucus.No wheezing.No chest wall deformity  Skin: no rash or lesions.  GU: no dysuria, change in color of urine, no urgency or frequency. No flank pain.  Psych: No change in mood or affect. No depression or anxiety. No memory loss.    Past Medical History  Diagnosis Date  . PMDD (premenstrual dysphoric disorder)   . Sickle cell anemia   . Sickle cell disease without  crisis 07/15/2011  . Migraine with aura 07/15/2011   Past Surgical History  Procedure Laterality Date  . Cesarean section     Social History:  reports that she has never smoked. She does not have any smokeless tobacco history on file. She reports that she does not drink alcohol or use illicit drugs.  Allergies  Allergen Reactions  . Sulfa Antibiotics Other (See Comments)    Increased bilirubin    Family History  Problem Relation Age of Onset  . Hypertension Mother     Prior to Admission medications   Medication Sig Start Date End Date Taking? Authorizing Provider  folic acid (FOLVITE) 1 MG tablet Take 1 mg by mouth daily.   Yes Historical Provider, MD  ibuprofen (ADVIL,MOTRIN) 800 MG tablet Take 1 tablet (800 mg total) by mouth every 8 (eight) hours as needed for pain. 02/25/13  Yes Grayce Sessions, NP  LORazepam (ATIVAN) 1 MG tablet Take 1 tablet (1 mg total) by mouth every 6 (six) hours as needed for anxiety. 03/10/13  Yes Grayce Sessions, NP  omeprazole (PRILOSEC) 40 MG capsule Take 1 capsule (40 mg total) by mouth daily. 04/15/13  Yes Altha Harm, MD  Oxycodone HCl 10 MG TABS Take 1.5 tablets (15 mg total) by mouth every 6 (six) hours as needed (for pain). 04/07/13  Yes Altha Harm, MD  sertraline (ZOLOFT) 50 MG tablet Take 1 tablet (50 mg total) by mouth daily. 04/15/13  Yes Altha Harm, MD  levalbuterol [facility] HFA) 45 MCG/ACT inhaler Inhale 2 puffs into the lungs 2 (two) times daily as needed  for wheezing or shortness of breath.     Historical Provider, MD  zolpidem (AMBIEN) 5 MG tablet Take 1 tablet (5 mg total) by mouth at bedtime as needed for sleep. 04/15/13   Altha Harm, MD   Physical Exam: Filed Vitals:   04/21/13 1119  BP: 132/91  Pulse: 87  Temp: 98.4 F (36.9 C)  TempSrc: Oral  Resp: 18  Height: 5\' 6"  (1.676 m)  Weight: 235 lb (106.595 kg)  SpO2: 100%   BP 115/68  Pulse 82  Temp(Src) 98.7 F (37.1 C) (Oral)  Resp 14  Ht  5\' 6"  (1.676 m)  Wt 235 lb (106.595 kg)  BMI 37.95 kg/m2  SpO2 99%  LMP 04/12/2013  General Appearance:    Alert, cooperative, no distress, appears stated age  Head:    Normocephalic, without obvious abnormality, atraumatic  Eyes:    PERRL, conjunctiva/corneas clear, EOM's intact, fundi    benign, both eyes anicteric  Throat:   Lips, mucosa, and tongue normal; teeth and gums normal  Neck:   Supple, symmetrical, trachea midline, no adenopathy;    thyroid:  no enlargement/tenderness/nodules; no carotid   bruit or JVD  Back:     Symmetric, no curvature, ROM normal, no CVA tenderness  Lungs:     Clear to auscultation bilaterally, respirations unlabored  Chest Wall:    No tenderness or deformity   Heart:    Regular rate and rhythm, S1 and S2 normal, no murmur, rub   or gallop  Abdomen:     Soft, non-tender, bowel sounds active all four quadrants,    no masses, no organomegaly  Extremities:   Extremities normal, atraumatic, no cyanosis or edema  Pulses:   2+ and symmetric all extremities  Skin:   Skin color, texture, turgor normal, no rashes or lesions  Lymph nodes:   Cervical, supraclavicular, and axillary nodes normal  Neurologic:   CNII-XII intact, normal strength, sensation and reflexes    throughout    Labs on Admission:   Basic Metabolic Panel:  Recent Labs Lab 04/21/13 1135  NA 137  K 3.1*  CL 97  CO2 28  GLUCOSE 105*  BUN 6  CREATININE 0.85  CALCIUM 9.5   Liver Function Tests:  Recent Labs Lab 04/21/13 1135  AST 14  ALT 10  ALKPHOS 67  BILITOT 3.7*  PROT 7.9  ALBUMIN 4.3   CBC:  Recent Labs Lab 04/15/13 1525 04/21/13 1135  WBC 7.1 9.1  NEUTROABS 4.8 6.1  HGB 10.0* 11.0*  HCT 28.5* 31.7*  MCV 72.9* 73.2*  PLT 148* 160     Assessment/Plan: Active Problems: 1. Hb [place] with crisis: Will treat with rapid re-dosing weight based IV analgesics. Reassess and if pain >7/10 will place on PCA. Will also treat with Toradol and IVF.   2. Hypokalemia: treat  with oral repletion. Will likely need suplementation.  3. Abdominal Cramping: Will assess on next office visit.   Time spend: 40 minutes Code Status: full Code Family Communication: N/A Disposition Plan: Not yet determined  Rihanna Marseille A., MD  Pager [phone]  If 7PM-7AM, please contact night-coverage [email or web address] Password [email or web address] 04/21/2013, 1:23 PM

## 2013-04-22 ENCOUNTER — Telehealth: Payer: Self-pay | Admitting: *Deleted

## 2013-04-22 ENCOUNTER — Telehealth: Payer: Self-pay

## 2013-04-22 NOTE — Telephone Encounter (Signed)
Case Management Clinical Tool Patient: Cassie Montgomery MRN: [id number] DOB: February 01, 1971 PCP: Dr. Marthann Montgomery Date Initiated: 04/15/13     Documentation initiated by: Karoline Caldwell RN, BSN, BS Bed Status: Home Subjective/Objective Assessment: [name] is a 42 year old female with known SCD, Hb SS. This CM placed outbound call to [name] to advise her medical certification for [place] [employer] has been completed and she can pick it up. [name] stated she has to come to [facility] on Monday 04/26/13 and will get the letter at this time. This CM placed in receptionist ox to give to [name] and placed a copy to be scanned. Ms Cassie Montgomery states she does not have any issues with obtaining her medications and doesn't have any concerns at this time.   Barriers to care: Need medical certification for [employer] completed Referral(s) made to: none Prior Approval(PA) #:   na                     PA start date:    na     PA end date: na  Action/Plan: This CM sent copy of medical certification for scanning and called [name] to advise of the status. This CM provided [name] with her contact information for future assistance.This CM will continue to monitor.  Comments: none  Time spent on case management: 10 mins  Karoline Caldwell, RN, BSN, [place] [phone]

## 2013-04-22 NOTE — Telephone Encounter (Signed)
Rx refill for Oxycodone [facility] 10 mg #90

## 2013-04-26 ENCOUNTER — Ambulatory Visit: Payer: Medicare Other | Admitting: Internal Medicine

## 2013-04-26 ENCOUNTER — Telehealth: Payer: Self-pay | Admitting: Internal Medicine

## 2013-04-26 ENCOUNTER — Other Ambulatory Visit: Payer: Self-pay | Admitting: Internal Medicine

## 2013-04-26 DIAGNOSIS — G8929 Other chronic pain: Secondary | ICD-10-CM

## 2013-04-26 DIAGNOSIS — D5720 Sickle-cell/Hb-C disease without crisis: Secondary | ICD-10-CM

## 2013-04-26 MED ORDER — OXYCODONE HCL 10 MG PO TABS: 15.0000 mg | ORAL_TABLET | Freq: Four times a day (QID) | ORAL | Status: DC | PRN

## 2013-04-26 NOTE — Telephone Encounter (Signed)
Prescription given for Dilaudid 4 mg #12 pills to be taken in the 1st 48 hours after discharge then to resume Oxycodone. Prescription given for oxycodone 10 mg #90 tabs on 04/21/2013.

## 2013-04-28 ENCOUNTER — Telehealth: Payer: Self-pay | Admitting: Internal Medicine

## 2013-04-28 ENCOUNTER — Ambulatory Visit: Payer: Medicare Other | Admitting: Internal Medicine

## 2013-04-28 NOTE — Progress Notes (Unsigned)
   Subjective:    Patient ID: Cassie Montgomery, female    DOB: [birth year], 42 y.o.   MRN: [id number]  HPI:   Review of Systems     Objective:   Physical Exam        Assessment & Plan:

## 2013-05-03 ENCOUNTER — Ambulatory Visit: Payer: Medicare Other | Admitting: Internal Medicine

## 2013-05-03 ENCOUNTER — Telehealth: Payer: Self-pay | Admitting: *Deleted

## 2013-05-03 NOTE — Telephone Encounter (Addendum)
Pt called to cx 1:30 pm appt at 1:40 pm not feeling well maybe in crisis taking meds no driver to come in will call back to re-schedule appt

## 2013-05-10 ENCOUNTER — Other Ambulatory Visit: Payer: Self-pay | Admitting: Internal Medicine

## 2013-05-10 ENCOUNTER — Telehealth: Payer: Self-pay | Admitting: Hematology

## 2013-05-10 DIAGNOSIS — G8929 Other chronic pain: Secondary | ICD-10-CM

## 2013-05-10 DIAGNOSIS — D5720 Sickle-cell/Hb-C disease without crisis: Secondary | ICD-10-CM

## 2013-05-10 MED ORDER — OXYCODONE HCL 10 MG PO TABS: 15.0000 mg | ORAL_TABLET | Freq: Four times a day (QID) | ORAL | Status: DC | PRN

## 2013-05-10 NOTE — Progress Notes (Signed)
Prescription issued for Oxycodone 10 mg #90 tabs to be taken 1.5 tabs (15 mg) q 6 hrs PRN pain.

## 2013-05-10 NOTE — Telephone Encounter (Signed)
Patient advised that prescription will be ready for pick up tomorrow.

## 2013-05-10 NOTE — Telephone Encounter (Signed)
Oxycodone 10mg

## 2013-05-11 ENCOUNTER — Other Ambulatory Visit (HOSPITAL_COMMUNITY)
Admission: RE | Admit: 2013-05-11 | Discharge: 2013-05-11 | Disposition: A | Discharge status: Home / Self Care | Payer: Medicare Other | Source: Ambulatory Visit | Attending: Internal Medicine | Admitting: Internal Medicine

## 2013-05-11 ENCOUNTER — Ambulatory Visit (INDEPENDENT_AMBULATORY_CARE_PROVIDER_SITE_OTHER): Payer: Medicare Other | Admitting: Internal Medicine

## 2013-05-11 ENCOUNTER — Encounter: Payer: Self-pay | Admitting: Internal Medicine

## 2013-05-11 VITALS — BP 138/92 | HR 80 | Temp 98.3°F | Resp 18 | Ht 68.0 in | Wt 226.0 lb

## 2013-05-11 DIAGNOSIS — F410 Panic disorder [episodic paroxysmal anxiety] without agoraphobia: Secondary | ICD-10-CM

## 2013-05-11 DIAGNOSIS — N926 Irregular menstruation, unspecified: Secondary | ICD-10-CM

## 2013-05-11 DIAGNOSIS — F411 Generalized anxiety disorder: Secondary | ICD-10-CM

## 2013-05-11 DIAGNOSIS — Z124 Encounter for screening for malignant neoplasm of cervix: Secondary | ICD-10-CM

## 2013-05-11 DIAGNOSIS — G4700 Insomnia, unspecified: Secondary | ICD-10-CM | POA: Diagnosis not present

## 2013-05-11 DIAGNOSIS — Z113 Encounter for screening for infections with a predominantly sexual mode of transmission: Secondary | ICD-10-CM | POA: Diagnosis not present

## 2013-05-11 DIAGNOSIS — Z79899 Other long term (current) drug therapy: Secondary | ICD-10-CM | POA: Insufficient documentation

## 2013-05-11 MED ORDER — ZOLPIDEM TARTRATE 5 MG PO TABS: 5.0000 mg | ORAL_TABLET | Freq: Every evening | ORAL | Status: DC | PRN

## 2013-05-11 MED ORDER — LORAZEPAM 1 MG PO TABS: 1.0000 mg | ORAL_TABLET | Freq: Four times a day (QID) | ORAL | Status: DC | PRN
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
## 2013-05-12 ENCOUNTER — Telehealth: Payer: Self-pay | Admitting: *Deleted

## 2013-05-12 ENCOUNTER — Encounter: Payer: Self-pay | Admitting: Internal Medicine

## 2013-05-12 DIAGNOSIS — N926 Irregular menstruation, unspecified: Secondary | ICD-10-CM | POA: Insufficient documentation

## 2013-05-12 NOTE — H&P (Signed)
Patient ID: Cassie Montgomery, female   DOB: Jan 28, 1971, 42 y.o.   MRN: [id number]  This note was created on 04/21/2013 at the time of service. However it was incorrectly filed under "Progress Note" instead of "H&P". It is being copied "as is" without any changes.  [facility] History and Physical   Sydnei Ohaver [id number] DOB: March 14, 1971 DOA: 04/21/2013     PCP: Kalyna Paolella A., MD    Chief Complaint: Pt with pain in legs x 5 days   HPI:Pt states that she was having pain in the legs which started in the back and radiated down the back of the legs. Pt describes pain as throbbing and despite pain got increasingly worse. Pt rates pain 7-8/10.  Pt increased her Oxycodone to 20 mg q 8 hours yesterday but still did not have any significant relief of pain so pt came for management of pain. Pt states that she has not had nay nausea or emesis but has not had an appetite since last week.    Pt continues to have abdominal cramps without any menstrual flow. Pt had an appointment today for pap smear and possible evaluation of prei-menopausal state. However she did not keep her appointment and will reschedule for evaluation of peri-menopausal state.     Review of Systems:  Constitutional: No weight loss, night sweats, Fevers, chills, fatigue.   HEENT: No headaches, dizziness, seizures, vision changes, difficulty swallowing,Tooth/dental problems,Sore throat, No sneezing, itching, ear ache, nasal congestion, post nasal drip,   Cardio-vascular: No chest pain, Orthopnea, PND, swelling in lower extremities, anasarca, dizziness, palpitations   GI: No heartburn, indigestion, nausea, vomiting, diarrhea, change in bowel habits, Resp: No shortness of breath with exertion or at rest. No excess mucus, no productive cough, No non-productive cough, No coughing up of blood.No change in color of mucus.No wheezing.No chest wall deformity   Skin: no rash or lesions.   GU: no dysuria, change  in color of urine, no urgency or frequency. No flank pain.   Psych: No change in mood or affect. No depression or anxiety. No memory loss.       Past Medical History   Diagnosis  Date   .  PMDD (premenstrual dysphoric disorder)     .  Sickle cell anemia     .  Sickle cell disease without crisis  07/15/2011   .  Migraine with aura  07/15/2011    Past Surgical History   Procedure  Laterality  Date   .  Cesarean section        Social History: reports that she has never smoked. She does not have any smokeless tobacco history on file. She reports that she does not drink alcohol or use illicit drugs.    Allergies   Allergen  Reactions   .  Sulfa Antibiotics  Other (See Comments)       Increased bilirubin       Family History   Problem  Relation  Age of Onset   .  Hypertension  Mother         Prior to Admission medications    Medication  Sig  Start Date  End Date  Taking?  Authorizing Provider   folic acid (FOLVITE) 1 MG tablet  Take 1 mg by mouth daily.      Yes  Historical Provider, MD   ibuprofen (ADVIL,MOTRIN) 800 MG tablet  Take 1 tablet (800 mg total) by mouth every 8 (eight) hours as needed for pain.  02/25/13  Yes  Grayce Sessions, NP   LORazepam (ATIVAN) 1 MG tablet  Take 1 tablet (1 mg total) by mouth every 6 (six) hours as needed for anxiety.  03/10/13    Yes  Grayce Sessions, NP   omeprazole (PRILOSEC) 40 MG capsule  Take 1 capsule (40 mg total) by mouth daily.  04/15/13    Yes  Altha Harm, MD   Oxycodone HCl 10 MG TABS  Take 1.5 tablets (15 mg total) by mouth every 6 (six) hours as needed (for pain).  04/07/13    Yes  Altha Harm, MD   sertraline (ZOLOFT) 50 MG tablet  Take 1 tablet (50 mg total) by mouth daily.  04/15/13    Yes  Altha Harm, MD   levalbuterol [facility] HFA) 45 MCG/ACT inhaler  Inhale 2 puffs into the lungs 2 (two) times daily as needed for wheezing or shortness of breath.         Historical Provider, MD   zolpidem (AMBIEN) 5 MG  tablet  Take 1 tablet (5 mg total) by mouth at bedtime as needed for sleep.  04/15/13      Altha Harm, MD      Physical Exam: Filed Vitals:     04/21/13 1119   BP:  132/91   Pulse:  87   Temp:  98.4 F (36.9 C)   TempSrc:  Oral   Resp:  18   Height:  5\' 6"  (1.676 m)   Weight:  235 lb (106.595 kg)   SpO2:  100%    BP 115/68  Pulse 82  Temp(Src) 98.7 F (37.1 C) (Oral)  Resp 14  Ht 5\' 6"  (1.676 m)  Wt 235 lb (106.595 kg)  BMI 37.95 kg/m2  SpO2 99%  LMP 04/12/2013    General Appearance:     Alert, cooperative, no distress, appears stated age   Head:     Normocephalic, without obvious abnormality, atraumatic   Eyes:     PERRL, conjunctiva/corneas clear, EOM's intact, fundi     benign, both eyes anicteric   Throat:    Lips, mucosa, and tongue normal; teeth and gums normal   Neck:    Supple, symmetrical, trachea midline, no adenopathy;     thyroid:  no enlargement/tenderness/nodules; no carotid   bruit or JVD   Back:      Symmetric, no curvature, ROM normal, no CVA tenderness   Lungs:      Clear to auscultation bilaterally, respirations unlabored   Chest Wall:     No tenderness or deformity    Heart:     Regular rate and rhythm, S1 and S2 normal, no murmur, rub   or gallop   Abdomen:      Soft, non-tender, bowel sounds active all four quadrants,     no masses, no organomegaly   Extremities:    Extremities normal, atraumatic, no cyanosis or edema   Pulses:    2+ and symmetric all extremities   Skin:    Skin color, texture, turgor normal, no rashes or lesions   Lymph nodes:    Cervical, supraclavicular, and axillary nodes normal   Neurologic:    CNII-XII intact, normal strength, sensation and reflexes     throughout        Labs on Admission:    Basic Metabolic Panel: Recent Labs Lab  04/21/13 1135   NA  137   K  3.1*   CL  97   CO2  28  GLUCOSE  105*   BUN  6   CREATININE  0.85   CALCIUM  9.5    Liver Function Tests: Recent Labs Lab   04/21/13 1135   AST  14   ALT  10   ALKPHOS  67   BILITOT  3.7*   PROT  7.9   ALBUMIN  4.3    CBC: Recent Labs Lab  04/15/13 1525  04/21/13 1135   WBC  7.1  9.1   NEUTROABS  4.8  6.1   HGB  10.0*  11.0*   HCT  28.5*  31.7*   MCV  72.9*  73.2*   PLT  148*  160        Assessment/Plan: Active Problems: 1. Hb [place] with crisis: Will treat with rapid re-dosing weight based IV analgesics. Reassess and if pain >7/10 will place on PCA. Will also treat with Toradol and IVF.    2. Hypokalemia: treat with oral repletion. Will likely need suplementation.   3. Abdominal Cramping: Will assess on next office visit.     Time spend: 40 minutes Code Status: full Code Family Communication: N/A Disposition Plan: Not yet determined   Gaddiel Cullens A., MD          Pager [phone]   If 7PM-7AM, please contact night-coverage [email or web address] Password [email or web address] 04/21/2013, 1:23 PM

## 2013-05-12 NOTE — Telephone Encounter (Signed)
Referral for Counseling (PTSD) left message. Received return call from office on the process.  Will fax information to [facility] [phone]/ phone # [phone]

## 2013-05-20 ENCOUNTER — Telehealth (HOSPITAL_COMMUNITY): Payer: Self-pay | Admitting: Internal Medicine

## 2013-05-20 ENCOUNTER — Non-Acute Institutional Stay (HOSPITAL_COMMUNITY)
Admission: AD | Admit: 2013-05-20 | Discharge: 2013-05-20 | Disposition: A | Discharge status: Home / Self Care | Payer: Medicare Other | Attending: Internal Medicine | Admitting: Internal Medicine

## 2013-05-20 ENCOUNTER — Encounter (HOSPITAL_COMMUNITY): Payer: Self-pay

## 2013-05-20 DIAGNOSIS — M549 Dorsalgia, unspecified: Secondary | ICD-10-CM | POA: Diagnosis not present

## 2013-05-20 DIAGNOSIS — Z79899 Other long term (current) drug therapy: Secondary | ICD-10-CM | POA: Insufficient documentation

## 2013-05-20 DIAGNOSIS — G8929 Other chronic pain: Secondary | ICD-10-CM

## 2013-05-20 DIAGNOSIS — D5700 Hb-SS disease with crisis, unspecified: Secondary | ICD-10-CM | POA: Diagnosis not present

## 2013-05-20 DIAGNOSIS — M79609 Pain in unspecified limb: Secondary | ICD-10-CM | POA: Insufficient documentation

## 2013-05-20 DIAGNOSIS — D5720 Sickle-cell/Hb-C disease without crisis: Secondary | ICD-10-CM

## 2013-05-20 LAB — CBC WITH DIFFERENTIAL/PLATELET
BASOS PCT: 0 % (ref 0–1)
Basophils Absolute: 0 10*3/uL (ref 0.0–0.1)
EOS ABS: 0.2 10*3/uL (ref 0.0–0.7)
Eosinophils Relative: 3 % (ref 0–5)
HCT: 25 % — ABNORMAL LOW (ref 36.0–46.0)
Hemoglobin: 8.8 g/dL — ABNORMAL LOW (ref 12.0–15.0)
LYMPHS ABS: 1.6 10*3/uL (ref 0.7–4.0)
Lymphocytes Relative: 26 % (ref 12–46)
MCH: 25.5 pg — AB (ref 26.0–34.0)
MCHC: 35.2 g/dL (ref 30.0–36.0)
MCV: 72.5 fL — ABNORMAL LOW (ref 78.0–100.0)
MONOS PCT: 6 % (ref 3–12)
Monocytes Absolute: 0.4 10*3/uL (ref 0.1–1.0)
Neutro Abs: 3.9 10*3/uL (ref 1.7–7.7)
Neutrophils Relative %: 65 % (ref 43–77)
PLATELETS: 107 10*3/uL — AB (ref 150–400)
RBC: 3.45 MIL/uL — ABNORMAL LOW (ref 3.87–5.11)
RDW: 17.3 % — ABNORMAL HIGH (ref 11.5–15.5)
WBC: 6 10*3/uL (ref 4.0–10.5)

## 2013-05-20 LAB — COMPREHENSIVE METABOLIC PANEL
ALBUMIN: 3.5 g/dL (ref 3.5–5.2)
ALT: 10 U/L (ref 0–35)
AST: 15 U/L (ref 0–37)
Alkaline Phosphatase: 60 U/L (ref 39–117)
BILIRUBIN TOTAL: 2.3 mg/dL — AB (ref 0.3–1.2)
BUN: 10 mg/dL (ref 6–23)
CHLORIDE: 101 meq/L (ref 96–112)
CO2: 26 meq/L (ref 19–32)
CREATININE: 0.78 mg/dL (ref 0.50–1.10)
Calcium: 8.9 mg/dL (ref 8.4–10.5)
GFR calc Af Amer: >90 mL/min (ref >90)
Glucose, Bld: 109 mg/dL — ABNORMAL HIGH (ref 70–99)
POTASSIUM: 3.5 meq/L — AB (ref 3.7–5.3)
SODIUM: 139 meq/L (ref 137–147)
Total Protein: 6.3 g/dL (ref 6.0–8.3)

## 2013-05-20 LAB — RETICULOCYTES
RBC.: 3.45 MIL/uL — ABNORMAL LOW (ref 3.87–5.11)
RETIC COUNT ABSOLUTE: 196.7 10*3/uL — AB (ref 19.0–186.0)
Retic Ct Pct: 5.7 % — ABNORMAL HIGH (ref 0.4–3.1)

## 2013-05-20 MED ORDER — HYDROMORPHONE HCL PF 2 MG/ML IJ SOLN
INTRAMUSCULAR | Status: AC
  Filled 2013-05-20: qty 2

## 2013-05-20 MED ORDER — DIPHENHYDRAMINE HCL 25 MG PO CAPS
ORAL_CAPSULE | ORAL | Status: AC
  Administered 2013-05-20: 19:00:00 50 mg via ORAL
  Filled 2013-05-20: qty 2

## 2013-05-20 MED ORDER — HYDROMORPHONE HCL PF 2 MG/ML IJ SOLN
INTRAMUSCULAR | Status: AC
  Administered 2013-05-20: 2.5 mg via SUBCUTANEOUS
  Filled 2013-05-20: qty 2

## 2013-05-20 MED ORDER — METHADONE HCL 5 MG PO TABS
5.0000 mg | ORAL_TABLET | Freq: Once | ORAL | Status: AC
  Administered 2013-05-20: 5 mg via ORAL
  Filled 2013-05-20: qty 1

## 2013-05-20 MED ORDER — DEXTROSE-NACL 5-0.45 % IV SOLN
INTRAVENOUS | Status: DC
  Administered 2013-05-20: 17:00:00 via INTRAVENOUS

## 2013-05-20 MED ORDER — HYDROMORPHONE HCL 4 MG PO TABS
4.0000 mg | ORAL_TABLET | ORAL | Status: DC | PRN
  Administered 2013-05-20: 4 mg via ORAL
  Filled 2013-05-20: qty 1

## 2013-05-20 MED ORDER — HYDROMORPHONE HCL PF 2 MG/ML IJ SOLN
3.0000 mg | Freq: Once | INTRAMUSCULAR | Status: AC
  Administered 2013-05-20: 3 mg via INTRAVENOUS

## 2013-05-20 MED ORDER — METHADONE HCL 5 MG PO TABS: 5.0000 mg | ORAL_TABLET | Freq: Every day | ORAL | Status: DC

## 2013-05-20 MED ORDER — LORAZEPAM 1 MG PO TABS: 1.0000 mg | ORAL_TABLET | Freq: Four times a day (QID) | ORAL | Status: DC | PRN

## 2013-05-20 MED ORDER — HYDROMORPHONE HCL 4 MG PO TABS: 4.0000 mg | ORAL_TABLET | ORAL | Status: DC | PRN

## 2013-05-20 MED ORDER — HYDROMORPHONE HCL PF 2 MG/ML IJ SOLN
3.5000 mg | Freq: Once | INTRAMUSCULAR | Status: AC
  Administered 2013-05-20: 3.5 mg via INTRAVENOUS

## 2013-05-20 MED ORDER — HYDROMORPHONE HCL PF 2 MG/ML IJ SOLN
2.5000 mg | Freq: Once | INTRAMUSCULAR | Status: AC
  Administered 2013-05-20: 2.5 mg via SUBCUTANEOUS

## 2013-05-20 MED ORDER — OXYCODONE HCL 10 MG PO TABS: 15.0000 mg | ORAL_TABLET | Freq: Four times a day (QID) | ORAL | Status: DC | PRN

## 2013-05-20 MED ORDER — DIPHENHYDRAMINE HCL 25 MG PO CAPS
50.0000 mg | ORAL_CAPSULE | Freq: Once | ORAL | Status: AC
  Administered 2013-05-20: 50 mg via ORAL

## 2013-05-20 MED ORDER — FOLIC ACID 1 MG PO TABS
1.0000 mg | ORAL_TABLET | Freq: Every day | ORAL | Status: DC
Start: 2013-05-20 — End: 2013-05-20

## 2013-05-20 MED ORDER — HYDROMORPHONE HCL PF 2 MG/ML IJ SOLN
INTRAMUSCULAR | Status: AC
  Administered 2013-05-20: 3.5 mg via INTRAVENOUS
  Filled 2013-05-20: qty 2

## 2013-05-20 NOTE — Discharge Summary (Signed)
Physician Discharge Summary  Cassie Montgomery VFI:[id number] DOB: 08-Jan-1971 DOA: 05/20/2013  PCP: MATTHEWS,MICHELLE A., MD  Admit date: 05/20/2013 Discharge date: 05/20/2013  Discharge Diagnoses:  Active Problems: Hb [place] with crisis   Discharge Condition: GOod  Disposition:  Home  Diet:Regular  Wt Readings from Last 3 Encounters:  05/11/13 226 lb (102.513 kg)  04/21/13 235 lb (106.595 kg)  04/15/13 235 lb (106.595 kg)    History of present illness:  Pt with Hb SS who presents to the [facility] with c/o pain in back and legs. She used her medications as prescribed and increased oral medications to 2 tabs beginning this morning as instructed. Pt still had no relief and thus presented for acute pain management. She rates her pain as 7/10 with a baseline of 3/10. She has had no N/V/D/F/C.   Hospital Course:  Pt was treated with IVF, Toradol and IV analgesics. Pt was initially treated with weight based rapid re-doing and then transitioned to a weight based PCA (Dilaudid/Morpiine).  Pain has decreased from 8/10 to 4/10. Pt will be discharged home on Methadone and Oxycodone.   Discharge Exam: Filed Vitals:   05/20/13 1543  BP: 146/85  Pulse: 81  Temp: 98.8 F (37.1 C)  Resp: 20   Filed Vitals:   05/20/13 1543  BP: 146/85  Pulse: 81  Temp: 98.8 F (37.1 C)  TempSrc: Oral  Resp: 20  SpO2: 100%  General: Alert, awake, oriented x3, in no acute distress.  HEENT: [place]/AT PEERL, EOMI, anicteric  OROPHARYNX: Moist, No exudate/ erythema/lesions.  Heart: Regular rate and rhythm, without murmurs, rubs, gallops.  Lungs: Clear to auscultation, no wheezing or rhonchi noted. No increased vocal fremitus resonant to percussion  Abdomen: Soft, nontender, nondistended, positive bowel sounds, no masses no hepatosplenomegaly noted.  Neuro: No focal neurological deficits noted cranial nerves II through XII grossly intact. Strength normal in bilateral upper and lower extremities.  Musculoskeletal:  No warm swelling or erythema around joints, no spinal tenderness noted.  Psychiatric: Patient alert and oriented x3, good insight and cognition, good recent to remote recall.  Discharge Instructions  Discharge Orders   Future Appointments Provider Department Dept Phone   06/11/2013 11:00 AM [name], MD [place] [phone]   Future Orders Complete By Expires   Activity as tolerated - No restrictions  As directed    Diet general  As directed        Medication List         folic acid 1 MG tablet  Commonly known as:  FOLVITE  Take 1 mg by mouth daily.     HYDROmorphone 4 MG tablet  Commonly known as:  DILAUDID  Take 1 tablet (4 mg total) by mouth every 4 (four) hours as needed for moderate pain or severe pain. For the next 48 hours then resume usual dose of Oxycodone 15 mg q 4 hours PRN     ibuprofen 800 MG tablet  Commonly known as:  ADVIL,MOTRIN  Take 1 tablet (800 mg total) by mouth every 8 (eight) hours as needed for pain.     levalbuterol 45 MCG/ACT inhaler  Commonly known as:  XOPENEX HFA  Inhale 2 puffs into the lungs 2 (two) times daily as needed for wheezing or shortness of breath.     LORazepam 1 MG tablet  Commonly known as:  ATIVAN  Take 1 tablet (1 mg total) by mouth every 6 (six) hours as needed for anxiety.     methadone  5 MG tablet  Commonly known as:  DOLOPHINE  Take 1 tablet (5 mg total) by mouth at bedtime.     omeprazole 40 MG capsule  Commonly known as:  PRILOSEC  Take 1 capsule (40 mg total) by mouth daily.     Oxycodone HCl 10 MG Tabs  Take 1.5 tablets (15 mg total) by mouth every 6 (six) hours as needed (for pain). Resume in 72 hours after dilaudid completed     sertraline 50 MG tablet  Commonly known as:  ZOLOFT  Take 1 tablet (50 mg total) by mouth daily.     zolpidem 5 MG tablet  Commonly known as:  AMBIEN  Take 1 tablet (5 mg total) by mouth at bedtime as needed for sleep.          The results of  significant diagnostics from this hospitalization (including imaging, microbiology, ancillary and laboratory) are listed below for reference.    Significant Diagnostic Studies: No results found.  Microbiology: No results found for this or any previous visit (from the past 240 hour(s)).   Labs: Basic Metabolic Panel:  Recent Labs Lab 05/20/13 1648  NA 139  K 3.5*  CL 101  CO2 26  GLUCOSE 109*  BUN 10  CREATININE 0.78  CALCIUM 8.9   Liver Function Tests:  Recent Labs Lab 05/20/13 1648  AST 15  ALT 10  ALKPHOS 60  BILITOT 2.3*  PROT 6.3  ALBUMIN 3.5   No results found for this basename: LIPASE, AMYLASE,  in the last 168 hours No results found for this basename: AMMONIA,  in the last 168 hours CBC:  Recent Labs Lab 05/20/13 1648  WBC 6.0  NEUTROABS 3.9  HGB 8.8*  HCT 25.0*  MCV 72.5*  PLT 107*    Time coordinating discharge: 40 minutes  Signed:  MATTHEWS,MICHELLE A.  05/20/2013, 6:56 PM

## 2013-05-20 NOTE — Telephone Encounter (Signed)
Notified patient that Dr. Zigmund Daniel has said that she may come to the day hospital for evaluation; pt states that she will need to get a babysitter first and does not know at what time she can arrive; pt made aware of clinic closing hours and told that she needs to call [facility] before coming today to see if there will be appropriate time for treatment; pt verbalizes understanding

## 2013-05-20 NOTE — Progress Notes (Signed)
MD notified that patient is C/O of itching.  Order for benadryl received.  Also notified physician that patient C/O pain still being 6/10.  Ok to give P.O. Dilaudid at this time.  RN will continue monitor and follow orders.

## 2013-05-20 NOTE — Progress Notes (Signed)
Pt discharged to home; IV removed with no complications noted; pt alert and oriented; ambulatory; pt given discharge instructions, all questions answered; prescriptions for home given to patient

## 2013-05-20 NOTE — H&P (Signed)
Cassie Montgomery History and Physical  Cassie Montgomery PZW:[id number] DOB: 13-Sep-1970 DOA: 05/20/2013   PCP: [name], MD   Chief Complaint: Pain in legs and back x 2 days  HPI: Pt with Hb SS who presents to the [facility] with c/o pain in back and legs. She used her medications as prescribed and increased oral medications to 2 tabs beginning this morning as instructed. Pt still had no relief and thus presented for acute pain management. She rates her pain as 7/10 with a baseline of 3/10. She has had no N/V/D/F/C.   Review of Systems:  Constitutional: No weight loss, night sweats, Fevers, chills, fatigue.  HEENT: No headaches, dizziness, seizures, vision changes, difficulty swallowing,Tooth/dental problems,Sore throat, No sneezing, itching, ear ache, nasal congestion, post nasal drip,  Cardio-vascular: No chest pain, Orthopnea, PND, swelling in lower extremities, anasarca, dizziness, palpitations  GI: No heartburn, indigestion, abdominal pain, nausea, vomiting, diarrhea, change in bowel habits, loss of appetite  Resp: No shortness of breath with exertion or at rest. No excess mucus, no productive cough, No non-productive cough, No coughing up of blood.No change in color of mucus.No wheezing.No chest wall deformity  Skin: no rash or lesions.  GU: no dysuria, change in color of urine, no urgency or frequency. No flank pain.  Psych: No change in mood or affect. No depression or anxiety. No memory loss.    Past Medical History  Diagnosis Date  . PMDD (premenstrual dysphoric disorder)   . Sickle cell anemia   . Sickle cell disease without crisis 07/15/2011  . Migraine with aura 07/15/2011   Past Surgical History  Procedure Laterality Date  . Cesarean section     Social History:  reports that she has never smoked. She does not have any smokeless tobacco history on file. She reports that she does not drink alcohol or use illicit drugs.  Allergies  Allergen Reactions   . Sulfa Antibiotics Other (See Comments)    Increased bilirubin    Family History  Problem Relation Age of Onset  . Hypertension Mother     Prior to Admission medications   Medication Sig Start Date End Date Taking? Authorizing Provider  folic acid (FOLVITE) 1 MG tablet Take 1 mg by mouth daily.   Yes Historical Provider, MD  ibuprofen (ADVIL,MOTRIN) 800 MG tablet Take 1 tablet (800 mg total) by mouth every 8 (eight) hours as needed for pain. 02/25/13  Yes [name], NP  levalbuterol [facility] HFA) 45 MCG/ACT inhaler Inhale 2 puffs into the lungs 2 (two) times daily as needed for wheezing or shortness of breath.    Yes Historical Provider, MD  LORazepam (ATIVAN) 1 MG tablet Take 1 tablet (1 mg total) by mouth every 6 (six) hours as needed for anxiety. 05/11/13  Yes [name], MD  omeprazole (PRILOSEC) 40 MG capsule Take 1 capsule (40 mg total) by mouth daily. 04/15/13  Yes [name], MD  Oxycodone HCl 10 MG TABS Take 1.5 tablets (15 mg total) by mouth every 6 (six) hours as needed (for pain). 05/10/13  Yes [name], MD  sertraline (ZOLOFT) 50 MG tablet Take 1 tablet (50 mg total) by mouth daily. 04/15/13  Yes [name], MD  zolpidem (AMBIEN) 5 MG tablet Take 1 tablet (5 mg total) by mouth at bedtime as needed for sleep. 05/11/13  Yes [name], MD   Physical Exam: Filed Vitals:   05/20/13 1543  BP: 146/85  Pulse: 81  Temp: 98.8 F (37.1 C)  TempSrc: Oral  Resp: 20  SpO2: 100%   BP 130/88  Pulse 86  Temp(Src) 98.8 F (37.1 C) (Oral)  Resp 18  SpO2 99%  LMP 04/27/2013  General Appearance:    Alert, cooperative, no distress, appears stated age  Head:    Normocephalic, without obvious abnormality, atraumatic  Eyes:    PERRL, conjunctiva/corneas clear, EOM's intact, fundi    benign, both eyes anicteric  Throat:   Lips, mucosa, and tongue normal; teeth and gums normal  Neck:   Supple, symmetrical, trachea midline, no  adenopathy;    thyroid:  no enlargement/tenderness/nodules; no carotid   bruit or JVD  Back:     Symmetric, no curvature, ROM normal, no CVA tenderness  Lungs:     Clear to auscultation bilaterally, respirations unlabored  Chest Wall:    No tenderness or deformity   Heart:    Regular rate and rhythm, S1 and S2 normal, no murmur, rub   or gallop  Abdomen:     Soft, non-tender, bowel sounds active all four quadrants,    no masses, no organomegaly  Extremities:   Extremities normal, atraumatic, no cyanosis or edema  Pulses:   2+ and symmetric all extremities  Skin:   Skin color, texture, turgor normal, no rashes or lesions  Lymph nodes:   Cervical, supraclavicular, and axillary nodes normal  Neurologic:   CNII-XII intact, normal strength, sensation and reflexes    throughout    Labs on Admission:     Assessment/Plan: Active Problems: Pt will treated with IVF, Toradol and IV analgesics. Pt will initially be treated with weight based rapid re-doing and then transitioned to a weight based PCA if pain >/= 6/10.    Time spend: 35 minutes Code Status: Full Code Family Communication: N/A Disposition Plan: Anticipate discharge home   [name], MD  Pager [phone]  If 7PM-7AM, please contact night-coverage [email or web address] Password [facility] 05/20/2013, 4:49 PM

## 2013-05-20 NOTE — Telephone Encounter (Signed)
Pt states that she is able to come to the [facility] day hospital and will arrive NLT 3pm; MD notified; pt told to come to center

## 2013-05-20 NOTE — Telephone Encounter (Signed)
Pt called requesting to come to the day hospital and states that she is experiencing pain in her legs and hips; pt states that she has increased the dosing of her oxycodone from 1.5 tablets per dose to 2 tablets per dose this AM for two doses; denies fever, cp, nausea or vomiting, states has had one episode of diarrhea last night; denies sob or abdominal pain; MD notified

## 2013-05-21 ENCOUNTER — Telehealth: Payer: Self-pay | Admitting: Internal Medicine

## 2013-05-21 DIAGNOSIS — G8929 Other chronic pain: Secondary | ICD-10-CM

## 2013-05-21 DIAGNOSIS — D5720 Sickle-cell/Hb-C disease without crisis: Secondary | ICD-10-CM

## 2013-05-21 NOTE — Telephone Encounter (Signed)
Forwarding to Dr. Zigmund Daniel for refill of Oxycodone HCl 10 MG TABS

## 2013-05-22 MED ORDER — OXYCODONE HCL 10 MG PO TABS: 15.0000 mg | ORAL_TABLET | Freq: Four times a day (QID) | ORAL | Status: DC | PRN

## 2013-05-22 NOTE — Telephone Encounter (Signed)
Prescription due on 05/26/2013 issued for Oxycodone 10 mg #90 tabs to be taken 11/2 tabs every 4 hours as needed for pain.

## 2013-05-25 ENCOUNTER — Telehealth (HOSPITAL_COMMUNITY): Payer: Self-pay | Admitting: Hematology

## 2013-05-25 DIAGNOSIS — G8929 Other chronic pain: Secondary | ICD-10-CM

## 2013-05-25 DIAGNOSIS — D5720 Sickle-cell/Hb-C disease without crisis: Secondary | ICD-10-CM

## 2013-05-25 MED ORDER — OXYCODONE HCL 10 MG PO TABS: 15.0000 mg | ORAL_TABLET | Freq: Four times a day (QID) | ORAL | Status: DC | PRN

## 2013-05-25 NOTE — Telephone Encounter (Signed)
Pt can pick up prescription today.

## 2013-05-25 NOTE — Telephone Encounter (Signed)
Called patient back and advised that prescription is ready for pick up today.

## 2013-05-25 NOTE — Telephone Encounter (Signed)
Patient called in to follow up on refill request for Oxycodone Hcl 10mg  tabs.  Per previous documentation refill is due tomorrow 05/26/2013.  I explained that refill will be processed and will be available for pick-up on Friday between 9-4:30

## 2013-05-25 NOTE — Telephone Encounter (Signed)
Prescription from 05/25/2103 failed to print thus a duplicate order has been placed.

## 2013-05-31 NOTE — Progress Notes (Signed)
Patient ID: Cassie Montgomery, female   DOB: [birth year], 43 y.o.   MRN: [id number]  Subjective:     Patient ID: Cassie Montgomery, female DOB: [birth year], 43 y.o. MRN: [id number]    HPI: HPI: Pt presents with c/o abdominal cramping with menses for the last 2 days. She states that her menses has been unusually light. She has not had typical pain of sickle cell disease. She also c/o reflux symptoms and states that she has not been compliant with omeprazole in the last few weeks. She has had no nausea/vomiting/diarrhea/fever or chills.   Review of Systems  Constitutional: Negative.  HENT: Negative.  Eyes: Negative.  Respiratory: Negative.  Cardiovascular: Negative.  Gastrointestinal: Negative.  Endocrine: Negative.  Genitourinary: Suprapubic cramps.  Musculoskeletal: Positive for myalgias and arthralgias.  Skin: Negative.  Allergic/Immunologic: Negative.  Neurological: Negative.  Hematological: Negative.  Psychiatric/Behavioral: Negative.    Objective:   Physical Exam  Constitutional: She is oriented to person, place, and time. She appears well-developed and well-nourished.  HENT:  Head: Normocephalic and atraumatic.  Eyes: Conjunctivae and EOM are normal. Pupils are equal, round, and reactive to light. No scleral icterus.  Neck: Normal range of motion. Neck supple. No JVD present. No thyromegaly present.  Cardiovascular: Normal rate and regular rhythm. Exam reveals no gallop and no friction rub.  No murmur heard.  Pulmonary/Chest: Effort normal and breath sounds normal. She has no wheezes. She has no rales.  Abdominal: Soft. Bowel sounds are normal. She exhibits no distension and no mass. There is no tenderness.  Musculoskeletal: Normal range of motion.  Neurological: She is alert and oriented to person, place, and time. No cranial nerve deficit.  Skin: Skin is warm and dry.  Psychiatric: She has a normal mood and affect. Her behavior is normal. Judgment and thought content normal.      Assessment & Plan:   1. Menstrual Cramps: Will treat with IV Toradol and recommend continued NSAID's on a PRN basis for symptomatic treatment. Refer to [facility] for treatment with Toradol.  2. Hb [place] with pain: Pt has not had vision examination. Needs to be scheduled. Also needs schedule with Hematologist.  Otherwise stable.

## 2013-06-02 ENCOUNTER — Telehealth: Payer: Self-pay | Admitting: Internal Medicine

## 2013-06-02 DIAGNOSIS — F411 Generalized anxiety disorder: Secondary | ICD-10-CM

## 2013-06-02 MED ORDER — LORAZEPAM 1 MG PO TABS: 1.0000 mg | ORAL_TABLET | Freq: Four times a day (QID) | ORAL | Status: DC | PRN

## 2013-06-02 NOTE — Telephone Encounter (Signed)
Prescription filled for Xanax. Prescription for Oxycodone not due until 06/09/2013. Oxycodone cannot be filled more than 72 hours before due.

## 2013-06-02 NOTE — Telephone Encounter (Signed)
Routed to Dr.Matthews for refill of oxycodone and ativan.

## 2013-06-04 NOTE — Telephone Encounter (Signed)
Left a voice message Prescription filled for Xanax. Prescription for Oxycodone not due until 06/09/2013. Oxycodone cannot be filled more than 72 hours before due.  Prescription can be picked up today between 9-4:30

## 2013-06-07 ENCOUNTER — Other Ambulatory Visit: Payer: Self-pay | Admitting: Internal Medicine

## 2013-06-07 DIAGNOSIS — G8929 Other chronic pain: Secondary | ICD-10-CM

## 2013-06-07 MED ORDER — OXYCODONE HCL 10 MG PO TABS: 15.0000 mg | ORAL_TABLET | Freq: Four times a day (QID) | ORAL | Status: DC | PRN

## 2013-06-07 NOTE — Progress Notes (Signed)
Prescription due on 1/29//2015 issued(withoin 72 hrs)  for Oxycodone 10 mg #90 tabs to be taken 11/2 tabs every 4 hours as needed for pain.

## 2013-06-11 ENCOUNTER — Encounter: Payer: Self-pay | Admitting: Family Medicine

## 2013-06-11 ENCOUNTER — Ambulatory Visit (INDEPENDENT_AMBULATORY_CARE_PROVIDER_SITE_OTHER): Payer: Medicare Other | Admitting: Family Medicine

## 2013-06-11 VITALS — BP 114/72 | HR 86 | Temp 98.4°F | Resp 16 | Ht 68.0 in | Wt 233.0 lb

## 2013-06-11 DIAGNOSIS — R197 Diarrhea, unspecified: Secondary | ICD-10-CM | POA: Diagnosis not present

## 2013-06-11 DIAGNOSIS — G4700 Insomnia, unspecified: Secondary | ICD-10-CM | POA: Diagnosis not present

## 2013-06-11 DIAGNOSIS — R5381 Other malaise: Secondary | ICD-10-CM

## 2013-06-11 DIAGNOSIS — D57819 Other sickle-cell disorders with crisis, unspecified: Secondary | ICD-10-CM

## 2013-06-11 DIAGNOSIS — Z9189 Other specified personal risk factors, not elsewhere classified: Secondary | ICD-10-CM

## 2013-06-11 DIAGNOSIS — R5383 Other fatigue: Secondary | ICD-10-CM | POA: Diagnosis not present

## 2013-06-11 DIAGNOSIS — D5720 Sickle-cell/Hb-C disease without crisis: Secondary | ICD-10-CM | POA: Diagnosis not present

## 2013-06-11 DIAGNOSIS — N926 Irregular menstruation, unspecified: Secondary | ICD-10-CM

## 2013-06-11 DIAGNOSIS — Z9289 Personal history of other medical treatment: Secondary | ICD-10-CM

## 2013-06-11 DIAGNOSIS — K219 Gastro-esophageal reflux disease without esophagitis: Secondary | ICD-10-CM

## 2013-06-11 DIAGNOSIS — D696 Thrombocytopenia, unspecified: Secondary | ICD-10-CM

## 2013-06-11 DIAGNOSIS — N6452 Nipple discharge: Secondary | ICD-10-CM

## 2013-06-11 DIAGNOSIS — Z23 Encounter for immunization: Secondary | ICD-10-CM

## 2013-06-11 DIAGNOSIS — N6459 Other signs and symptoms in breast: Secondary | ICD-10-CM

## 2013-06-11 LAB — CBC WITH DIFFERENTIAL/PLATELET
BASOS PCT: 0 % (ref 0–1)
Basophils Absolute: 0 10*3/uL (ref 0.0–0.1)
EOS ABS: 0.2 10*3/uL (ref 0.0–0.7)
Eosinophils Relative: 3 % (ref 0–5)
HCT: 31.3 % — ABNORMAL LOW (ref 36.0–46.0)
HEMOGLOBIN: 10.4 g/dL — AB (ref 12.0–15.0)
Lymphocytes Relative: 36 % (ref 12–46)
Lymphs Abs: 2.9 10*3/uL (ref 0.7–4.0)
MCH: 24.9 pg — AB (ref 26.0–34.0)
MCHC: 33.2 g/dL (ref 30.0–36.0)
MCV: 75.1 fL — ABNORMAL LOW (ref 78.0–100.0)
MONO ABS: 0.4 10*3/uL (ref 0.1–1.0)
MONOS PCT: 5 % (ref 3–12)
NEUTROS PCT: 56 % (ref 43–77)
Neutro Abs: 4.5 10*3/uL (ref 1.7–7.7)
Platelets: 231 10*3/uL (ref 150–400)
RBC: 4.17 MIL/uL (ref 3.87–5.11)
RDW: 19.1 % — AB (ref 11.5–15.5)
WBC: 8.1 10*3/uL (ref 4.0–10.5)

## 2013-06-11 LAB — PROLACTIN: Prolactin: 8.1 ng/mL

## 2013-06-11 LAB — HIV ANTIBODY (ROUTINE TESTING W REFLEX): HIV: NONREACTIVE

## 2013-06-11 NOTE — Patient Instructions (Addendum)
1. Patient to establish sleep routine for insomnia. Recommend that patient not sleep, watch television, or read in bed. Take Ambien only as previously prescribed. Do not take Lorazepam 1 mg prn and Ambien together. Take medications within 1 hour of one another.  2. Leg pain: Patient to elevate extremity to heart level while at rest. Patient to take prn pain medications as previously prescribed 3. Vaccinations: Recommend placing warm, moist compress on injection site as needed 4. Anxiety: Patient to go to follow up appointment as scheduled with behavioral therapist. Patient to follow up with [place] for [place] Patient to return to clinic in 2 weeks for follow up with Dr. Zigmund Daniel

## 2013-06-11 NOTE — Progress Notes (Signed)
Subjective:    Patient ID: Cassie Montgomery, female    DOB: 1971/04/07, 43 y.o.   MRN: [id number]  HPI Patient states that she is following up from last office visit on 05/11/2013 and medication refills. Patient complaining of fatigue and increased anxiety. She feels that her fatigue has worsened over the past 2 weeks from the stress of being a mother of 3 and dealing sickle cell disease. Patient expresses that anxiety and stress is a combination of being a mother of 3 and dealing with sickle cell disease. Fells that she does not supported by children in helping around the house and staying on task with homework assignments. Patient also states that she has to deal with her past daily, but has yet to seek counseling. Patient feels that she is loosing control of her life. Denies suicidal ideations or plan.  Reports that she takes prescribed anxiety and insomnia medications nightly, yet some nights it is difficult to fall asleep.  In addition, patient describes her menstrual periods as "awkward" and is concerned that she may be perimenopausal. Patient reports that she has experienced bilateral nipple discharge on occasion. Patient describes a minimal amount of tan discharge from each nipple last occurring over 2 weeks ago. Patient states nipple discharge in the past, but did not report finding to primary physician.    Review of Systems  Constitutional: Positive for activity change and fatigue. Negative for appetite change.  Eyes: Positive for visual disturbance. Negative for photophobia, pain, discharge, redness and itching.  Respiratory: Negative.   Cardiovascular: Negative.   Gastrointestinal: Negative.   Endocrine: Negative for cold intolerance, heat intolerance, polydipsia, polyphagia and polyuria.  Genitourinary: Negative.   Musculoskeletal: Positive for myalgias (Left leg pain ).  Skin: Negative.   Allergic/Immunologic: Negative for environmental allergies and food allergies.  Neurological:  Negative for dizziness, seizures, facial asymmetry, speech difficulty, light-headedness, numbness and headaches.  Psychiatric/Behavioral: Positive for sleep disturbance and decreased concentration. Negative for suicidal ideas, behavioral problems and agitation. The patient is nervous/anxious.        Objective:   Physical Exam  Constitutional: She is oriented to person, place, and time. She appears well-developed and well-nourished.  HENT:  Head: Normocephalic and atraumatic.  Eyes: EOM are normal. Pupils are equal, round, and reactive to light. Right eye exhibits no discharge. Left eye exhibits no discharge and no exudate. Scleral icterus is present.  Neck: Normal range of motion. Neck supple. No thyromegaly present.  Cardiovascular: Normal rate, regular rhythm and normal heart sounds.   Pulmonary/Chest: Effort normal and breath sounds normal. No respiratory distress.  Abdominal: Soft. Bowel sounds are normal.  Musculoskeletal:       Right shoulder: She exhibits pain. She exhibits no swelling.       Left knee: Tenderness found. Lateral joint line tenderness noted.  Neurological: She is alert and oriented to person, place, and time. No cranial nerve deficit.  Skin: Skin is warm and dry. No erythema.  Psychiatric: She has a normal mood and affect. Her speech is normal and behavior is normal. Judgment normal. Her mood appears not anxious. Cognition and memory are normal.          Assessment & Plan:  1. Nipple discharge: Patient has mammogram scheduled for 4/10 2015. To schedule earlier appointment for mammogram. Will check a prolactin level. 2. Irregular Menstrual Cycle: Patient is concerned about being peri-menopausal. Patient does not report hot flashes or vaginal dryness. Patient has had a negative pregnancy test, normal TSH, but reports nipple  discharge. Patient to return to clinic in 1-2  weeks for evaluation by Dr. Zigmund Daniel.  Will check a prolactin level.  3. Fatigue: Patient states  that she feels more tired than usual, despite hemoglobin being at baseline 3 weeks ago. Patient also states that she has not been tested for HIV since relocating to [facility] and is currently sexually active. Will check a CBC with differential and HIV.  4. Insomnia: Patient to establish sleep routine for insomnia. Recommend that patient not talk on phone, send text messages,  watch television, or read in bed. Create a relaxed atmosphere that that is conducive to falling asleep. Take Ambien only as previously prescribed. Do not take Lorazepam 1 mg prn and Ambien together. Take medications within 1 hour of one another.  4. Anxiety. Patient reports a past diagnosis of impending doom and panic, but has never seen a counselor for therapy. Patient has an appointment with Cassie Montgomery for psychotherpy evaluation. Patient was also referred to [place] per [facility] and sickle cell agency. PHQ-9 reviewed.  5. Vaccinations: Patient was due for TDAP and Pneumovax. Counseled about vaccinations at length 6. Sickle Cell Anemia: Patient to continue with current medication regimen as previously prescribed. Patient to continue daily hydration regimen, states that she drinks 64 ounces of water, sometimes more daily.   Preventative care: Pneumovax and TDAP Labs: CBC, HIV,  and Prolactin level 06/11/2013

## 2013-06-12 MED ORDER — OMEPRAZOLE 40 MG PO CPDR: 40.0000 mg | DELAYED_RELEASE_CAPSULE | Freq: Every day | ORAL | Status: DC

## 2013-06-13 ENCOUNTER — Other Ambulatory Visit: Payer: Self-pay | Admitting: Internal Medicine

## 2013-06-14 ENCOUNTER — Other Ambulatory Visit: Payer: Self-pay | Admitting: Internal Medicine

## 2013-06-14 ENCOUNTER — Telehealth: Payer: Self-pay | Admitting: *Deleted

## 2013-06-14 DIAGNOSIS — N6452 Nipple discharge: Secondary | ICD-10-CM

## 2013-06-14 NOTE — Telephone Encounter (Addendum)
[facility] nipple discharge appointment on 06/23/13 @1 :45 pm  Pt aware of appointment

## 2013-06-15 ENCOUNTER — Telehealth: Payer: Self-pay | Admitting: *Deleted

## 2013-06-15 NOTE — Telephone Encounter (Signed)
Pt called about lab results also had questions about medication Methadone 5 mg if she is to refill or discontinue. Pt also given appointment information about Mammo on 06/23/13 @ 1:45 pm at the [facility]

## 2013-06-16 ENCOUNTER — Telehealth: Payer: Self-pay | Admitting: *Deleted

## 2013-06-16 NOTE — Telephone Encounter (Signed)
Left message on VM Eye exam appointment 07/07/13 @ 9:00 am with Dr. Nehemiah Massed ( [place]) [phone] [address]

## 2013-06-17 ENCOUNTER — Telehealth: Payer: Self-pay | Admitting: *Deleted

## 2013-06-17 ENCOUNTER — Telehealth: Payer: Self-pay | Admitting: Internal Medicine

## 2013-06-17 DIAGNOSIS — G894 Chronic pain syndrome: Secondary | ICD-10-CM

## 2013-06-17 DIAGNOSIS — D5720 Sickle-cell/Hb-C disease without crisis: Secondary | ICD-10-CM

## 2013-06-17 MED ORDER — METHADONE HCL 5 MG PO TABS: 5.0000 mg | ORAL_TABLET | Freq: Every day | ORAL | Status: DC

## 2013-06-17 NOTE — Telephone Encounter (Signed)
Pt requesting Methadone refill

## 2013-06-17 NOTE — Telephone Encounter (Signed)
Prescription refilled for Methadone 5 mg #60.

## 2013-06-17 NOTE — Telephone Encounter (Signed)
Pt called with concerns of being out of Methadone she has 3 pills left also would like to know if this will be an issue. Per patient medication called in today.

## 2013-06-23 ENCOUNTER — Ambulatory Visit
Admission: RE | Admit: 2013-06-23 | Discharge: 2013-06-23 | Disposition: A | Discharge status: Home / Self Care | Payer: Medicare Other | Source: Ambulatory Visit | Attending: Internal Medicine | Admitting: Internal Medicine

## 2013-06-23 DIAGNOSIS — N6459 Other signs and symptoms in breast: Secondary | ICD-10-CM | POA: Diagnosis not present

## 2013-06-23 DIAGNOSIS — N6452 Nipple discharge: Secondary | ICD-10-CM

## 2013-06-24 ENCOUNTER — Encounter: Payer: Self-pay | Admitting: Internal Medicine

## 2013-06-24 ENCOUNTER — Ambulatory Visit (INDEPENDENT_AMBULATORY_CARE_PROVIDER_SITE_OTHER): Payer: Medicare Other | Admitting: Internal Medicine

## 2013-06-24 ENCOUNTER — Other Ambulatory Visit: Payer: Self-pay | Admitting: Internal Medicine

## 2013-06-24 VITALS — BP 144/86 | HR 109 | Temp 98.4°F | Resp 20 | Ht 68.0 in | Wt 235.0 lb

## 2013-06-24 DIAGNOSIS — E559 Vitamin D deficiency, unspecified: Secondary | ICD-10-CM | POA: Diagnosis not present

## 2013-06-24 DIAGNOSIS — E876 Hypokalemia: Secondary | ICD-10-CM | POA: Diagnosis not present

## 2013-06-24 DIAGNOSIS — J329 Chronic sinusitis, unspecified: Secondary | ICD-10-CM | POA: Diagnosis not present

## 2013-06-24 DIAGNOSIS — G8929 Other chronic pain: Secondary | ICD-10-CM

## 2013-06-24 DIAGNOSIS — K219 Gastro-esophageal reflux disease without esophagitis: Secondary | ICD-10-CM

## 2013-06-24 MED ORDER — FLUTICASONE PROPIONATE 50 MCG/ACT NA SUSP: 2.0000 | Freq: Every day | NASAL | Status: DC

## 2013-06-24 MED ORDER — OMEPRAZOLE 40 MG PO CPDR: 40.0000 mg | DELAYED_RELEASE_CAPSULE | Freq: Two times a day (BID) | ORAL | Status: DC

## 2013-06-24 MED ORDER — OXYCODONE HCL 10 MG PO TABS: 15.0000 mg | ORAL_TABLET | Freq: Four times a day (QID) | ORAL | Status: DC | PRN

## 2013-06-24 MED ORDER — VITAMIN D (ERGOCALCIFEROL) 1.25 MG (50000 UNIT) PO CAPS: 50000.0000 [IU] | ORAL_CAPSULE | ORAL | Status: DC

## 2013-06-24 MED ORDER — POTASSIUM CHLORIDE ER 20 MEQ PO TBCR: 20.0000 meq | EXTENDED_RELEASE_TABLET | Freq: Every day | ORAL | Status: DC

## 2013-06-24 NOTE — Progress Notes (Signed)
   Subjective:    Patient ID: Cassie Montgomery, female    DOB: 11/22/1970, 43 y.o.   MRN: [id number]  HPI: Pt here for follow up on her complaints of peri-menopausal issues. Pt also c/o  burning in the chest with symptoms occurring mostly at night.  She states that she has been taking pepto-bismol. She has been consistent with staying away from trigger foods. She denies early satiety  She also c/o a feeling of "something in her throat" which causes to need to clear her throat.    Review of Systems  Constitutional: Negative.   HENT: Negative.   Eyes: Negative.   Respiratory: Negative.   Cardiovascular: Negative.   Gastrointestinal:       Reflux symptoms  Endocrine: Negative.   Genitourinary: Negative.   Skin: Negative.   Allergic/Immunologic: Negative.   Neurological: Negative.   Hematological: Negative.   Psychiatric/Behavioral: Negative.        Objective:   Physical Exam  Constitutional: She is oriented to person, place, and time. She appears well-developed and well-nourished.  HENT:  Head: Normocephalic and atraumatic.  Eyes: Conjunctivae and EOM are normal. Pupils are equal, round, and reactive to light. No scleral icterus.  Neck: Normal range of motion. Neck supple. No JVD present. No thyromegaly present.  Cardiovascular: Normal rate and regular rhythm.  Exam reveals no gallop and no friction rub.   No murmur heard. Pulmonary/Chest: Effort normal and breath sounds normal. She has no wheezes. She has no rales.  Abdominal: Soft. Bowel sounds are normal. She exhibits no distension and no mass. There is no tenderness.  Musculoskeletal: Normal range of motion.  Neurological: She is alert and oriented to person, place, and time. No cranial nerve deficit.  Skin: Skin is warm and dry.  Psychiatric: She has a normal mood and affect. Her behavior is normal. Judgment and thought content normal.          Assessment & Plan:  1. GERD: Pt has a history of GERD and has been  controlled on Prilosec. She states that she has had increased symptoms in the several days. Her symptoms include burning in the chest with symptoms occurring mostly at night.  She states that she has been taking pepto-bismol. She has been consistent with staying away from trigger foods. Will change to BID dosingof Prilosec. If symptoms persist will change to Nexium and refer to GI. Stop Ibuprofen.  2. Cough: Post nasal drip: Will prescribe Flonase. No evidence of infection.   3. Peri-menopausal symptoms: Discussed results of Prolactin. Pt has no vaso-motor symptoms but has been having oliomenorhea.  Will continue to monitor for other vaso-motor symptoms.  4. Vitamin D deficiency:Check vitamin D levels. Start ergocalciferol 50,000 uints weekly.  5. Hypokalemia: Supplement Potassium  6. PTSD: Pt has symptoms consistent with PTSD. She has been referred to therapy but states that her life is good right now and she is not read to revisit the trauma of her past. Reinforced for patient that therapy is open to her whenever she is ready and that this is a same space.  7. Hb [place]: PAin well controlled. Prescription re-filled for Oxycodone.

## 2013-06-25 ENCOUNTER — Telehealth: Payer: Self-pay

## 2013-06-25 LAB — VITAMIN D 25 HYDROXY (VIT D DEFICIENCY, FRACTURES): Vit D, 25-Hydroxy: 15 ng/mL — ABNORMAL LOW (ref 30–89)

## 2013-06-28 ENCOUNTER — Telehealth (HOSPITAL_COMMUNITY): Payer: Self-pay | Admitting: Hematology

## 2013-06-28 ENCOUNTER — Inpatient Hospital Stay (HOSPITAL_COMMUNITY)
Admission: AD | Admit: 2013-06-28 | Discharge: 2013-07-02 | DRG: 812 | Disposition: A | Discharge status: Home / Self Care | Payer: Medicare Other | Attending: Family Medicine | Admitting: Family Medicine

## 2013-06-28 ENCOUNTER — Encounter (HOSPITAL_COMMUNITY): Payer: Self-pay | Admitting: *Deleted

## 2013-06-28 DIAGNOSIS — Z79899 Other long term (current) drug therapy: Secondary | ICD-10-CM | POA: Diagnosis not present

## 2013-06-28 DIAGNOSIS — J45909 Unspecified asthma, uncomplicated: Secondary | ICD-10-CM | POA: Diagnosis present

## 2013-06-28 DIAGNOSIS — F410 Panic disorder [episodic paroxysmal anxiety] without agoraphobia: Secondary | ICD-10-CM

## 2013-06-28 DIAGNOSIS — D5700 Hb-SS disease with crisis, unspecified: Secondary | ICD-10-CM | POA: Diagnosis present

## 2013-06-28 DIAGNOSIS — R110 Nausea: Secondary | ICD-10-CM

## 2013-06-28 DIAGNOSIS — D5720 Sickle-cell/Hb-C disease without crisis: Secondary | ICD-10-CM

## 2013-06-28 DIAGNOSIS — F411 Generalized anxiety disorder: Secondary | ICD-10-CM

## 2013-06-28 DIAGNOSIS — Z23 Encounter for immunization: Secondary | ICD-10-CM

## 2013-06-28 DIAGNOSIS — D57219 Sickle-cell/Hb-C disease with crisis, unspecified: Secondary | ICD-10-CM | POA: Diagnosis present

## 2013-06-28 DIAGNOSIS — G8929 Other chronic pain: Secondary | ICD-10-CM | POA: Diagnosis not present

## 2013-06-28 DIAGNOSIS — F329 Major depressive disorder, single episode, unspecified: Secondary | ICD-10-CM

## 2013-06-28 DIAGNOSIS — N926 Irregular menstruation, unspecified: Secondary | ICD-10-CM

## 2013-06-28 DIAGNOSIS — T40605A Adverse effect of unspecified narcotics, initial encounter: Secondary | ICD-10-CM

## 2013-06-28 DIAGNOSIS — R197 Diarrhea, unspecified: Secondary | ICD-10-CM

## 2013-06-28 DIAGNOSIS — M79606 Pain in leg, unspecified: Secondary | ICD-10-CM

## 2013-06-28 DIAGNOSIS — G4700 Insomnia, unspecified: Secondary | ICD-10-CM

## 2013-06-28 DIAGNOSIS — F3289 Other specified depressive episodes: Secondary | ICD-10-CM

## 2013-06-28 DIAGNOSIS — R140 Abdominal distension (gaseous): Secondary | ICD-10-CM

## 2013-06-28 DIAGNOSIS — Z8249 Family history of ischemic heart disease and other diseases of the circulatory system: Secondary | ICD-10-CM | POA: Diagnosis not present

## 2013-06-28 DIAGNOSIS — R0609 Other forms of dyspnea: Secondary | ICD-10-CM

## 2013-06-28 DIAGNOSIS — N943 Premenstrual tension syndrome: Secondary | ICD-10-CM | POA: Diagnosis present

## 2013-06-28 DIAGNOSIS — Z124 Encounter for screening for malignant neoplasm of cervix: Secondary | ICD-10-CM

## 2013-06-28 DIAGNOSIS — H05229 Edema of unspecified orbit: Secondary | ICD-10-CM

## 2013-06-28 DIAGNOSIS — D649 Anemia, unspecified: Secondary | ICD-10-CM

## 2013-06-28 DIAGNOSIS — R410 Disorientation, unspecified: Secondary | ICD-10-CM

## 2013-06-28 DIAGNOSIS — D571 Sickle-cell disease without crisis: Secondary | ICD-10-CM

## 2013-06-28 DIAGNOSIS — F19921 Other psychoactive substance use, unspecified with intoxication with delirium: Secondary | ICD-10-CM

## 2013-06-28 DIAGNOSIS — D696 Thrombocytopenia, unspecified: Secondary | ICD-10-CM

## 2013-06-28 DIAGNOSIS — G894 Chronic pain syndrome: Secondary | ICD-10-CM

## 2013-06-28 LAB — COMPREHENSIVE METABOLIC PANEL
ALT: 20 U/L (ref 0–35)
AST: 49 U/L — ABNORMAL HIGH (ref 0–37)
Albumin: 3.6 g/dL (ref 3.5–5.2)
Alkaline Phosphatase: 56 U/L (ref 39–117)
BILIRUBIN TOTAL: 3.1 mg/dL — AB (ref 0.3–1.2)
BUN: 8 mg/dL (ref 6–23)
CO2: 26 mEq/L (ref 19–32)
CREATININE: 0.75 mg/dL (ref 0.50–1.10)
Calcium: 9.2 mg/dL (ref 8.4–10.5)
Chloride: 101 mEq/L (ref 96–112)
GFR calc Af Amer: >90 mL/min (ref >90)
GFR calc non Af Amer: >90 mL/min (ref >90)
Glucose, Bld: 137 mg/dL — ABNORMAL HIGH (ref 70–99)
Potassium: 4.1 mEq/L (ref 3.7–5.3)
Sodium: 139 mEq/L (ref 137–147)
TOTAL PROTEIN: 7 g/dL (ref 6.0–8.3)

## 2013-06-28 LAB — CBC WITH DIFFERENTIAL/PLATELET
Basophils Absolute: 0 10*3/uL (ref 0.0–0.1)
Basophils Relative: 0 % (ref 0–1)
EOS ABS: 0.2 10*3/uL (ref 0.0–0.7)
EOS PCT: 4 % (ref 0–5)
HEMATOCRIT: 21.9 % — AB (ref 36.0–46.0)
Hemoglobin: 7.4 g/dL — ABNORMAL LOW (ref 12.0–15.0)
Lymphocytes Relative: 25 % (ref 12–46)
Lymphs Abs: 1.4 10*3/uL (ref 0.7–4.0)
MCH: 25.4 pg — AB (ref 26.0–34.0)
MCHC: 33.8 g/dL (ref 30.0–36.0)
MCV: 75.3 fL — ABNORMAL LOW (ref 78.0–100.0)
Monocytes Absolute: 0.5 10*3/uL (ref 0.1–1.0)
Monocytes Relative: 8 % (ref 3–12)
Neutro Abs: 3.5 10*3/uL (ref 1.7–7.7)
Neutrophils Relative %: 63 % (ref 43–77)
Platelets: 137 10*3/uL — ABNORMAL LOW (ref 150–400)
RBC: 2.91 MIL/uL — ABNORMAL LOW (ref 3.87–5.11)
RDW: 19.2 % — ABNORMAL HIGH (ref 11.5–15.5)
WBC: 5.6 10*3/uL (ref 4.0–10.5)

## 2013-06-28 MED ORDER — ONDANSETRON HCL 4 MG/2ML IJ SOLN: 4.0000 mg | Freq: Four times a day (QID) | INTRAMUSCULAR | Status: DC | PRN

## 2013-06-28 MED ORDER — DIPHENHYDRAMINE HCL 50 MG/ML IJ SOLN: 12.5000 mg | Freq: Four times a day (QID) | INTRAMUSCULAR | Status: DC | PRN

## 2013-06-28 MED ORDER — DIPHENHYDRAMINE HCL 12.5 MG/5ML PO ELIX: 12.5000 mg | ORAL_SOLUTION | Freq: Four times a day (QID) | ORAL | Status: DC | PRN

## 2013-06-28 MED ORDER — SERTRALINE HCL 50 MG PO TABS
50.0000 mg | ORAL_TABLET | Freq: Every day | ORAL | Status: DC
  Administered 2013-06-29 – 2013-07-02 (×4): 50 mg via ORAL
  Filled 2013-06-28 (×4): qty 1

## 2013-06-28 MED ORDER — HYDROMORPHONE HCL PF 2 MG/ML IJ SOLN
3.1000 mg | Freq: Once | INTRAMUSCULAR | Status: AC
  Administered 2013-06-28: 3.1 mg via INTRAVENOUS
  Filled 2013-06-28: qty 2

## 2013-06-28 MED ORDER — ALBUTEROL SULFATE (2.5 MG/3ML) 0.083% IN NEBU
2.5000 mg | INHALATION_SOLUTION | Freq: Four times a day (QID) | RESPIRATORY_TRACT | Status: DC | PRN
  Administered 2013-06-28 – 2013-07-02 (×2): 2.5 mg via RESPIRATORY_TRACT
  Filled 2013-06-28 (×2): qty 3

## 2013-06-28 MED ORDER — PANTOPRAZOLE SODIUM 40 MG PO TBEC
40.0000 mg | DELAYED_RELEASE_TABLET | Freq: Every day | ORAL | Status: DC
  Administered 2013-06-29 – 2013-07-02 (×4): 40 mg via ORAL
  Filled 2013-06-28 (×4): qty 1

## 2013-06-28 MED ORDER — HYDROMORPHONE HCL PF 2 MG/ML IJ SOLN
2.5000 mg | Freq: Once | INTRAMUSCULAR | Status: AC
  Administered 2013-06-28: 2.5 mg via INTRAVENOUS
  Filled 2013-06-28: qty 2

## 2013-06-28 MED ORDER — LEVALBUTEROL TARTRATE 45 MCG/ACT IN AERO: 2.0000 | INHALATION_SPRAY | Freq: Four times a day (QID) | RESPIRATORY_TRACT | Status: DC | PRN

## 2013-06-28 MED ORDER — NALOXONE HCL 0.4 MG/ML IJ SOLN: 0.4000 mg | INTRAMUSCULAR | Status: DC | PRN

## 2013-06-28 MED ORDER — FOLIC ACID 1 MG PO TABS
1.0000 mg | ORAL_TABLET | Freq: Every day | ORAL | Status: DC
  Administered 2013-06-29 – 2013-07-02 (×4): 1 mg via ORAL
  Filled 2013-06-28 (×4): qty 1

## 2013-06-28 MED ORDER — SODIUM CHLORIDE 0.9 % IJ SOLN: 9.0000 mL | INTRAMUSCULAR | Status: DC | PRN

## 2013-06-28 MED ORDER — LORAZEPAM 1 MG PO TABS
1.0000 mg | ORAL_TABLET | Freq: Four times a day (QID) | ORAL | Status: DC | PRN
  Administered 2013-06-28 – 2013-07-01 (×4): 1 mg via ORAL
  Filled 2013-06-28 (×3): qty 1

## 2013-06-28 MED ORDER — METHADONE HCL 5 MG PO TABS
5.0000 mg | ORAL_TABLET | Freq: Every day | ORAL | Status: DC
  Administered 2013-06-29 – 2013-07-01 (×4): 5 mg via ORAL
  Filled 2013-06-28 (×3): qty 1

## 2013-06-28 MED ORDER — KETOROLAC TROMETHAMINE 15 MG/ML IJ SOLN
30.0000 mg | Freq: Four times a day (QID) | INTRAMUSCULAR | Status: DC
  Administered 2013-06-29 – 2013-07-02 (×13): 30 mg via INTRAVENOUS
  Filled 2013-06-28 (×21): qty 2

## 2013-06-28 MED ORDER — HYDROMORPHONE 0.3 MG/ML IV SOLN
INTRAVENOUS | Status: DC
  Administered 2013-06-28: 5.94 mg via INTRAVENOUS
  Administered 2013-06-28: 17:00:00 via INTRAVENOUS
  Administered 2013-06-29: 6.56 mg via INTRAVENOUS
  Administered 2013-06-29: 3 mg via INTRAVENOUS
  Administered 2013-06-29: 02:00:00 via INTRAVENOUS
  Administered 2013-06-29: 14.451 mg via INTRAVENOUS
  Filled 2013-06-28 (×4): qty 25

## 2013-06-28 MED ORDER — VITAMIN D (ERGOCALCIFEROL) 1.25 MG (50000 UNIT) PO CAPS: 50000.0000 [IU] | ORAL_CAPSULE | ORAL | Status: DC

## 2013-06-28 MED ORDER — ONDANSETRON HCL 4 MG PO TABS
4.0000 mg | ORAL_TABLET | Freq: Once | ORAL | Status: AC
  Administered 2013-06-28: 4 mg via ORAL
  Filled 2013-06-28: qty 1

## 2013-06-28 MED ORDER — ONDANSETRON 4 MG PO TBDP
4.0000 mg | ORAL_TABLET | Freq: Once | ORAL | Status: DC
  Filled 2013-06-28: qty 1

## 2013-06-28 MED ORDER — ZOLPIDEM TARTRATE 5 MG PO TABS
5.0000 mg | ORAL_TABLET | Freq: Every evening | ORAL | Status: DC | PRN
  Administered 2013-06-29 – 2013-07-01 (×4): 5 mg via ORAL
  Filled 2013-06-28 (×4): qty 1

## 2013-06-28 MED ORDER — DEXTROSE-NACL 5-0.45 % IV SOLN
INTRAVENOUS | Status: DC
  Administered 2013-06-28 – 2013-06-30 (×4): via INTRAVENOUS
  Administered 2013-06-30: 100 mL via INTRAVENOUS
  Administered 2013-07-01 (×3): via INTRAVENOUS

## 2013-06-28 MED ORDER — KETOROLAC TROMETHAMINE 30 MG/ML IJ SOLN
30.0000 mg | Freq: Once | INTRAMUSCULAR | Status: AC
  Administered 2013-06-28: 30 mg via INTRAVENOUS
  Filled 2013-06-28: qty 1

## 2013-06-28 NOTE — H&P (Signed)
[facility] History and Physical  Cassie Montgomery WGN:[id number] DOB: Jan 14, 1971 DOA: 06/28/2013   PCP: MATTHEWS,MICHELLE A., MD   Chief Complaint: Sickle Cell Pain crisis  HPI: Patient states that she is having pain to bilateral lower extremities and palmer aspect of feet that began early on 06/27/2013. Patient reports that pain is 8/10, throbbing, and constant. Pain was unrelieved by increasing Oxycodone to 20 mg on 06/27/2013. Oxycodone 10 mg was last taken on 06/28/2013 at 12:00 with minimal relief. Patient states that typically tolerated pain is 3/4.   Also, patient states that she has a history of controlled asthma. Patient states that she started wheezing on 06/28/2013 after going shopping. Patient used albuterol inhaler, which relieved wheezing.    Review of Systems:  Constitutional: No weight loss, night sweats, Fevers, chills, fatigue.  HEENT: No headaches, dizziness, seizures, vision changes, difficulty swallowing,Tooth/dental problems,Sore throat, No sneezing, itching, ear ache, nasal congestion, post nasal drip,  Cardio-vascular: No chest pain, Orthopnea, PND, swelling in lower extremities, anasarca, dizziness, palpitations  GI: No heartburn, indigestion, abdominal pain, nausea, vomiting, diarrhea, change in bowel habits, loss of appetite  Resp: No shortness of breath with exertion or at rest. No excess mucus, no productive cough, No non-productive cough, No coughing up of blood.No change in color of mucus.Wheezing present.No chest wall deformity  Skin: no rash or lesions.  GU: no dysuria, change in color of urine, no urgency or frequency. No flank pain.  Musculoskeletal: Bilateral lower extremity pain or swelling. Decreased range of motion. No back pain.  Psych: No change in mood or affect. No depression or anxiety. No memory loss.    Past Medical History  Diagnosis Date  . PMDD (premenstrual dysphoric disorder)   . Sickle cell anemia   . Sickle cell disease  without crisis 07/15/2011  . Migraine with aura 07/15/2011   Past Surgical History  Procedure Laterality Date  . Cesarean section     Social History:  reports that she has never smoked. She does not have any smokeless tobacco history on file. She reports that she does not drink alcohol or use illicit drugs.  Allergies  Allergen Reactions  . Sulfa Antibiotics Other (See Comments)    Increased bilirubin    Family History  Problem Relation Age of Onset  . Hypertension Mother     Prior to Admission medications   Medication Sig Start Date End Date Taking? Authorizing Provider  fluticasone (FLONASE) 50 MCG/ACT nasal spray Place 2 sprays into both nostrils daily. 06/24/13  Yes Altha HarmMichelle A Matthews, MD  folic acid (FOLVITE) 1 MG tablet Take 1 mg by mouth daily.   Yes Historical Provider, MD  HYDROmorphone (DILAUDID) 4 MG tablet Take 1 tablet (4 mg total) by mouth every 4 (four) hours as needed for moderate pain or severe pain. For the next 48 hours then resume usual dose of Oxycodone 15 mg q 4 hours PRN 05/20/13  Yes Altha HarmMichelle A Matthews, MD  ibuprofen (ADVIL,MOTRIN) 800 MG tablet Take 1 tablet (800 mg total) by mouth every 8 (eight) hours as needed for pain. 02/25/13  Yes Grayce SessionsMichelle P Edwards, NP  levalbuterol [facility](XOPENEX HFA) 45 MCG/ACT inhaler Inhale 2 puffs into the lungs 2 (two) times daily as needed for wheezing or shortness of breath.    Yes Historical Provider, MD  LORazepam (ATIVAN) 1 MG tablet Take 1 tablet (1 mg total) by mouth every 6 (six) hours as needed for anxiety. 06/02/13  Yes Altha HarmMichelle A Matthews, MD  methadone (DOLOPHINE) 5 MG  tablet Take 1 tablet (5 mg total) by mouth at bedtime. 06/17/13  Yes [name], MD  omeprazole (PRILOSEC) 40 MG capsule Take 1 capsule (40 mg total) by mouth 2 (two) times daily. 06/24/13  Yes [name], MD  Oxycodone HCl 10 MG TABS Take 1.5 tablets (15 mg total) by mouth every 6 (six) hours as needed (for pain). 06/24/13  Yes [name], MD   Potassium Chloride ER 20 MEQ TBCR Take 20 mEq by mouth daily. 06/24/13  Yes [name], MD  sertraline (ZOLOFT) 50 MG tablet TAKE 1 TABLET (50 MG TOTAL) BY MOUTH DAILY. 06/13/13  Yes [name], MD  Vitamin D, Ergocalciferol, (DRISDOL) 50000 UNITS CAPS capsule Take 1 capsule (50,000 Units total) by mouth every 7 (seven) days. 06/24/13  Yes [name], MD  zolpidem (AMBIEN) 5 MG tablet Take 1 tablet (5 mg total) by mouth at bedtime as needed for sleep. 05/11/13  Yes [name], MD   Physical Exam: Filed Vitals:   06/28/13 1351  BP: 132/80  Pulse: 102  Temp: 98.2 F (36.8 C)  TempSrc: Oral  Resp: 18  SpO2: 100%   BP 132/80  Pulse 102  Temp(Src) 98.2 F (36.8 C) (Oral)  Resp 18  SpO2 100%  LMP 06/23/2013  General Appearance:    Alert, cooperative, no distress, appears stated age  Head:    Normocephalic, without obvious abnormality, atraumatic  Eyes:    PERRL, conjunctiva icteric, EOM's intact, fundi    benign, both eyes  Ears:    Normal TM's and external ear canals, both ears  Nose:   Nares normal, septum midline, mucosa normal, no drainage    or sinus tenderness  Throat:   Lips, mucosa, and tongue normal; teeth and gums normal  Neck:   Supple, symmetrical, trachea midline, no adenopathy;    thyroid:  no enlargement/tenderness/nodules; no carotid   bruit or JVD  Back:     Symmetric, no curvature, ROM normal, no CVA tenderness  Lungs:     Wheezes to auscultation bilaterally, respirations unlabored  Chest Wall:    No tenderness or deformity   Heart:    Regular rate and rhythm, S1 and S2 normal, no murmur, rub   or gallop     Abdomen:     Soft, non-tender, bowel sounds active all four quadrants,    no masses, no organomegaly  Extremities:   Lower extremities tender to touch, atraumatic, no cyanosis or edema. Decreased range of motion  Pulses:   2+ and symmetric all extremities  Skin:   Skin color, texture, turgor normal, no rashes or lesions   Lymph nodes:   Cervical, supraclavicular, and axillary nodes normal  Neurologic:   CNII-XII intact, normal strength, sensation and reflexes    throughout    Labs on Admission:   Basic Metabolic Panel:  Recent Labs Lab 06/28/13 1405  NA 139  K 4.1  CL 101  CO2 26  GLUCOSE 137*  BUN 8  CREATININE 0.75  CALCIUM 9.2   Liver Function Tests:  Recent Labs Lab 06/28/13 1405  AST 49*  ALT 20  ALKPHOS 56  BILITOT 3.1*  PROT 7.0  ALBUMIN 3.6   CBC:  Recent Labs Lab 06/28/13 1405  WBC 5.6  NEUTROABS 3.5  HGB 7.4*  HCT 21.9*  MCV 75.3*  PLT 137*   BNP: No components found with this basename: POCBNP,    Radiological Exams on Admission: No results found.  EKG: Independently  reviewed.   Assessment/Plan: 1. Hgb [place] with pain crisis: IV analgesics: weight based rapid redosing with Dilaudid IV, If pain is >7-10, will start weight based PCA. IV Toradol, and D5 1/2. Incentive spirometer 2. Wheezing: Xopenex: 2 puffs every 6 hours as needed Time spend: 79892 Code Status: full Family Communication: n/a Disposition Plan: Home when stable  Dorena Dew, MD  Pager [phone]  If 7PM-7AM, please contact night-coverage [email or web address] Password [email or web address] 06/28/2013, 2:16 PM

## 2013-06-28 NOTE — Telephone Encounter (Signed)
Called patient back and advised that I spoke with Dr. Jonelle Sidle.  Per Dr. Jonelle Sidle, the patient can come to [facility] for evaluation. I explained to patient that after his evaluation, he may want to send her to the ED due to her complaint of "cold like symptoms."  Patient verbalizes understanding.

## 2013-06-28 NOTE — Progress Notes (Signed)
   Subjective:    Patient ID: Cassie Montgomery, female    DOB: 1970/06/12, 43 y.o.   MRN: [id number]  Sickle Cell Pain Crisis The current episode started yesterday. The problem occurs constantly. The problem has been unchanged. Associated symptoms include fatigue, myalgias and nausea. Pertinent negatives include no abdominal pain, coughing, fever, headaches, joint swelling, neck pain, numbness, rash, sore throat or swollen glands. The symptoms are aggravated by walking. She has tried relaxation, oral narcotics and NSAIDs for the symptoms. The treatment provided mild relief.      Review of Systems  Constitutional: Positive for fatigue. Negative for fever.  HENT: Negative for sore throat.   Respiratory: Negative for cough.   Gastrointestinal: Positive for nausea. Negative for abdominal pain.  Musculoskeletal: Positive for myalgias. Negative for joint swelling and neck pain.  Skin: Negative for rash.  Neurological: Negative for numbness and headaches.       Objective:   Physical Exam  Constitutional: She appears well-developed and well-nourished.  HENT:  Head: Normocephalic.  Eyes: Conjunctivae and EOM are normal. Pupils are equal, round, and reactive to light.  Cardiovascular: Normal rate and regular rhythm.   Pulmonary/Chest: She has wheezes. She has no rales. She exhibits no tenderness.  Abdominal: Soft. Bowel sounds are normal.  Musculoskeletal:       Right knee: She exhibits decreased range of motion. She exhibits no swelling.       Left knee: She exhibits decreased range of motion. She exhibits no swelling.       Right ankle: She exhibits decreased range of motion. She exhibits no swelling. Tenderness.       Left ankle: She exhibits decreased range of motion. She exhibits no swelling. Tenderness.  Skin: Skin is warm and dry.          Assessment & Plan:  1. Sickle cell crisis with pain: Pt  treated with IVF, Toradol and IV analgesics. Pt was initially treated with weight  based rapid re-dosing and then transitioned to a weight based PCA Dilaudid. Pt will be transferred to inpatient unit. Patient unable to manage current pain level with previously prescribed analgesics and extended observation.  Current pain level prior to transfer 6/10, daily chronic pain typically 3/10.  2. Transfer to inpatient unit

## 2013-06-28 NOTE — Telephone Encounter (Signed)
Left message to follow up if patient was in fact coming in to the day hospital.  Advised her to give [place] a call back.

## 2013-06-28 NOTE — Telephone Encounter (Signed)
Patient C/O generalized pain and per patient she is having "cold like symptoms", with coughing up green mucous.  Patient does complain of chest pain, but denies that it radiates or travels down arm, or any shortness of breath.  Patient also states she has vomited x2, but hasnt taken anything because she doesn't have anything to take.  I explained I would notify the physician and give her a call back.  Patient verbalizes understanding.

## 2013-06-29 DIAGNOSIS — D57219 Sickle-cell/Hb-C disease with crisis, unspecified: Secondary | ICD-10-CM | POA: Diagnosis present

## 2013-06-29 DIAGNOSIS — F410 Panic disorder [episodic paroxysmal anxiety] without agoraphobia: Secondary | ICD-10-CM

## 2013-06-29 MED ORDER — OXYCODONE HCL 5 MG PO TABS
15.0000 mg | ORAL_TABLET | ORAL | Status: DC
  Administered 2013-06-29 – 2013-07-02 (×17): 15 mg via ORAL
  Filled 2013-06-29 (×16): qty 3

## 2013-06-29 MED ORDER — HYDROMORPHONE HCL PF 2 MG/ML IJ SOLN
2.0000 mg | INTRAMUSCULAR | Status: DC | PRN
  Administered 2013-06-29: 2 mg via INTRAVENOUS
  Filled 2013-06-29: qty 1

## 2013-06-29 MED ORDER — HYDROMORPHONE 0.3 MG/ML IV SOLN
INTRAVENOUS | Status: DC
  Administered 2013-06-29: 19:00:00 via INTRAVENOUS
  Administered 2013-06-29: 6.98 mg via INTRAVENOUS
  Administered 2013-06-30: 5.99 mg via INTRAVENOUS
  Administered 2013-06-30: 09:00:00 via INTRAVENOUS
  Administered 2013-06-30: 7.6 mg via INTRAVENOUS
  Administered 2013-06-30 (×2): via INTRAVENOUS
  Administered 2013-06-30: 6.99 mg via INTRAVENOUS
  Administered 2013-07-01 (×2): via INTRAVENOUS
  Administered 2013-07-01: 3.47 mg via INTRAVENOUS
  Administered 2013-07-01: 11.47 mg via INTRAVENOUS
  Administered 2013-07-01: 4.47 mg via INTRAVENOUS
  Administered 2013-07-01: 3.6 mg via INTRAVENOUS
  Administered 2013-07-01: 3.9 mg via INTRAVENOUS
  Administered 2013-07-01: 12.12 mg via INTRAVENOUS
  Administered 2013-07-01: 11:00:00 via INTRAVENOUS
  Administered 2013-07-02: 3.5 mg via INTRAVENOUS
  Administered 2013-07-02: 01:00:00 via INTRAVENOUS
  Filled 2013-06-29 (×11): qty 25

## 2013-06-29 MED ORDER — NALOXONE HCL 0.4 MG/ML IJ SOLN: 0.4000 mg | INTRAMUSCULAR | Status: DC | PRN

## 2013-06-29 MED ORDER — SODIUM CHLORIDE 0.9 % IJ SOLN: 9.0000 mL | INTRAMUSCULAR | Status: DC | PRN

## 2013-06-29 MED ORDER — DIPHENHYDRAMINE HCL 25 MG PO CAPS: 25.0000 mg | ORAL_CAPSULE | Freq: Four times a day (QID) | ORAL | Status: DC | PRN

## 2013-06-29 MED ORDER — ENOXAPARIN SODIUM 40 MG/0.4ML ~~LOC~~ SOLN
40.0000 mg | SUBCUTANEOUS | Status: DC
  Administered 2013-06-29 – 2013-07-01 (×3): 40 mg via SUBCUTANEOUS
  Filled 2013-06-29 (×4): qty 0.4

## 2013-06-29 MED ORDER — ONDANSETRON HCL 4 MG/2ML IJ SOLN
4.0000 mg | Freq: Four times a day (QID) | INTRAMUSCULAR | Status: DC | PRN
  Administered 2013-06-30 (×2): 4 mg via INTRAVENOUS
  Filled 2013-06-29 (×2): qty 2

## 2013-06-29 NOTE — Progress Notes (Signed)
Patient ID: Cassie Montgomery, female   DOB: 1970-09-25, 43 y.o.   MRN: [id number] Despite my recommendations about readiness for discharge, [name] insisted that she felt she was out of crisis and could manage her pain at home as she wanted to get home to her daughte. When she was about to be discharged, she told the nurse that she was still in pain and would not be able to manage at home. Discharge held and patient will be resumed on the PCA.

## 2013-06-29 NOTE — Progress Notes (Signed)
Patient seen and examined. Agree with notes by NP above.

## 2013-06-29 NOTE — Discharge Summary (Signed)
Cassie Montgomery MRN: [id number] DOB/AGE: May 17, 1970 43 y.o.  Admit date: 06/28/2013 Discharge date: 06/29/2013  Primary Care Physician:  MATTHEWS,MICHELLE A., MD   Discharge Diagnoses:   Patient Active Problem List   Diagnosis Date Noted  . Sickle-cell/Hb-C disease with crisis 06/29/2013  . Irregular menstrual cycle 05/12/2013  . Panic anxiety syndrome 05/11/2013  . Insomnia 05/11/2013  . Pap smear for cervical cancer screening 05/11/2013  . Immunization due 02/15/2013  . Orbital edema 02/15/2013  . Sickle cell disease, type [place] 02/15/2013  . Diarrhea 01/06/2013  . Nausea alone 01/06/2013  . Sickle cell hemoglobin C disease 01/06/2013  . Chronic pain syndrome 01/06/2013  . Abdominal bloating 01/06/2013  . Delirium, drug-induced 12/26/2012  . Narcotic respiratory distress 12/26/2012  . Depressive disorder, not elsewhere classified 10/20/2012  . Generalized anxiety disorder 10/20/2012  . Sickle cell crisis 12/07/2011  . Anemia 12/07/2011  . Leg pain 12/07/2011  . Thrombocytopenia 12/07/2011  . Sickle cell disease without crisis 07/15/2011    DISCHARGE MEDICATION:   Medication List         fluticasone 50 MCG/ACT nasal spray  Commonly known as:  FLONASE  Place 2 sprays into both nostrils daily.     folic acid 1 MG tablet  Commonly known as:  FOLVITE  Take 1 mg by mouth daily.     HYDROmorphone 4 MG tablet  Commonly known as:  DILAUDID  Take 1 tablet (4 mg total) by mouth every 4 (four) hours as needed for moderate pain or severe pain. For the next 48 hours then resume usual dose of Oxycodone 15 mg q 4 hours PRN     ibuprofen 800 MG tablet  Commonly known as:  ADVIL,MOTRIN  Take 1 tablet (800 mg total) by mouth every 8 (eight) hours as needed for pain.     levalbuterol 45 MCG/ACT inhaler  Commonly known as:  XOPENEX HFA  Inhale 2 puffs into the lungs 2 (two) times daily as needed for wheezing or shortness of breath.     LORazepam 1 MG tablet  Commonly known as:   ATIVAN  Take 1 tablet (1 mg total) by mouth every 6 (six) hours as needed for anxiety.     methadone 5 MG tablet  Commonly known as:  DOLOPHINE  Take 1 tablet (5 mg total) by mouth at bedtime.     omeprazole 40 MG capsule  Commonly known as:  PRILOSEC  Take 1 capsule (40 mg total) by mouth 2 (two) times daily.     Oxycodone HCl 10 MG Tabs  Take 1.5 tablets (15 mg total) by mouth every 6 (six) hours as needed (for pain).     Potassium Chloride ER 20 MEQ Tbcr  Take 20 mEq by mouth daily.     sertraline 50 MG tablet  Commonly known as:  ZOLOFT  Take 50 mg by mouth daily.     Vitamin D (Ergocalciferol) 50000 UNITS Caps capsule  Commonly known as:  DRISDOL  Take 1 capsule (50,000 Units total) by mouth every 7 (seven) days.     zolpidem 5 MG tablet  Commonly known as:  AMBIEN  Take 1 tablet (5 mg total) by mouth at bedtime as needed for sleep.          Consults:     SIGNIFICANT DIAGNOSTIC STUDIES:  Mm Digital Diagnostic Bilat  06/23/2013   CLINICAL DATA:  Patient has bilateral multiduct nonspontaneous milky nipple discharge. She states that she has had this for years. 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  EXAM: DIGITAL DIAGNOSTIC  BILATERAL MAMMOGRAM WITH CAD  COMPARISON:  10/16/2010  ACR Breast Density Category a: The breast tissue is almost entirely fatty.  FINDINGS: There is no suspicious dominant mass, architectural distortion, or calcification to suggest malignancy.  The type of discharge the patient describes is not a discharge of concern for carcinoma. One may wish to consider correlation with prolactin level.  Mammographic images were processed with CAD.  IMPRESSION: No mammographic evidence of malignancy.  RECOMMENDATION: Yearly screening mammography is suggested.  I have discussed the findings and recommendations with the patient. Results were also provided in writing at the conclusion of the visit. If applicable, a reminder letter will be sent to the  patient regarding the next appointment.  BI-RADS CATEGORY  1: Negative.   Electronically Signed   By: Ulyess Blossom M.D.   On: 06/23/2013 17:20     BRIEF ADMITTING H & P: Patient states that she is having pain to bilateral lower extremities and palmer aspect of feet that began early on 06/27/2013. Patient reports that pain is 8/10, throbbing, and constant. Pain was unrelieved by increasing Oxycodone to 20 mg on 06/27/2013. Oxycodone 10 mg was last taken on 06/28/2013 at 12:00 with minimal relief. Patient states that typically tolerated pain is 3/4.  Also, patient states that she has a history of controlled asthma. Patient states that she started wheezing on 06/28/2013 after going shopping. Patient used albuterol inhaler, which relieved wheezing    Hospital Course:  Present on Admission:  . Sickle-cell/Hb-C disease with crisis: Pt with Hb [place] presented to the [facility] yesterday with acute vaso-occlusive episode. She was treated however her pain was still poorly controlled and thus she was admitted to the Hospital. She continued management with Dilaudid PCA and then was transitioned to oral medications. She was controlled on her oral medications and felt that she could manage her pain at home.   . Depression with anxiety: Pt has a history of depression and a suspicion of PTSD. I have had several conversations with patient about pursuing Psychiatric and Counseling for her condition. She is alert and oriented x 3 and has again refused even after an episode that when described appears to be consistent with a psychotic episode vs. anxiety. She is neither homicidal or suicidal and has agreed to follow up in the office and accept referrals to both Psychiatry and Psychology as an out patient. Currently her thoughts are ordered and logical and her affect is normal. I will follow-up with referrals.   Disposition and Follow-up: Pt clinically stable on dischargeCall to schedule appointment for 1 week .     DISCHARGE EXAM:  General appearance: delirious, distracted and no distress  Vital Signs: BP 128/62, HR 84, T 97.6 F (36.4 C), temperature source Oral, RR 11, height 5\' 6"  (1.676 m), weight 234 lb 12.6 oz (106.5 kg), last menstrual period 06/23/2013, SpO2 99.00%. Eyes: conjunctivae/corneas clear. PERRL, EOM's intact.  Back: symmetric, no curvature. ROM normal. No CVA tenderness.  Resp: clear to auscultation bilaterally  Chest wall: no tenderness  Cardio: regular rate and rhythm, S1, S2 normal, no murmur, click, rub or gallop  GI: soft, non-tender; bowel sounds normal; no masses, no organomegaly  Extremities: extremities normal, atraumatic, no cyanosis or edema  Pulses: 2+ and symmetric  Skin: Skin color, texture, turgor normal. No rashes or lesions  Neurologic: Grossly normal     Recent Labs  06/28/13 1405  NA 139  K 4.1  CL 101  CO2 26  GLUCOSE 137*  BUN 8  CREATININE 0.75  CALCIUM 9.2    Recent Labs  06/28/13 1405  AST 49*  ALT 20  ALKPHOS 56  BILITOT 3.1*  PROT 7.0  ALBUMIN 3.6   No results found for this basename: LIPASE, AMYLASE,  in the last 72 hours  Recent Labs  06/28/13 1405  WBC 5.6  NEUTROABS 3.5  HGB 7.4*  HCT 21.9*  MCV 75.3*  PLT 137*   Total time spent in face to face contact and decision making was greater than 30 minutes. Signed: MATTHEWS,MICHELLE A. 06/29/2013, 12:29 PM

## 2013-06-29 NOTE — H&P (Signed)
Patient seen and examined. She has been [facility] but not better. Will admit the patient and continue her care as an inpatient.

## 2013-06-30 LAB — COMPREHENSIVE METABOLIC PANEL
ALT: 18 U/L (ref 0–35)
AST: 21 U/L (ref 0–37)
Albumin: 3.2 g/dL — ABNORMAL LOW (ref 3.5–5.2)
Alkaline Phosphatase: 62 U/L (ref 39–117)
BUN: 9 mg/dL (ref 6–23)
CO2: 28 mEq/L (ref 19–32)
Calcium: 8.3 mg/dL — ABNORMAL LOW (ref 8.4–10.5)
Chloride: 103 mEq/L (ref 96–112)
Creatinine, Ser: 0.74 mg/dL (ref 0.50–1.10)
GFR calc Af Amer: >90 mL/min (ref >90)
GFR calc non Af Amer: >90 mL/min (ref >90)
Glucose, Bld: 113 mg/dL — ABNORMAL HIGH (ref 70–99)
POTASSIUM: 4 meq/L (ref 3.7–5.3)
Sodium: 140 mEq/L (ref 137–147)
TOTAL PROTEIN: 6.4 g/dL (ref 6.0–8.3)
Total Bilirubin: 2.9 mg/dL — ABNORMAL HIGH (ref 0.3–1.2)

## 2013-06-30 LAB — CBC WITH DIFFERENTIAL/PLATELET
Basophils Absolute: 0 10*3/uL (ref 0.0–0.1)
Basophils Relative: 0 % (ref 0–1)
EOS ABS: 0.2 10*3/uL (ref 0.0–0.7)
EOS PCT: 3 % (ref 0–5)
HEMATOCRIT: 21.3 % — AB (ref 36.0–46.0)
HEMOGLOBIN: 7.2 g/dL — AB (ref 12.0–15.0)
LYMPHS ABS: 1.4 10*3/uL (ref 0.7–4.0)
Lymphocytes Relative: 23 % (ref 12–46)
MCH: 25.7 pg — AB (ref 26.0–34.0)
MCHC: 33.8 g/dL (ref 30.0–36.0)
MCV: 76.1 fL — AB (ref 78.0–100.0)
MONOS PCT: 8 % (ref 3–12)
Monocytes Absolute: 0.5 10*3/uL (ref 0.1–1.0)
NEUTROS PCT: 65 % (ref 43–77)
Neutro Abs: 3.9 10*3/uL (ref 1.7–7.7)
Platelets: 97 10*3/uL — ABNORMAL LOW (ref 150–400)
RBC: 2.8 MIL/uL — ABNORMAL LOW (ref 3.87–5.11)
RDW: 18.8 % — ABNORMAL HIGH (ref 11.5–15.5)
WBC: 6 10*3/uL (ref 4.0–10.5)

## 2013-06-30 MED ORDER — SENNOSIDES-DOCUSATE SODIUM 8.6-50 MG PO TABS
2.0000 | ORAL_TABLET | Freq: Once | ORAL | Status: AC
  Administered 2013-06-30: 2 via ORAL
  Filled 2013-06-30: qty 2

## 2013-06-30 MED ORDER — PHENOL 1.4 % MT LIQD
1.0000 | OROMUCOSAL | Status: DC | PRN
  Filled 2013-06-30: qty 177

## 2013-06-30 MED ORDER — POLYETHYLENE GLYCOL 3350 17 G PO PACK
17.0000 g | PACK | Freq: Every day | ORAL | Status: DC
  Administered 2013-06-30 – 2013-07-01 (×2): 17 g via ORAL
  Filled 2013-06-30 (×3): qty 1

## 2013-06-30 MED ORDER — FLUTICASONE PROPIONATE 50 MCG/ACT NA SUSP
2.0000 | Freq: Every day | NASAL | Status: DC
  Filled 2013-06-30: qty 16

## 2013-06-30 NOTE — Progress Notes (Signed)
SICKLE CELL SERVICE PROGRESS NOTE  Cassie Montgomery ZSW:[id number] DOB: 1971-01-06 DOA: 06/28/2013 PCP: Star Resler A., MD  Assessment/Plan: Active Problems:   Sickle-cell/Hb-C disease with crisis  1. Hb [place] with Crisis: Pt is here with simple acute pain likely of vaso-occlusive etiology with inflammatory component. Pt requested discharge yesterday as she felt that she could manage her pain at home. I felt that the patient was not ready for discharge however she had no acute derangements or objective findings that would compel a continued hospitalization. I felt that her desire for discharge was fueled by concern for her young daughter. However, after transitioning to her oral medications, it was clear to the patient that she could not manage her pain at home and a discharge at this time would result in her returning to the hospital. Thus her discharge was held. Today she rates her pain at 7/10 localized to her back and legs. I will continue her PCA at current dosing and re-evaluate her pain tomorrow. I anticipate another 48 hours of hospitalization especially with the current weather conditions which is a trigger for crisis for this patient. 2. Depression and anxiety. Pt with much better affect today. Will follow up as out patient.  Code Status: Full Code Family Communication: N/A Disposition Plan: Not yet ready for discharge  [place].  Pager [phone]. If 7PM-7AM, please contact night-coverage.  06/30/2013, 7:52 PM  LOS: 2 days   Brief narrative Pt with Hb [place] initially admitted to the [facility] for management of Simple acute pain of SCD. It was inadequately resolved by the time of discharge and she was thus transferred to the Hospital for further management.   Consultants:  None  Procedures:  None  Antibiotics:  None  HPI/Subjective: Pt rates her pain at 7/10 localized to her back and legs. She feels that thisis improved from yesterday. She has not had a BM since  admission.  Objective: Filed Vitals:   06/30/13 1109 06/30/13 1421 06/30/13 1436 06/30/13 1851  BP:  132/66    Pulse:  86    Temp:  98.4 F (36.9 C)    TempSrc:  Oral    Resp:  14 12 11   Height:      Weight:      SpO2: 98% 98% 98% 98%   Weight change:   Intake/Output Summary (Last 24 hours) at 06/30/13 1952 Last data filed at 06/30/13 1900  Gross per 24 hour  Intake 2606.54 ml  Output      0 ml  Net 2606.54 ml    General: Alert, awake, oriented x3, in no acute distress.  HEENT: [place]/AT PEERL, EOMI, anicteric. OROPHARYNX:  Moist, No exudate/ erythema/lesions.  Heart: Regular rate and rhythm, without murmurs, rubs, gallops.  Lungs: Clear to auscultation, no wheezing or rhonchi noted. Abdomen: Soft, nontender, nondistended, positive bowel sounds, no masses no hepatosplenomegaly noted.  Neuro: No focal neurological deficits noted cranial nerves II through XII grossly intact. Strength normal in bilateral upper and lower extremities. Musculoskeletal: No warm swelling or erythema around joints, no spinal tenderness noted. Psychiatric: Patient alert and oriented x3, good insight and cognition, good recent to remote recall.    Data Reviewed: Basic Metabolic Panel:  Recent Labs Lab 06/28/13 1405 06/30/13 0853  NA 139 140  K 4.1 4.0  CL 101 103  CO2 26 28  GLUCOSE 137* 113*  BUN 8 9  CREATININE 0.75 0.74  CALCIUM 9.2 8.3*   Liver Function Tests:  Recent Labs Lab 06/28/13 1405 06/30/13 2706  AST 49* 21  ALT 20 18  ALKPHOS 56 62  BILITOT 3.1* 2.9*  PROT 7.0 6.4  ALBUMIN 3.6 3.2*   No results found for this basename: LIPASE, AMYLASE,  in the last 168 hours No results found for this basename: AMMONIA,  in the last 168 hours CBC:  Recent Labs Lab 06/28/13 1405 06/30/13 0853  WBC 5.6 6.0  NEUTROABS 3.5 3.9  HGB 7.4* 7.2*  HCT 21.9* 21.3*  MCV 75.3* 76.1*  PLT 137* 97*   Cardiac Enzymes: No results found for this basename: CKTOTAL, CKMB, CKMBINDEX,  TROPONINI,  in the last 168 hours BNP (last 3 results) No results found for this basename: PROBNP,  in the last 8760 hours CBG: No results found for this basename: GLUCAP,  in the last 168 hours  No results found for this or any previous visit (from the past 240 hour(s)).   Studies: Mm Digital Diagnostic Bilat  06/23/2013   CLINICAL DATA:  Patient has bilateral multiduct nonspontaneous milky nipple discharge. She states that she has had this for years. 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  EXAM: DIGITAL DIAGNOSTIC  BILATERAL MAMMOGRAM WITH CAD  COMPARISON:  10/16/2010  ACR Breast Density Category a: The breast tissue is almost entirely fatty.  FINDINGS: There is no suspicious dominant mass, architectural distortion, or calcification to suggest malignancy.  The type of discharge the patient describes is not a discharge of concern for carcinoma. One may wish to consider correlation with prolactin level.  Mammographic images were processed with CAD.  IMPRESSION: No mammographic evidence of malignancy.  RECOMMENDATION: Yearly screening mammography is suggested.  I have discussed the findings and recommendations with the patient. Results were also provided in writing at the conclusion of the visit. If applicable, a reminder letter will be sent to the patient regarding the next appointment.  BI-RADS CATEGORY  1: Negative.   Electronically Signed   By: Ulyess Blossom M.D.   On: 06/23/2013 17:20    Scheduled Meds: . enoxaparin (LOVENOX) injection  40 mg Subcutaneous Q24H  . fluticasone  2 spray Each Nare Daily  . folic acid  1 mg Oral Daily  . HYDROmorphone PCA 0.3 mg/mL   Intravenous 6 times per day  . ketorolac  30 mg Intravenous 4 times per day  . methadone  5 mg Oral QHS  . oxyCODONE  15 mg Oral Q4H  . pantoprazole  40 mg Oral Daily  . polyethylene glycol  17 g Oral Daily  . sertraline  50 mg Oral Daily  . [START ON 07/04/2013] Vitamin D (Ergocalciferol)  50,000 Units Oral  Q7 days   Continuous Infusions: . dextrose 5 % and 0.45% NaCl 100 mL (06/30/13 1717)    Active Problems:   Sickle-cell/Hb-C disease with crisis

## 2013-06-30 NOTE — Progress Notes (Signed)
Pt had episode of emesis x1. There was a scant amount of blood in emesis. Pt c/o throat burning due to emesis. VSS. Zofran 4mg  given to patient. Schorr, NP notified via text page. Will continue to monitor.

## 2013-07-01 NOTE — Telephone Encounter (Signed)
Note routed to MD

## 2013-07-02 DIAGNOSIS — D649 Anemia, unspecified: Secondary | ICD-10-CM

## 2013-07-02 DIAGNOSIS — F411 Generalized anxiety disorder: Secondary | ICD-10-CM

## 2013-07-02 DIAGNOSIS — G8929 Other chronic pain: Secondary | ICD-10-CM

## 2013-07-02 DIAGNOSIS — F3289 Other specified depressive episodes: Secondary | ICD-10-CM

## 2013-07-02 DIAGNOSIS — D57219 Sickle-cell/Hb-C disease with crisis, unspecified: Secondary | ICD-10-CM

## 2013-07-02 DIAGNOSIS — F329 Major depressive disorder, single episode, unspecified: Secondary | ICD-10-CM

## 2013-07-02 LAB — EXPECTORATED SPUTUM ASSESSMENT W GRAM STAIN, RFLX TO RESP C

## 2013-07-02 LAB — EXPECTORATED SPUTUM ASSESSMENT W REFEX TO RESP CULTURE

## 2013-07-02 MED ORDER — HYDROMORPHONE HCL 4 MG PO TABS
4.0000 mg | ORAL_TABLET | ORAL | Status: AC
  Administered 2013-07-02: 4 mg via ORAL
  Filled 2013-07-02: qty 1

## 2013-07-02 MED ORDER — HYDROMORPHONE HCL 4 MG PO TABS: 4.0000 mg | ORAL_TABLET | ORAL | Status: DC | PRN

## 2013-07-02 MED ORDER — LORAZEPAM 1 MG PO TABS
1.0000 mg | ORAL_TABLET | ORAL | Status: AC
  Administered 2013-07-02: 1 mg via ORAL
  Filled 2013-07-02: qty 1

## 2013-07-02 MED ORDER — OXYCODONE HCL 10 MG PO TABS: 15.0000 mg | ORAL_TABLET | Freq: Four times a day (QID) | ORAL | Status: DC | PRN

## 2013-07-02 MED ORDER — GUAIFENESIN-DM 100-10 MG/5ML PO SYRP
5.0000 mL | ORAL_SOLUTION | ORAL | Status: DC | PRN
  Administered 2013-07-02: 5 mL via ORAL
  Filled 2013-07-02: qty 10

## 2013-07-02 NOTE — Discharge Summary (Signed)
PHYSICIAN DISCHARGE SUMMARY   Cassie Montgomery OFB:[id number] DOB: 07/15/1970 DOA: 06/28/2013  PCP: MATTHEWS,MICHELLE A., MD  Admit date: 06/28/2013 Discharge date: 07/02/2013  Time spent: 39 minutes  Recommendations for Outpatient Follow-up:  1. Outpatient referral for psychiatry services and counseling services  2. Check CBC and BMP on outpatient followup   Discharge Diagnoses:  Active Problems:   Sickle-cell/Hb-C disease with crisis  Discharge Condition: stable, eating and drinking well, pain controlled, ambulating well  Diet recommendation: regular diet  Filed Weights   06/28/13 1835  Weight: 234 lb 12.6 oz (106.5 kg)   History of present illness:  Patient states that she is having pain to bilateral lower extremities and palmer aspect of feet that began early on 06/27/2013. Patient reports that pain is 8/10, throbbing, and constant. Pain was unrelieved by increasing Oxycodone to 20 mg on 06/27/2013. Oxycodone 10 mg was last taken on 06/28/2013 at 12:00 with minimal relief. Patient states that typically tolerated pain is 3/4.  Also, patient states that she has a history of controlled asthma. Patient states that she started wheezing on 06/28/2013 after going shopping. Patient used albuterol inhaler, which relieved wheezing  Hospital Course:  . Sickle-cell/Hb-C disease with crisis: Pt with Hb [place] presented to the [facility] yesterday with acute vaso-occlusive episode. She was treated however her pain was still poorly controlled and thus she was admitted to the Hospital. She continued management with Dilaudid PCA and then was transitioned to oral medications. She was controlled on her oral medications and felt that she could manage her pain at home.  She is being discharged home with recommendations for close outpatient followup with her PCP.  Pt is eating and drinking well, ambulating well, Pt reports that her pain is controlled.  She reported that she did well when she was discharged back in  December with oral dilaudid to use for 48 hours and then resuming home oxycodone as needed for breakthrough pain. She will continue her long - acting medication as prescribed.  She was told that she could not combine dilaudid and oxycodone and/or take them together. She verbalized understanding.     . Depression with anxiety: Pt has a history of depression and a suspicion of PTSD. I have had several conversations with patient about pursuing Psychiatric and Counseling for her condition. She is alert and oriented x 3 and has again refused even after an episode that when described appears to be consistent with a psychotic episode vs. anxiety. She is neither homicidal or suicidal and has agreed to follow up in the office and accept referrals to both Psychiatry and Psychology as an out patient. Currently her thoughts are ordered and logical and her affect is normal. I will follow-up with referrals.  Discharge Exam: Pt is reporting that she wants to go home today.  She has been eating and ambulating.  She is taking her oral meds.  She is in no distress.  Her pain was 5/10 this morning.  She reports that she plans to followup with Dr. Zigmund Daniel next week.    Filed Vitals:   07/02/13 0809  BP:   Pulse:   Temp:   Resp: 16   General: awake, alert, no apparent distress Cardiovascular: normal s1, s2 sounds  Respiratory: BBS clear to auscultation Abd: soft, nondistender nontender, no masses Ext: no CCE  Neuro: nonfocal   Discharge Instructions  Discharge Orders   Future Orders Complete By Expires   Activity as tolerated - No restrictions  As directed  Diet general  As directed    Discharge instructions  As directed    Comments:     Use dilaudid as needed for next 48 hours and then resume home oxycodone as needed for breakthrough pain Return if symptoms recur, worsen or new problems develop.   Discontinue IV  As directed    Increase activity slowly  As directed        Medication List          fluticasone 50 MCG/ACT nasal spray  Commonly known as:  FLONASE  Place 2 sprays into both nostrils daily.     folic acid 1 MG tablet  Commonly known as:  FOLVITE  Take 1 mg by mouth daily.     HYDROmorphone 4 MG tablet  Commonly known as:  DILAUDID  Take 1 tablet (4 mg total) by mouth every 4 (four) hours as needed for severe pain (Take for next 48 hours as needed then resume oxycodone as needed).     ibuprofen 800 MG tablet  Commonly known as:  ADVIL,MOTRIN  Take 1 tablet (800 mg total) by mouth every 8 (eight) hours as needed for pain.     levalbuterol 45 MCG/ACT inhaler  Commonly known as:  XOPENEX HFA  Inhale 2 puffs into the lungs 2 (two) times daily as needed for wheezing or shortness of breath.     LORazepam 1 MG tablet  Commonly known as:  ATIVAN  Take 1 tablet (1 mg total) by mouth every 6 (six) hours as needed for anxiety.     methadone 5 MG tablet  Commonly known as:  DOLOPHINE  Take 1 tablet (5 mg total) by mouth at bedtime.     omeprazole 40 MG capsule  Commonly known as:  PRILOSEC  Take 1 capsule (40 mg total) by mouth 2 (two) times daily.     Oxycodone HCl 10 MG Tabs  Take 1.5 tablets (15 mg total) by mouth every 6 (six) hours as needed (for pain).  Start taking on:  07/05/2013     Potassium Chloride ER 20 MEQ Tbcr  Take 20 mEq by mouth daily.     sertraline 50 MG tablet  Commonly known as:  ZOLOFT  Take 50 mg by mouth daily.     Vitamin D (Ergocalciferol) 50000 UNITS Caps capsule  Commonly known as:  DRISDOL  Take 1 capsule (50,000 Units total) by mouth every 7 (seven) days.     zolpidem 5 MG tablet  Commonly known as:  AMBIEN  Take 1 tablet (5 mg total) by mouth at bedtime as needed for sleep.       Allergies  Allergen Reactions  . Sulfa Antibiotics Other (See Comments)    Increased bilirubin       Follow-up Information   Follow up with MATTHEWS,MICHELLE A., MD. Schedule an appointment as soon as possible for a visit in 1 week. (Referrals  as discussed and post-hospital visit)    Specialty:  Internal Medicine   Contact information:   [address] [place] [address] [phone]      The results of significant diagnostics from this hospitalization (including imaging, microbiology, ancillary and laboratory) are listed below for reference.    Significant Diagnostic Studies: Mm Digital Diagnostic Bilat  06/23/2013   CLINICAL DATA:  Patient has bilateral multiduct nonspontaneous milky nipple discharge. She states that she has had this for years. 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  EXAM: DIGITAL DIAGNOSTIC  BILATERAL MAMMOGRAM  WITH CAD  COMPARISON:  10/16/2010  ACR Breast Density Category a: The breast tissue is almost entirely fatty.  FINDINGS: There is no suspicious dominant mass, architectural distortion, or calcification to suggest malignancy.  The type of discharge the patient describes is not a discharge of concern for carcinoma. One may wish to consider correlation with prolactin level.  Mammographic images were processed with CAD.  IMPRESSION: No mammographic evidence of malignancy.  RECOMMENDATION: Yearly screening mammography is suggested.  I have discussed the findings and recommendations with the patient. Results were also provided in writing at the conclusion of the visit. If applicable, a reminder letter will be sent to the patient regarding the next appointment.  BI-RADS CATEGORY  1: Negative.   Electronically Signed   By: Ulyess Blossom M.D.   On: 06/23/2013 17:20   Microbiology: No results found for this or any previous visit (from the past 240 hour(s)).   Labs: Basic Metabolic Panel:  Recent Labs Lab 06/28/13 1405 06/30/13 0853  NA 139 140  K 4.1 4.0  CL 101 103  CO2 26 28  GLUCOSE 137* 113*  BUN 8 9  CREATININE 0.75 0.74  CALCIUM 9.2 8.3*   Liver Function Tests:  Recent Labs Lab 06/28/13 1405 06/30/13 0853  AST 49* 21  ALT 20 18  ALKPHOS 56 62  BILITOT  3.1* 2.9*  PROT 7.0 6.4  ALBUMIN 3.6 3.2*   No results found for this basename: LIPASE, AMYLASE,  in the last 168 hours No results found for this basename: AMMONIA,  in the last 168 hours CBC:  Recent Labs Lab 06/28/13 1405 06/30/13 0853  WBC 5.6 6.0  NEUTROABS 3.5 3.9  HGB 7.4* 7.2*  HCT 21.9* 21.3*  MCV 75.3* 76.1*  PLT 137* 97*   Cardiac Enzymes: No results found for this basename: CKTOTAL, CKMB, CKMBINDEX, TROPONINI,  in the last 168 hours BNP: BNP (last 3 results) No results found for this basename: PROBNP,  in the last 8760 hours CBG: No results found for this basename: GLUCAP,  in the last 168 hours  Signed:  Clanford Johnson  [facility] 07/02/2013, 9:23 AM

## 2013-07-02 NOTE — Progress Notes (Signed)
Patient medically stable. DC'd to home.

## 2013-07-02 NOTE — Discharge Instructions (Signed)
Sickle Cell Anemia, Adult Sickle cell anemia is a condition where your red blood cells are shaped like sickles. Red blood cells carry oxygen through the body. Sickle-shaped red blood cells cells do not live as long as normal red blood cells. They also clump together and block blood from flowing through the blood vessels. These things prevent the body from getting enough oxygen. Sickle cell anemia causes organ damage and pain. It also increases the risk of infection. HOME CARE  Drink enough fluid to keep your pee (urine) clear or pale yellow. Drink more in hot weather and during exercise.  Do not smoke. Smoking lowers oxygen levels in the blood.  Only take over-the-counter or prescription medicines as told by your doctor.  Take antibiotic medicines as told by your doctor. Make sure you finish them it even if you start to feel better.  Take supplements as told by your doctor.  Consider wearing a medical alert bracelet. This tells anyone caring for you in an emergency of your condition.  When traveling, keep your medical information, doctors' names, and the medicines you take with you at all times.  If you have a fever, do not take fever medicines right away. This could cover up a problem. Tell your doctor.   Keep all follow-up appointments with your doctor. Sickle cell anemia requires regular medical care. GET HELP IF: You have a fever. GET HELP RIGHT AWAY IF:  You feel dizzy or faint.  You have new belly (abdominal) pain, especially on the left side near the stomach area.  You have a lasting, often uncomfortable and painful erection of the penis (priapism). If it is not treated right away, you will become unable to have sex (impotence).  You have numbness your arms or legs or you have a hard time moving them.  You have a hard time talking.  You have a fever or lasting symptoms for more than 2 3 days.  You have a fever and your symptoms suddenly get worse.  You have signs or  symptoms of infection. These include:  Chills.  Being more tired than normal (lethargy).  Irritability.  Poor eating.  Throwing up (vomiting).  You have pain that is not helped with medicine.  You have shortness of breath.  You have pain in your chest.  You are coughing up pus-like or bloody mucus.  You have a stiff neck.  Your feet or hands swell or have pain.  Your belly looks bloated.  Your joints hurt. MAKE SURE YOU:  Understand these instructions.  Will watch your condition.  Will get help right away if you are not doing well or get worse. Document Released: 02/17/2013 Document Reviewed: 12/09/2012 [facility] Patient Information 2014 [place].  Pain Medicine Instructions Pain medicines may change the way you think. You might not be able to do some things safely. Take medicine as told by your doctor for pain. Do not take the medicine if you do not have pain, unless your doctor tells you to. You can take less medicine if a smaller amount makes your pain go away. It may not be possible to make all of your pain go away. However, you should be comfortable enough to move, breathe, and take care of yourself. After you start the medicine, and for 8 hours after you stop, do not:  Drive.  Use machines.  Use power tools.  Sign legal papers.  Take care of children by yourself.  Climb or go to high places.  Swim without an  adult nearby to help you. HOME CARE   Do not drink alcohol until 8 hours after you stop taking pain medicines.  Do not take sleeping pills until 8 hours after you stop taking pain medicines.  Do not take other medicines until 8 hours after you stop taking pain medicines, unless your doctor says it is okay.  Take a medicine to help you go poop (stool softener) if you cannot (constipated). Eating more fruits and vegetables can also help you poop.  Write down when you take your medicine. You may get confused when taking pain medicine.  Writing down the times will help you to not take too much medicine (overdose). GET HELP RIGHT AWAY IF:   You feel dizzy or pass out (faint).  You feel there are other problems that might be caused by your medicine.  Your medicine is not helping the pain go away.  You throw up (vomit) or have watery poop (diarrhea).  You have pain in an area that did not hurt before. MAKE SURE YOU:   Understand these instructions.  Will watch your condition.  Will get help right away if you are not doing well or get worse. Document Released: 10/16/2007 Document Revised: 07/22/2011 Document Reviewed: 04/13/2010 [facility] Patient Information 2014 [place], [place].  Chronic Pain Chronic pain can be defined as pain that is off and on and lasts for 3 6 months or longer. Many things cause chronic pain, which can make it difficult to make a diagnosis. There are many treatment options available for chronic pain. However, finding a treatment that works well for you may require trying various approaches until the right one is found. Many people benefit from a combination of two or more types of treatment to control their pain. SYMPTOMS  Chronic pain can occur anywhere in the body and can range from mild to very severe. Some types of chronic pain include:  Headache.  Low back pain.  Cancer pain.  Arthritis pain.  Neurogenic pain. This is pain resulting from damage to nerves. People with chronic pain may also have other symptoms such as:  Depression.  Anger.  Insomnia.  Anxiety. DIAGNOSIS  Your health care provider will help diagnose your condition over time. In many cases, the initial focus will be on excluding possible conditions that could be causing the pain. Depending on your symptoms, your health care provider may order tests to diagnose your condition. Some of these tests may include:   Blood tests.   CT scan.   MRI.   X-rays.   Ultrasounds.   Nerve conduction studies.  You  may need to see a specialist.  TREATMENT  Finding treatment that works well may take time. You may be referred to a pain specialist. He or she may prescribe medicine or therapies, such as:   Mindful meditation or yoga.  Shots (injections) of numbing or pain-relieving medicines into the spine or area of pain.  Local electrical stimulation.  Acupuncture.   Massage therapy.   Aroma, color, light, or sound therapy.   Biofeedback.   Working with a physical therapist to keep from getting stiff.   Regular, gentle exercise.   Cognitive or behavioral therapy.   Group support.  Sometimes, surgery may be recommended.  HOME CARE INSTRUCTIONS   Take all medicines as directed by your health care provider.   Lessen stress in your life by relaxing and doing things such as listening to calming music.   Exercise or be active as directed by your health care  provider.   Eat a healthy diet and include things such as vegetables, fruits, fish, and lean meats in your diet.   Keep all follow-up appointments with your health care provider.   Attend a support group with others suffering from chronic pain. SEEK MEDICAL CARE IF:   Your pain gets worse.   You develop a new pain that was not there before.   You cannot tolerate medicines given to you by your health care provider.   You have new symptoms since your last visit with your health care provider.  SEEK IMMEDIATE MEDICAL CARE IF:   You feel weak.   You have decreased sensation or numbness.   You lose control of bowel or bladder function.   Your pain suddenly gets much worse.   You develop shaking.  You develop chills.  You develop confusion.  You develop chest pain.  You develop shortness of breath.  MAKE SURE YOU:  Understand these instructions.  Will watch your condition.  Will get help right away if you are not doing well or get worse. Document Released: 01/19/2002 Document Revised:  12/30/2012 Document Reviewed: 10/23/2012 [facility] Patient Information 2014 [place].

## 2013-07-04 LAB — CULTURE, RESPIRATORY W GRAM STAIN: Culture: NORMAL

## 2013-07-04 LAB — CULTURE, RESPIRATORY

## 2013-07-05 ENCOUNTER — Telehealth: Payer: Self-pay | Admitting: Internal Medicine

## 2013-07-05 DIAGNOSIS — F411 Generalized anxiety disorder: Secondary | ICD-10-CM

## 2013-07-05 MED ORDER — LORAZEPAM 1 MG PO TABS: 1.0000 mg | ORAL_TABLET | Freq: Three times a day (TID) | ORAL | Status: DC | PRN

## 2013-07-05 NOTE — Telephone Encounter (Signed)
Prescription re-filled for Ativan. Oxycodone not due for refill until 07/19/2013 given patient's hospitalization from 2/16- 2/20.

## 2013-07-05 NOTE — Telephone Encounter (Signed)
Refill request for LORazepam (ATIVAN) 1 MG tablet, Oxycodone HCl 10 MG TABS

## 2013-07-07 ENCOUNTER — Telehealth: Payer: Self-pay | Admitting: Internal Medicine

## 2013-07-07 ENCOUNTER — Telehealth: Payer: Self-pay | Admitting: Hematology

## 2013-07-07 DIAGNOSIS — G8929 Other chronic pain: Secondary | ICD-10-CM

## 2013-07-07 MED ORDER — OXYCODONE HCL 10 MG PO TABS: 15.0000 mg | ORAL_TABLET | Freq: Four times a day (QID) | ORAL | Status: DC | PRN

## 2013-07-07 NOTE — Telephone Encounter (Signed)
Patient called regarding early pick up of RX due to the threat the practice being closed due to inclement weather on 07/08/13, her scheduled appointment date.

## 2013-07-07 NOTE — Telephone Encounter (Signed)
Patient is requesting Oxycodone be refilled.  I explained to her the previous note about this prescription, and patient states that prior to her hospitalization she took extra because she was trying to prevent a crisis.  Patient admits that she didn't call in to have it documented that she was going to take more than prescribed.  Patient states when she was discharged, she only received a short supply of hydromorphone.

## 2013-07-07 NOTE — Telephone Encounter (Signed)
Pt called requesting Oxycodone which is not due until 07/14/2013. I queried patient as to why she was in need of medication before it was due. Pt informed that on her last crisis she was trying to prevent hospitalization and thus she took more medication that was prescribed without calling to confer with Provider. She also states that she reported this to the nurses when she arrived. I have informed patient on multiple occasions that she is not to self-adjust or self prescribe her medications. I have also informed her that in the event that she has an increase in her pain not controlled by her usual prescription she is to call and speak with a provider or Provider's representative. She is also aware that if this occurs on another occasion, that this practice will no longer be prescribing opiate prescriptions for her. Prescription given for Oxycodone 10 mg #90 mg tabs.

## 2013-07-08 ENCOUNTER — Ambulatory Visit: Payer: Medicare Other | Admitting: Family Medicine

## 2013-07-09 ENCOUNTER — Telehealth: Payer: Self-pay | Admitting: Internal Medicine

## 2013-07-09 NOTE — Telephone Encounter (Signed)
Left message for patient to reschedule appointment cancelled due to inclement weather.

## 2013-07-12 ENCOUNTER — Telehealth: Payer: Self-pay | Admitting: Internal Medicine

## 2013-07-12 DIAGNOSIS — D5720 Sickle-cell/Hb-C disease without crisis: Secondary | ICD-10-CM

## 2013-07-12 DIAGNOSIS — G894 Chronic pain syndrome: Secondary | ICD-10-CM

## 2013-07-14 ENCOUNTER — Other Ambulatory Visit: Payer: Self-pay | Admitting: Internal Medicine

## 2013-07-14 ENCOUNTER — Telehealth (HOSPITAL_COMMUNITY): Payer: Self-pay | Admitting: *Deleted

## 2013-07-14 ENCOUNTER — Ambulatory Visit: Payer: Medicare Other | Admitting: Family Medicine

## 2013-07-14 ENCOUNTER — Telehealth: Payer: Self-pay

## 2013-07-14 ENCOUNTER — Other Ambulatory Visit (HOSPITAL_COMMUNITY): Payer: Self-pay | Admitting: *Deleted

## 2013-07-14 MED ORDER — METHADONE HCL 5 MG PO TABS: 5.0000 mg | ORAL_TABLET | Freq: Every day | ORAL | Status: DC

## 2013-07-14 NOTE — Telephone Encounter (Signed)
Unable to authorize electronically

## 2013-07-14 NOTE — Telephone Encounter (Signed)
Prescription written for Methadone 5 mg # 30 tabs.

## 2013-07-14 NOTE — Telephone Encounter (Signed)
Medication refill for [employer]

## 2013-07-15 ENCOUNTER — Telehealth: Payer: Self-pay

## 2013-07-16 NOTE — Telephone Encounter (Signed)
Pt was Advised on Picking up med that she requested on tues & Friday only.

## 2013-07-19 NOTE — Telephone Encounter (Signed)
07/14/2013 11:49 AM Phone (Outgoing) Baljit, Liebert (Self) [phone] (H) Pt is requesting a refill on her Ambien 5 mg

## 2013-07-19 NOTE — Telephone Encounter (Signed)
Spoke with Ms. Yaw and informed her of results of trans-vaginal U/S. Small Uterine fibroid

## 2013-07-21 ENCOUNTER — Telehealth: Payer: Self-pay | Admitting: Internal Medicine

## 2013-07-21 DIAGNOSIS — G8929 Other chronic pain: Secondary | ICD-10-CM

## 2013-07-21 DIAGNOSIS — G4700 Insomnia, unspecified: Secondary | ICD-10-CM

## 2013-07-21 NOTE — Telephone Encounter (Signed)
Prescription request for Oxycodone HCl 10 MG TABS, zolpidem (AMBIEN) 5 MG tablet.  Last office visit 06/24/13

## 2013-07-21 NOTE — Telephone Encounter (Signed)
07/14/2013 11:46 AM Phone (Outgoing) [name], [name] (Self) [phone] (H)    Completed- Pt was contacted today and was requested a refill on her Ambien.Pt has reset her Appt. to 08/12/2013 w/ Dr. Zigmund Daniel.

## 2013-07-23 ENCOUNTER — Other Ambulatory Visit: Payer: Self-pay | Admitting: Internal Medicine

## 2013-07-23 MED ORDER — ZOLPIDEM TARTRATE 5 MG PO TABS: 5.0000 mg | ORAL_TABLET | Freq: Every evening | ORAL | Status: DC | PRN

## 2013-07-23 MED ORDER — OXYCODONE HCL 10 MG PO TABS: 15.0000 mg | ORAL_TABLET | Freq: Four times a day (QID) | ORAL | Status: DC | PRN

## 2013-07-23 NOTE — Telephone Encounter (Signed)
Prescription re-filled for ambien 5 mg # 30 and Oxycodone 10 mg #90.

## 2013-08-05 ENCOUNTER — Telehealth: Payer: Self-pay | Admitting: Internal Medicine

## 2013-08-05 DIAGNOSIS — G8929 Other chronic pain: Secondary | ICD-10-CM

## 2013-08-05 MED ORDER — OXYCODONE HCL 10 MG PO TABS: 15.0000 mg | ORAL_TABLET | Freq: Four times a day (QID) | ORAL | Status: DC | PRN

## 2013-08-05 NOTE — Telephone Encounter (Signed)
Oxycodone 10 mg. Take 1.5 tablets every 6 hours as needed for pain, #90. Rx is not to be filled before 08/07/2013

## 2013-08-12 ENCOUNTER — Ambulatory Visit (INDEPENDENT_AMBULATORY_CARE_PROVIDER_SITE_OTHER): Payer: Medicare Other | Admitting: Internal Medicine

## 2013-08-12 ENCOUNTER — Encounter: Payer: Self-pay | Admitting: Internal Medicine

## 2013-08-12 VITALS — BP 128/82 | HR 97 | Temp 97.8°F | Resp 20 | Ht 68.0 in | Wt 235.0 lb

## 2013-08-12 DIAGNOSIS — D5720 Sickle-cell/Hb-C disease without crisis: Secondary | ICD-10-CM | POA: Diagnosis not present

## 2013-08-12 DIAGNOSIS — F329 Major depressive disorder, single episode, unspecified: Secondary | ICD-10-CM | POA: Diagnosis not present

## 2013-08-12 DIAGNOSIS — F3289 Other specified depressive episodes: Secondary | ICD-10-CM

## 2013-08-12 DIAGNOSIS — F410 Panic disorder [episodic paroxysmal anxiety] without agoraphobia: Secondary | ICD-10-CM | POA: Diagnosis not present

## 2013-08-12 DIAGNOSIS — F32A Depression, unspecified: Secondary | ICD-10-CM

## 2013-08-12 MED ORDER — FOLIC ACID 1 MG PO TABS: 1.0000 mg | ORAL_TABLET | Freq: Every day | ORAL | Status: DC

## 2013-08-12 MED ORDER — LEVALBUTEROL TARTRATE 45 MCG/ACT IN AERO: 2.0000 | INHALATION_SPRAY | Freq: Two times a day (BID) | RESPIRATORY_TRACT | Status: DC | PRN

## 2013-08-12 MED ORDER — MORPHINE SULFATE 15 MG PO TABS: 15.0000 mg | ORAL_TABLET | ORAL | Status: DC | PRN

## 2013-08-12 NOTE — Progress Notes (Signed)
   Subjective:    Patient ID: Cassie Montgomery, female    DOB: 1971/02/13, 43 y.o.   MRN: [id number]  HPI:     Review of Systems     Objective:   Physical Exam        Assessment & Plan:  1. Hb [place]: Continue  We discussed the need for good hydration, monitoring of hydration status, avoidance of heat, cold, stress, and infection triggers. The patient was reminded of the need to seek medical attention of any symptoms of bleeding, anemia, or infection. Continue folic acid 1 mg daily to prevent aplastic bone marrow crises.   -Pulmonary evaluation - Patient denies severe recurrent wheezes, shortness of breath with exercise, or persistent cough. If these symptoms develop, pulmonary function tests with spirometry will be ordered, and if abnormal, plan on referral to Pulmonology for further evaluation.  -Cardiac - Routine screening for pulmonary hypertension is not recommended.  -Eye - High risk of proliferative retinopathy.Has not had annual eye exam with retinal exam for 2 years. Will refer. recommended to patient.  -Immunization status - Yearly influenza vaccination is recommended, as well as being up to date with Meningococcal and Pneumococcal vaccines.   - Acute and chronic painful episodes -  Will change to MS IR 15 mg q 6 hours PRN. We agreed on MS IR 15mg  #90 pills  per month.  We discussed that she is to receive her Schedule II prescriptions only from [place]. She is also aware that her prescription history is available to [place] online through the [place]. Controlled substance agreement signed (Date). We reminded (Pt) that all patients receiving Schedule II narcotics must be seen for follow up every three months. We reviewed the terms of our pain agreement, including the need to keep medicines in a safe locked location away from children or pets, and the need to report excess sedation or constipation, measures to avoid constipation, and policies related to early refills and stolen prescriptions.  According to the [place] [facility] program, we have reviewed details related to analgesia, adverse effects, aberrant behaviors.  - Iron overload from chronic transfusion.  Pt has Ferritin <1000. No indication for Exjade.  -Vitamin D deficiency - Drisdol 50,000 units weekly, we encouraged her to take it.  The above recommendations are taken from the NIH Evidence-Based Management of Sickle Cell Disease: Expert Panel Report, 20149.   2. Vitamin D Deficiency: Continue Drisdol.  3. Panic: Refer to Dr. Hart Carwin.  RTC:

## 2013-08-13 ENCOUNTER — Encounter: Payer: Self-pay | Admitting: Internal Medicine

## 2013-08-19 ENCOUNTER — Other Ambulatory Visit: Payer: Self-pay | Admitting: Internal Medicine

## 2013-08-19 DIAGNOSIS — J45909 Unspecified asthma, uncomplicated: Secondary | ICD-10-CM

## 2013-08-19 DIAGNOSIS — D5720 Sickle-cell/Hb-C disease without crisis: Secondary | ICD-10-CM

## 2013-08-19 MED ORDER — ALBUTEROL SULFATE HFA 108 (90 BASE) MCG/ACT IN AERS: 2.0000 | INHALATION_SPRAY | Freq: Four times a day (QID) | RESPIRATORY_TRACT | Status: DC | PRN

## 2013-08-19 NOTE — Progress Notes (Signed)
Xopenex no longer covered by patient's insurance. Pro-Air substituted and prescription routed to Pharmacy.

## 2013-08-23 ENCOUNTER — Telehealth: Payer: Self-pay | Admitting: Internal Medicine

## 2013-08-23 ENCOUNTER — Other Ambulatory Visit (HOSPITAL_COMMUNITY): Payer: Self-pay | Admitting: *Deleted

## 2013-08-23 DIAGNOSIS — G894 Chronic pain syndrome: Secondary | ICD-10-CM

## 2013-08-23 DIAGNOSIS — D5720 Sickle-cell/Hb-C disease without crisis: Secondary | ICD-10-CM

## 2013-08-23 MED ORDER — SERTRALINE HCL 50 MG PO TABS: 50.0000 mg | ORAL_TABLET | Freq: Every day | ORAL | Status: DC

## 2013-08-23 MED ORDER — METHADONE HCL 5 MG PO TABS
5.0000 mg | ORAL_TABLET | Freq: Every day | ORAL | Status: DC
Start: 2013-08-23 — End: 2013-09-16

## 2013-08-23 NOTE — Telephone Encounter (Signed)
Escribed zoloft #30 with 2 refills, Methadone refill request routed to provider

## 2013-08-23 NOTE — Telephone Encounter (Signed)
Prescription filled for Methadone 5 mg #30 

## 2013-08-25 ENCOUNTER — Telehealth: Payer: Self-pay | Admitting: Internal Medicine

## 2013-08-25 DIAGNOSIS — D5720 Sickle-cell/Hb-C disease without crisis: Secondary | ICD-10-CM

## 2013-08-26 MED ORDER — MORPHINE SULFATE 15 MG PO TABS
15.0000 mg | ORAL_TABLET | ORAL | Status: DC | PRN
Start: 2013-08-26 — End: 2013-09-10

## 2013-08-26 NOTE — Telephone Encounter (Signed)
Prescription refilled for MS IR 15 mg # 90 tabs.

## 2013-08-27 ENCOUNTER — Encounter: Payer: Self-pay | Admitting: Family Medicine

## 2013-08-27 ENCOUNTER — Ambulatory Visit (INDEPENDENT_AMBULATORY_CARE_PROVIDER_SITE_OTHER): Payer: Medicare Other | Admitting: Family Medicine

## 2013-08-27 VITALS — BP 143/71 | HR 88 | Temp 98.1°F | Resp 20 | Wt 246.0 lb

## 2013-08-27 DIAGNOSIS — Z8639 Personal history of other endocrine, nutritional and metabolic disease: Secondary | ICD-10-CM

## 2013-08-27 DIAGNOSIS — Z862 Personal history of diseases of the blood and blood-forming organs and certain disorders involving the immune mechanism: Secondary | ICD-10-CM | POA: Diagnosis not present

## 2013-08-27 DIAGNOSIS — R109 Unspecified abdominal pain: Secondary | ICD-10-CM | POA: Diagnosis not present

## 2013-08-27 DIAGNOSIS — R141 Gas pain: Secondary | ICD-10-CM

## 2013-08-27 DIAGNOSIS — R5381 Other malaise: Secondary | ICD-10-CM | POA: Diagnosis not present

## 2013-08-27 DIAGNOSIS — R142 Eructation: Secondary | ICD-10-CM

## 2013-08-27 DIAGNOSIS — R5383 Other fatigue: Secondary | ICD-10-CM

## 2013-08-27 DIAGNOSIS — K5900 Constipation, unspecified: Secondary | ICD-10-CM

## 2013-08-27 DIAGNOSIS — R143 Flatulence: Secondary | ICD-10-CM

## 2013-08-27 DIAGNOSIS — R635 Abnormal weight gain: Secondary | ICD-10-CM | POA: Diagnosis not present

## 2013-08-27 DIAGNOSIS — R140 Abdominal distension (gaseous): Secondary | ICD-10-CM

## 2013-08-27 LAB — COMPREHENSIVE METABOLIC PANEL
ALBUMIN: 4.2 g/dL (ref 3.5–5.2)
ALT: 12 U/L (ref 0–35)
AST: 15 U/L (ref 0–37)
Alkaline Phosphatase: 61 U/L (ref 39–117)
BUN: 12 mg/dL (ref 6–23)
CHLORIDE: 100 meq/L (ref 96–112)
CO2: 26 meq/L (ref 19–32)
Calcium: 9.1 mg/dL (ref 8.4–10.5)
Creat: 0.64 mg/dL (ref 0.50–1.10)
Glucose, Bld: 117 mg/dL — ABNORMAL HIGH (ref 70–99)
POTASSIUM: 4.1 meq/L (ref 3.5–5.3)
SODIUM: 136 meq/L (ref 135–145)
TOTAL PROTEIN: 7.1 g/dL (ref 6.0–8.3)
Total Bilirubin: 2.8 mg/dL — ABNORMAL HIGH (ref 0.2–1.2)

## 2013-08-27 LAB — CBC WITH DIFFERENTIAL/PLATELET
Basophils Absolute: 0 10*3/uL (ref 0.0–0.1)
Basophils Relative: 0 % (ref 0–1)
Eosinophils Absolute: 0.3 10*3/uL (ref 0.0–0.7)
Eosinophils Relative: 4 % (ref 0–5)
HCT: 26.7 % — ABNORMAL LOW (ref 36.0–46.0)
HEMOGLOBIN: 9 g/dL — AB (ref 12.0–15.0)
LYMPHS ABS: 1.9 10*3/uL (ref 0.7–4.0)
LYMPHS PCT: 25 % (ref 12–46)
MCH: 24.9 pg — AB (ref 26.0–34.0)
MCHC: 33.7 g/dL (ref 30.0–36.0)
MCV: 74 fL — ABNORMAL LOW (ref 78.0–100.0)
MONOS PCT: 7 % (ref 3–12)
Monocytes Absolute: 0.5 10*3/uL (ref 0.1–1.0)
NEUTROS ABS: 4.9 10*3/uL (ref 1.7–7.7)
NEUTROS PCT: 64 % (ref 43–77)
Platelets: 149 10*3/uL — ABNORMAL LOW (ref 150–400)
RBC: 3.61 MIL/uL — ABNORMAL LOW (ref 3.87–5.11)
RDW: 19.9 % — ABNORMAL HIGH (ref 11.5–15.5)
WBC: 7.7 10*3/uL (ref 4.0–10.5)

## 2013-08-27 MED ORDER — POLYETHYLENE GLYCOL 3350 17 GM/SCOOP PO POWD: 17.0000 g | Freq: Every day | ORAL | Status: DC

## 2013-08-27 MED ORDER — SENNOSIDES-DOCUSATE SODIUM 8.6-50 MG PO TABS: 2.0000 | ORAL_TABLET | Freq: Every day | ORAL | Status: DC

## 2013-08-27 NOTE — Patient Instructions (Signed)
Patient to increase water intake to 6-8 glasses daily. Start bland, soft diet over the weekend. Refrain from eating fast, processed or high fat foods over the weekend.  Start [employer]. Take 2 tablets with a full glass of water prior bed. Patient should have bowel movement in 6-8 hours. Start daily Miralax regimen for constipation prophylaxis.  Increase daily activity. Walk 30 minutes/3 days per week

## 2013-08-27 NOTE — Progress Notes (Signed)
Subjective:    Patient ID: Cassie Montgomery, female    DOB: 1970-09-03, 43 y.o.   MRN: [id number]  Constipation This is a new problem. The current episode started in the past 7 days. The problem has been gradually worsening since onset. The patient is not on a high fiber diet. She does not exercise regularly. There has not been adequate water intake. Associated symptoms include abdominal pain. Pertinent negatives include no fever. Risk factors include obesity, change in medication usage/dosage and immobility. Her past medical history is significant for metabolic disease.   Patient with a history of sickle cell anemia complaining of constipation, abdominal pain, bloating, and intermittent vomiting for 4 days. Patient is on chronic pain management therapy. Patient reports that she has had vomiting and early satiety for 2-3 days. Reports that  vomitus has been oily in consistency. She states that constipation worsens with eating. Patient has not attempted any over the counter interventions to alleviated abdominal pain and constipation.    Review of Systems  Constitutional: Positive for fatigue and unexpected weight change. Negative for fever.  Eyes: Negative.   Gastrointestinal: Positive for abdominal pain and constipation.  Endocrine: Negative.   Genitourinary: Negative.   Musculoskeletal: Positive for myalgias.  Skin: Negative.   Allergic/Immunologic: Negative.   Neurological: Positive for dizziness.  Hematological: Negative.   Psychiatric/Behavioral: Negative.        Objective:   Physical Exam  Constitutional: She is oriented to person, place, and time. She appears well-developed and well-nourished.  HENT:  Head: Normocephalic.  Cardiovascular: Normal rate, regular rhythm and normal heart sounds.  Exam reveals no gallop.   No murmur heard. Pulmonary/Chest: Effort normal and breath sounds normal.  Abdominal: Soft. Normal appearance. She exhibits no distension and no mass. Bowel  sounds are decreased. There is generalized tenderness. There is guarding. There is no rigidity, no rebound, no CVA tenderness, no tenderness at McBurney's point and negative Murphy's sign.  Musculoskeletal:       Right knee: She exhibits normal range of motion and no swelling. Tenderness found.       Left knee: She exhibits normal range of motion. Tenderness found.  Neurological: She is alert and oriented to person, place, and time. No cranial nerve deficit. Coordination normal.  Skin: Skin is warm and dry. No rash noted. No erythema.          Assessment & Plan:  1. Sickle cell anemia: Patient to continue good hydration, monitoring of hydration status, avoidance of heat, cold, stress, and infection triggers. The patient was reminded of the need to seek medical attention of any symptoms of bleeding, anemia, or infection. Continue folic acid 1 mg daily to prevent aplastic bone marrow crises.   2. Abdominal bloating and constipation: Abdominal distension and bloating related to constipation. Patient reports that bloating is worsening daily. Discussed the side effects of chronic pain management. Patient to start daily bowel regimen including Miralax 17 g daily and Senakot as needed. Patient reports that menstrual cycles are changing. Cycles are consistent in occurrence, but the amount of days is inconsistent monthly. Patient had a normal pap smear in February.   3. Abdominal pain: Patient reports abdominal discomfort and distention related to chronic constipation. Also,  Patient does not follow a balanced diet. Reports eating high fat, high carbohydrate meals. Also, patient has not been hydrating properly. Will check CMP and UA  4. Weight gain: Recommend 1800 calorie, lowfat, low carbohydrate diet divided over 6 small meals. Walk for 30 minutes 3  times per week for 5 weeks. Increase water intake to 6-8 glasses per day.   4. Fatigue: Patient reports that she has been feeling more tired that usual. I  will check CBC w/differential and CMP. Patient reports a history of pre-diabetes, will check HbA1C.    RTC: 1 week Labs: CMP, CBCw/diff, HbA1C, UA

## 2013-08-28 LAB — URINALYSIS, COMPLETE
BACTERIA UA: NONE SEEN
BILIRUBIN URINE: NEGATIVE
CASTS: NONE SEEN
Crystals: NONE SEEN
Glucose, UA: NEGATIVE mg/dL
HGB URINE DIPSTICK: NEGATIVE
KETONES UR: NEGATIVE mg/dL
Leukocytes, UA: NEGATIVE
NITRITE: NEGATIVE
PH: 6 (ref 5.0–8.0)
Protein, ur: NEGATIVE mg/dL
Specific Gravity, Urine: 1.011 (ref 1.005–1.030)
Squamous Epithelial / LPF: NONE SEEN
Urobilinogen, UA: 1 mg/dL (ref 0.0–1.0)

## 2013-08-28 LAB — HEMOGLOBIN A1C
HEMOGLOBIN A1C: 4 % (ref <5.7)
Mean Plasma Glucose: 68 mg/dL (ref <117)

## 2013-08-31 NOTE — Telephone Encounter (Signed)
error 

## 2013-09-01 ENCOUNTER — Ambulatory Visit: Payer: Medicare Other | Admitting: Family Medicine

## 2013-09-03 ENCOUNTER — Other Ambulatory Visit: Payer: Self-pay | Admitting: Internal Medicine

## 2013-09-06 NOTE — Telephone Encounter (Signed)
Medication refill request for Lorazepam 1mg  tablets/ last office visit 08/27/2013

## 2013-09-06 NOTE — Telephone Encounter (Signed)
Ativan 1 mg every 8 hours as needed for anxiety. #90

## 2013-09-09 ENCOUNTER — Telehealth: Payer: Self-pay | Admitting: Internal Medicine

## 2013-09-09 NOTE — Telephone Encounter (Signed)
Patient has an appointment on 09/10/2013

## 2013-09-09 NOTE — Telephone Encounter (Signed)
LOV 08/27/2013 with L. Hollis NP, routed accordingly

## 2013-09-10 ENCOUNTER — Other Ambulatory Visit (HOSPITAL_COMMUNITY): Payer: Self-pay | Admitting: Family Medicine

## 2013-09-10 ENCOUNTER — Ambulatory Visit: Payer: Medicare Other | Admitting: Family Medicine

## 2013-09-10 DIAGNOSIS — D5720 Sickle-cell/Hb-C disease without crisis: Secondary | ICD-10-CM

## 2013-09-10 MED ORDER — MORPHINE SULFATE 15 MG PO TABS
15.0000 mg | ORAL_TABLET | ORAL | Status: DC | PRN
Start: 2013-09-10 — End: 2013-09-23

## 2013-09-10 NOTE — Progress Notes (Signed)
Re-ordered MS IR 15 mg every 4 hours for severe pain #90

## 2013-09-15 ENCOUNTER — Ambulatory Visit: Payer: Medicare Other | Admitting: Family Medicine

## 2013-09-15 ENCOUNTER — Telehealth: Payer: Self-pay | Admitting: Internal Medicine

## 2013-09-15 DIAGNOSIS — G894 Chronic pain syndrome: Secondary | ICD-10-CM

## 2013-09-15 DIAGNOSIS — D5720 Sickle-cell/Hb-C disease without crisis: Secondary | ICD-10-CM

## 2013-09-15 NOTE — Telephone Encounter (Signed)
Contacted patient for schedule adjustment. Patient agreed to change appointment time today to 1330 .

## 2013-09-16 MED ORDER — METHADONE HCL 5 MG PO TABS: 5.0000 mg | ORAL_TABLET | Freq: Every day | ORAL | Status: DC

## 2013-09-16 NOTE — Telephone Encounter (Signed)
Re-ordered Methadone 5 mg at bedtime #30  Reviewed [place] Substance reporting system prior to re-order

## 2013-09-23 ENCOUNTER — Ambulatory Visit (INDEPENDENT_AMBULATORY_CARE_PROVIDER_SITE_OTHER): Payer: Medicare Other | Admitting: Family Medicine

## 2013-09-23 ENCOUNTER — Encounter: Payer: Self-pay | Admitting: Family Medicine

## 2013-09-23 VITALS — BP 125/85 | HR 93 | Temp 98.3°F | Resp 18 | Wt 230.0 lb

## 2013-09-23 DIAGNOSIS — K5900 Constipation, unspecified: Secondary | ICD-10-CM | POA: Diagnosis not present

## 2013-09-23 DIAGNOSIS — F3289 Other specified depressive episodes: Secondary | ICD-10-CM | POA: Diagnosis not present

## 2013-09-23 DIAGNOSIS — D5720 Sickle-cell/Hb-C disease without crisis: Secondary | ICD-10-CM | POA: Diagnosis not present

## 2013-09-23 DIAGNOSIS — F329 Major depressive disorder, single episode, unspecified: Secondary | ICD-10-CM

## 2013-09-23 DIAGNOSIS — F411 Generalized anxiety disorder: Secondary | ICD-10-CM

## 2013-09-23 DIAGNOSIS — R635 Abnormal weight gain: Secondary | ICD-10-CM

## 2013-09-23 DIAGNOSIS — F32A Depression, unspecified: Secondary | ICD-10-CM

## 2013-09-23 MED ORDER — IBUPROFEN 800 MG PO TABS: 800.0000 mg | ORAL_TABLET | Freq: Three times a day (TID) | ORAL | Status: DC | PRN

## 2013-09-23 MED ORDER — MORPHINE SULFATE 15 MG PO TABS: 15.0000 mg | ORAL_TABLET | ORAL | Status: DC | PRN

## 2013-09-23 NOTE — Progress Notes (Signed)
Subjective:    Patient ID: Cassie Montgomery, female    DOB: [birth year], 43 y.o.   MRN: [id number]  HPI  Patient following up on abdominal pain and constipation. Patient reports that symptoms have improved since last visit. Maintains that she has been taking Miralax daily which has improved overall bowel movements.   Also, patient complaining of increased depression and anxiety. States that Zoloft 50 mg daily is not helping symptoms of depression. Reports that she does not want to get out of bed on most days. Patient denies suicidal or homicidal ideations.   Review of Systems  Constitutional: Positive for fatigue. Negative for chills.  Eyes: Negative.   Respiratory: Negative.   Cardiovascular: Negative.   Gastrointestinal: Negative.  Negative for constipation.  Endocrine: Negative.   Genitourinary: Negative.   Musculoskeletal: Positive for back pain and myalgias.  Skin: Negative.   Allergic/Immunologic: Negative.   Neurological: Negative.  Negative for weakness.  Hematological: Negative.   Psychiatric/Behavioral: Positive for sleep disturbance and decreased concentration. Negative for suicidal ideas and behavioral problems. The patient is nervous/anxious.        Objective:   Physical Exam  Constitutional: She appears well-developed and well-nourished.  HENT:  Head: Normocephalic and atraumatic.  Eyes: Lids are normal. Lids are everted and swept, no foreign bodies found.  Neck: Trachea normal, normal range of motion, full passive range of motion without pain and phonation normal. Neck supple.  Cardiovascular: Normal rate, regular rhythm and normal pulses.   Pulmonary/Chest: Effort normal. No respiratory distress.  Abdominal: Soft. There is no tenderness. There is no rebound.  Musculoskeletal:       Left knee: She exhibits decreased range of motion. She exhibits no swelling and no erythema. Tenderness found.  Neurological: She is alert. She has normal strength. No cranial nerve  deficit or sensory deficit.  Skin: Skin is warm, dry and intact.  Psychiatric: Her speech is normal and behavior is normal. Judgment and thought content normal. Cognition and memory are normal. She exhibits a depressed mood.          Assessment & Plan:  1. Sickle cell anemia, Hb [place]: Stable on current medication regimen. Patient to continue good hydration, monitoring of hydration status, avoidance of heat, cold, stress, and infection triggers. The patient was reminded of the need to seek medical attention of any symptoms of bleeding, anemia, or infection. Continue folic acid 1 mg daily to prevent aplastic bone marrow crises. Reordered MSIR 15 mg every 4 hours as needed for severe pain #90,.  2. Abdominal bloating and constipation: Abdominal distension and bloating resolved. Patient reports that bloating is worsening daily.  Patient to continue daily bowel regimen including Miralax 17 g daily and Senakot as needed.   3. Abdominal pain: Patient reports abdominal discomfort has improved. Patient does not  follow a balanced diet. Reports eating high fat, high carbohydrate meals. Also, patient has not been hydrating properly. Discussed labs at length. Recommend following a low fat, carbohydrate modified diet.  4. Weight gain: Recommend 1800 calorie, lowfat, low carbohydrate diet divided over 6 small meals. Walk for 30 minutes 3 times per week for 5 weeks. Increase water intake to 6-8 glasses per day.  4. Fatigue: Patient reports that she has been feeling more tired that usual. Discussed CBC and CMP results at length.  5. Depression and anxiety: Patient to continue Zoloft 50 mg. Will refer to Dr. Hart Carwin for therapy. Discussed depression at length. Patient feels that her illness is keeping her from pursuing her  goals. It makes her sad to see others that are able to have a "normal" life, when she cannot.    RTC: 2 months with Dr. Zigmund Daniel for follow-up of SCD  Dorena Dew, FNP

## 2013-09-27 DIAGNOSIS — R635 Abnormal weight gain: Secondary | ICD-10-CM | POA: Insufficient documentation

## 2013-09-27 DIAGNOSIS — K5900 Constipation, unspecified: Secondary | ICD-10-CM | POA: Insufficient documentation

## 2013-09-29 ENCOUNTER — Telehealth: Payer: Self-pay | Admitting: Internal Medicine

## 2013-09-29 ENCOUNTER — Ambulatory Visit: Payer: Medicare Other | Admitting: Family Medicine

## 2013-09-29 ENCOUNTER — Telehealth (HOSPITAL_COMMUNITY): Payer: Self-pay | Admitting: *Deleted

## 2013-09-29 NOTE — Telephone Encounter (Signed)
Informed Cassie Montgomery CMA in [facility] office to contact patient for acute care workup today. Cassie Montgomery acknowledges.

## 2013-09-29 NOTE — Telephone Encounter (Signed)
Received patient call, patient c/o sickle cell crisis and cough since yesterday, pain to bilateral arms and legs, rating pain 7/10, and wheezing noted. Patient reports some shortness of breath, nausea and vomiting, patient is able to tolerate POs. Patient denies all other pertinent ROS per [facility] Triage Assessment. Informed patient that this RN will notify provider on call and patient is to receive a return call back with recommendation. Patient acknowledges.

## 2013-09-29 NOTE — Telephone Encounter (Signed)
Contacted patient for acute work in appointment for today at 1215.

## 2013-10-01 ENCOUNTER — Telehealth (HOSPITAL_COMMUNITY): Payer: Self-pay | Admitting: Internal Medicine

## 2013-10-01 NOTE — Telephone Encounter (Signed)
Pt called to center, stated pain level of 7 and being unable to manage her disease at home. Dr Jonelle Sidle is aware and recommend to check in to ER. Pt is called back twice and message with the above information is left on the answering machine. Mayra Neer

## 2013-10-05 ENCOUNTER — Telehealth: Payer: Self-pay | Admitting: Internal Medicine

## 2013-10-05 DIAGNOSIS — D5720 Sickle-cell/Hb-C disease without crisis: Secondary | ICD-10-CM

## 2013-10-06 ENCOUNTER — Telehealth: Payer: Self-pay

## 2013-10-06 MED ORDER — MORPHINE SULFATE 15 MG PO TABS: 15.0000 mg | ORAL_TABLET | ORAL | Status: DC | PRN

## 2013-10-06 NOTE — Telephone Encounter (Signed)
Pt was contacted,voiced understanding and will be placed onto Dr. Zigmund Daniel Sch.@1 :30pm 10/07/2013

## 2013-10-06 NOTE — Telephone Encounter (Signed)
Re-ordered MSIR 15 mg every 4 hours prn for severe pain #90   Reviewed [place] Substance Reporting system prior to reorder

## 2013-10-06 NOTE — Telephone Encounter (Signed)
Patient needs to schedule an appointment with Dr. Zigmund Daniel or [place] Britaney Espaillat, NP to address issue.

## 2013-10-06 NOTE — Telephone Encounter (Signed)
Message     ----- Message from Dorena Dew, [place] sent at 10/06/2013 2:13 PM -----     Add the patient on to Dr. Zigmund Daniel schedule for tomorrow    Pt is having issues W/  Her Monthly Cycle.

## 2013-10-07 ENCOUNTER — Other Ambulatory Visit: Payer: Self-pay | Admitting: Internal Medicine

## 2013-10-07 ENCOUNTER — Ambulatory Visit (INDEPENDENT_AMBULATORY_CARE_PROVIDER_SITE_OTHER): Payer: Medicare Other | Admitting: Internal Medicine

## 2013-10-07 ENCOUNTER — Encounter: Payer: Self-pay | Admitting: Internal Medicine

## 2013-10-07 VITALS — BP 132/86 | HR 76 | Temp 97.8°F | Resp 20 | Wt 234.0 lb

## 2013-10-07 DIAGNOSIS — F329 Major depressive disorder, single episode, unspecified: Secondary | ICD-10-CM | POA: Diagnosis not present

## 2013-10-07 DIAGNOSIS — F4310 Post-traumatic stress disorder, unspecified: Secondary | ICD-10-CM

## 2013-10-07 DIAGNOSIS — T887XXA Unspecified adverse effect of drug or medicament, initial encounter: Secondary | ICD-10-CM

## 2013-10-07 DIAGNOSIS — D5720 Sickle-cell/Hb-C disease without crisis: Secondary | ICD-10-CM

## 2013-10-07 DIAGNOSIS — D509 Iron deficiency anemia, unspecified: Secondary | ICD-10-CM

## 2013-10-07 DIAGNOSIS — D57219 Sickle-cell/Hb-C disease with crisis, unspecified: Secondary | ICD-10-CM

## 2013-10-07 LAB — CBC WITH DIFFERENTIAL/PLATELET
BASOS ABS: 0 10*3/uL (ref 0.0–0.1)
BASOS PCT: 0 % (ref 0–1)
EOS ABS: 0.1 10*3/uL (ref 0.0–0.7)
EOS PCT: 1 % (ref 0–5)
HEMATOCRIT: 29.7 % — AB (ref 36.0–46.0)
Hemoglobin: 9.9 g/dL — ABNORMAL LOW (ref 12.0–15.0)
Lymphocytes Relative: 19 % (ref 12–46)
Lymphs Abs: 1.6 10*3/uL (ref 0.7–4.0)
MCH: 24.5 pg — ABNORMAL LOW (ref 26.0–34.0)
MCHC: 33.3 g/dL (ref 30.0–36.0)
MCV: 73.5 fL — AB (ref 78.0–100.0)
MONO ABS: 0.3 10*3/uL (ref 0.1–1.0)
Monocytes Relative: 4 % (ref 3–12)
Neutro Abs: 6.5 10*3/uL (ref 1.7–7.7)
Neutrophils Relative %: 76 % (ref 43–77)
Platelets: 150 10*3/uL (ref 150–400)
RBC: 4.04 MIL/uL (ref 3.87–5.11)
RDW: 22.3 % — AB (ref 11.5–15.5)
WBC: 8.5 10*3/uL (ref 4.0–10.5)

## 2013-10-07 MED ORDER — MORPHINE SULFATE 15 MG PO TABS: 22.5000 mg | ORAL_TABLET | ORAL | Status: DC | PRN

## 2013-10-07 MED ORDER — HYDROXYUREA 500 MG PO CAPS: 1500.0000 mg | ORAL_CAPSULE | Freq: Every day | ORAL | Status: DC

## 2013-10-07 NOTE — Progress Notes (Signed)
   Subjective:    Patient ID: Cassie Montgomery, female    DOB: 1971/02/27, 43 y.o.   MRN: [id number]  HPI: Pt has been having pain since 09/28/2013 which is localized to B/L  arms, left leg and BLQ of abdomen. Pt describes pain as initially 9/10 and is now down to 6/10.after treatment with Methadone and Morphine sulfate 15 mg. She feels that she is not rebounding from her crises as quickly as before and she is scared of what this might mean for her.   Pt also states that she has been having her menses longer than usual but this has now resolved. She feels that this has contributed to her pain.   Pt also expresses feeling of depression but continues to refuse adjustment in her anti-depressant medications. She did not follow through with her appointment for therapy.    Review of Systems  Constitutional: Negative.   HENT: Negative.   Eyes: Negative.   Respiratory: Negative.   Cardiovascular: Negative.   Gastrointestinal: Negative.   Endocrine: Negative.   Genitourinary: Negative.   Musculoskeletal: Positive for myalgias.  Skin: Negative.   Allergic/Immunologic: Negative.   Neurological: Negative.   Hematological: Negative.   Psychiatric/Behavioral: Negative.        Objective:   Physical Exam  Vitals reviewed. Constitutional: She is oriented to person, place, and time. She appears well-developed and well-nourished.  HENT:  Head: Normocephalic and atraumatic.  Eyes: Conjunctivae and EOM are normal. Pupils are equal, round, and reactive to light. No scleral icterus.  Neck: Normal range of motion. Neck supple. No JVD present. No thyromegaly present.  Cardiovascular: Normal rate and regular rhythm.  Exam reveals no gallop and no friction rub.   No murmur heard. Pulmonary/Chest: Effort normal and breath sounds normal. She has no wheezes. She has no rales.  Abdominal: Soft. Bowel sounds are normal. She exhibits no distension and no mass. There is no tenderness.  Musculoskeletal: Normal  range of motion.  Lymphadenopathy:    She has cervical adenopathy.  Neurological: She is alert and oriented to person, place, and time. No cranial nerve deficit.  Skin: Skin is warm and dry.  Psychiatric: Her behavior is normal. Judgment and thought content normal.          Assessment & Plan:  1. Hb [place] with vaso-occlusive pain: Pt does not want to receive acute treatment for pain. She feels that the increased dose of oral medications will be adequate to manage pain. Pt now willing, and I will prescribe Hydrea 1500 mg daily. Will also increase MS IR to 22.5 mg every 4 hours. If pt still requiring pain medications ATC despite Hydrea, I will add a long acting medication. However patient is very resistant to taking a long acting as it causes her to feel weak and clouded in her thoughts.  2. Depression: Py has been resistant to going to therapy and has been referred in the past. She has not kept her appointments but is now willing to see a Therapist. Will refer again to Therapy.   3. Grief and loss: Pt is still feeling loss and grief over the death of a friend with SCD which occurred 2 months ago.    Labs: CBC with diff  Repeat CBC with diff: 1 month  RTC: 3 months  [name]

## 2013-10-11 NOTE — Addendum Note (Signed)
Addended by: Liston Alba A on: 10/11/2013 11:35 AM   Modules accepted: Orders

## 2013-10-13 ENCOUNTER — Telehealth: Payer: Self-pay

## 2013-10-13 NOTE — Telephone Encounter (Signed)
Result Note      Please notify Cassie Montgomery that labs okay for starting Hydrea. However need to check for Iron deficiency anemia which will check on her next scheduled labs. (2 weeks from last visit).     Pt was contacted and given Lab results.Pt was told that it was ok to start med that was spoken of in office visit.Pt was made aware of RTC Lab Visit on the 11th of June

## 2013-10-19 ENCOUNTER — Telehealth: Payer: Self-pay | Admitting: Internal Medicine

## 2013-10-19 DIAGNOSIS — D5720 Sickle-cell/Hb-C disease without crisis: Secondary | ICD-10-CM

## 2013-10-20 MED ORDER — MORPHINE SULFATE 15 MG PO TABS: 22.5000 mg | ORAL_TABLET | ORAL | Status: DC | PRN

## 2013-10-20 NOTE — Telephone Encounter (Signed)
Prescription refilled for MSIR 15 mg #135 tabs (15 days).

## 2013-10-20 NOTE — Telephone Encounter (Signed)
Refill request for morphine (MSIR) 15 MG tablet

## 2013-10-21 ENCOUNTER — Other Ambulatory Visit: Payer: Medicare Other

## 2013-10-21 DIAGNOSIS — T887XXA Unspecified adverse effect of drug or medicament, initial encounter: Secondary | ICD-10-CM

## 2013-10-21 LAB — CBC WITH DIFFERENTIAL/PLATELET
BASOS ABS: 0 10*3/uL (ref 0.0–0.1)
BASOS PCT: 0 % (ref 0–1)
EOS ABS: 0 10*3/uL (ref 0.0–0.7)
Eosinophils Relative: 0 % (ref 0–5)
HCT: 32.7 % — ABNORMAL LOW (ref 36.0–46.0)
Hemoglobin: 11.1 g/dL — ABNORMAL LOW (ref 12.0–15.0)
Lymphocytes Relative: 22 % (ref 12–46)
Lymphs Abs: 2.6 10*3/uL (ref 0.7–4.0)
MCH: 24.9 pg — AB (ref 26.0–34.0)
MCHC: 33.9 g/dL (ref 30.0–36.0)
MCV: 73.3 fL — ABNORMAL LOW (ref 78.0–100.0)
Monocytes Absolute: 0.7 10*3/uL (ref 0.1–1.0)
Monocytes Relative: 6 % (ref 3–12)
NEUTROS ABS: 8.4 10*3/uL — AB (ref 1.7–7.7)
NEUTROS PCT: 72 % (ref 43–77)
PLATELETS: 217 10*3/uL (ref 150–400)
RBC: 4.46 MIL/uL (ref 3.87–5.11)
RDW: 21.2 % — AB (ref 11.5–15.5)
WBC: 11.6 10*3/uL — ABNORMAL HIGH (ref 4.0–10.5)

## 2013-10-28 ENCOUNTER — Telehealth: Payer: Self-pay | Admitting: Internal Medicine

## 2013-10-28 DIAGNOSIS — G4700 Insomnia, unspecified: Secondary | ICD-10-CM

## 2013-10-28 DIAGNOSIS — G894 Chronic pain syndrome: Secondary | ICD-10-CM

## 2013-10-28 DIAGNOSIS — D5720 Sickle-cell/Hb-C disease without crisis: Secondary | ICD-10-CM

## 2013-10-29 MED ORDER — ZOLPIDEM TARTRATE 5 MG PO TABS: 5.0000 mg | ORAL_TABLET | Freq: Every evening | ORAL | Status: DC | PRN

## 2013-10-29 MED ORDER — METHADONE HCL 5 MG PO TABS: 5.0000 mg | ORAL_TABLET | Freq: Every day | ORAL | Status: DC

## 2013-10-29 NOTE — Telephone Encounter (Signed)
Prescriptions filled for Methadone 5 mg #30 tabs and Ambien 5 mg #30 tabs.

## 2013-11-04 ENCOUNTER — Telehealth: Payer: Self-pay | Admitting: Internal Medicine

## 2013-11-04 DIAGNOSIS — F4321 Adjustment disorder with depressed mood: Secondary | ICD-10-CM

## 2013-11-04 DIAGNOSIS — D5720 Sickle-cell/Hb-C disease without crisis: Secondary | ICD-10-CM

## 2013-11-04 MED ORDER — MORPHINE SULFATE 15 MG PO TABS: 22.5000 mg | ORAL_TABLET | ORAL | Status: DC | PRN

## 2013-11-04 MED ORDER — LORAZEPAM 1 MG PO TABS: ORAL_TABLET | ORAL | Status: DC

## 2013-11-04 NOTE — Telephone Encounter (Signed)
Prescription filled for MSIR #135 tabs and Lorazepam 1 mg #90 tabs

## 2013-11-04 NOTE — Telephone Encounter (Signed)
Refill request for morphine (MSIR) 15 MG tablet, LORazepam (ATIVAN) 1 MG tablet / LOV 10/07/2013

## 2013-11-08 ENCOUNTER — Inpatient Hospital Stay (HOSPITAL_COMMUNITY)
Admission: EM | Admit: 2013-11-08 | Discharge: 2013-11-11 | DRG: 812 | Disposition: A | Discharge status: Home / Self Care | Payer: Medicare Other | Attending: Internal Medicine | Admitting: Internal Medicine

## 2013-11-08 ENCOUNTER — Emergency Department (HOSPITAL_COMMUNITY): Payer: Medicare Other

## 2013-11-08 ENCOUNTER — Telehealth: Payer: Self-pay | Admitting: Family Medicine

## 2013-11-08 ENCOUNTER — Encounter (HOSPITAL_COMMUNITY): Payer: Self-pay | Admitting: Emergency Medicine

## 2013-11-08 DIAGNOSIS — R140 Abdominal distension (gaseous): Secondary | ICD-10-CM

## 2013-11-08 DIAGNOSIS — R0609 Other forms of dyspnea: Secondary | ICD-10-CM | POA: Diagnosis present

## 2013-11-08 DIAGNOSIS — R1011 Right upper quadrant pain: Secondary | ICD-10-CM | POA: Diagnosis present

## 2013-11-08 DIAGNOSIS — F329 Major depressive disorder, single episode, unspecified: Secondary | ICD-10-CM | POA: Diagnosis present

## 2013-11-08 DIAGNOSIS — D5700 Hb-SS disease with crisis, unspecified: Secondary | ICD-10-CM | POA: Diagnosis not present

## 2013-11-08 DIAGNOSIS — Z881 Allergy status to other antibiotic agents status: Secondary | ICD-10-CM | POA: Diagnosis not present

## 2013-11-08 DIAGNOSIS — E876 Hypokalemia: Secondary | ICD-10-CM | POA: Diagnosis present

## 2013-11-08 DIAGNOSIS — F3289 Other specified depressive episodes: Secondary | ICD-10-CM | POA: Diagnosis not present

## 2013-11-08 DIAGNOSIS — R062 Wheezing: Secondary | ICD-10-CM | POA: Diagnosis not present

## 2013-11-08 DIAGNOSIS — R0602 Shortness of breath: Secondary | ICD-10-CM | POA: Diagnosis not present

## 2013-11-08 DIAGNOSIS — F411 Generalized anxiety disorder: Secondary | ICD-10-CM | POA: Diagnosis present

## 2013-11-08 DIAGNOSIS — Z79899 Other long term (current) drug therapy: Secondary | ICD-10-CM

## 2013-11-08 DIAGNOSIS — Z8249 Family history of ischemic heart disease and other diseases of the circulatory system: Secondary | ICD-10-CM | POA: Diagnosis not present

## 2013-11-08 DIAGNOSIS — F32A Depression, unspecified: Secondary | ICD-10-CM

## 2013-11-08 DIAGNOSIS — D57219 Sickle-cell/Hb-C disease with crisis, unspecified: Secondary | ICD-10-CM | POA: Diagnosis present

## 2013-11-08 DIAGNOSIS — Z825 Family history of asthma and other chronic lower respiratory diseases: Secondary | ICD-10-CM

## 2013-11-08 DIAGNOSIS — J45909 Unspecified asthma, uncomplicated: Secondary | ICD-10-CM | POA: Diagnosis present

## 2013-11-08 DIAGNOSIS — K219 Gastro-esophageal reflux disease without esophagitis: Secondary | ICD-10-CM | POA: Diagnosis present

## 2013-11-08 DIAGNOSIS — D696 Thrombocytopenia, unspecified: Secondary | ICD-10-CM | POA: Diagnosis present

## 2013-11-08 DIAGNOSIS — G8929 Other chronic pain: Secondary | ICD-10-CM | POA: Diagnosis present

## 2013-11-08 DIAGNOSIS — G894 Chronic pain syndrome: Secondary | ICD-10-CM | POA: Diagnosis not present

## 2013-11-08 DIAGNOSIS — R141 Gas pain: Secondary | ICD-10-CM | POA: Diagnosis not present

## 2013-11-08 DIAGNOSIS — R0989 Other specified symptoms and signs involving the circulatory and respiratory systems: Secondary | ICD-10-CM | POA: Diagnosis present

## 2013-11-08 DIAGNOSIS — M79606 Pain in leg, unspecified: Secondary | ICD-10-CM | POA: Diagnosis present

## 2013-11-08 DIAGNOSIS — M79609 Pain in unspecified limb: Secondary | ICD-10-CM | POA: Diagnosis present

## 2013-11-08 HISTORY — DX: Unspecified asthma, uncomplicated: J45.909

## 2013-11-08 HISTORY — DX: Gastro-esophageal reflux disease without esophagitis: K21.9

## 2013-11-08 HISTORY — DX: Depression, unspecified: F32.A

## 2013-11-08 HISTORY — DX: Major depressive disorder, single episode, unspecified: F32.9

## 2013-11-08 LAB — CBC WITH DIFFERENTIAL/PLATELET
BASOS ABS: 0.1 10*3/uL (ref 0.0–0.1)
Basophils Relative: 1 % (ref 0–1)
EOS ABS: 0.2 10*3/uL (ref 0.0–0.7)
Eosinophils Relative: 3 % (ref 0–5)
HEMATOCRIT: 27.6 % — AB (ref 36.0–46.0)
Hemoglobin: 9 g/dL — ABNORMAL LOW (ref 12.0–15.0)
LYMPHS ABS: 2.1 10*3/uL (ref 0.7–4.0)
Lymphocytes Relative: 31 % (ref 12–46)
MCH: 24.5 pg — ABNORMAL LOW (ref 26.0–34.0)
MCHC: 32.6 g/dL (ref 30.0–36.0)
MCV: 75 fL — AB (ref 78.0–100.0)
Monocytes Absolute: 0.4 10*3/uL (ref 0.1–1.0)
Monocytes Relative: 6 % (ref 3–12)
NEUTROS ABS: 4.1 10*3/uL (ref 1.7–7.7)
Neutrophils Relative %: 59 % (ref 43–77)
PLATELETS: 122 10*3/uL — AB (ref 150–400)
RBC: 3.68 MIL/uL — ABNORMAL LOW (ref 3.87–5.11)
RDW: 20 % — AB (ref 11.5–15.5)
WBC: 6.9 10*3/uL (ref 4.0–10.5)

## 2013-11-08 LAB — BASIC METABOLIC PANEL
BUN: 9 mg/dL (ref 6–23)
CALCIUM: 9.3 mg/dL (ref 8.4–10.5)
CO2: 28 mEq/L (ref 19–32)
Chloride: 97 mEq/L (ref 96–112)
Creatinine, Ser: 0.66 mg/dL (ref 0.50–1.10)
GFR calc non Af Amer: >90 mL/min (ref >90)
Glucose, Bld: 125 mg/dL — ABNORMAL HIGH (ref 70–99)
Potassium: 3.9 mEq/L (ref 3.7–5.3)
Sodium: 137 mEq/L (ref 137–147)

## 2013-11-08 LAB — RETICULOCYTES
RBC.: 3.68 MIL/uL — ABNORMAL LOW (ref 3.87–5.11)
RETIC CT PCT: 8.2 % — AB (ref 0.4–3.1)
Retic Count, Absolute: 301.8 10*3/uL — ABNORMAL HIGH (ref 19.0–186.0)

## 2013-11-08 LAB — HEPATIC FUNCTION PANEL
ALT: 12 U/L (ref 0–35)
AST: 17 U/L (ref 0–37)
Albumin: 4.1 g/dL (ref 3.5–5.2)
Alkaline Phosphatase: 86 U/L (ref 39–117)
BILIRUBIN DIRECT: 0.3 mg/dL (ref 0.0–0.3)
BILIRUBIN TOTAL: 2.8 mg/dL — AB (ref 0.3–1.2)
Indirect Bilirubin: 2.5 mg/dL — ABNORMAL HIGH (ref 0.3–0.9)
Total Protein: 7.6 g/dL (ref 6.0–8.3)

## 2013-11-08 LAB — I-STAT TROPONIN, ED: Troponin i, poc: 0 ng/mL (ref 0.00–0.08)

## 2013-11-08 MED ORDER — SODIUM CHLORIDE 0.9 % IV BOLUS (SEPSIS)
1000.0000 mL | Freq: Once | INTRAVENOUS | Status: AC
  Administered 2013-11-08: 1000 mL via INTRAVENOUS

## 2013-11-08 MED ORDER — SERTRALINE HCL 50 MG PO TABS
50.0000 mg | ORAL_TABLET | Freq: Every evening | ORAL | Status: DC
  Administered 2013-11-09 – 2013-11-10 (×2): 50 mg via ORAL
  Filled 2013-11-08 (×3): qty 1

## 2013-11-08 MED ORDER — HYDROMORPHONE HCL PF 2 MG/ML IJ SOLN
2.0000 mg | Freq: Once | INTRAMUSCULAR | Status: AC
  Administered 2013-11-08: 2 mg via INTRAVENOUS
  Filled 2013-11-08: qty 1

## 2013-11-08 MED ORDER — LORAZEPAM 1 MG PO TABS
1.0000 mg | ORAL_TABLET | Freq: Three times a day (TID) | ORAL | Status: DC | PRN
  Administered 2013-11-09: 1 mg via ORAL
  Filled 2013-11-08: qty 1

## 2013-11-08 MED ORDER — MOMETASONE FURO-FORMOTEROL FUM 100-5 MCG/ACT IN AERO
2.0000 | INHALATION_SPRAY | Freq: Two times a day (BID) | RESPIRATORY_TRACT | Status: DC
  Administered 2013-11-09 – 2013-11-11 (×6): 2 via RESPIRATORY_TRACT
  Filled 2013-11-08: qty 8.8

## 2013-11-08 MED ORDER — ZOLPIDEM TARTRATE 5 MG PO TABS
5.0000 mg | ORAL_TABLET | Freq: Every evening | ORAL | Status: DC | PRN
  Administered 2013-11-09: 5 mg via ORAL
  Filled 2013-11-08: qty 1

## 2013-11-08 MED ORDER — HYDROMORPHONE HCL PF 1 MG/ML IJ SOLN
1.0000 mg | Freq: Once | INTRAMUSCULAR | Status: AC
  Administered 2013-11-08: 1 mg via INTRAVENOUS
  Filled 2013-11-08: qty 1

## 2013-11-08 MED ORDER — PROMETHAZINE HCL 25 MG RE SUPP: 12.5000 mg | RECTAL | Status: DC | PRN

## 2013-11-08 MED ORDER — PANTOPRAZOLE SODIUM 40 MG PO TBEC
80.0000 mg | DELAYED_RELEASE_TABLET | Freq: Every day | ORAL | Status: DC
  Administered 2013-11-09 – 2013-11-11 (×3): 80 mg via ORAL
  Filled 2013-11-08 (×3): qty 2

## 2013-11-08 MED ORDER — FLUTICASONE PROPIONATE 50 MCG/ACT NA SUSP
2.0000 | Freq: Every day | NASAL | Status: DC | PRN
  Filled 2013-11-08: qty 16

## 2013-11-08 MED ORDER — HYDROMORPHONE HCL PF 2 MG/ML IJ SOLN
2.0000 mg | Freq: Once | INTRAMUSCULAR | Status: AC
Start: 2013-11-08 — End: 2013-11-08
  Administered 2013-11-08: 2 mg via INTRAVENOUS
  Filled 2013-11-08: qty 1

## 2013-11-08 MED ORDER — LEVALBUTEROL HCL 1.25 MG/0.5ML IN NEBU
1.2500 mg | INHALATION_SOLUTION | Freq: Four times a day (QID) | RESPIRATORY_TRACT | Status: DC
  Administered 2013-11-09 – 2013-11-10 (×8): 1.25 mg via RESPIRATORY_TRACT
  Filled 2013-11-08 (×13): qty 0.5

## 2013-11-08 MED ORDER — IPRATROPIUM-ALBUTEROL 0.5-2.5 (3) MG/3ML IN SOLN
3.0000 mL | Freq: Once | RESPIRATORY_TRACT | Status: AC
  Administered 2013-11-08: 3 mL via RESPIRATORY_TRACT
  Filled 2013-11-08: qty 3

## 2013-11-08 MED ORDER — SORBITOL 70 % SOLN
30.0000 mL | Freq: Every day | Status: DC | PRN
  Filled 2013-11-08: qty 30

## 2013-11-08 MED ORDER — SENNA 8.6 MG PO TABS
2.0000 | ORAL_TABLET | Freq: Every day | ORAL | Status: DC | PRN
  Administered 2013-11-10: 17.2 mg via ORAL
  Filled 2013-11-08 (×2): qty 2

## 2013-11-08 MED ORDER — DOCUSATE SODIUM 100 MG PO CAPS
200.0000 mg | ORAL_CAPSULE | Freq: Two times a day (BID) | ORAL | Status: DC
  Administered 2013-11-09 – 2013-11-11 (×6): 200 mg via ORAL
  Filled 2013-11-08 (×7): qty 2

## 2013-11-08 MED ORDER — HYDROMORPHONE 0.3 MG/ML IV SOLN
INTRAVENOUS | Status: DC
  Administered 2013-11-09 (×2): via INTRAVENOUS
  Administered 2013-11-09: 3.18 mg via INTRAVENOUS
  Administered 2013-11-09: 2.75 mg via INTRAVENOUS
  Administered 2013-11-09: 2.8 mg via INTRAVENOUS
  Administered 2013-11-09: 01:00:00 via INTRAVENOUS
  Administered 2013-11-09: 2.1 mg via INTRAVENOUS
  Administered 2013-11-09: 0.3 mg via INTRAVENOUS
  Administered 2013-11-10 (×2): via INTRAVENOUS
  Administered 2013-11-10: 4.99 mg via INTRAVENOUS
  Administered 2013-11-10: 05:00:00 via INTRAVENOUS
  Administered 2013-11-10: 8.48 mg via INTRAVENOUS
  Administered 2013-11-10: 5.63 mg via INTRAVENOUS
  Administered 2013-11-10: 2 mg via INTRAVENOUS
  Filled 2013-11-08 (×6): qty 25

## 2013-11-08 MED ORDER — HYDROXYUREA 500 MG PO CAPS
1500.0000 mg | ORAL_CAPSULE | Freq: Every morning | ORAL | Status: DC
Start: 2013-11-09 — End: 2013-11-11
  Administered 2013-11-09 – 2013-11-11 (×3): 1500 mg via ORAL
  Filled 2013-11-08 (×3): qty 3

## 2013-11-08 MED ORDER — KETOROLAC TROMETHAMINE 30 MG/ML IJ SOLN
30.0000 mg | Freq: Four times a day (QID) | INTRAMUSCULAR | Status: DC
  Administered 2013-11-09 – 2013-11-10 (×8): 30 mg via INTRAVENOUS
  Filled 2013-11-08 (×3): qty 1
  Filled 2013-11-08 (×2): qty 2
  Filled 2013-11-08 (×7): qty 1

## 2013-11-08 MED ORDER — SODIUM CHLORIDE 0.9 % IV SOLN: Freq: Once | INTRAVENOUS | Status: DC

## 2013-11-08 MED ORDER — LEVALBUTEROL HCL 1.25 MG/0.5ML IN NEBU
1.2500 mg | INHALATION_SOLUTION | RESPIRATORY_TRACT | Status: DC | PRN
  Filled 2013-11-08: qty 0.5

## 2013-11-08 MED ORDER — ENOXAPARIN SODIUM 40 MG/0.4ML ~~LOC~~ SOLN
40.0000 mg | Freq: Every day | SUBCUTANEOUS | Status: DC
  Administered 2013-11-09 – 2013-11-10 (×3): 40 mg via SUBCUTANEOUS
  Filled 2013-11-08 (×4): qty 0.4

## 2013-11-08 MED ORDER — MAGNESIUM CITRATE PO SOLN: 1.0000 | Freq: Once | ORAL | Status: AC | PRN

## 2013-11-08 MED ORDER — DIPHENHYDRAMINE HCL 12.5 MG/5ML PO ELIX
12.5000 mg | ORAL_SOLUTION | Freq: Four times a day (QID) | ORAL | Status: DC | PRN
  Administered 2013-11-09: 12.5 mg via ORAL
  Filled 2013-11-08: qty 5

## 2013-11-08 MED ORDER — DIPHENHYDRAMINE HCL 50 MG/ML IJ SOLN
25.0000 mg | Freq: Once | INTRAMUSCULAR | Status: AC
  Administered 2013-11-08: 25 mg via INTRAVENOUS
  Filled 2013-11-08: qty 1

## 2013-11-08 MED ORDER — POLYETHYLENE GLYCOL 3350 17 G PO PACK
17.0000 g | PACK | Freq: Every day | ORAL | Status: DC | PRN
  Administered 2013-11-09: 17 g via ORAL
  Filled 2013-11-08 (×2): qty 1

## 2013-11-08 MED ORDER — IPRATROPIUM BROMIDE 0.02 % IN SOLN
0.5000 mg | Freq: Four times a day (QID) | RESPIRATORY_TRACT | Status: DC
  Administered 2013-11-09 – 2013-11-10 (×8): 0.5 mg via RESPIRATORY_TRACT
  Filled 2013-11-08 (×8): qty 2.5

## 2013-11-08 MED ORDER — ONDANSETRON HCL 4 MG/2ML IJ SOLN: 4.0000 mg | Freq: Four times a day (QID) | INTRAMUSCULAR | Status: DC | PRN

## 2013-11-08 MED ORDER — DIPHENHYDRAMINE HCL 50 MG/ML IJ SOLN
12.5000 mg | Freq: Four times a day (QID) | INTRAMUSCULAR | Status: DC | PRN
  Administered 2013-11-09 – 2013-11-10 (×2): 12.5 mg via INTRAVENOUS
  Filled 2013-11-08 (×3): qty 1

## 2013-11-08 MED ORDER — SODIUM CHLORIDE 0.9 % IJ SOLN: 9.0000 mL | INTRAMUSCULAR | Status: DC | PRN

## 2013-11-08 MED ORDER — ALBUTEROL SULFATE (2.5 MG/3ML) 0.083% IN NEBU
5.0000 mg | INHALATION_SOLUTION | Freq: Once | RESPIRATORY_TRACT | Status: AC
  Administered 2013-11-08: 5 mg via RESPIRATORY_TRACT
  Filled 2013-11-08: qty 6

## 2013-11-08 MED ORDER — PROMETHAZINE HCL 25 MG PO TABS: 12.5000 mg | ORAL_TABLET | ORAL | Status: DC | PRN

## 2013-11-08 MED ORDER — FOLIC ACID 1 MG PO TABS
1.0000 mg | ORAL_TABLET | Freq: Every morning | ORAL | Status: DC
  Administered 2013-11-09 – 2013-11-11 (×3): 1 mg via ORAL
  Filled 2013-11-08 (×3): qty 1

## 2013-11-08 MED ORDER — METHADONE HCL 5 MG PO TABS
5.0000 mg | ORAL_TABLET | Freq: Every day | ORAL | Status: DC
  Administered 2013-11-09 – 2013-11-10 (×3): 5 mg via ORAL
  Filled 2013-11-08 (×3): qty 1

## 2013-11-08 MED ORDER — NALOXONE HCL 0.4 MG/ML IJ SOLN
0.4000 mg | INTRAMUSCULAR | Status: DC | PRN
Start: 2013-11-08 — End: 2013-11-10

## 2013-11-08 MED ORDER — VITAMIN D (ERGOCALCIFEROL) 1.25 MG (50000 UNIT) PO CAPS
50000.0000 [IU] | ORAL_CAPSULE | ORAL | Status: DC
  Administered 2013-11-09: 50000 [IU] via ORAL
  Filled 2013-11-08: qty 1

## 2013-11-08 NOTE — ED Provider Notes (Signed)
CSN: [id number]     Arrival date & time 11/08/13  8676 History   First MD Initiated Contact with Patient 11/08/13 1903     Chief Complaint  Patient presents with  . Sickle Cell Pain Crisis  . Wheezing     (Consider location/radiation/quality/duration/timing/severity/associated sxs/prior Treatment) Patient is a 43 y.o. female presenting with sickle cell pain. The history is provided by the patient. No language interpreter was used.  Sickle Cell Pain Crisis Location:  Upper extremity and lower extremity Severity:  Severe Onset quality:  Gradual Duration:  2 days Similar to previous crisis episodes: yes   Timing:  Constant Progression:  Worsening Chronicity:  Recurrent Usual hemoglobin level:  9 History of pulmonary emboli: no   Context: not change in medication, not non-compliance and not stress   Relieved by:  Nothing Worsened by:  Movement and activity Ineffective treatments:  None tried Associated symptoms: shortness of breath and wheezing   Associated symptoms: no chest pain, no congestion, no cough, no fatigue, no fever, no headaches, no nausea, no sore throat, no swelling of legs, no vision change and no vomiting   Shortness of breath:    Timing:  Intermittent   Progression:  Waxing and waning Wheezing:    Severity:  Moderate   Duration:  1 day   Timing:  Intermittent   Progression:  Waxing and waning   Chronicity:  Recurrent Risk factors: no frequent admissions for fever, no frequent admissions for pain and no frequent pain crises     Past Medical History  Diagnosis Date  . PMDD (premenstrual dysphoric disorder)   . Sickle cell anemia   . Sickle cell disease without crisis 07/15/2011  . Migraine with aura 07/15/2011   Past Surgical History  Procedure Laterality Date  . Cesarean section     Family History  Problem Relation Age of Onset  . Hypertension Mother    History  Substance Use Topics  . Smoking status: Never Smoker   . Smokeless tobacco: Not on file   . Alcohol Use: No   OB History   Grav Para Term Preterm Abortions TAB SAB Ect Mult Living                 Review of Systems  Constitutional: Negative for fever, chills, diaphoresis, activity change, appetite change and fatigue.  HENT: Negative for congestion, facial swelling, rhinorrhea and sore throat.   Eyes: Negative for photophobia and discharge.  Respiratory: Positive for shortness of breath and wheezing. Negative for cough and chest tightness.   Cardiovascular: Negative for chest pain, palpitations and leg swelling.  Gastrointestinal: Negative for nausea, vomiting, abdominal pain and diarrhea.  Endocrine: Negative for polydipsia and polyuria.  Genitourinary: Negative for dysuria, frequency, difficulty urinating and pelvic pain.  Musculoskeletal: Negative for arthralgias, back pain, neck pain and neck stiffness.  Skin: Negative for color change and wound.  Allergic/Immunologic: Negative for immunocompromised state.  Neurological: Negative for facial asymmetry, weakness, numbness and headaches.  Hematological: Does not bruise/bleed easily.  Psychiatric/Behavioral: Negative for confusion and agitation.      Allergies  Sulfa antibiotics  Home Medications   Prior to Admission medications   Medication Sig Start Date End Date Taking? Authorizing Provider  albuterol (PROVENTIL HFA;VENTOLIN HFA) 108 (90 BASE) MCG/ACT inhaler Inhale 2 puffs into the lungs every 6 (six) hours as needed for wheezing or shortness of breath.   Yes Historical Provider, MD  fluticasone (FLONASE) 50 MCG/ACT nasal spray Place 2 sprays into both nostrils daily as needed  for allergies or rhinitis.   Yes Historical Provider, MD  folic acid (FOLVITE) 1 MG tablet Take 1 mg by mouth every morning.   Yes Historical Provider, MD  hydroxyurea (HYDREA) 500 MG capsule Take 1,500 mg by mouth every morning. May take with food to minimize GI side effects.   Yes Historical Provider, MD  LORazepam (ATIVAN) 1 MG tablet  Take 1 mg by mouth every 8 (eight) hours as needed for anxiety.   Yes Historical Provider, MD  methadone (DOLOPHINE) 5 MG tablet Take 5 mg by mouth at bedtime.   Yes Historical Provider, MD  morphine (MSIR) 15 MG tablet Take 22.5 mg by mouth every 4 (four) hours as needed for severe pain.   Yes Historical Provider, MD  omeprazole (PRILOSEC) 40 MG capsule Take 40 mg by mouth every evening.   Yes Historical Provider, MD  polyethylene glycol (MIRALAX / GLYCOLAX) packet Take 17 g by mouth 3 (three) times a week.   Yes Historical Provider, MD  senna (SENOKOT) 8.6 MG TABS tablet Take 2 tablets by mouth daily as needed for mild constipation.   Yes Historical Provider, MD  sertraline (ZOLOFT) 50 MG tablet Take 50 mg by mouth every evening.   Yes Historical Provider, MD  Vitamin D, Ergocalciferol, (DRISDOL) 50000 UNITS CAPS capsule Take 50,000 Units by mouth every Tuesday.   Yes Historical Provider, MD  zolpidem (AMBIEN) 5 MG tablet Take 5 mg by mouth at bedtime as needed for sleep.   Yes Historical Provider, MD   BP 130/75  Pulse 95  Resp 24  SpO2 98%  LMP 10/19/2013 Physical Exam  Constitutional: She is oriented to person, place, and time. She appears well-developed and well-nourished. No distress.  HENT:  Head: Normocephalic and atraumatic.  Mouth/Throat: No oropharyngeal exudate.  Eyes: Pupils are equal, round, and reactive to light.  Neck: Normal range of motion. Neck supple.  Cardiovascular: Normal rate, regular rhythm and normal heart sounds.  Exam reveals no gallop and no friction rub.   No murmur heard. Pulmonary/Chest: Effort normal and breath sounds normal. No respiratory distress. She has no wheezes. She has no rales.  Abdominal: Soft. Bowel sounds are normal. She exhibits no distension and no mass. There is no tenderness. There is no rebound and no guarding.  Musculoskeletal: Normal range of motion. She exhibits tenderness (generalized tenderness of legs). She exhibits no edema.   Neurological: She is alert and oriented to person, place, and time.  Skin: Skin is warm and dry.  Psychiatric: She has a normal mood and affect.    ED Course  Procedures (including critical care time) Labs Review Labs Reviewed  CBC WITH DIFFERENTIAL - Abnormal; Notable for the following:    RBC 3.68 (*)    Hemoglobin 9.0 (*)    HCT 27.6 (*)    MCV 75.0 (*)    MCH 24.5 (*)    RDW 20.0 (*)    Platelets 122 (*)    All other components within normal limits  BASIC METABOLIC PANEL - Abnormal; Notable for the following:    Glucose, Bld 125 (*)    All other components within normal limits  RETICULOCYTES - Abnormal; Notable for the following:    Retic Ct Pct 8.2 (*)    RBC. 3.68 (*)    Retic Count, Manual 301.8 (*)    All other components within normal limits  Randolm Idol, ED    Imaging Review Dg Chest Port 1 View  11/08/2013   CLINICAL DATA:  Shortness  of breath. Current history of sickle cell anemia.  EXAM: PORTABLE CHEST - 1 VIEW  COMPARISON:  Two-view chest x-ray 01/25/2013, 12/26/2012, 12/23/2012. Portable chest x-ray 12/06/2011.  FINDINGS: Cardiac silhouette upper normal in size for AP portable technique, unchanged. Hilar and mediastinal contours unremarkable. Lungs clear. Bronchovascular markings normal. Pulmonary vascularity normal. No visible pleural effusions. No pneumothorax.  IMPRESSION: No acute cardiopulmonary disease.   Electronically Signed   By: Evangeline Dakin M.D.   On: 11/08/2013 19:25     EKG Interpretation   Date/Time:  Monday November 08 2013 18:53:35 EDT Ventricular Rate:  117 PR Interval:  133 QRS Duration: 81 QT Interval:  344 QTC Calculation: 480 R Axis:   22 Text Interpretation:  Sinus tachycardia Low voltage, precordial leads RSR'  in V1 or V2, probably normal variant Borderline abnrm T, anterolateral  leads Baseline wander in lead(s) V6 No significant change since last  tracing Confirmed by [place] [phone]) on 11/08/2013 7:03:39 PM       MDM   Final diagnoses:  Sickle cell pain crisis  Wheezing    Pt is a 43 y.o. female with Pmhx as above who presents with [facility] with generalized pain for 2 days. Today, she began to also have SOB. No CP, fever, cough, numbness, weakness. On PT, pt tachycardic, wheezing, but has nml O2 sats and speaking in full sentences. CXR nml. Hg 9, retic. elevated plts low. BMP noncontributory.   Wheezing & SOB resolved after 2 neb treatments & HR improved. Pain only improved from 8/10 to 6/10 after a total of 5mg  IV dilaudid.  [facility] consulted for admission.         Neta Ehlers, MD 11/08/13 2248

## 2013-11-08 NOTE — H&P (Signed)
[facility] History and Physical  Cassie Montgomery ZHY:[id number] DOB: [birth year] DOA: 11/08/2013  Referring physician:  Wilkie Aye PCP:  MATTHEWS,MICHELLE A., MD   Chief Complaint:  Wheezing and pain crisis  HPI:  The patient is a 43 y.o. year-old female with history of HgSC, chronic pain, anxiety and depression, probable asthma although never formally diagnosed who presents with wheezing and pain crisis.  Her last appointment with Doctor Zigmund Daniel on 5/28 at which time she was endorsing chronic pain that was done to a 6/10 after treatment with methadone and morphine.  She received a refill of her methadone on 6/19 and a refill of her morphine and Ativan on 6/25.  The patient was last at their baseline health two days ago.  On Sunday, she developed bilateral lower extremity pain, left greater than right but rated 10/10 both legs.  Left leg hurts from lateral ankle to the knee, around the knee and in a band mid-way up with thigh.  Right leg hurts around the knee.  No swelling, erythema, or warmth.  Pain is similar to previous pain crises.  She started taking ibuprofen 800mg  every 8 hours, but the pain persisted.  She continued to take her morphine 22-1/2 mg every 4 hours and her methadone.  Also on Sunday, she developed increased cough with chest tightness and shortness of breath. She felt wheezy. She uses Xopenex inhaler at home as needed which helps the symptoms. She states she uses this frequently over the course of the month. She has almost nightly and daily cough at baseline.  She had a history of childhood asthma and all 3 of her children have asthma. She was hospitalized numerous times with asthma exacerbations as a child.    In the emergency department, she was initially tachypneic to the mid 20s and tachycardic to the 120s. Her blood pressure and oxygen saturation were stable.  Her hemoglobin is near baseline, and because of variability in her platelet count over the last 6 months, and  it is unclear if she is at her baseline platelet level. She has had periods of transient thrombocytopenia previously.  Her reticulocyte count is elevated from baseline at 8.2% compared to usual 4-5%.  Liver function tests are her baseline. Her chest x-ray was unremarkable. Because she was wheezing, she was given albuterol and do a neb which improved her shortness of breath and chest tightness. She received a total of 5 mg of IV Dilaudid which improved her pain score to 6.5/10. She is being admitted for pain crisis.  Review of Systems:  General:  Denies fevers, chills, weight loss or gain HEENT:  Denies changes to hearing and vision, rhinorrhea, sinus congestion, sore throat CV:  Denies palpitations, lower extremity edema.  PULM:  Per history of present illness   GI:  Denies nausea, vomiting, constipation, diarrhea.   GU:  Denies dysuria, frequency, urgency ENDO:  Denies polyuria, polydipsia.   HEME:  Denies hematemesis, blood in stools, melena, abnormal bruising or bleeding.  LYMPH:  Denies lymphadenopathy.   MSK:  Per history of present illness DERM:  Recent bruise on her right thigh and a burn from spilling cooked food on her left shin  NEURO:  Denies focal numbness, weakness, slurred speech, confusion, facial droop.  PSYCH:  Chronic anxiety and depression.  Denies suicidal/homicidal ideation  Past Medical History  Diagnosis Date  . PMDD (premenstrual dysphoric disorder)   . Sickle cell anemia   . Sickle cell disease without crisis 07/15/2011  . Migraine with aura 07/15/2011  .  GERD (gastroesophageal reflux disease)   . Asthma in adult     probable  . Depression    Past Surgical History  Procedure Laterality Date  . Cesarean section      2   Social History:  reports that she has never smoked. She does not have any smokeless tobacco history on file. She reports that she does not drink alcohol or use illicit drugs. Uses a cane occasionally.  Does not work.    Allergies  Allergen  Reactions  . Sulfa Antibiotics Other (See Comments)    Increased bilirubin    Family History  Problem Relation Age of Onset  . Hypertension Mother   . Asthma Son   . Asthma Son   . Asthma Daughter      Prior to Admission medications   Medication Sig Start Date End Date Taking? Authorizing Keirstyn Aydt  albuterol (PROVENTIL HFA;VENTOLIN HFA) 108 (90 BASE) MCG/ACT inhaler Inhale 2 puffs into the lungs every 6 (six) hours as needed for wheezing or shortness of breath.   Yes Historical Jakeem Grape, MD  fluticasone (FLONASE) 50 MCG/ACT nasal spray Place 2 sprays into both nostrils daily as needed for allergies or rhinitis.   Yes Historical Talton Delpriore, MD  folic acid (FOLVITE) 1 MG tablet Take 1 mg by mouth every morning.   Yes Historical Kay Ricciuti, MD  hydroxyurea (HYDREA) 500 MG capsule Take 1,500 mg by mouth every morning. May take with food to minimize GI side effects.   Yes Historical Jackson Coffield, MD  LORazepam (ATIVAN) 1 MG tablet Take 1 mg by mouth every 8 (eight) hours as needed for anxiety.   Yes Historical Shonika Kolasinski, MD  methadone (DOLOPHINE) 5 MG tablet Take 5 mg by mouth at bedtime.   Yes Historical Lars Jeziorski, MD  morphine (MSIR) 15 MG tablet Take 22.5 mg by mouth every 4 (four) hours as needed for severe pain.   Yes Historical Wana Mount, MD  omeprazole (PRILOSEC) 40 MG capsule Take 40 mg by mouth every evening.   Yes Historical Roann Merk, MD  polyethylene glycol (MIRALAX / GLYCOLAX) packet Take 17 g by mouth 3 (three) times a week.   Yes Historical Kenza Munar, MD  senna (SENOKOT) 8.6 MG TABS tablet Take 2 tablets by mouth daily as needed for mild constipation.   Yes Historical Jas Betten, MD  sertraline (ZOLOFT) 50 MG tablet Take 50 mg by mouth every evening.   Yes Historical Indria Bishara, MD  Vitamin D, Ergocalciferol, (DRISDOL) 50000 UNITS CAPS capsule Take 50,000 Units by mouth every Tuesday.   Yes Historical Tatayana Beshears, MD  zolpidem (AMBIEN) 5 MG tablet Take 5 mg by mouth at bedtime as needed for sleep.    Yes Historical Bilaal Leib, MD   Physical Exam: Filed Vitals:   11/08/13 2145 11/08/13 2200 11/08/13 2215 11/08/13 2238  BP:  143/67  130/75  Pulse: 102 113 97 95  Resp:    24  SpO2: 97% 97% 96% 98%     General:  BF, no acute distress, M.D. ambulating with ease around her room  Eyes:  PERRL, anicteric, non-injected.  ENT:  Nares clear.  OP clear, non-erythematous without plaques or exudates.  MMM.  Neck:  Supple without TM or JVD.    Lymph:  No cervical, supraclavicular, or submandibular LAD.  Cardiovascular:  RRR, normal S1, S2, without m/r/g.  2+ pulses, warm extremities  Respiratory:  CTA bilaterally without increased WOB.  No focal rales, no wheeze. Good aeration  Abdomen:  NABS.  Soft, ND, mild tenderness to palpation in the right upper  quadrant with deep palpation.    Skin:  No rashes or focal lesions.  Musculoskeletal:  Normal bulk and tone.  No LE edema. No joint effusions, palpable cords, erythema, or warmth of bilateral lower extremities.  Psychiatric:  A & O x 4.  Appropriate affect.  Neurologic:  CN 3-12 intact.  5/5 strength.  Sensation intact.  Labs on Admission:  Basic Metabolic Panel:  Recent Labs Lab 11/08/13 1924  NA 137  K 3.9  CL 97  CO2 28  GLUCOSE 125*  BUN 9  CREATININE 0.66  CALCIUM 9.3   Liver Function Tests:  Recent Labs Lab 11/08/13 1924  AST 17  ALT 12  ALKPHOS 86  BILITOT 2.8*  PROT 7.6  ALBUMIN 4.1   No results found for this basename: LIPASE, AMYLASE,  in the last 168 hours No results found for this basename: AMMONIA,  in the last 168 hours CBC:  Recent Labs Lab 11/08/13 1924  WBC 6.9  NEUTROABS 4.1  HGB 9.0*  HCT 27.6*  MCV 75.0*  PLT 122*   Cardiac Enzymes: No results found for this basename: CKTOTAL, CKMB, CKMBINDEX, TROPONINI,  in the last 168 hours  BNP (last 3 results) No results found for this basename: PROBNP,  in the last 8760 hours CBG: No results found for this basename: GLUCAP,  in the last 168  hours  Radiological Exams on Admission: Dg Chest Port 1 View  11/08/2013   CLINICAL DATA:  Shortness of breath. Current history of sickle cell anemia.  EXAM: PORTABLE CHEST - 1 VIEW  COMPARISON:  Two-view chest x-ray 01/25/2013, 12/26/2012, 12/23/2012. Portable chest x-ray 12/06/2011.  FINDINGS: Cardiac silhouette upper normal in size for AP portable technique, unchanged. Hilar and mediastinal contours unremarkable. Lungs clear. Bronchovascular markings normal. Pulmonary vascularity normal. No visible pleural effusions. No pneumothorax.  IMPRESSION: No acute cardiopulmonary disease.   Electronically Signed   By: Evangeline Dakin M.D.   On: 11/08/2013 19:25    EKG: Independently reviewed. Sinus tachycardia, similar to prior.  Assessment/Plan Principal Problem:   Sickle-cell/Hb-C disease with crisis Active Problems:   Leg pain   Thrombocytopenia   Depression, major   Sickle cell pain crisis   Wheezing  ---  Acute pain crisis, no evidence of acute chest on exam or CXR and labs are near baseline -  Start Dilaudid PCA, custom dose -  Continue methadone -  Hold oral morphine -  Start Toradol -  Continue colace, senna, and miralax -  Transfer to sickle cell service in AM  RUQ discomfort, neg Murphy's and labs at baseline, patient still has gallbladder -  Reexamine in AM and consider [place] if worsening -  Repeat LFTs in AM  Hemoglobin [place], Sickle cell anemia, hgb at baseline -  Continue hydroxyurea -  Continue folic acid  Probable asthma, uses albuterol inhaler 15 times per month with daily and nightly symptoms.  Strong family hx. -  Recommend outpatient pulmonology referral -  Start dulera -  Schedule duonebs -  Consider addition of steroids if above is not relieving symptoms  Depression/anxiety, moderately controlled, continue Zoloft, Ativan, and Ambien  Diet:  regular Access:  PIV IVF:  yes Proph:  lovenox  Code Status: full Family Communication: patient alone Disposition  Plan: Admit to med-surg  Time spent: 60 min SHORT, [facility] Pager [phone]  If 7PM-7AM, please contact night-coverage [email or web address] Password [facility] 11/08/2013, 11:41 PM

## 2013-11-08 NOTE — ED Notes (Signed)
Pt with generalized pain for 2 days.  Respiratory shortness of breath today.

## 2013-11-08 NOTE — ED Notes (Signed)
Pt states pain medications makes her itch.

## 2013-11-08 NOTE — Telephone Encounter (Signed)
Patient with a history sickle cell anemia, HbSC and chronic pain syndrome complaining of pain to right lower extremity. Patient notified clinic stating that she is experiencing 8/10, constant throbbing pain to right lower extremity unrelieved by current pain regimen.  In addition, patient is complaining of shortness of breath and wheezing.  Patient advised to report to the [facility] Emergency Department.     Dorena Dew, FNP

## 2013-11-09 DIAGNOSIS — R143 Flatulence: Secondary | ICD-10-CM

## 2013-11-09 DIAGNOSIS — R141 Gas pain: Secondary | ICD-10-CM | POA: Diagnosis not present

## 2013-11-09 DIAGNOSIS — F329 Major depressive disorder, single episode, unspecified: Secondary | ICD-10-CM | POA: Diagnosis not present

## 2013-11-09 DIAGNOSIS — D5700 Hb-SS disease with crisis, unspecified: Secondary | ICD-10-CM | POA: Diagnosis not present

## 2013-11-09 DIAGNOSIS — G894 Chronic pain syndrome: Secondary | ICD-10-CM | POA: Diagnosis not present

## 2013-11-09 DIAGNOSIS — R142 Eructation: Secondary | ICD-10-CM

## 2013-11-09 LAB — CBC WITH DIFFERENTIAL/PLATELET
BASOS PCT: 0 % (ref 0–1)
Basophils Absolute: 0 10*3/uL (ref 0.0–0.1)
Eosinophils Absolute: 0.2 10*3/uL (ref 0.0–0.7)
Eosinophils Relative: 3 % (ref 0–5)
HCT: 23.6 % — ABNORMAL LOW (ref 36.0–46.0)
Hemoglobin: 7.9 g/dL — ABNORMAL LOW (ref 12.0–15.0)
LYMPHS ABS: 1.5 10*3/uL (ref 0.7–4.0)
LYMPHS PCT: 27 % (ref 12–46)
MCH: 25.3 pg — ABNORMAL LOW (ref 26.0–34.0)
MCHC: 33.5 g/dL (ref 30.0–36.0)
MCV: 75.6 fL — ABNORMAL LOW (ref 78.0–100.0)
MONO ABS: 0.5 10*3/uL (ref 0.1–1.0)
Monocytes Relative: 9 % (ref 3–12)
NEUTROS ABS: 3.4 10*3/uL (ref 1.7–7.7)
NEUTROS PCT: 62 % (ref 43–77)
Platelets: 104 10*3/uL — ABNORMAL LOW (ref 150–400)
RBC: 3.12 MIL/uL — AB (ref 3.87–5.11)
RDW: 19.9 % — ABNORMAL HIGH (ref 11.5–15.5)
WBC: 5.5 10*3/uL (ref 4.0–10.5)

## 2013-11-09 LAB — COMPREHENSIVE METABOLIC PANEL
ALBUMIN: 3.4 g/dL — AB (ref 3.5–5.2)
ALK PHOS: 71 U/L (ref 39–117)
ALT: 10 U/L (ref 0–35)
AST: 15 U/L (ref 0–37)
BILIRUBIN TOTAL: 2.3 mg/dL — AB (ref 0.3–1.2)
BUN: 7 mg/dL (ref 6–23)
CHLORIDE: 101 meq/L (ref 96–112)
CO2: 28 meq/L (ref 19–32)
CREATININE: 0.72 mg/dL (ref 0.50–1.10)
Calcium: 8.6 mg/dL (ref 8.4–10.5)
GFR calc Af Amer: >90 mL/min (ref >90)
GLUCOSE: 153 mg/dL — AB (ref 70–99)
POTASSIUM: 3.5 meq/L — AB (ref 3.7–5.3)
Sodium: 139 mEq/L (ref 137–147)
Total Protein: 6.5 g/dL (ref 6.0–8.3)

## 2013-11-09 LAB — RETICULOCYTES
RBC.: 3.12 MIL/uL — AB (ref 3.87–5.11)
RETIC COUNT ABSOLUTE: 243.4 10*3/uL — AB (ref 19.0–186.0)
Retic Ct Pct: 7.8 % — ABNORMAL HIGH (ref 0.4–3.1)

## 2013-11-09 NOTE — Progress Notes (Signed)
Subjective: 43 year old female with known history of sickle cell disease admitted with sickle cell painful crisis. Patient is on Dilaudid PCA. She has used 32 mg of the Dilaudid in the last 24 hours. She is also on Toradol. She is making right demands of the Dilaudid. No fever no cough no shortness of breath no nausea vomiting or diarrhea  Objective: Vital signs in last 24 hours: Temp:  [98 F (36.7 C)-98.3 F (36.8 C)] 98 F (36.7 C) (06/30 0625) Pulse Rate:  [95-118] 98 (06/30 0625) Resp:  [9-24] 10 (06/30 0816) BP: (123-153)/(57-98) 133/88 mmHg (06/30 0625) SpO2:  [96 %-100 %] 97 % (06/30 0816) Weight:  [114.034 kg (251 lb 6.4 oz)] 114.034 kg (251 lb 6.4 oz) (06/29 2347) Weight change:  Last BM Date: 11/08/13  Intake/Output from previous day: 06/29 0701 - 06/30 0700 In: 19.8 [I.V.:19.8] Out: -  Intake/Output this shift: Total I/O In: 480 [P.O.:480] Out: 400 [Urine:400]  General appearance: alert, cooperative and no distress Throat: lips, mucosa, and tongue normal; teeth and gums normal Neck: no adenopathy, no carotid bruit, no JVD, supple, symmetrical, trachea midline and thyroid not enlarged, symmetric, no tenderness/mass/nodules Back: symmetric, no curvature. ROM normal. No CVA tenderness. Resp: clear to auscultation bilaterally Chest wall: no tenderness Cardio: regular rate and rhythm, S1, S2 normal, no murmur, click, rub or gallop GI: soft, non-tender; bowel sounds normal; no masses,  no organomegaly Extremities: extremities normal, atraumatic, no cyanosis or edema Pulses: 2+ and symmetric Skin: Skin color, texture, turgor normal. No rashes or lesions Neurologic: Grossly normal  Lab Results:  Recent Labs  11/08/13 1924 11/09/13 0645  WBC 6.9 5.5  HGB 9.0* 7.9*  HCT 27.6* 23.6*  PLT 122* 104*   BMET  Recent Labs  11/08/13 1924 11/09/13 0645  NA 137 139  K 3.9 3.5*  CL 97 101  CO2 28 28  GLUCOSE 125* 153*  BUN 9 7  CREATININE 0.66 0.72  CALCIUM 9.3  8.6    Studies/Results: Dg Chest Port 1 View  11/08/2013   CLINICAL DATA:  Shortness of breath. Current history of sickle cell anemia.  EXAM: PORTABLE CHEST - 1 VIEW  COMPARISON:  Two-view chest x-ray 01/25/2013, 12/26/2012, 12/23/2012. Portable chest x-ray 12/06/2011.  FINDINGS: Cardiac silhouette upper normal in size for AP portable technique, unchanged. Hilar and mediastinal contours unremarkable. Lungs clear. Bronchovascular markings normal. Pulmonary vascularity normal. No visible pleural effusions. No pneumothorax.  IMPRESSION: No acute cardiopulmonary disease.   Electronically Signed   By: Evangeline Dakin M.D.   On: 11/08/2013 19:25    Medications: I have reviewed the patient's current medications.  Assessment/Plan: A 43 year old female admitted with sickle cell painful crisis.  #1 sickle cell painful crisis: Patient is currently on Dilaudid PCA and seems to be doing much better. We will continue with current treatment. I will start titrating her medications down tomorrow to prepare for home therapy.  #2 sickle cell anemia: H&H seems to have dropped some. Probably secondary to hemodilution a mild hemolysis. Continue monitoring.  #3 hypokalemia: Continue to replete potassium   #4 Maj. Depression: Continue home medications.  #5 right upper quadrant abdominal pain: Probably secondary to sickle cell disease. Now much improved.  #6 probable asthma: Patient will continue outpatient therapy continue empiric breathing treatment and oxygen in the hospital  LOS: 1 day   GARBA,LAWAL 11/09/2013, 11:35 AM

## 2013-11-10 DIAGNOSIS — G894 Chronic pain syndrome: Secondary | ICD-10-CM | POA: Diagnosis not present

## 2013-11-10 DIAGNOSIS — F329 Major depressive disorder, single episode, unspecified: Secondary | ICD-10-CM | POA: Diagnosis not present

## 2013-11-10 DIAGNOSIS — R141 Gas pain: Secondary | ICD-10-CM | POA: Diagnosis not present

## 2013-11-10 DIAGNOSIS — D5700 Hb-SS disease with crisis, unspecified: Secondary | ICD-10-CM | POA: Diagnosis not present

## 2013-11-10 LAB — COMPREHENSIVE METABOLIC PANEL
ALT: 15 U/L (ref 0–35)
AST: 27 U/L (ref 0–37)
Albumin: 3.2 g/dL — ABNORMAL LOW (ref 3.5–5.2)
Alkaline Phosphatase: 65 U/L (ref 39–117)
BUN: 7 mg/dL (ref 6–23)
CALCIUM: 8.6 mg/dL (ref 8.4–10.5)
CO2: 24 meq/L (ref 19–32)
Chloride: 103 mEq/L (ref 96–112)
Creatinine, Ser: 0.68 mg/dL (ref 0.50–1.10)
GFR calc Af Amer: >90 mL/min (ref >90)
GFR calc non Af Amer: >90 mL/min (ref >90)
Glucose, Bld: 128 mg/dL — ABNORMAL HIGH (ref 70–99)
POTASSIUM: 4 meq/L (ref 3.7–5.3)
SODIUM: 140 meq/L (ref 137–147)
Total Bilirubin: 1.8 mg/dL — ABNORMAL HIGH (ref 0.3–1.2)
Total Protein: 6.3 g/dL (ref 6.0–8.3)

## 2013-11-10 LAB — CBC WITH DIFFERENTIAL/PLATELET
Basophils Absolute: 0 10*3/uL (ref 0.0–0.1)
Basophils Relative: 0 % (ref 0–1)
EOS PCT: 3 % (ref 0–5)
Eosinophils Absolute: 0.2 10*3/uL (ref 0.0–0.7)
HCT: 24.1 % — ABNORMAL LOW (ref 36.0–46.0)
Hemoglobin: 8 g/dL — ABNORMAL LOW (ref 12.0–15.0)
LYMPHS PCT: 26 % (ref 12–46)
Lymphs Abs: 1.5 10*3/uL (ref 0.7–4.0)
MCH: 24.8 pg — ABNORMAL LOW (ref 26.0–34.0)
MCHC: 33.2 g/dL (ref 30.0–36.0)
MCV: 74.8 fL — ABNORMAL LOW (ref 78.0–100.0)
Monocytes Absolute: 0.4 10*3/uL (ref 0.1–1.0)
Monocytes Relative: 7 % (ref 3–12)
NEUTROS PCT: 65 % (ref 43–77)
Neutro Abs: 3.8 10*3/uL (ref 1.7–7.7)
PLATELETS: 94 10*3/uL — AB (ref 150–400)
RBC: 3.22 MIL/uL — ABNORMAL LOW (ref 3.87–5.11)
RDW: 19.9 % — AB (ref 11.5–15.5)
WBC: 5.9 10*3/uL (ref 4.0–10.5)

## 2013-11-10 MED ORDER — DIPHENHYDRAMINE HCL 50 MG/ML IJ SOLN
25.0000 mg | Freq: Four times a day (QID) | INTRAMUSCULAR | Status: DC | PRN
  Administered 2013-11-10 (×2): 25 mg via INTRAVENOUS
  Filled 2013-11-10: qty 1

## 2013-11-10 MED ORDER — MORPHINE SULFATE 15 MG PO TABS
22.5000 mg | ORAL_TABLET | ORAL | Status: DC | PRN
  Administered 2013-11-10 – 2013-11-11 (×3): 22.5 mg via ORAL
  Filled 2013-11-10 (×3): qty 2

## 2013-11-10 NOTE — Progress Notes (Signed)
Patient's IV infiltrated at the beginning of the shift and patient did not want to be re-stuck.  She wanted to try to switch over to oral medications.  Notified NP on call, orders changed, will continue to monitor patient.

## 2013-11-10 NOTE — Progress Notes (Signed)
Subjective: Patient's pain is now down to 7/10. She is still on the Dilaudid PCA and is using about 29 mg in the last 24 hours. Denied any shortness of breath no cough no fever. Denied any nausea vomiting or diarrhea. Objective: Vital signs in last 24 hours: Temp:  [97.3 F (36.3 C)-99 F (37.2 C)] 97.3 F (36.3 C) (07/01 1359) Pulse Rate:  [81-108] 81 (07/01 1359) Resp:  [10-18] 16 (07/01 1359) BP: (122-130)/(64-77) 122/68 mmHg (07/01 1359) SpO2:  [98 %-100 %] 100 % (07/01 1359) Weight change:  Last BM Date: 11/08/13  Intake/Output from previous day: 06/30 0701 - 07/01 0700 In: 1080 [P.O.:1080] Out: 1200 [Urine:1200] Intake/Output this shift: Total I/O In: 1080 [P.O.:1080] Out: -   General appearance: alert, cooperative and no distress Throat: lips, mucosa, and tongue normal; teeth and gums normal Neck: no adenopathy, no carotid bruit, no JVD, supple, symmetrical, trachea midline and thyroid not enlarged, symmetric, no tenderness/mass/nodules Back: symmetric, no curvature. ROM normal. No CVA tenderness. Resp: clear to auscultation bilaterally Chest wall: no tenderness Cardio: regular rate and rhythm, S1, S2 normal, no murmur, click, rub or gallop GI: soft, non-tender; bowel sounds normal; no masses,  no organomegaly Extremities: extremities normal, atraumatic, no cyanosis or edema Pulses: 2+ and symmetric Skin: Skin color, texture, turgor normal. No rashes or lesions Neurologic: Grossly normal  Lab Results:  Recent Labs  11/09/13 0645 11/10/13 0505  WBC 5.5 5.9  HGB 7.9* 8.0*  HCT 23.6* 24.1*  PLT 104* 94*   BMET  Recent Labs  11/09/13 0645 11/10/13 0505  NA 139 140  K 3.5* 4.0  CL 101 103  CO2 28 24  GLUCOSE 153* 128*  BUN 7 7  CREATININE 0.72 0.68  CALCIUM 8.6 8.6    Studies/Results: Dg Chest Port 1 View  11/08/2013   CLINICAL DATA:  Shortness of breath. Current history of sickle cell anemia.  EXAM: PORTABLE CHEST - 1 VIEW  COMPARISON:  Two-view  chest x-ray 01/25/2013, 12/26/2012, 12/23/2012. Portable chest x-ray 12/06/2011.  FINDINGS: Cardiac silhouette upper normal in size for AP portable technique, unchanged. Hilar and mediastinal contours unremarkable. Lungs clear. Bronchovascular markings normal. Pulmonary vascularity normal. No visible pleural effusions. No pneumothorax.  IMPRESSION: No acute cardiopulmonary disease.   Electronically Signed   By: Evangeline Dakin M.D.   On: 11/08/2013 19:25    Medications: I have reviewed the patient's current medications.  Assessment/Plan: A 43 year old female admitted with sickle cell painful crisis.  #1 sickle cell painful crisis: Patient is responding favorably to current therapy. We will decrease the dose of her PCA in preparation for possible discharge tomorrow. Continue her methadone and other oral medications..  #2 sickle cell anemia: H&H now stable. No evidence of hemolytic crisis. We'll monitor overnight  #3 hypokalemia: Resolved   #4 Maj. Depression: Continue home medications.  #5 right upper quadrant abdominal pain: Resolved.  #6 probable asthma: Patient will continue outpatient therapy continue empiric breathing treatment and oxygen in the hospital  LOS: 2 days   GARBA,LAWAL 11/10/2013, 5:24 PM

## 2013-11-11 LAB — COMPREHENSIVE METABOLIC PANEL
ALT: 16 U/L (ref 0–35)
AST: 18 U/L (ref 0–37)
Albumin: 3.2 g/dL — ABNORMAL LOW (ref 3.5–5.2)
Alkaline Phosphatase: 67 U/L (ref 39–117)
Anion gap: 9 (ref 5–15)
BILIRUBIN TOTAL: 2 mg/dL — AB (ref 0.3–1.2)
BUN: 9 mg/dL (ref 6–23)
CHLORIDE: 105 meq/L (ref 96–112)
CO2: 28 meq/L (ref 19–32)
CREATININE: 0.67 mg/dL (ref 0.50–1.10)
Calcium: 8.5 mg/dL (ref 8.4–10.5)
GFR calc Af Amer: >90 mL/min (ref >90)
Glucose, Bld: 132 mg/dL — ABNORMAL HIGH (ref 70–99)
Potassium: 3.8 mEq/L (ref 3.7–5.3)
Sodium: 142 mEq/L (ref 137–147)
Total Protein: 6.4 g/dL (ref 6.0–8.3)

## 2013-11-11 LAB — RETICULOCYTES
RBC.: 3.2 MIL/uL — ABNORMAL LOW (ref 3.87–5.11)
RETIC COUNT ABSOLUTE: 179.2 10*3/uL (ref 19.0–186.0)
Retic Ct Pct: 5.6 % — ABNORMAL HIGH (ref 0.4–3.1)

## 2013-11-11 LAB — CBC WITH DIFFERENTIAL/PLATELET
BASOS ABS: 0 10*3/uL (ref 0.0–0.1)
Basophils Relative: 0 % (ref 0–1)
Eosinophils Absolute: 0.3 10*3/uL (ref 0.0–0.7)
Eosinophils Relative: 4 % (ref 0–5)
HEMATOCRIT: 24 % — AB (ref 36.0–46.0)
Hemoglobin: 7.9 g/dL — ABNORMAL LOW (ref 12.0–15.0)
LYMPHS ABS: 1.2 10*3/uL (ref 0.7–4.0)
Lymphocytes Relative: 20 % (ref 12–46)
MCH: 24.7 pg — ABNORMAL LOW (ref 26.0–34.0)
MCHC: 32.9 g/dL (ref 30.0–36.0)
MCV: 75 fL — ABNORMAL LOW (ref 78.0–100.0)
Monocytes Absolute: 0.4 10*3/uL (ref 0.1–1.0)
Monocytes Relative: 7 % (ref 3–12)
NEUTROS ABS: 4.2 10*3/uL (ref 1.7–7.7)
Neutrophils Relative %: 69 % (ref 43–77)
Platelets: 109 10*3/uL — ABNORMAL LOW (ref 150–400)
RBC: 3.2 MIL/uL — ABNORMAL LOW (ref 3.87–5.11)
RDW: 19.7 % — ABNORMAL HIGH (ref 11.5–15.5)
WBC: 6.1 10*3/uL (ref 4.0–10.5)

## 2013-11-11 MED ORDER — IPRATROPIUM BROMIDE 0.02 % IN SOLN
0.5000 mg | Freq: Three times a day (TID) | RESPIRATORY_TRACT | Status: DC
  Administered 2013-11-11 (×2): 0.5 mg via RESPIRATORY_TRACT
  Filled 2013-11-11 (×2): qty 2.5

## 2013-11-11 MED ORDER — LEVALBUTEROL HCL 1.25 MG/0.5ML IN NEBU
1.2500 mg | INHALATION_SOLUTION | Freq: Three times a day (TID) | RESPIRATORY_TRACT | Status: DC
  Administered 2013-11-11 (×2): 1.25 mg via RESPIRATORY_TRACT
  Filled 2013-11-11 (×4): qty 0.5

## 2013-11-11 NOTE — Progress Notes (Signed)
Assumed care of patient at 2300. Received report from Leslie, [place]. Agree with previous assessment. Will continue to monitor. Blanchard Kelch, RN

## 2013-11-11 NOTE — Discharge Summary (Signed)
Physician Discharge Summary  Patient ID: Cassie Montgomery MRN: [id number] DOB/AGE: 44/14/1972 44 y.o.  Admit date: 11/08/2013 Discharge date: 11/11/2013  Admission Diagnoses:  Discharge Diagnoses:  Principal Problem:   Sickle-cell/Hb-C disease with crisis Active Problems:   Leg pain   Thrombocytopenia   Depression, major   Sickle cell pain crisis   Wheezing   Discharged Condition: good  Hospital Course: Patient admitted with sickle cell painful crisis. She has hemoglobin [place] disease with infrequent flareups. She was placed on Dilaudid PCA pump, Toradol and IV hydration. Also her oral medications where resume. She had initial wheezing and respiratory distress presumably due to broncho- constriction. She was treated with nebulizers in the hospital. She was also continued on her inhaled steroids. Patient was pushed treatment very well. At the time of discharge she was back to her baseline. Her pain level is also down to 3/10. She will follow in the clinic in the next 2 weeks for further therapy.  Consults: None  Significant Diagnostic Studies: labs: CBCs and CMP is monitored. No hemolytic crisis  Treatments: IV hydration, analgesia: Dilaudid and respiratory therapy: albuterol/atropine nebulizer  Discharge Exam: Blood pressure 140/70, pulse 99, temperature 98.3 F (36.8 C), temperature source Oral, resp. rate 16, height 5\' 6"  (1.676 m), weight 114.034 kg (251 lb 6.4 oz), last menstrual period 10/19/2013, SpO2 99.00%. General appearance: alert, cooperative and no distress Eyes: conjunctivae/corneas clear. PERRL, EOM's intact. Fundi benign. Neck: no adenopathy, no carotid bruit, no JVD, supple, symmetrical, trachea midline and thyroid not enlarged, symmetric, no tenderness/mass/nodules Back: symmetric, no curvature. ROM normal. No CVA tenderness. Resp: clear to auscultation bilaterally Chest wall: no tenderness Cardio: regular rate and rhythm, S1, S2 normal, no murmur, click, rub or  gallop GI: soft, non-tender; bowel sounds normal; no masses,  no organomegaly Extremities: extremities normal, atraumatic, no cyanosis or edema Pulses: 2+ and symmetric Skin: Skin color, texture, turgor normal. No rashes or lesions Neurologic: Grossly normal  Disposition: 01-Home or Self Care     Medication List         albuterol 108 (90 BASE) MCG/ACT inhaler  Commonly known as:  PROVENTIL HFA;VENTOLIN HFA  Inhale 2 puffs into the lungs every 6 (six) hours as needed for wheezing or shortness of breath.     fluticasone 50 MCG/ACT nasal spray  Commonly known as:  FLONASE  Place 2 sprays into both nostrils daily as needed for allergies or rhinitis.     folic acid 1 MG tablet  Commonly known as:  FOLVITE  Take 1 mg by mouth every morning.     hydroxyurea 500 MG capsule  Commonly known as:  HYDREA  Take 1,500 mg by mouth every morning. May take with food to minimize GI side effects.     LORazepam 1 MG tablet  Commonly known as:  ATIVAN  Take 1 mg by mouth every 8 (eight) hours as needed for anxiety.     methadone 5 MG tablet  Commonly known as:  DOLOPHINE  Take 5 mg by mouth at bedtime.     morphine 15 MG tablet  Commonly known as:  MSIR  Take 22.5 mg by mouth every 4 (four) hours as needed for severe pain.     omeprazole 40 MG capsule  Commonly known as:  PRILOSEC  Take 40 mg by mouth every evening.     polyethylene glycol packet  Commonly known as:  MIRALAX / GLYCOLAX  Take 17 g by mouth 3 (three) times a week.     senna  8.6 MG Tabs tablet  Commonly known as:  SENOKOT  Take 2 tablets by mouth daily as needed for mild constipation.     sertraline 50 MG tablet  Commonly known as:  ZOLOFT  Take 50 mg by mouth every evening.     Vitamin D (Ergocalciferol) 50000 UNITS Caps capsule  Commonly known as:  DRISDOL  Take 50,000 Units by mouth every Tuesday.     zolpidem 5 MG tablet  Commonly known as:  AMBIEN  Take 5 mg by mouth at bedtime as needed for sleep.          SignedBarbette Merino 11/11/2013, 12:09 PM

## 2013-11-17 ENCOUNTER — Telehealth: Payer: Self-pay | Admitting: Internal Medicine

## 2013-11-17 NOTE — Telephone Encounter (Signed)
Medication refill request to morphine (MSIR) 15 MG tablet / LOV 10/07/2013

## 2013-11-19 MED ORDER — MORPHINE SULFATE 15 MG PO TABS: 22.5000 mg | ORAL_TABLET | ORAL | Status: DC | PRN

## 2013-11-19 NOTE — Telephone Encounter (Signed)
Prescription reordered for MSIR 15 mg #135 tabs. (15 day supply)

## 2013-12-01 ENCOUNTER — Telehealth: Payer: Self-pay | Admitting: Internal Medicine

## 2013-12-01 DIAGNOSIS — D5720 Sickle-cell/Hb-C disease without crisis: Secondary | ICD-10-CM

## 2013-12-01 DIAGNOSIS — F411 Generalized anxiety disorder: Secondary | ICD-10-CM

## 2013-12-01 MED ORDER — MORPHINE SULFATE 15 MG PO TABS: 22.5000 mg | ORAL_TABLET | ORAL | Status: DC | PRN

## 2013-12-01 MED ORDER — LORAZEPAM 1 MG PO TABS: 1.0000 mg | ORAL_TABLET | Freq: Three times a day (TID) | ORAL | Status: DC | PRN

## 2013-12-01 NOTE — Telephone Encounter (Signed)
Refill request for LORazepam (ATIVAN) 1 MG tablet, morphine (MSIR) 15 MG tablet / LOV 10/07/2013

## 2013-12-01 NOTE — Telephone Encounter (Signed)
Prescription refilled for Lorazepam 1 mg #60 with 3 refills and MSIR 15 mg #135 pills.

## 2013-12-02 ENCOUNTER — Other Ambulatory Visit: Payer: Self-pay | Admitting: Internal Medicine

## 2013-12-02 NOTE — Telephone Encounter (Signed)
Zoloft Rx sent.

## 2013-12-02 NOTE — Telephone Encounter (Signed)
zoloft was originally rx'd by other provider. Can you place new rx to send to pharmacy for refill? Please advise. Thanks!  LOV 09/23/2013

## 2013-12-13 ENCOUNTER — Telehealth: Payer: Self-pay | Admitting: Internal Medicine

## 2013-12-13 DIAGNOSIS — D5720 Sickle-cell/Hb-C disease without crisis: Secondary | ICD-10-CM

## 2013-12-13 DIAGNOSIS — G894 Chronic pain syndrome: Secondary | ICD-10-CM

## 2013-12-13 NOTE — Telephone Encounter (Signed)
Refill request for Methadone 5mg . LOV 12/01/2013. Please advise. Thanks!

## 2013-12-14 MED ORDER — METHADONE HCL 5 MG PO TABS: 5.0000 mg | ORAL_TABLET | Freq: Every day | ORAL | Status: DC

## 2013-12-14 NOTE — Telephone Encounter (Signed)
Prescription re-written for methadone 5 mg #30 tabs

## 2013-12-15 ENCOUNTER — Telehealth: Payer: Self-pay | Admitting: Internal Medicine

## 2013-12-15 NOTE — Telephone Encounter (Signed)
Ativan recently refilled #60. Patient stated when refilled in the past the quantity was #90. Please call and advise.

## 2013-12-15 NOTE — Telephone Encounter (Signed)
Dr. Zigmund Daniel patient is requesting to know why Ativan quantity when down to #60 instead of #90? Please advise. Thanks!

## 2013-12-17 ENCOUNTER — Other Ambulatory Visit: Payer: Self-pay | Admitting: Internal Medicine

## 2013-12-17 DIAGNOSIS — D5720 Sickle-cell/Hb-C disease without crisis: Secondary | ICD-10-CM

## 2013-12-17 MED ORDER — MORPHINE SULFATE 15 MG PO TABS: 22.5000 mg | ORAL_TABLET | ORAL | Status: DC | PRN

## 2013-12-17 NOTE — Telephone Encounter (Signed)
Dr. Zigmund Daniel. Patient  States she is at the end of her supply of morphine. She wants to know what is going to be done now in place of this? She does NOT want to pay out of pocket for the oxycodone. Please advise. Thanks!

## 2013-12-17 NOTE — Telephone Encounter (Signed)
The Ativan should be #90 tabs. She can either return the current prescription or I will refill her ativan in 20 days.

## 2013-12-17 NOTE — Telephone Encounter (Signed)
Oxycodone is no longer being covered by her insurance. I am happy to return to Oxycodone if she is willing to pay out of pocket.

## 2013-12-17 NOTE — Progress Notes (Signed)
Prescription refilled for MSIR 15 mg #135 pills.

## 2013-12-22 NOTE — Telephone Encounter (Signed)
Prescription refilled for MS IR 15 mg # 135 tabs

## 2013-12-29 ENCOUNTER — Telehealth: Payer: Self-pay | Admitting: Internal Medicine

## 2013-12-29 DIAGNOSIS — F411 Generalized anxiety disorder: Secondary | ICD-10-CM

## 2013-12-29 NOTE — Telephone Encounter (Signed)
Patient requesting a refill on ativan 1mg  and methadone 5mg . LOV 10/07/2013 with dr. Zigmund Daniel. Please advise. Thanks!

## 2013-12-30 ENCOUNTER — Ambulatory Visit: Payer: Medicare Other | Admitting: Internal Medicine

## 2013-12-30 MED ORDER — LORAZEPAM 1 MG PO TABS: 1.0000 mg | ORAL_TABLET | Freq: Three times a day (TID) | ORAL | Status: DC | PRN

## 2013-12-30 NOTE — Telephone Encounter (Signed)
Will fill Methadone on 01/14/2014, request inappropriate.    Meds ordered this encounter  Medications  . LORazepam (ATIVAN) 1 MG tablet    Sig: Take 1 tablet (1 mg total) by mouth every 8 (eight) hours as needed for anxiety.    Dispense:  60 tablet    Refill:  0    Order Specific Question:  Supervising Provider    Answer:  Liston Alba A [[id number]]  Reviewed [place] Substance Reporting system prior to reorder   Dorena Dew, FNP

## 2014-01-04 ENCOUNTER — Non-Acute Institutional Stay (HOSPITAL_COMMUNITY)
Admission: AD | Admit: 2014-01-04 | Discharge: 2014-01-04 | Disposition: A | Discharge status: Home / Self Care | Payer: Medicare Other | Attending: Internal Medicine | Admitting: Internal Medicine

## 2014-01-04 ENCOUNTER — Encounter (HOSPITAL_COMMUNITY): Payer: Self-pay | Admitting: Internal Medicine

## 2014-01-04 ENCOUNTER — Encounter (HOSPITAL_COMMUNITY): Payer: Self-pay

## 2014-01-04 DIAGNOSIS — R112 Nausea with vomiting, unspecified: Secondary | ICD-10-CM | POA: Diagnosis not present

## 2014-01-04 DIAGNOSIS — D57219 Sickle-cell/Hb-C disease with crisis, unspecified: Secondary | ICD-10-CM | POA: Insufficient documentation

## 2014-01-04 DIAGNOSIS — D5720 Sickle-cell/Hb-C disease without crisis: Secondary | ICD-10-CM

## 2014-01-04 DIAGNOSIS — R062 Wheezing: Secondary | ICD-10-CM | POA: Diagnosis not present

## 2014-01-04 DIAGNOSIS — M79609 Pain in unspecified limb: Secondary | ICD-10-CM | POA: Diagnosis not present

## 2014-01-04 LAB — CBC WITH DIFFERENTIAL/PLATELET
Basophils Absolute: 0 10*3/uL (ref 0.0–0.1)
Basophils Relative: 0 % (ref 0–1)
EOS ABS: 0 10*3/uL (ref 0.0–0.7)
Eosinophils Relative: 0 % (ref 0–5)
HCT: 34.1 % — ABNORMAL LOW (ref 36.0–46.0)
HEMOGLOBIN: 11.8 g/dL — AB (ref 12.0–15.0)
Lymphocytes Relative: 8 % — ABNORMAL LOW (ref 12–46)
Lymphs Abs: 1 10*3/uL (ref 0.7–4.0)
MCH: 24.7 pg — AB (ref 26.0–34.0)
MCHC: 34.6 g/dL (ref 30.0–36.0)
MCV: 71.5 fL — ABNORMAL LOW (ref 78.0–100.0)
MONOS PCT: 2 % — AB (ref 3–12)
Monocytes Absolute: 0.3 10*3/uL (ref 0.1–1.0)
NEUTROS PCT: 90 % — AB (ref 43–77)
Neutro Abs: 12 10*3/uL — ABNORMAL HIGH (ref 1.7–7.7)
Platelets: 177 10*3/uL (ref 150–400)
RBC: 4.77 MIL/uL (ref 3.87–5.11)
RDW: 17.4 % — AB (ref 11.5–15.5)
WBC: 13.3 10*3/uL — ABNORMAL HIGH (ref 4.0–10.5)

## 2014-01-04 LAB — COMPREHENSIVE METABOLIC PANEL
ALT: 8 U/L (ref 0–35)
ANION GAP: 16 — AB (ref 5–15)
AST: 13 U/L (ref 0–37)
Albumin: 4.7 g/dL (ref 3.5–5.2)
Alkaline Phosphatase: 71 U/L (ref 39–117)
BUN: 17 mg/dL (ref 6–23)
CHLORIDE: 100 meq/L (ref 96–112)
CO2: 23 meq/L (ref 19–32)
CREATININE: 0.88 mg/dL (ref 0.50–1.10)
Calcium: 10.1 mg/dL (ref 8.4–10.5)
GFR calc Af Amer: >90 mL/min (ref >90)
GFR, EST NON AFRICAN AMERICAN: 79 mL/min — AB (ref >90)
Glucose, Bld: 170 mg/dL — ABNORMAL HIGH (ref 70–99)
Potassium: 3.9 mEq/L (ref 3.7–5.3)
Sodium: 139 mEq/L (ref 137–147)
Total Bilirubin: 3 mg/dL — ABNORMAL HIGH (ref 0.3–1.2)
Total Protein: 8.9 g/dL — ABNORMAL HIGH (ref 6.0–8.3)

## 2014-01-04 LAB — RETICULOCYTES
RBC.: 4.77 MIL/uL (ref 3.87–5.11)
Retic Count, Absolute: 152.6 10*3/uL (ref 19.0–186.0)
Retic Ct Pct: 3.2 % — ABNORMAL HIGH (ref 0.4–3.1)

## 2014-01-04 MED ORDER — HYDROMORPHONE 2 MG/ML HIGH CONCENTRATION IV PCA SOLN
INTRAVENOUS | Status: DC
  Administered 2014-01-04: 10:00:00 via INTRAVENOUS
  Administered 2014-01-04: 1 mg via INTRAVENOUS
  Filled 2014-01-04: qty 25

## 2014-01-04 MED ORDER — HYDROMORPHONE HCL PF 2 MG/ML IJ SOLN
3.5000 mg | Freq: Once | INTRAMUSCULAR | Status: AC
  Administered 2014-01-04: 3.5 mg via INTRAVENOUS
  Filled 2014-01-04: qty 2

## 2014-01-04 MED ORDER — NALOXONE HCL 0.4 MG/ML IJ SOLN: 0.4000 mg | INTRAMUSCULAR | Status: DC | PRN

## 2014-01-04 MED ORDER — DEXTROSE-NACL 5-0.45 % IV SOLN
INTRAVENOUS | Status: DC
  Administered 2014-01-04: 09:00:00 via INTRAVENOUS

## 2014-01-04 MED ORDER — ONDANSETRON HCL 4 MG/2ML IJ SOLN
4.0000 mg | Freq: Once | INTRAMUSCULAR | Status: AC
  Administered 2014-01-04: 4 mg via INTRAVENOUS
  Filled 2014-01-04: qty 2

## 2014-01-04 MED ORDER — HYDROMORPHONE HCL PF 2 MG/ML IJ SOLN
2.5000 mg | Freq: Once | INTRAMUSCULAR | Status: AC
Start: 2014-01-04 — End: 2014-01-04
  Administered 2014-01-04: 2.5 mg via INTRAVENOUS
  Filled 2014-01-04: qty 2

## 2014-01-04 MED ORDER — HYDROMORPHONE HCL PF 2 MG/ML IJ SOLN
3.0000 mg | Freq: Once | INTRAMUSCULAR | Status: AC
  Administered 2014-01-04: 3 mg via INTRAVENOUS
  Filled 2014-01-04: qty 2

## 2014-01-04 MED ORDER — ONDANSETRON HCL 4 MG/2ML IJ SOLN: 4.0000 mg | Freq: Four times a day (QID) | INTRAMUSCULAR | Status: DC | PRN

## 2014-01-04 MED ORDER — MORPHINE SULFATE 15 MG PO TABS
22.5000 mg | ORAL_TABLET | Freq: Once | ORAL | Status: AC
  Administered 2014-01-04: 22.5 mg via ORAL
  Filled 2014-01-04: qty 2

## 2014-01-04 MED ORDER — DIPHENHYDRAMINE HCL 12.5 MG/5ML PO ELIX: 12.5000 mg | ORAL_SOLUTION | Freq: Four times a day (QID) | ORAL | Status: DC | PRN

## 2014-01-04 MED ORDER — KETOROLAC TROMETHAMINE 30 MG/ML IJ SOLN: 30.0000 mg | Freq: Four times a day (QID) | INTRAMUSCULAR | Status: DC

## 2014-01-04 MED ORDER — SODIUM CHLORIDE 0.9 % IJ SOLN: 9.0000 mL | INTRAMUSCULAR | Status: DC | PRN

## 2014-01-04 MED ORDER — MORPHINE SULFATE 15 MG PO TABS: 22.5000 mg | ORAL_TABLET | ORAL | Status: DC | PRN

## 2014-01-04 MED ORDER — DIPHENHYDRAMINE HCL 25 MG PO CAPS: 25.0000 mg | ORAL_CAPSULE | Freq: Four times a day (QID) | ORAL | Status: DC | PRN

## 2014-01-04 MED ORDER — DIPHENHYDRAMINE HCL 50 MG/ML IJ SOLN: 12.5000 mg | Freq: Four times a day (QID) | INTRAMUSCULAR | Status: DC | PRN

## 2014-01-04 MED ORDER — KETOROLAC TROMETHAMINE 30 MG/ML IJ SOLN
30.0000 mg | Freq: Once | INTRAMUSCULAR | Status: AC
  Administered 2014-01-04: 30 mg via INTRAVENOUS
  Filled 2014-01-04: qty 1

## 2014-01-04 MED ORDER — HYDROXYUREA 500 MG PO CAPS: 1500.0000 mg | ORAL_CAPSULE | Freq: Every morning | ORAL | Status: DC

## 2014-01-04 NOTE — Progress Notes (Signed)
Pt arrived at day hospital this AM requesting prescription pick-up; pt verbalizes having pain in arms and legs; states has nausea and an episode of vomiting; pt denies diarrhea, CP, SOB, or abdominal pain; MD notified and pt to be admitted to day hospital for evaluation

## 2014-01-04 NOTE — Progress Notes (Signed)
Pt states can control pain level with home medications; pt discharged to home; discharge instructions given, explained, and signed; all questions answered; pt alert and oriented; no complications noted

## 2014-01-04 NOTE — H&P (Signed)
[facility] History and Physical  Cassie Montgomery RUE:[id number] DOB: 1970/11/28 DOA: 01/04/2014   PCP: Anwar Crill A., MD   Chief Complaint: Pain in legs and back x 3 days  HPI:Pt known to me with Cassie Montgomery who is well controlled on her usual analgesic regimen and has rare hospitalizations. She presented to day c/o pain in legs and back x 3 days which she described as throbbing in nature and at a level of 8/10. Pt states that this is typical of her pain associated with vaso-occlusive episode. She also had nausea and emesis which caused inability to take her long acting Methadone. She reports emesis is non-bilious and non-hematematous.   She feels that this vaso-occlusive episode was triggered by her recent trip to [place] where she drove to [place] and back. Pt was at the end of her Prescription thus was without her short acting medications and called a request in for her Methadone instead of her Morphine Sulfate. Her Methadone is not due until 01/14/2014, and patient reports that she has sufficient Methadone until due date.    Review of Systems:  Constitutional: No weight loss, night sweats, Fevers, chills, fatigue.  HEENT: No headaches, dizziness, seizures, vision changes, difficulty swallowing,Tooth/dental problems,Sore throat, No sneezing, itching, ear ache, nasal congestion, post nasal drip,  Cardio-vascular: No chest pain, Orthopnea, PND, swelling in lower extremities, anasarca, dizziness, palpitations  GI: No heartburn, indigestion, abdominal pain, diarrhea, change in bowel habits, loss of appetite  Resp: No shortness of breath with exertion or at rest. No excess mucus, no productive cough, No non-productive cough, No coughing up of blood.No change in color of mucus.No wheezing.No chest wall deformity  Skin: no rash or lesions.  GU: no dysuria, change in color of urine, no urgency or frequency. No flank pain.  Psych: No change in mood or affect. No depression or anxiety. No  memory loss.    Past Medical History  Diagnosis Date  . PMDD (premenstrual dysphoric disorder)   . Sickle cell anemia   . Sickle cell disease without crisis 07/15/2011  . Migraine with aura 07/15/2011  . GERD (gastroesophageal reflux disease)   . Asthma in adult     probable  . Depression    Past Surgical History  Procedure Laterality Date  . Cesarean section      2   Social History:  reports that she has never smoked. She does not have any smokeless tobacco history on file. She reports that she does not drink alcohol or use illicit drugs.  Allergies  Allergen Reactions  . Sulfa Antibiotics Other (See Comments)    Increased bilirubin    Family History  Problem Relation Age of Onset  . Hypertension Mother   . Asthma Son   . Asthma Son   . Asthma Daughter     Prior to Admission medications   Medication Sig Start Date End Date Taking? Authorizing Provider  albuterol (PROVENTIL HFA;VENTOLIN HFA) 108 (90 BASE) MCG/ACT inhaler Inhale 2 puffs into the lungs every 6 (six) hours as needed for wheezing or shortness of breath.   Yes Historical Provider, MD  folic acid (FOLVITE) 1 MG tablet Take 1 mg by mouth every morning.   Yes Historical Provider, MD  hydroxyurea (HYDREA) 500 MG capsule Take 1,500 mg by mouth every morning. May take with food to minimize GI side effects.   Yes Historical Provider, MD  LORazepam (ATIVAN) 1 MG tablet Take 1 tablet (1 mg total) by mouth every 8 (eight) hours as needed  for anxiety. 12/30/13  Yes Dorena Dew, FNP  methadone (DOLOPHINE) 5 MG tablet Take 1 tablet (5 mg total) by mouth at bedtime. 12/14/13  Yes [name], MD  morphine (MSIR) 15 MG tablet Take 1.5 tablets (22.5 mg total) by mouth every 4 (four) hours as needed for severe pain. 12/17/13  Yes [name], MD  omeprazole (PRILOSEC) 40 MG capsule Take 40 mg by mouth every evening.   Yes Historical Provider, MD  polyethylene glycol (MIRALAX / GLYCOLAX) packet Take 17 g by mouth 3  (three) times a week.   Yes Historical Provider, MD  senna (SENOKOT) 8.6 MG TABS tablet Take 2 tablets by mouth daily as needed for mild constipation.   Yes Historical Provider, MD  sertraline (ZOLOFT) 50 MG tablet Take 50 mg by mouth every evening.   Yes Historical Provider, MD  sertraline (ZOLOFT) 50 MG tablet TAKE 1 TABLET BY MOUTH EVERY DAY 12/02/13  Yes Dorena Dew, FNP  Vitamin D, Ergocalciferol, (DRISDOL) 50000 UNITS CAPS capsule Take 50,000 Units by mouth every Tuesday.   Yes Historical Provider, MD  fluticasone (FLONASE) 50 MCG/ACT nasal spray Place 2 sprays into both nostrils daily as needed for allergies or rhinitis.    Historical Provider, MD  zolpidem (AMBIEN) 5 MG tablet Take 5 mg by mouth at bedtime as needed for sleep.    Historical Provider, MD   Physical Exam: Filed Vitals:   01/04/14 0823 01/04/14 1016  BP: 150/98   Pulse: 66   Temp: 98 F (36.7 C)   TempSrc: Oral   Resp: 20 10  Weight: 223 lb (101.152 kg)   SpO2: 100% 99%   General: Alert, awake, oriented x3, in no acute distress.  HEENT: [place]/AT PEERL, EOMI, anicterc Neck: Trachea midline,  no masses, no thyromegal,y no JVD, no carotid bruit OROPHARYNX:  Moist, No exudate/ erythema/lesions.  Heart: Regular rate and rhythm, without murmurs, rubs, gallops, PMI non-displaced, no heaves or thrills on palpation.  Lungs: Clear to auscultation, no wheezing or rhonchi noted. No increased vocal fremitus resonant to percussion  Abdomen: Soft, nontender, nondistended, positive bowel sounds, no masses no hepatosplenomegaly noted..  Neuro: No focal neurological deficits noted cranial nerves II through XII grossly intact.   Labs on Admission:   Basic Metabolic Panel:  Recent Labs Lab 01/04/14 0850  NA 139  K 3.9  CL 100  CO2 23  GLUCOSE 170*  BUN 17  CREATININE 0.88  CALCIUM 10.1   Liver Function Tests:  Recent Labs Lab 01/04/14 0850  AST 13  ALT 8  ALKPHOS 71  BILITOT 3.0*  PROT 8.9*  ALBUMIN 4.7    CBC:  Recent Labs Lab 01/04/14 0850  WBC 13.3*  NEUTROABS 12.0*  HGB 11.8*  HCT 34.1*  MCV 71.5*  PLT 177   Assessment/Plan: Active Problems: Cassie Weidman with crisis: Pt will be treated with initially treated with weight based rapid re-dosing IVF and Toradol . If pain is above 7/10  then Pt will be transitioned to a weight based Dilaudid PCA . Patient will be re-evaluated for pain in the context of function and relationship to baseline as care progresses. Hypotonic IVF will be given to rehydrate cells.    Time spend: 40 minutes Code Status: Full Code Family Communication: N/A Disposition Plan: Likely home at time of discharge  Cassie Ritter A., MD  Pager [phone]  If 7PM-7AM, please contact night-coverage [email or web address] Password [facility] 01/04/2014, 10:25 AM

## 2014-01-06 ENCOUNTER — Ambulatory Visit: Payer: Medicare Other | Admitting: Internal Medicine

## 2014-01-07 NOTE — Telephone Encounter (Signed)
Information is provided regarding [facility] event. All questions answered; Pt is aware. Cassie Montgomery, Cassie Montgomery

## 2014-01-12 ENCOUNTER — Telehealth: Payer: Self-pay | Admitting: Internal Medicine

## 2014-01-12 DIAGNOSIS — G894 Chronic pain syndrome: Secondary | ICD-10-CM

## 2014-01-12 DIAGNOSIS — D5720 Sickle-cell/Hb-C disease without crisis: Secondary | ICD-10-CM

## 2014-01-12 DIAGNOSIS — Z79891 Long term (current) use of opiate analgesic: Secondary | ICD-10-CM | POA: Insufficient documentation

## 2014-01-12 MED ORDER — METHADONE HCL 5 MG PO TABS: 5.0000 mg | ORAL_TABLET | Freq: Every day | ORAL | Status: DC

## 2014-01-12 NOTE — Telephone Encounter (Signed)
Prescription re-written for methadone 5 mg #30 tabs

## 2014-01-12 NOTE — Telephone Encounter (Signed)
Refill request for Methadone 5mg . LOV 10/07/2013. Please advise. Thanks!

## 2014-01-12 NOTE — Discharge Summary (Signed)
Physician Discharge Summary  Cassie Montgomery CWC:[id number] DOB: [birth year] DOA: 01/04/2014  PCP: MATTHEWS,MICHELLE A., MD  Admit date: 01/04/2014 Discharge date: 01/12/2014  Discharge Diagnoses:  Active Problems: Hb [place] with crisis   Discharge Condition: Stable  Disposition: Home  Diet: Regular   Wt Readings from Last 3 Encounters:  01/04/14 223 lb (101.152 kg)  11/08/13 251 lb 6.4 oz (114.034 kg)  10/07/13 234 lb (106.142 kg)    History of present illness:  Pt known to me with Hb [place] who is well controlled on her usual analgesic regimen and has rare hospitalizations. She presented to day c/o pain in legs and back x 3 days which she described as throbbing in nature and at a level of 8/10. Pt states that this is typical of her pain associated with vaso-occlusive episode. She also had nausea and emesis which caused inability to take her long acting Methadone. She reports emesis is non-bilious and non-hematematous.  She feels that this vaso-occlusive episode was triggered by her recent trip to [place] where she drove to [place] and back. Pt was at the end of her Prescription thus was without her short acting medications and called a request in for her Methadone instead of her Morphine Sulfate. Her Methadone is not due until 01/14/2014, and patient reports that she has sufficient Methadone until due date   Hospital Course:  Pt was be treated with initially treated with weight based rapid re-dosing IVF and Toradol .Pain decreased after her initial treatment and she was given a dose of MSIR. Pain decreased to 5/10 and remained at 5/10 after observation for 2 hours. She is discharged home in stable condition on her pre-hospital  Pain regimen. Pt was ambulatory at the time of discharge without any need for assistance.   Discharge Exam:  Filed Vitals:   01/04/14 1016  BP:   Pulse:   Temp:   Resp: 10   Filed Vitals:   01/04/14 0823 01/04/14 1016  BP: 150/98   Pulse: 66   Temp: 98 F (36.7  C)   TempSrc: Oral   Resp: 20 10  Weight: 223 lb (101.152 kg)   SpO2: 100% 99%     General: Alert, awake, oriented x3, in no acute distress.  Vital Signs: BP 150/98  HR 66  T (Src) 98 F (36.7 C) (Oral)  RR10  Wt 223 lb (101.152 kg)  SpO2 99%  LMP 12/25/2013 HEENT: [place]/AT PEERL, EOMI, anicteric OROPHARYNX:  Moist, No exudate/ erythema/lesions.  Heart: Regular rate and rhythm, without murmurs, rubs, gallops, PMI non-displaced, no heaves or thrills on palpation.  Lungs: Clear to auscultation, no wheezing or rhonchi noted.  Abdomen: Soft, nontender, nondistended, positive bowel sounds, no masses no hepatosplenomegaly noted.  Neuro: No focal neurological deficits noted cranial nerves II through XII grossly intact. DTRs 2+ bilaterally upper and lower extremities. Strength normal in bilateral upper and lower extremities. Musculoskeletal: No warm swelling or erythema around joints, no spinal tenderness noted. Psychiatric: Patient alert and oriented x3, good insight and cognition, good recent to remote recall.    Discharge Instructions  Discharge Instructions   Activity as tolerated - No restrictions    Complete by:  As directed      Diet general    Complete by:  As directed             Medication List         albuterol 108 (90 BASE) MCG/ACT inhaler  Commonly known as:  PROVENTIL HFA;VENTOLIN HFA  Inhale 2 puffs  into the lungs every 6 (six) hours as needed for wheezing or shortness of breath.     fluticasone 50 MCG/ACT nasal spray  Commonly known as:  FLONASE  Place 2 sprays into both nostrils daily as needed for allergies or rhinitis.     folic acid 1 MG tablet  Commonly known as:  FOLVITE  Take 1 mg by mouth every morning.     hydroxyurea 500 MG capsule  Commonly known as:  HYDREA  Take 1,500 mg by mouth every morning. May take with food to minimize GI side effects.     LORazepam 1 MG tablet  Commonly known as:  ATIVAN  Take 1 tablet (1 mg total) by mouth every 8  (eight) hours as needed for anxiety.     morphine 15 MG tablet  Commonly known as:  MSIR  Take 1.5 tablets (22.5 mg total) by mouth every 4 (four) hours as needed for severe pain.     omeprazole 40 MG capsule  Commonly known as:  PRILOSEC  Take 40 mg by mouth every evening.     polyethylene glycol packet  Commonly known as:  MIRALAX / GLYCOLAX  Take 17 g by mouth 3 (three) times a week.     senna 8.6 MG Tabs tablet  Commonly known as:  SENOKOT  Take 2 tablets by mouth daily as needed for mild constipation.     sertraline 50 MG tablet  Commonly known as:  ZOLOFT  TAKE 1 TABLET BY MOUTH EVERY DAY     Vitamin D (Ergocalciferol) 50000 UNITS Caps capsule  Commonly known as:  DRISDOL  Take 50,000 Units by mouth every Tuesday.     zolpidem 5 MG tablet  Commonly known as:  AMBIEN  Take 5 mg by mouth at bedtime as needed for sleep.         The results of significant diagnostics from this hospitalization (including imaging, microbiology, ancillary and laboratory) are listed below for reference.     Time coordinating discharge: 50 minutes  Signed:  MATTHEWS,MICHELLE A.  01/04/2014, 8:43 PM

## 2014-01-14 ENCOUNTER — Telehealth (HOSPITAL_COMMUNITY): Payer: Self-pay | Admitting: Internal Medicine

## 2014-01-14 ENCOUNTER — Telehealth: Payer: Self-pay | Admitting: Internal Medicine

## 2014-01-14 ENCOUNTER — Other Ambulatory Visit: Payer: Medicare Other

## 2014-01-14 DIAGNOSIS — Z0289 Encounter for other administrative examinations: Secondary | ICD-10-CM | POA: Diagnosis not present

## 2014-01-14 DIAGNOSIS — Z79899 Other long term (current) drug therapy: Secondary | ICD-10-CM | POA: Diagnosis not present

## 2014-01-14 DIAGNOSIS — D5720 Sickle-cell/Hb-C disease without crisis: Secondary | ICD-10-CM

## 2014-01-14 DIAGNOSIS — Z79891 Long term (current) use of opiate analgesic: Secondary | ICD-10-CM

## 2014-01-14 MED ORDER — MORPHINE SULFATE 15 MG PO TABS: 22.5000 mg | ORAL_TABLET | ORAL | Status: DC | PRN

## 2014-01-14 NOTE — Telephone Encounter (Signed)
Refill request for MSIR 15mg . LOV 10/07/2013. Please advise. Thanks!

## 2014-01-14 NOTE — Telephone Encounter (Signed)
Notified pt that, per MD, she should go to ED for evaluation; pt verbalizes understanding

## 2014-01-14 NOTE — Telephone Encounter (Signed)
Prescription reordered for MSIR 15 mg #135 tabs. (15 day supply)   Quinn Bartling A.

## 2014-01-14 NOTE — Telephone Encounter (Signed)
Pt states has nausea and vomiting x 3; states having pain in abdomen, arms, and legs, rates pain 10/10; pt states took home pain medications but has been vomiting, so she hasn't felt any relief; informed pt that I would notify MD and give her a call back with further instructions

## 2014-01-18 LAB — BENZODIAZEPINES (GC/LC/MS), URINE
ALPRAZOLAMU: NEGATIVE ng/mL (ref <25)
Clonazepam metabolite (GC/LC/MS), ur confirm: NEGATIVE ng/mL (ref <25)
Flurazepam metabolite (GC/LC/MS), ur confirm: NEGATIVE ng/mL (ref <50)
LORAZEPAMU: 2846 ng/mL — AB (ref <50)
Midazolam (GC/LC/MS), ur confirm: NEGATIVE ng/mL (ref <50)
Nordiazepam (GC/LC/MS), ur confirm: NEGATIVE ng/mL (ref <50)
Oxazepam (GC/LC/MS), ur confirm: NEGATIVE ng/mL (ref <50)
TEMAZEPAMU: NEGATIVE ng/mL (ref <50)
Triazolam metabolite (GC/LC/MS), ur confirm: NEGATIVE ng/mL (ref <50)

## 2014-01-18 LAB — OPIATES/OPIOIDS (LC/MS-MS)
CODEINE URINE: NEGATIVE ng/mL (ref <50)
HYDROMORPHONE: 116 ng/mL — AB (ref <50)
Hydrocodone: NEGATIVE ng/mL (ref <50)
MORPHINE: NEGATIVE ng/mL (ref <50)
Norhydrocodone, Ur: NEGATIVE ng/mL (ref <50)
Noroxycodone, Ur: NEGATIVE ng/mL (ref <50)
OXYMORPHONE, URINE: NEGATIVE ng/mL (ref <50)
Oxycodone, ur: NEGATIVE ng/mL (ref <50)

## 2014-01-18 LAB — AMPHETAMINES (GC/LC/MS), URINE
Amphetamine GC/MS Conf: 6114 ng/mL — AB (ref <250)
Methamphetamine Quant, Ur: NEGATIVE ng/mL (ref <250)

## 2014-01-19 ENCOUNTER — Ambulatory Visit (INDEPENDENT_AMBULATORY_CARE_PROVIDER_SITE_OTHER): Payer: Medicare Other | Admitting: Family Medicine

## 2014-01-19 ENCOUNTER — Ambulatory Visit (HOSPITAL_COMMUNITY)
Admission: RE | Admit: 2014-01-19 | Discharge: 2014-01-19 | Disposition: A | Discharge status: Home / Self Care | Payer: Medicare Other | Source: Ambulatory Visit | Attending: Family Medicine | Admitting: Family Medicine

## 2014-01-19 VITALS — BP 122/81 | HR 89 | Temp 98.1°F | Resp 18 | Ht 66.0 in | Wt 235.0 lb

## 2014-01-19 DIAGNOSIS — M25529 Pain in unspecified elbow: Secondary | ICD-10-CM

## 2014-01-19 DIAGNOSIS — M25521 Pain in right elbow: Secondary | ICD-10-CM

## 2014-01-19 DIAGNOSIS — Z23 Encounter for immunization: Secondary | ICD-10-CM

## 2014-01-19 DIAGNOSIS — F325 Major depressive disorder, single episode, in full remission: Secondary | ICD-10-CM

## 2014-01-19 DIAGNOSIS — N926 Irregular menstruation, unspecified: Secondary | ICD-10-CM

## 2014-01-19 DIAGNOSIS — R110 Nausea: Secondary | ICD-10-CM | POA: Diagnosis not present

## 2014-01-19 DIAGNOSIS — G4700 Insomnia, unspecified: Secondary | ICD-10-CM

## 2014-01-19 DIAGNOSIS — D5720 Sickle-cell/Hb-C disease without crisis: Secondary | ICD-10-CM

## 2014-01-19 LAB — PRESCRIPTION MONITORING PROFILE (SOLSTAS)
Barbiturate Screen, Urine: NEGATIVE ng/mL
Buprenorphine, Urine: NEGATIVE ng/mL
CANNABINOID SCRN UR: NEGATIVE ng/mL
CARISOPRODOL, URINE: NEGATIVE ng/mL
COCAINE METABOLITES: NEGATIVE ng/mL
CREATININE, URINE: 171.14 mg/dL (ref >20.0)
Fentanyl, Ur: NEGATIVE ng/mL
MDMA URINE: NEGATIVE ng/mL
MEPERIDINE UR: NEGATIVE ng/mL
METHADONE SCREEN, URINE: NEGATIVE ng/mL
Nitrites, Initial: NEGATIVE ug/mL
OXYCODONE SCRN UR: NEGATIVE ng/mL
Propoxyphene: NEGATIVE ng/mL
Tapentadol, urine: NEGATIVE ng/mL
Tramadol Scrn, Ur: NEGATIVE ng/mL
Zolpidem, Urine: NEGATIVE ng/mL
pH, Initial: 5.9 pH (ref 4.5–8.9)

## 2014-01-19 MED ORDER — ONDANSETRON HCL 4 MG PO TABS: 4.0000 mg | ORAL_TABLET | Freq: Three times a day (TID) | ORAL | Status: DC | PRN

## 2014-01-19 NOTE — Progress Notes (Signed)
Subjective:    Patient ID: Cassie Montgomery, female    DOB: 08-09-1970, 43 y.o.   MRN: [id number]  HPI   Patient presents for 3 month follow up of sickle cell disease. She states that pain is primarily in right elbow, which is different from sickle cell pain. She reports that her current pain intensity is 4-5/10 described as constant and aching. She reports that right elbow pain has been increasing in intensity and worsens with activity.  States that her pain is improved by pain medications last taken this am.   She is also complaining about previous menstrual period. She states that menses consisted of cramping and moderate bleeding. Also, her previous menstrual cycle was accompanied by episodes of nausea and vomiting. She states that cycle ended 2 days ago and symptoms have improved.   Review of Systems  Constitutional: Negative.   HENT: Negative.   Eyes: Negative.   Respiratory: Negative.   Cardiovascular: Negative.   Gastrointestinal: Negative.   Genitourinary: Positive for flank pain and menstrual problem (History of heavy menstrual period). Negative for vaginal bleeding and vaginal discharge.  Musculoskeletal: Positive for myalgias (right elbow pain). Negative for joint swelling.  Skin: Negative.   Neurological: Negative.   Psychiatric/Behavioral: Negative.  Negative for suicidal ideas.       Objective:   Physical Exam  Constitutional: She is oriented to person, place, and time. She appears well-developed and well-nourished.  HENT:  Head: Normocephalic and atraumatic.  Right Ear: External ear normal.  Mouth/Throat: Oropharynx is clear and moist.  Eyes: Conjunctivae and EOM are normal. Pupils are equal, round, and reactive to light.  Neck: Normal range of motion. Neck supple.  Cardiovascular: Normal rate, regular rhythm and normal heart sounds.   Pulmonary/Chest: Effort normal and breath sounds normal.  Abdominal: Soft. Bowel sounds are normal.  Musculoskeletal:   Right forearm: She exhibits tenderness and bony tenderness. She exhibits no swelling and no edema.  Neurological: She is alert and oriented to person, place, and time. She has normal reflexes.  Skin: Skin is warm, dry and intact. There is erythema.     Psychiatric: She has a normal mood and affect. Her behavior is normal. Judgment and thought content normal.     BP 122/81  Pulse 89  Temp(Src) 98.1 F (36.7 C) (Oral)  Resp 18  Ht 5\' 6"  (1.676 m)  Wt 235 lb (106.595 kg)  BMI 37.95 kg/m2  LMP 12/25/2013     Assessment & Plan:  1. Sickle cell disease, type [place], without crisis Patient reports that she has not been taking Hydrea consistently. Discussed the importance of Hydrea. Also discussed potential  side effects. She expressed understanding. She is taking folic acid consistently. Will check CBC and CMP.   2. Right elbow pain  - DG Elbow Complete Right; Future  3. Nausea alone  - ondansetron (ZOFRAN) 4 MG tablet; Take 1 tablet (4 mg total) by mouth every 8 (eight) hours as needed for nausea or vomiting.  Dispense: 20 tablet; Refill: 0  4. Irregular menstrual cycle She states that last menstrual period was heavy with clots accompanied by nausea and vomiting. She states that cycle lasted for 7 days. Recommend that she maintain a menstrual diary and bring to follow up appointment.     5. Need for prophylactic vaccination and inoculation against influenza  - Flu Vaccine QUAD 36+ mos PF IM (Fluarix Quad PF)  6. Insomnia -Ms. Schomer stated that she discontinued Ambien greater than 1 month ago. She reports  that sleep has improved on Ativan.   7. Major depressive disorder, single episode, in full remission Patient is stable on current medication regimen   Meds ordered this encounter  Medications  . ondansetron (ZOFRAN) 4 MG tablet    Sig: Take 1 tablet (4 mg total) by mouth every 8 (eight) hours as needed for nausea or vomiting.    Dispense:  20 tablet    Refill:  0    Order  Specific Question:  Supervising Provider    Answer:  Liston Alba A [[id number]]

## 2014-01-24 ENCOUNTER — Ambulatory Visit: Payer: Medicare Other | Admitting: Internal Medicine

## 2014-01-24 ENCOUNTER — Encounter: Payer: Self-pay | Admitting: Family Medicine

## 2014-01-31 ENCOUNTER — Telehealth: Payer: Self-pay | Admitting: Internal Medicine

## 2014-01-31 DIAGNOSIS — D5720 Sickle-cell/Hb-C disease without crisis: Secondary | ICD-10-CM

## 2014-01-31 MED ORDER — MORPHINE SULFATE 15 MG PO TABS: 22.5000 mg | ORAL_TABLET | ORAL | Status: DC | PRN

## 2014-01-31 NOTE — Telephone Encounter (Signed)
Refill request for MSIR 15mg . LOV 01/19/2014. Please advise. Thanks!

## 2014-01-31 NOTE — Telephone Encounter (Signed)
Meds ordered this encounter  Medications  . morphine (MSIR) 15 MG tablet    Sig: Take 1.5 tablets (22.5 mg total) by mouth every 4 (four) hours as needed for severe pain.    Dispense:  135 tablet    Refill:  0    Do not refill before 02/02/2014    Order Specific Question:  Supervising Provider    Answer:  Liston Alba A [[id number]]   Dorena Dew, FNP  Reviewed [place] Substance Reporting system prior to reorder

## 2014-02-09 ENCOUNTER — Telehealth: Payer: Self-pay | Admitting: Internal Medicine

## 2014-02-09 DIAGNOSIS — D5720 Sickle-cell/Hb-C disease without crisis: Secondary | ICD-10-CM

## 2014-02-09 DIAGNOSIS — G894 Chronic pain syndrome: Secondary | ICD-10-CM

## 2014-02-09 NOTE — Telephone Encounter (Signed)
Refill request for methadone 5mg . LOV 01/19/2014. Thanks!

## 2014-02-10 MED ORDER — METHADONE HCL 5 MG PO TABS: 5.0000 mg | ORAL_TABLET | Freq: Every day | ORAL | Status: DC

## 2014-02-10 NOTE — Telephone Encounter (Signed)
Meds ordered this encounter  Medications  . methadone (DOLOPHINE) 5 MG tablet    Sig: Take 1 tablet (5 mg total) by mouth at bedtime.    Dispense:  30 tablet    Refill:  0    Not to be filled prior to 02/13/2014    Order Specific Question:  Supervising Provider    Answer:  Liston Alba A [[id number]]  Dorena Dew, FNP Reviewed [place] Substance Reporting system prior to reorder

## 2014-02-11 ENCOUNTER — Telehealth: Payer: Self-pay | Admitting: Internal Medicine

## 2014-02-11 DIAGNOSIS — D5720 Sickle-cell/Hb-C disease without crisis: Secondary | ICD-10-CM

## 2014-02-11 NOTE — Telephone Encounter (Signed)
Refill request for Morphine 15mg  LOV 01/19/2014. Please advise. Thanks!

## 2014-02-14 MED ORDER — MORPHINE SULFATE 15 MG PO TABS: 22.5000 mg | ORAL_TABLET | ORAL | Status: DC | PRN

## 2014-02-14 NOTE — Telephone Encounter (Signed)
Meds ordered this encounter  Medications  . morphine (MSIR) 15 MG tablet    Sig: Take 1.5 tablets (22.5 mg total) by mouth every 4 (four) hours as needed for severe pain.    Dispense:  135 tablet    Refill:  0    Do not refill before 02/15/2014    Order Specific Question:  Supervising Provider    Answer:  Liston Alba A [[id number]]  Dorena Dew, FNP Reviewed [place] Substance Reporting system prior to reorder

## 2014-02-21 ENCOUNTER — Ambulatory Visit: Payer: Medicare Other | Admitting: Family Medicine

## 2014-02-25 ENCOUNTER — Telehealth: Payer: Self-pay | Admitting: Internal Medicine

## 2014-02-25 DIAGNOSIS — D5720 Sickle-cell/Hb-C disease without crisis: Secondary | ICD-10-CM

## 2014-02-25 NOTE — Telephone Encounter (Signed)
Refill request for MSIR 15mg , LOV 01/19/2014. Please advise. Thanks!

## 2014-02-28 MED ORDER — MORPHINE SULFATE 15 MG PO TABS: 22.5000 mg | ORAL_TABLET | ORAL | Status: DC | PRN

## 2014-02-28 NOTE — Telephone Encounter (Signed)
Meds ordered this encounter  Medications  . morphine (MSIR) 15 MG tablet    Sig: Take 1.5 tablets (22.5 mg total) by mouth every 4 (four) hours as needed for severe pain.    Dispense:  135 tablet    Refill:  0    Do not refill before 03/01/2014    Order Specific Question:  Supervising Provider    Answer:  Liston Alba A [[id number]]  Reviewed [place] Substance Reporting system prior to reorder Dorena Dew, FNP

## 2014-03-11 ENCOUNTER — Telehealth: Payer: Self-pay | Admitting: Internal Medicine

## 2014-03-11 DIAGNOSIS — D5720 Sickle-cell/Hb-C disease without crisis: Secondary | ICD-10-CM

## 2014-03-11 DIAGNOSIS — G894 Chronic pain syndrome: Secondary | ICD-10-CM

## 2014-03-14 MED ORDER — METHADONE HCL 5 MG PO TABS: 5.0000 mg | ORAL_TABLET | Freq: Every day | ORAL | Status: DC

## 2014-03-14 MED ORDER — MORPHINE SULFATE 15 MG PO TABS: 22.5000 mg | ORAL_TABLET | ORAL | Status: DC | PRN

## 2014-03-14 NOTE — Telephone Encounter (Signed)
Refill request for MSIR 15mg , Methadone 5mg , and Ativan 1mg . LOV: 01/19/2014. Please advise. Thanks!

## 2014-03-14 NOTE — Telephone Encounter (Signed)
Meds ordered this encounter  Medications  . morphine (MSIR) 15 MG tablet    Sig: Take 1.5 tablets (22.5 mg total) by mouth every 4 (four) hours as needed for severe pain.    Dispense:  135 tablet    Refill:  0    Do not refill before 03/01/2014  . methadone (DOLOPHINE) 5 MG tablet    Sig: Take 1 tablet (5 mg total) by mouth at bedtime.    Dispense:  30 tablet    Refill:  0    Not to be filled prior to 02/13/2014  Reviewed [place] Substance Reporting system prior to reorder Dorena Dew, FNP

## 2014-03-16 ENCOUNTER — Telehealth: Payer: Self-pay

## 2014-03-16 DIAGNOSIS — F411 Generalized anxiety disorder: Secondary | ICD-10-CM

## 2014-03-16 MED ORDER — LORAZEPAM 1 MG PO TABS: 1.0000 mg | ORAL_TABLET | Freq: Three times a day (TID) | ORAL | Status: DC | PRN

## 2014-03-16 NOTE — Telephone Encounter (Signed)
Refill request for ativan. LOV 01/19/2014. Please advise. Thanks!

## 2014-03-16 NOTE — Telephone Encounter (Signed)
Meds ordered this encounter  Medications  . LORazepam (ATIVAN) 1 MG tablet    Sig: Take 1 tablet (1 mg total) by mouth every 8 (eight) hours as needed for anxiety.    Dispense:  60 tablet    Refill:  0    Order Specific Question:  Supervising Provider    Answer:  Liston Alba A [[id number]]  Reviewed [place] Substance Reporting system prior to reorder Dorena Dew, FNP

## 2014-03-28 ENCOUNTER — Telehealth: Payer: Self-pay | Admitting: Internal Medicine

## 2014-03-28 DIAGNOSIS — D5720 Sickle-cell/Hb-C disease without crisis: Secondary | ICD-10-CM

## 2014-03-28 MED ORDER — MORPHINE SULFATE 15 MG PO TABS: 22.5000 mg | ORAL_TABLET | ORAL | Status: DC | PRN

## 2014-03-28 NOTE — Telephone Encounter (Signed)
Refill request for MSIR 15mg . LOV9/01/2014. Please advise. Thanks!

## 2014-03-28 NOTE — Telephone Encounter (Signed)
Meds ordered this encounter  Medications  . morphine (MSIR) 15 MG tablet    Sig: Take 1.5 tablets (22.5 mg total) by mouth every 4 (four) hours as needed for severe pain.    Dispense:  135 tablet    Refill:  0    Order Specific Question:  Supervising Provider    Answer:  MATTHEWS, MICHELLE A [[id number]]  Reviewed [place] Substance Reporting system prior to reorder   Lashena Signer M, FNP 

## 2014-04-04 ENCOUNTER — Ambulatory Visit: Payer: Medicare Other | Admitting: Internal Medicine

## 2014-04-05 ENCOUNTER — Other Ambulatory Visit: Payer: Self-pay | Admitting: Family Medicine

## 2014-04-06 ENCOUNTER — Other Ambulatory Visit: Payer: Self-pay | Admitting: Family Medicine

## 2014-04-06 ENCOUNTER — Telehealth: Payer: Self-pay | Admitting: Internal Medicine

## 2014-04-06 ENCOUNTER — Other Ambulatory Visit: Payer: Self-pay | Admitting: Internal Medicine

## 2014-04-06 DIAGNOSIS — F410 Panic disorder [episodic paroxysmal anxiety] without agoraphobia: Secondary | ICD-10-CM

## 2014-04-06 MED ORDER — LORAZEPAM 1 MG PO TABS: 1.0000 mg | ORAL_TABLET | Freq: Three times a day (TID) | ORAL | Status: DC | PRN

## 2014-04-06 NOTE — Telephone Encounter (Signed)
  Prescription re-written for Ativan 1 mg #60 tabs with 5 refills

## 2014-04-06 NOTE — Telephone Encounter (Signed)
Patient requesting a refill on ativan. LOV 01/19/2014 with [place]. Please advise.   **Patient states the last rx she received was only 20 day supply and she does NOT have enough to get thru the weekend. Please advise. Thanks!

## 2014-04-06 NOTE — Telephone Encounter (Signed)
Called patient to advise RX has been faxed to pharmacy. Phone number invalid/disconnected.

## 2014-04-06 NOTE — Telephone Encounter (Signed)
Prescription re-written for Ativan 1 mg #90 tabs and 5 refills. Earlier prescription for 60 tabs discarded.

## 2014-04-06 NOTE — Telephone Encounter (Signed)
Refill request

## 2014-04-11 ENCOUNTER — Telehealth: Payer: Self-pay | Admitting: Internal Medicine

## 2014-04-11 DIAGNOSIS — D5720 Sickle-cell/Hb-C disease without crisis: Secondary | ICD-10-CM

## 2014-04-11 MED ORDER — MORPHINE SULFATE 15 MG PO TABS: 22.5000 mg | ORAL_TABLET | ORAL | Status: DC | PRN

## 2014-04-11 NOTE — Telephone Encounter (Signed)
Prescription re-written for MSIR 15 mg #135 tabs after [facility] reviewed. No unusual activity detected.

## 2014-04-11 NOTE — Telephone Encounter (Signed)
Refill request for MSIR 15 mg. Patient last saw [place] on 01/19/2014. Please advise. Thanks!

## 2014-04-14 ENCOUNTER — Telehealth (HOSPITAL_COMMUNITY): Payer: Self-pay | Admitting: Internal Medicine

## 2014-04-14 ENCOUNTER — Encounter (HOSPITAL_COMMUNITY): Payer: Self-pay

## 2014-04-14 ENCOUNTER — Emergency Department (HOSPITAL_COMMUNITY): Payer: Medicare Other

## 2014-04-14 ENCOUNTER — Emergency Department (HOSPITAL_COMMUNITY)
Admission: EM | Admit: 2014-04-14 | Discharge: 2014-04-15 | Disposition: A | Discharge status: Home / Self Care | Payer: Medicare Other | Attending: Emergency Medicine | Admitting: Emergency Medicine

## 2014-04-14 DIAGNOSIS — Z79899 Other long term (current) drug therapy: Secondary | ICD-10-CM | POA: Insufficient documentation

## 2014-04-14 DIAGNOSIS — F329 Major depressive disorder, single episode, unspecified: Secondary | ICD-10-CM | POA: Insufficient documentation

## 2014-04-14 DIAGNOSIS — K219 Gastro-esophageal reflux disease without esophagitis: Secondary | ICD-10-CM | POA: Diagnosis not present

## 2014-04-14 DIAGNOSIS — Z7951 Long term (current) use of inhaled steroids: Secondary | ICD-10-CM | POA: Diagnosis not present

## 2014-04-14 DIAGNOSIS — J45909 Unspecified asthma, uncomplicated: Secondary | ICD-10-CM | POA: Insufficient documentation

## 2014-04-14 DIAGNOSIS — R079 Chest pain, unspecified: Secondary | ICD-10-CM | POA: Diagnosis not present

## 2014-04-14 DIAGNOSIS — Z8742 Personal history of other diseases of the female genital tract: Secondary | ICD-10-CM | POA: Diagnosis not present

## 2014-04-14 DIAGNOSIS — Z8679 Personal history of other diseases of the circulatory system: Secondary | ICD-10-CM | POA: Insufficient documentation

## 2014-04-14 DIAGNOSIS — D5700 Hb-SS disease with crisis, unspecified: Secondary | ICD-10-CM

## 2014-04-14 LAB — CBC WITH DIFFERENTIAL/PLATELET
Basophils Absolute: 0 10*3/uL (ref 0.0–0.1)
Basophils Relative: 0 % (ref 0–1)
EOS ABS: 0.3 10*3/uL (ref 0.0–0.7)
Eosinophils Relative: 4 % (ref 0–5)
HCT: 26.3 % — ABNORMAL LOW (ref 36.0–46.0)
HEMOGLOBIN: 9.1 g/dL — AB (ref 12.0–15.0)
LYMPHS ABS: 1.6 10*3/uL (ref 0.7–4.0)
Lymphocytes Relative: 22 % (ref 12–46)
MCH: 24.9 pg — AB (ref 26.0–34.0)
MCHC: 34.6 g/dL (ref 30.0–36.0)
MCV: 71.9 fL — ABNORMAL LOW (ref 78.0–100.0)
MONOS PCT: 5 % (ref 3–12)
Monocytes Absolute: 0.4 10*3/uL (ref 0.1–1.0)
Neutro Abs: 5.2 10*3/uL (ref 1.7–7.7)
Neutrophils Relative %: 69 % (ref 43–77)
Platelets: 131 10*3/uL — ABNORMAL LOW (ref 150–400)
RBC: 3.66 MIL/uL — ABNORMAL LOW (ref 3.87–5.11)
RDW: 16.8 % — ABNORMAL HIGH (ref 11.5–15.5)
WBC: 7.5 10*3/uL (ref 4.0–10.5)

## 2014-04-14 LAB — COMPREHENSIVE METABOLIC PANEL
ALK PHOS: 60 U/L (ref 39–117)
ALT: 8 U/L (ref 0–35)
ANION GAP: 10 (ref 5–15)
AST: 14 U/L (ref 0–37)
Albumin: 3.9 g/dL (ref 3.5–5.2)
BILIRUBIN TOTAL: 2.5 mg/dL — AB (ref 0.3–1.2)
BUN: 12 mg/dL (ref 6–23)
CO2: 25 mEq/L (ref 19–32)
Calcium: 9.3 mg/dL (ref 8.4–10.5)
Chloride: 103 mEq/L (ref 96–112)
Creatinine, Ser: 0.83 mg/dL (ref 0.50–1.10)
GFR, EST NON AFRICAN AMERICAN: 85 mL/min — AB (ref >90)
GLUCOSE: 101 mg/dL — AB (ref 70–99)
Potassium: 4.1 mEq/L (ref 3.7–5.3)
Sodium: 138 mEq/L (ref 137–147)
Total Protein: 7.6 g/dL (ref 6.0–8.3)

## 2014-04-14 LAB — RETICULOCYTES
RBC.: 3.66 MIL/uL — ABNORMAL LOW (ref 3.87–5.11)
Retic Count, Absolute: 150.1 10*3/uL (ref 19.0–186.0)
Retic Ct Pct: 4.1 % — ABNORMAL HIGH (ref 0.4–3.1)

## 2014-04-14 MED ORDER — DIPHENHYDRAMINE HCL 50 MG/ML IJ SOLN
25.0000 mg | INTRAMUSCULAR | Status: AC | PRN
  Administered 2014-04-14 – 2014-04-15 (×3): 25 mg via INTRAVENOUS

## 2014-04-14 MED ORDER — ONDANSETRON HCL 4 MG/2ML IJ SOLN: 4.0000 mg | Freq: Four times a day (QID) | INTRAMUSCULAR | Status: DC | PRN

## 2014-04-14 MED ORDER — HYDROMORPHONE HCL 2 MG/ML IJ SOLN: 2.0000 mg | Freq: Once | INTRAMUSCULAR | Status: DC

## 2014-04-14 MED ORDER — KETOROLAC TROMETHAMINE 30 MG/ML IJ SOLN
30.0000 mg | Freq: Once | INTRAMUSCULAR | Status: AC
  Administered 2014-04-14: 30 mg via INTRAVENOUS

## 2014-04-14 MED ORDER — HYDROMORPHONE HCL 2 MG/ML IJ SOLN
2.0000 mg | INTRAMUSCULAR | Status: AC | PRN
  Administered 2014-04-14 – 2014-04-15 (×3): 2 mg via INTRAVENOUS

## 2014-04-14 NOTE — ED Notes (Signed)
Patient ambulatory to treatment room

## 2014-04-14 NOTE — Telephone Encounter (Signed)
Pt states having pain in arms, side, and legs; states has not experienced relief with home pain medications; rates pain 8/10; denies vomiting, diarrhea, SOB, or chest pain; states has experienced nausea; states feels really tired; notified patient that MD would be notified and she would receive a return call

## 2014-04-14 NOTE — ED Notes (Signed)
Pt presents with c/o sickle cell pain in her arms, legs, and feet. Pt reports she attempted to treat the pain at home but was unable to get any relief. Pt reports this is what her pain normally feels like when she is in crisis. Ambulatory to triage.

## 2014-04-14 NOTE — Telephone Encounter (Signed)
Notified patient that, per MD, she may go to the emergency department for evaluation; pt verbalizes understanding

## 2014-04-14 NOTE — ED Notes (Signed)
Patient c/o generalized joint pain. Patient states her pain is in her legs, foot, thighs, and arms. Patient states her pain started @ 8 hours ago. Patient states she called the [facility] at 1500 and she was referred to the ER. Patient states she did not come at that time as she was "trying to handle it at home". Patient alert & oriented, drinking soda when nurse entered room.

## 2014-04-15 DIAGNOSIS — D5700 Hb-SS disease with crisis, unspecified: Secondary | ICD-10-CM | POA: Diagnosis not present

## 2014-04-15 MED ORDER — HYDROMORPHONE HCL 2 MG/ML IJ SOLN
2.0000 mg | Freq: Once | INTRAMUSCULAR | Status: AC
  Administered 2014-04-15: 2 mg via INTRAVENOUS

## 2014-04-15 MED ORDER — HYDROMORPHONE HCL 2 MG/ML IJ SOLN
2.0000 mg | Freq: Once | INTRAMUSCULAR | Status: AC
Start: 2014-04-15 — End: 2014-04-15
  Administered 2014-04-15: 2 mg via INTRAVENOUS

## 2014-04-15 NOTE — ED Notes (Signed)
Patient given sandwich and drink, ok by MD. 

## 2014-04-15 NOTE — ED Notes (Signed)
Patient states she has been in the bathroom while I have been looking for her. Patient c/o a "very large hemorrhoid" and is requesting something for the discomfort. Advised patient I would notify Dr Venora Maples of her request. Dr Venora Maples was updated on the delay in medication administration and patient new request. No new orders at this time. Per Dr Venora Maples request patient was notified this will be her last dose of pain medication in the ER, if this does not resolve her pain she will have to be admitted for pain control. Patient states she does not wish to be admitted.

## 2014-04-15 NOTE — ED Notes (Signed)
Patient laying in bed, resting quietly, texting on her phone. Patient denies any improvement in her pain.

## 2014-04-15 NOTE — ED Notes (Signed)
Patient ambulated to restroom unassisted.

## 2014-04-15 NOTE — ED Provider Notes (Signed)
CSN: [id number]     Arrival date & time 04/14/14  2201 History   First MD Initiated Contact with Patient 04/14/14 2314     Chief Complaint  Patient presents with  . Sickle Cell Pain Crisis    HPI Patient presents to the emergency department  complaining of diffuse pain all over consistent with her typical sickle cell crisis pain.  She's tried her home pain medications without relief and presents requesting pain management.  She denies shortness of breath.  No fevers or chills.  Denies abdominal pain.  No recent illness.  Denies urinary complaints.  No nausea vomiting or diarrhea.  Her pain is moderate to severe in severity at this time.   Past Medical History  Diagnosis Date  . PMDD (premenstrual dysphoric disorder)   . Sickle cell anemia   . Sickle cell disease without crisis 07/15/2011  . Migraine with aura 07/15/2011  . GERD (gastroesophageal reflux disease)   . Asthma in adult     probable  . Depression    Past Surgical History  Procedure Laterality Date  . Cesarean section      2   Family History  Problem Relation Age of Onset  . Hypertension Mother   . Asthma Son   . Asthma Son   . Asthma Daughter    History  Substance Use Topics  . Smoking status: Never Smoker   . Smokeless tobacco: Not on file  . Alcohol Use: No   OB History    No data available     Review of Systems  All other systems reviewed and are negative.     Allergies  Sulfa antibiotics  Home Medications   Prior to Admission medications   Medication Sig Start Date End Date Taking? Authorizing Provider  albuterol (PROVENTIL HFA;VENTOLIN HFA) 108 (90 BASE) MCG/ACT inhaler Inhale 2 puffs into the lungs every 6 (six) hours as needed for wheezing or shortness of breath (wheezing).    Yes Historical Provider, MD  fluticasone (FLONASE) 50 MCG/ACT nasal spray Place 2 sprays into both nostrils daily as needed for allergies or rhinitis (allergies).    Yes Historical Provider, MD  folic acid (FOLVITE) 1 MG  tablet Take 1 mg by mouth every morning.   Yes Historical Provider, MD  LORazepam (ATIVAN) 1 MG tablet Take 1 tablet (1 mg total) by mouth every 8 (eight) hours as needed. for anxiety 04/06/14  Yes [name], MD  methadone (DOLOPHINE) 5 MG tablet Take 1 tablet (5 mg total) by mouth at bedtime. 03/14/14  Yes Dorena Dew, FNP  morphine (MSIR) 15 MG tablet Take 1.5 tablets (22.5 mg total) by mouth every 4 (four) hours as needed for severe pain. 04/11/14  Yes [name], MD  polyethylene glycol (MIRALAX / GLYCOLAX) packet Take 17 g by mouth 3 (three) times a week.   Yes Historical Provider, MD  omeprazole (PRILOSEC) 40 MG capsule Take 40 mg by mouth every evening.    Historical Provider, MD  ondansetron (ZOFRAN) 4 MG tablet Take 1 tablet (4 mg total) by mouth every 8 (eight) hours as needed for nausea or vomiting. 01/19/14   Dorena Dew, FNP  senna (SENOKOT) 8.6 MG TABS tablet Take 2 tablets by mouth daily as needed for mild constipation.    Historical Provider, MD  sertraline (ZOLOFT) 50 MG tablet TAKE 1 TABLET BY MOUTH EVERY DAY Patient not taking: Reported on 04/14/2014 12/02/13   Dorena Dew, FNP   BP 126/84 mmHg  Pulse 93  Temp(Src) 97.9 F (36.6 C) (Oral)  Resp 16  SpO2 99%  LMP 03/30/2014 Physical Exam  Constitutional: She is oriented to person, place, and time. She appears well-developed and well-nourished. No distress.  HENT:  Head: Normocephalic and atraumatic.  Eyes: EOM are normal.  Neck: Normal range of motion.  Cardiovascular: Normal rate, regular rhythm and normal heart sounds.   Pulmonary/Chest: Effort normal and breath sounds normal.  Abdominal: Soft. She exhibits no distension. There is no tenderness.  Musculoskeletal: Normal range of motion.  Neurological: She is alert and oriented to person, place, and time.  Skin: Skin is warm and dry.  Psychiatric: She has a normal mood and affect. Judgment normal.  Nursing note and vitals  reviewed.   ED Course  Procedures (including critical care time) Labs Review Labs Reviewed  CBC WITH DIFFERENTIAL - Abnormal; Notable for the following:    RBC 3.66 (*)    Hemoglobin 9.1 (*)    HCT 26.3 (*)    MCV 71.9 (*)    MCH 24.9 (*)    RDW 16.8 (*)    Platelets 131 (*)    All other components within normal limits  COMPREHENSIVE METABOLIC PANEL - Abnormal; Notable for the following:    Glucose, Bld 101 (*)    Total Bilirubin 2.5 (*)    GFR calc non Af Amer 85 (*)    All other components within normal limits  RETICULOCYTES - Abnormal; Notable for the following:    Retic Ct Pct 4.1 (*)    RBC. 3.66 (*)    All other components within normal limits  POC URINE PREG, ED    Imaging Review Dg Chest 2 View  04/14/2014   CLINICAL DATA:  Acute chest pain. History of sickle cell disease. Initial encounter.  EXAM: CHEST  2 VIEW  COMPARISON:  11/08/2013  FINDINGS: The cardiomediastinal silhouette is unremarkable.  There is no evidence of focal airspace disease, pulmonary edema, suspicious pulmonary nodule/mass, pleural effusion, or pneumothorax. No acute bony abnormalities are identified. Next more on  IMPRESSION: No active cardiopulmonary disease.   Electronically Signed   By: Hassan Rowan M.D.   On: 04/14/2014 23:28  I personally reviewed the imaging tests through PACS system I reviewed available ER/hospitalization records through the EMR    EKG Interpretation None      MDM   Final diagnoses:  Sickle cell pain crisis    Patient with improvement in her pain in the emergency department after multiple doses of Dilaudid.  She has pain medication at home.  She'll be discharged home at this time to follow-up with her primary care physician.  She understands to return to the ER for new or worsening symptoms.  Vital signs normal.    Hoy Morn, MD 04/15/14 [phone]

## 2014-04-15 NOTE — ED Notes (Signed)
Patient in bed, watching tv, playing games on her phone. Patient alert, NAD noted.

## 2014-04-15 NOTE — ED Notes (Signed)
Patient has not returned to treatment room, patient belongings remain at bedside. Will check back for patient again.

## 2014-04-15 NOTE — ED Notes (Signed)
Patient ok for POs by MD. Patient given water as per her request.

## 2014-04-15 NOTE — ED Notes (Signed)
Patient in NAD, resting quietly, going through her purse when nurse looked through window prior to opening door. Patient began moaning,  states her pain is only slightly improved, rating it 7/10, states her legs are "still REALLY hurting". Dose 3/3 Dilaudid being prepared for admin at this time.

## 2014-04-15 NOTE — ED Notes (Signed)
Patient up, walking around room, has removed monitoring equipment, and has moved the stretcher. Patient states her normal daily pain is 3-5/10. Patient states her current pain is unchanged with the last dose of 2mg  Dilaudid given, but is unable to recall what her pain scale was prior to most recent dilaudid administration. Patient was requested to keep monitoring equipment in place, and to please not move the stretcher again for safety reasons. Patient apologized, states she will not move bed again.

## 2014-04-15 NOTE — ED Notes (Signed)
Entered patient room to reassess pain, patient not in room, belongings remain at bedside. Will check back to re-evaluate patient pain.

## 2014-04-15 NOTE — ED Notes (Signed)
Patient returned from restroom, ambulatory without difficulties.

## 2014-04-15 NOTE — ED Notes (Signed)
Checked patient room again, Patient has not returned to treatment room, patient belongings remain at bedside. Will check back for patient again.

## 2014-04-15 NOTE — ED Notes (Signed)
MD at bedside. 

## 2014-04-15 NOTE — ED Notes (Signed)
MD notified patient is still "in a lot of pain"

## 2014-04-15 NOTE — ED Notes (Signed)
Entered patient room to administer medication, patient not in room at this time. Will check back. Patient belongings remain at bedside at this time

## 2014-04-19 ENCOUNTER — Telehealth: Payer: Self-pay | Admitting: Internal Medicine

## 2014-04-19 DIAGNOSIS — G894 Chronic pain syndrome: Secondary | ICD-10-CM

## 2014-04-19 DIAGNOSIS — D5720 Sickle-cell/Hb-C disease without crisis: Secondary | ICD-10-CM

## 2014-04-20 ENCOUNTER — Telehealth (HOSPITAL_COMMUNITY): Payer: Self-pay | Admitting: Hematology

## 2014-04-20 MED ORDER — METHADONE HCL 5 MG PO TABS: 5.0000 mg | ORAL_TABLET | Freq: Every day | ORAL | Status: DC

## 2014-04-20 NOTE — Telephone Encounter (Signed)
Patient C/O pain to legs bilateral. Patient rates pain 8/10 on pain scale.  Patient states her home medications are not working.  Patient denies N/V/D, chest pain or abdominal pain.  I advised I would notify the physician and give her a call back.

## 2014-04-20 NOTE — Telephone Encounter (Signed)
Refill Request for Methadone 5mg . LOV 01/19/2014 Please advise. Thanks!

## 2014-04-20 NOTE — Telephone Encounter (Signed)
Meds ordered this encounter  Medications  . methadone (DOLOPHINE) 5 MG tablet    Sig: Take 1 tablet (5 mg total) by mouth at bedtime.    Dispense:  30 tablet    Refill:  0    Order Specific Question:  Supervising Provider    Answer:  MATTHEWS, MICHELLE A [[id number]  Reviewed [place] Substance Reporting system prior to reorder   Kenyia Wambolt M, FNP 

## 2014-04-20 NOTE — Telephone Encounter (Signed)
Called patient back and advised I spoke with Dr. Zigmund Daniel.  Due to the time, she would not be able to get effective treatment at the [place] patient that if her pain was bad, she could always go to the ED.  Explained to patient that if she chose to stay home and take her medication, that she could call in the morning at 8a.m. If she is still hurting.  Patient verbalizes understanding.

## 2014-04-22 ENCOUNTER — Telehealth: Payer: Self-pay | Admitting: Internal Medicine

## 2014-04-22 DIAGNOSIS — F410 Panic disorder [episodic paroxysmal anxiety] without agoraphobia: Secondary | ICD-10-CM

## 2014-04-22 DIAGNOSIS — D5720 Sickle-cell/Hb-C disease without crisis: Secondary | ICD-10-CM

## 2014-04-22 NOTE — Telephone Encounter (Signed)
Refill request for lorazepam 1mg  and morphine 15mg . LOV 01/19/2014. Please advise. Thanks!

## 2014-04-26 MED ORDER — MORPHINE SULFATE 15 MG PO TABS: 22.5000 mg | ORAL_TABLET | ORAL | Status: DC | PRN

## 2014-04-26 NOTE — Telephone Encounter (Signed)
Meds ordered this encounter  Medications  . morphine (MSIR) 15 MG tablet    Sig: Take 1.5 tablets (22.5 mg total) by mouth every 4 (four) hours as needed for severe pain.    Dispense:  135 tablet    Refill:  0  Cythina Mickelsen M, FNP   Reviewed [place] Substance Reporting system prior to reorder

## 2014-05-10 ENCOUNTER — Other Ambulatory Visit: Payer: Medicare Other

## 2014-05-10 ENCOUNTER — Telehealth: Payer: Self-pay | Admitting: Internal Medicine

## 2014-05-10 DIAGNOSIS — D5720 Sickle-cell/Hb-C disease without crisis: Secondary | ICD-10-CM

## 2014-05-10 DIAGNOSIS — Z79891 Long term (current) use of opiate analgesic: Secondary | ICD-10-CM

## 2014-05-10 DIAGNOSIS — Z79899 Other long term (current) drug therapy: Secondary | ICD-10-CM | POA: Diagnosis not present

## 2014-05-10 MED ORDER — MORPHINE SULFATE 15 MG PO TABS: 22.5000 mg | ORAL_TABLET | ORAL | Status: DC | PRN

## 2014-05-10 NOTE — Telephone Encounter (Signed)
Ms. Owusu will need to schedule an appointment to discuss morphine equivalence. We will discuss titration of opiate medications during visit.  Meds ordered this encounter  Medications  . morphine (MSIR) 15 MG tablet    Sig: Take 1.5 tablets (22.5 mg total) by mouth every 4 (four) hours as needed for severe pain.    Dispense:  135 tablet    Refill:  0    Order Specific Question:  Supervising Provider    Answer:  Liston Alba A [[id number]  Reviewed [place] Substance Reporting system prior to reorder Dorena Dew, FNP

## 2014-05-10 NOTE — Telephone Encounter (Signed)
Refill request for MSIR 15mg  LOV 01/19/2014. Please advise. Thanks!

## 2014-05-14 LAB — BENZODIAZEPINES (GC/LC/MS), URINE
Alprazolam metabolite (GC/LC/MS), ur confirm: NEGATIVE ng/mL (ref <25)
CLONAZEPAU: NEGATIVE ng/mL (ref <25)
Flurazepam metabolite (GC/LC/MS), ur confirm: NEGATIVE ng/mL (ref <50)
Lorazepam (GC/LC/MS), ur confirm: 2804 ng/mL — AB (ref <50)
Midazolam (GC/LC/MS), ur confirm: NEGATIVE ng/mL (ref <50)
Nordiazepam (GC/LC/MS), ur confirm: NEGATIVE ng/mL (ref <50)
Oxazepam (GC/LC/MS), ur confirm: NEGATIVE ng/mL (ref <50)
Temazepam (GC/LC/MS), ur confirm: NEGATIVE ng/mL (ref <50)
Triazolam metabolite (GC/LC/MS), ur confirm: NEGATIVE ng/mL (ref <50)

## 2014-05-14 LAB — OPIATES/OPIOIDS (LC/MS-MS)
Codeine Urine: NEGATIVE ng/mL (ref <50)
Hydrocodone: NEGATIVE ng/mL (ref <50)
Hydromorphone: NEGATIVE ng/mL (ref <50)
MORPHINE: 5372 ng/mL — AB (ref <50)
NOROXYCODONE, UR: NEGATIVE ng/mL (ref <50)
Norhydrocodone, Ur: NEGATIVE ng/mL (ref <50)
OXYCODONE, UR: NEGATIVE ng/mL (ref <50)
OXYMORPHONE, URINE: NEGATIVE ng/mL (ref <50)

## 2014-05-14 LAB — AMPHETAMINES (GC/LC/MS), URINE
Amphetamine GC/MS Conf: 716 ng/mL — AB (ref <250)
Methamphetamine Quant, Ur: NEGATIVE ng/mL (ref <250)

## 2014-05-16 ENCOUNTER — Ambulatory Visit: Payer: Medicare Other | Admitting: Internal Medicine

## 2014-05-17 LAB — PRESCRIPTION MONITORING PROFILE (SOLSTAS)
BUPRENORPHINE, URINE: NEGATIVE ng/mL
Barbiturate Screen, Urine: NEGATIVE ng/mL
CANNABINOID SCRN UR: NEGATIVE ng/mL
Carisoprodol, Urine: NEGATIVE ng/mL
Cocaine Metabolites: NEGATIVE ng/mL
Creatinine, Urine: 103.54 mg/dL (ref >20.0)
ECSTASY: NEGATIVE ng/mL
FENTANYL URINE: NEGATIVE ng/mL
METHADONE SCREEN, URINE: NEGATIVE ng/mL
Meperidine, Ur: NEGATIVE ng/mL
NITRITES URINE, INITIAL: NEGATIVE ug/mL
Oxycodone Screen, Ur: NEGATIVE ng/mL
PROPOXYPHENE: NEGATIVE ng/mL
Tapentadol, urine: NEGATIVE ng/mL
Tramadol Scrn, Ur: NEGATIVE ng/mL
Zolpidem, Urine: NEGATIVE ng/mL
pH, Initial: 5.3 pH (ref 4.5–8.9)

## 2014-05-18 ENCOUNTER — Telehealth: Payer: Self-pay

## 2014-05-18 DIAGNOSIS — G894 Chronic pain syndrome: Secondary | ICD-10-CM

## 2014-05-18 DIAGNOSIS — D5720 Sickle-cell/Hb-C disease without crisis: Secondary | ICD-10-CM

## 2014-05-18 MED ORDER — METHADONE HCL 5 MG PO TABS: 5.0000 mg | ORAL_TABLET | Freq: Every day | ORAL | Status: DC

## 2014-05-18 NOTE — Telephone Encounter (Signed)
Meds ordered this encounter  Medications  . methadone (DOLOPHINE) 5 MG tablet    Sig: Take 1 tablet (5 mg total) by mouth at bedtime.    Dispense:  30 tablet    Refill:  0    Rx not to be filled prior to 05/21/2014    Order Specific Question:  Supervising Provider    Answer:  Liston Alba A [[id number]]   Ms. Kim informed that she last had an Rx of Ativan prescribed on 04/06/2014, but she did not fill it until 04/26/2014. The Rx was for 30 days, she received 90 tablets TID, prn for increased anxiety. She is to continue medication as prescribed.  Also, she received an Rx of MSIR on 12/29 every 4 hours # 135, which was filled on that day. Rx is not due to be filled until 05/26/2014. Reviewed [place] Substance Reporting system prior to reorder.    Dorena Dew, FNP

## 2014-05-18 NOTE — Telephone Encounter (Signed)
Patient request a refill for Ativan 1mg , Morphine 15mg , and Methadone 5mg . LOV was 9/9/215.   Patient states that the Ativan is not lasting like it use to, she has an upcomming appointment in 1 week and would like to know if she should change how she is taking it (take another tablet) until her appointment on 05/26/2014. Please advise.

## 2014-05-19 ENCOUNTER — Ambulatory Visit: Payer: Medicare Other | Admitting: Internal Medicine

## 2014-05-26 ENCOUNTER — Ambulatory Visit (INDEPENDENT_AMBULATORY_CARE_PROVIDER_SITE_OTHER): Payer: Medicare Other | Admitting: Internal Medicine

## 2014-05-26 VITALS — BP 127/74 | HR 76 | Temp 98.8°F | Resp 16 | Ht 66.0 in | Wt 228.0 lb

## 2014-05-26 DIAGNOSIS — D5720 Sickle-cell/Hb-C disease without crisis: Secondary | ICD-10-CM | POA: Diagnosis not present

## 2014-05-26 DIAGNOSIS — F419 Anxiety disorder, unspecified: Secondary | ICD-10-CM

## 2014-05-26 DIAGNOSIS — F410 Panic disorder [episodic paroxysmal anxiety] without agoraphobia: Secondary | ICD-10-CM | POA: Diagnosis not present

## 2014-05-26 MED ORDER — BUSPIRONE HCL 7.5 MG PO TABS
7.5000 mg | ORAL_TABLET | Freq: Three times a day (TID) | ORAL | Status: DC
Start: 2014-05-26 — End: 2014-06-08

## 2014-05-26 MED ORDER — MORPHINE SULFATE 15 MG PO TABS: 22.5000 mg | ORAL_TABLET | ORAL | Status: DC | PRN

## 2014-05-26 MED ORDER — LORAZEPAM 1 MG PO TABS: 1.0000 mg | ORAL_TABLET | Freq: Three times a day (TID) | ORAL | Status: DC | PRN

## 2014-05-26 MED ORDER — FOLIC ACID 1 MG PO TABS: 1.0000 mg | ORAL_TABLET | Freq: Every morning | ORAL | Status: DC

## 2014-06-06 NOTE — Progress Notes (Signed)
Patient ID: Cassie Montgomery, female   DOB: [birth year], 44 y.o.   MRN: [id number]  CC: "My Ativan is no longer working".  HPI: Pt is here today with complaints of increased anxiety. She had been on Seroquel and Zoloft and stopped taking them so they were discontinued. She has been taking Ativan 1 mg and has essentially been taking it every 8 hours. I have made several recommendations and referrals for psychotherapy in the past. However patient has declined this interventation.  Today she presents requesting that her dose of Ativan be increased. She scores 15 on the GAD scale.  Of note she and her family are receiving family therapy at [facility] in relation to her son's adjustment difficulties. I have discuss with Ms. Tamer that this form of therapy usually focuses on relationships and not individual psycho-evaluation of peripheral subjects. She however reports that there is planned individual psycho-evaluation of her as part of the therapy. She again declines individual therapy for herself.  Allergies  Allergen Reactions  . Sulfa Antibiotics Other (See Comments)    Increased bilirubin   Past Medical History  Diagnosis Date  . PMDD (premenstrual dysphoric disorder)   . Sickle cell anemia   . Sickle cell disease without crisis 07/15/2011  . Migraine with aura 07/15/2011  . GERD (gastroesophageal reflux disease)   . Asthma in adult     probable  . Depression    Current Outpatient Prescriptions on File Prior to Visit  Medication Sig Dispense Refill  . albuterol (PROVENTIL HFA;VENTOLIN HFA) 108 (90 BASE) MCG/ACT inhaler Inhale 2 puffs into the lungs every 6 (six) hours as needed for wheezing or shortness of breath (wheezing).     . fluticasone (FLONASE) 50 MCG/ACT nasal spray Place 2 sprays into both nostrils daily as needed for allergies or rhinitis (allergies).     . methadone (DOLOPHINE) 5 MG tablet Take 1 tablet (5 mg total) by mouth at bedtime. 30 tablet 0  . polyethylene glycol (MIRALAX /  GLYCOLAX) packet Take 17 g by mouth 3 (three) times a week.    Marland Kitchen omeprazole (PRILOSEC) 40 MG capsule Take 40 mg by mouth every evening.    . ondansetron (ZOFRAN) 4 MG tablet Take 1 tablet (4 mg total) by mouth every 8 (eight) hours as needed for nausea or vomiting. (Patient not taking: Reported on 05/26/2014) 20 tablet 0  . senna (SENOKOT) 8.6 MG TABS tablet Take 2 tablets by mouth daily as needed for mild constipation.    . sertraline (ZOLOFT) 50 MG tablet TAKE 1 TABLET BY MOUTH EVERY DAY (Patient not taking: Reported on 04/14/2014) 30 tablet 2   No current facility-administered medications on file prior to visit.   Family History  Problem Relation Age of Onset  . Hypertension Mother   . Asthma Son   . Asthma Son   . Asthma Daughter    History   Social History  . Marital Status: Single    Spouse Name: N/A    Number of Children: N/A  . Years of Education: N/A   Occupational History  . Not on file.   Social History Main Topics  . Smoking status: Never Smoker   . Smokeless tobacco: Not on file  . Alcohol Use: No  . Drug Use: No  . Sexual Activity: Yes   Other Topics Concern  . Not on file   Social History Narrative   Uses a cane occasionally.  Does not work.      Review of Systems: Constitutional: Negative  for fever, chills, diaphoresis, activity change, appetite change and fatigue. HENT: Negative for ear pain, nosebleeds, congestion, facial swelling, rhinorrhea, neck pain, neck stiffness and ear discharge.  Eyes: Negative for pain, discharge, redness, itching and visual disturbance. Respiratory: Negative for cough, choking, chest tightness, shortness of breath, wheezing and stridor.  Cardiovascular: Negative for chest pain, palpitations and leg swelling. Gastrointestinal: Negative for abdominal distention. Genitourinary: Negative for dysuria, urgency, frequency, hematuria, flank pain, decreased urine volume, difficulty urinating and dyspareunia.  Musculoskeletal: Negative  for back pain, joint swelling, arthralgias and gait problem. Neurological: Negative for dizziness, tremors, seizures, syncope, facial asymmetry, speech difficulty, weakness, light-headedness, numbness and headaches.  Hematological: Negative for adenopathy. Does not bruise/bleed easily. Psychiatric/Behavioral: Negative for hallucinations, behavioral problems, confusion. As noted in the HPI she describes dysphoric mood, decreased concentration and agitation.    Objective:   Filed Vitals:   05/26/14 1047  BP: 127/74  Pulse: 76  Temp: 98.8 F (37.1 C)  Resp: 16    Physical Exam: Constitutional: Patient appears well-developed and well-nourished. No distress. HENT: Normocephalic, atraumatic, External right and left ear normal. Oropharynx is clear and moist.  Eyes: Conjunctivae and EOM are normal. PERRLA, no scleral icterus. Neck: Normal ROM. Neck supple. No JVD. No tracheal deviation. No thyromegaly. CVS: RRR, S1/S2 +, no murmurs, no gallops, no carotid bruit.  Pulmonary: Effort and breath sounds normal, no stridor, rhonchi, wheezes, rales.  Abdominal: Soft. BS +,  no distension, tenderness, rebound or guarding.  Musculoskeletal: Normal range of motion. No edema and no tenderness.  Lymphadenopathy: No lymphadenopathy noted, cervical, inguinal or axillary Neuro: Alert. Normal reflexes, muscle tone coordination. No cranial nerve deficit. Skin: Skin is warm and dry. No rash noted. Not diaphoretic. No erythema. No pallor. Psychiatric: Normal mood and affect. Behavior, judgment, thought content normal.  Lab Results  Component Value Date   WBC 7.5 04/14/2014   HGB 9.1* 04/14/2014   HCT 26.3* 04/14/2014   MCV 71.9* 04/14/2014   PLT 131* 04/14/2014   Lab Results  Component Value Date   CREATININE 0.83 04/14/2014   BUN 12 04/14/2014   NA 138 04/14/2014   K 4.1 04/14/2014   CL 103 04/14/2014   CO2 25 04/14/2014    Lab Results  Component Value Date   HGBA1C 4.0 08/27/2013   Lipid  Panel  No results found for: CHOL, TRIG, HDL, CHOLHDL, VLDL, LDLCALC     Assessment and plan:   Patient Active Problem List   Diagnosis Date Noted  . Chronic prescription opiate use 01/12/2014  . Depression, major 10/07/2013  . PTSD (post-traumatic stress disorder) 10/07/2013  . Medication side effects 10/07/2013  . Unspecified constipation 09/27/2013  . Weight gain 09/27/2013  . Irregular menstrual cycle 05/12/2013  . Panic anxiety syndrome 05/11/2013  . Insomnia 05/11/2013  . Pap smear for cervical cancer screening 05/11/2013  . Sickle cell disease, type [place] 02/15/2013  . Chronic pain syndrome 01/06/2013  . Depressive disorder, not elsewhere classified 10/20/2012  . Generalized anxiety disorder 10/20/2012  . Opiate analgesic contract exists 09/11/2012  . Anemia 12/07/2011  . Leg pain 12/07/2011  . Thrombocytopenia 12/07/2011     PLAN: I have had a long discussion with Ms. Rayson about the fact that  Benzodiazepinesonly treat symptoms but does but does nothing to address the root of her anxiety. Therapy is required for appropriate management of her disorder.  Furthermore, we discussed the risks and benefits of this medication  and the risk of dependence and tolerance being an undesirable effect  especially in the setting of a patient who has a strong history of addiction in both of her parents and who also requires Opiates for management of the pain associated with Sickle Cell Disease.  I have started Buspar 7.5 mg TID with a plan to titrate up and wean Benzodiazepine (Ativan).  Greater than 50% of time spent in discussion and developing plan of care with regard to above.  New [place]uwe., MD [place], [place]  06/06/2014, 9:21 AM

## 2014-06-08 ENCOUNTER — Other Ambulatory Visit: Payer: Self-pay

## 2014-06-08 ENCOUNTER — Telehealth: Payer: Self-pay | Admitting: Internal Medicine

## 2014-06-08 DIAGNOSIS — F419 Anxiety disorder, unspecified: Secondary | ICD-10-CM

## 2014-06-08 DIAGNOSIS — D5720 Sickle-cell/Hb-C disease without crisis: Secondary | ICD-10-CM

## 2014-06-08 MED ORDER — BUSPIRONE HCL 7.5 MG PO TABS: 7.5000 mg | ORAL_TABLET | Freq: Three times a day (TID) | ORAL | Status: DC

## 2014-06-08 MED ORDER — MORPHINE SULFATE 15 MG PO TABS: 22.5000 mg | ORAL_TABLET | ORAL | Status: DC | PRN

## 2014-06-08 NOTE — Telephone Encounter (Signed)
Prescription written for MS IR 15 mg #135. NCCSRS reviewed and no inconsistencies noted.

## 2014-06-08 NOTE — Telephone Encounter (Signed)
Refill request for morphine 15mg . LOV 06/06/2014. Please advise. Thanks!

## 2014-06-09 ENCOUNTER — Encounter (HOSPITAL_COMMUNITY): Payer: Self-pay

## 2014-06-09 ENCOUNTER — Non-Acute Institutional Stay (HOSPITAL_BASED_OUTPATIENT_CLINIC_OR_DEPARTMENT_OTHER)
Admission: AD | Admit: 2014-06-09 | Discharge: 2014-06-09 | Disposition: A | Discharge status: Home / Self Care | Payer: Medicare Other | Source: Home / Self Care | Attending: Internal Medicine | Admitting: Internal Medicine

## 2014-06-09 ENCOUNTER — Telehealth (HOSPITAL_COMMUNITY): Payer: Self-pay | Admitting: Hematology

## 2014-06-09 DIAGNOSIS — Z9049 Acquired absence of other specified parts of digestive tract: Secondary | ICD-10-CM

## 2014-06-09 DIAGNOSIS — N943 Premenstrual tension syndrome: Secondary | ICD-10-CM

## 2014-06-09 DIAGNOSIS — E860 Dehydration: Secondary | ICD-10-CM | POA: Insufficient documentation

## 2014-06-09 DIAGNOSIS — D5700 Hb-SS disease with crisis, unspecified: Secondary | ICD-10-CM | POA: Diagnosis not present

## 2014-06-09 DIAGNOSIS — Z79899 Other long term (current) drug therapy: Secondary | ICD-10-CM

## 2014-06-09 DIAGNOSIS — M79606 Pain in leg, unspecified: Secondary | ICD-10-CM | POA: Diagnosis present

## 2014-06-09 DIAGNOSIS — K219 Gastro-esophageal reflux disease without esophagitis: Secondary | ICD-10-CM

## 2014-06-09 DIAGNOSIS — K810 Acute cholecystitis: Secondary | ICD-10-CM

## 2014-06-09 DIAGNOSIS — D57219 Sickle-cell/Hb-C disease with crisis, unspecified: Secondary | ICD-10-CM | POA: Insufficient documentation

## 2014-06-09 DIAGNOSIS — R079 Chest pain, unspecified: Secondary | ICD-10-CM | POA: Diagnosis not present

## 2014-06-09 DIAGNOSIS — D5720 Sickle-cell/Hb-C disease without crisis: Secondary | ICD-10-CM | POA: Diagnosis not present

## 2014-06-09 DIAGNOSIS — F329 Major depressive disorder, single episode, unspecified: Secondary | ICD-10-CM | POA: Insufficient documentation

## 2014-06-09 DIAGNOSIS — R000 Tachycardia, unspecified: Secondary | ICD-10-CM | POA: Diagnosis not present

## 2014-06-09 DIAGNOSIS — Z882 Allergy status to sulfonamides status: Secondary | ICD-10-CM

## 2014-06-09 DIAGNOSIS — D57819 Other sickle-cell disorders with crisis, unspecified: Secondary | ICD-10-CM | POA: Diagnosis not present

## 2014-06-09 LAB — CBC WITH DIFFERENTIAL/PLATELET
BASOS PCT: 0 % (ref 0–1)
Basophils Absolute: 0 10*3/uL (ref 0.0–0.1)
Eosinophils Absolute: 0.2 10*3/uL (ref 0.0–0.7)
Eosinophils Relative: 2 % (ref 0–5)
HCT: 30.9 % — ABNORMAL LOW (ref 36.0–46.0)
Hemoglobin: 10.3 g/dL — ABNORMAL LOW (ref 12.0–15.0)
LYMPHS PCT: 21 % (ref 12–46)
Lymphs Abs: 1.5 10*3/uL (ref 0.7–4.0)
MCH: 24.9 pg — AB (ref 26.0–34.0)
MCHC: 33.3 g/dL (ref 30.0–36.0)
MCV: 74.6 fL — ABNORMAL LOW (ref 78.0–100.0)
Monocytes Absolute: 0.4 10*3/uL (ref 0.1–1.0)
Monocytes Relative: 5 % (ref 3–12)
NEUTROS ABS: 5.2 10*3/uL (ref 1.7–7.7)
Neutrophils Relative %: 72 % (ref 43–77)
PLATELETS: 122 10*3/uL — AB (ref 150–400)
RBC: 4.14 MIL/uL (ref 3.87–5.11)
RDW: 17.9 % — ABNORMAL HIGH (ref 11.5–15.5)
WBC: 7.2 10*3/uL (ref 4.0–10.5)

## 2014-06-09 LAB — COMPREHENSIVE METABOLIC PANEL
ALT: 11 U/L (ref 0–35)
AST: 17 U/L (ref 0–37)
Albumin: 4.3 g/dL (ref 3.5–5.2)
Alkaline Phosphatase: 64 U/L (ref 39–117)
Anion gap: 7 (ref 5–15)
BUN: 17 mg/dL (ref 6–23)
CO2: 26 mmol/L (ref 19–32)
Calcium: 9.6 mg/dL (ref 8.4–10.5)
Chloride: 108 mmol/L (ref 96–112)
Creatinine, Ser: 0.79 mg/dL (ref 0.50–1.10)
GFR calc Af Amer: >90 mL/min (ref >90)
GFR calc non Af Amer: >90 mL/min (ref >90)
GLUCOSE: 116 mg/dL — AB (ref 70–99)
POTASSIUM: 4.1 mmol/L (ref 3.5–5.1)
SODIUM: 141 mmol/L (ref 135–145)
TOTAL PROTEIN: 7.8 g/dL (ref 6.0–8.3)
Total Bilirubin: 2.8 mg/dL — ABNORMAL HIGH (ref 0.3–1.2)

## 2014-06-09 LAB — RETICULOCYTES
RBC.: 4.14 MIL/uL (ref 3.87–5.11)
Retic Count, Absolute: 240.1 10*3/uL — ABNORMAL HIGH (ref 19.0–186.0)
Retic Ct Pct: 5.8 % — ABNORMAL HIGH (ref 0.4–3.1)

## 2014-06-09 LAB — LACTATE DEHYDROGENASE: LDH: 156 U/L (ref 94–250)

## 2014-06-09 MED ORDER — ONDANSETRON HCL 4 MG/2ML IJ SOLN
4.0000 mg | Freq: Four times a day (QID) | INTRAMUSCULAR | Status: DC | PRN
  Administered 2014-06-09: 4 mg via INTRAVENOUS
  Filled 2014-06-09 (×2): qty 2

## 2014-06-09 MED ORDER — NALOXONE HCL 0.4 MG/ML IJ SOLN: 0.4000 mg | INTRAMUSCULAR | Status: DC | PRN

## 2014-06-09 MED ORDER — SODIUM CHLORIDE 0.9 % IV SOLN
12.5000 mg | Freq: Four times a day (QID) | INTRAVENOUS | Status: DC | PRN
  Administered 2014-06-09: 12.5 mg via INTRAVENOUS
  Filled 2014-06-09 (×3): qty 0.25

## 2014-06-09 MED ORDER — FOLIC ACID 1 MG PO TABS
1.0000 mg | ORAL_TABLET | Freq: Every day | ORAL | Status: DC
  Administered 2014-06-09: 1 mg via ORAL
  Filled 2014-06-09: qty 1

## 2014-06-09 MED ORDER — DIPHENHYDRAMINE HCL 12.5 MG/5ML PO ELIX
12.5000 mg | ORAL_SOLUTION | Freq: Four times a day (QID) | ORAL | Status: DC | PRN
  Administered 2014-06-09: 12.5 mg via ORAL
  Filled 2014-06-09: qty 5

## 2014-06-09 MED ORDER — SENNOSIDES-DOCUSATE SODIUM 8.6-50 MG PO TABS: 1.0000 | ORAL_TABLET | Freq: Two times a day (BID) | ORAL | Status: DC

## 2014-06-09 MED ORDER — HYDROMORPHONE 2 MG/ML HIGH CONCENTRATION IV PCA SOLN
INTRAVENOUS | Status: DC
  Administered 2014-06-09: 11:00:00 via INTRAVENOUS
  Administered 2014-06-09: 12 mg via INTRAVENOUS
  Filled 2014-06-09: qty 25

## 2014-06-09 MED ORDER — DEXTROSE-NACL 5-0.45 % IV SOLN
INTRAVENOUS | Status: DC
  Administered 2014-06-09: 11:00:00 via INTRAVENOUS

## 2014-06-09 MED ORDER — SODIUM CHLORIDE 0.9 % IJ SOLN: 9.0000 mL | INTRAMUSCULAR | Status: DC | PRN

## 2014-06-09 MED ORDER — POLYETHYLENE GLYCOL 3350 17 G PO PACK
17.0000 g | PACK | Freq: Every day | ORAL | Status: DC | PRN
  Filled 2014-06-09: qty 1

## 2014-06-09 MED ORDER — MORPHINE SULFATE 15 MG PO TABS
15.0000 mg | ORAL_TABLET | Freq: Once | ORAL | Status: AC
  Administered 2014-06-09: 15 mg via ORAL
  Filled 2014-06-09: qty 1

## 2014-06-09 MED ORDER — KETOROLAC TROMETHAMINE 30 MG/ML IJ SOLN
30.0000 mg | Freq: Four times a day (QID) | INTRAMUSCULAR | Status: DC
  Administered 2014-06-09: 30 mg via INTRAVENOUS
  Filled 2014-06-09: qty 1

## 2014-06-09 NOTE — Progress Notes (Signed)
Patient ID: Cassie Montgomery, female   DOB: 12-Sep-1970, 44 y.o.   MRN: [id number]   Pt a&ox4, pt in no distress. Pt received discharge instructions, follow up appointment, all belongings, and prescription. PIV discontinued, dry gauze applied. Pt denies any further needs at this time. Pt ambulated to lobby as her ride is "down stairs already". Pt alert, gait steady.

## 2014-06-09 NOTE — Telephone Encounter (Signed)
Pt called c/o pain crisis in BUE and BLE pain 8/10. Per Dr. Jonelle Sidle, pt advised to come to the day hospital for evaluation.

## 2014-06-09 NOTE — H&P (Signed)
Cassie Montgomery is an 44 y.o. female.   Chief Complaint: Pain in limbs for 4 days HPI: A 44 yo woman with known history of sickle cell disease who is here with sickle cell painful crisis. Patient has been having symptoms for 4 days now. Has tried taking her home medications but not getting better. Her children were home due to bad weather and patient believe she caught some cold which may have triggered her symptoms. Pain is at 8/10 and non-radiating. It is sharp, worsened by movement and activities. Not relieved by anything.  Past Medical History  Diagnosis Date  . PMDD (premenstrual dysphoric disorder)   . Sickle cell anemia   . Sickle cell disease without crisis 07/15/2011  . Migraine with aura 07/15/2011  . GERD (gastroesophageal reflux disease)   . Asthma in adult     probable  . Depression     Past Surgical History  Procedure Laterality Date  . Cesarean section      2    Family History  Problem Relation Age of Onset  . Hypertension Mother   . Asthma Son   . Asthma Son   . Asthma Daughter    Social History:  reports that she has never smoked. She does not have any smokeless tobacco history on file. She reports that she does not drink alcohol or use illicit drugs.  Allergies:  Allergies  Allergen Reactions  . Sulfa Antibiotics Other (See Comments)    Increased bilirubin    Medications Prior to Admission  Medication Sig Dispense Refill  . albuterol (PROVENTIL HFA;VENTOLIN HFA) 108 (90 BASE) MCG/ACT inhaler Inhale 2 puffs into the lungs every 6 (six) hours as needed for wheezing or shortness of breath (wheezing).     . busPIRone (BUSPAR) 7.5 MG tablet Take 1 tablet (7.5 mg total) by mouth 3 (three) times daily. 60 tablet 0  . fluticasone (FLONASE) 50 MCG/ACT nasal spray Place 2 sprays into both nostrils daily as needed for allergies or rhinitis (allergies).     . folic acid (FOLVITE) 1 MG tablet Take 1 tablet (1 mg total) by mouth every morning. 30 tablet 11  . LORazepam  (ATIVAN) 1 MG tablet Take 1 tablet (1 mg total) by mouth every 8 (eight) hours as needed. for anxiety 90 tablet 5  . methadone (DOLOPHINE) 5 MG tablet Take 1 tablet (5 mg total) by mouth at bedtime. 30 tablet 0  . morphine (MSIR) 15 MG tablet Take 1.5 tablets (22.5 mg total) by mouth every 4 (four) hours as needed for severe pain. 135 tablet 0  . polyethylene glycol (MIRALAX / GLYCOLAX) packet Take 17 g by mouth 3 (three) times a week.    Marland Kitchen omeprazole (PRILOSEC) 40 MG capsule Take 40 mg by mouth every evening.    . ondansetron (ZOFRAN) 4 MG tablet Take 1 tablet (4 mg total) by mouth every 8 (eight) hours as needed for nausea or vomiting. (Patient not taking: Reported on 05/26/2014) 20 tablet 0  . senna (SENOKOT) 8.6 MG TABS tablet Take 2 tablets by mouth daily as needed for mild constipation.    . sertraline (ZOLOFT) 50 MG tablet TAKE 1 TABLET BY MOUTH EVERY DAY (Patient not taking: Reported on 04/14/2014) 30 tablet 2    Results for orders placed or performed during the hospital encounter of 06/09/14 (from the past 48 hour(s))  Lactate dehydrogenase     Status: None   Collection Time: 06/09/14 10:50 AM  Result Value Ref Range   LDH 156 94 -  250 U/L  Comprehensive metabolic panel     Status: Abnormal   Collection Time: 06/09/14 10:50 AM  Result Value Ref Range   Sodium 141 135 - 145 mmol/L   Potassium 4.1 3.5 - 5.1 mmol/L   Chloride 108 96 - 112 mmol/L   CO2 26 19 - 32 mmol/L   Glucose, Bld 116 (H) 70 - 99 mg/dL   BUN 17 6 - 23 mg/dL   Creatinine, Ser 0.79 0.50 - 1.10 mg/dL   Calcium 9.6 8.4 - 10.5 mg/dL   Total Protein 7.8 6.0 - 8.3 g/dL   Albumin 4.3 3.5 - 5.2 g/dL   AST 17 0 - 37 U/L   ALT 11 0 - 35 U/L   Alkaline Phosphatase 64 39 - 117 U/L   Total Bilirubin 2.8 (H) 0.3 - 1.2 mg/dL   GFR calc non Af Amer >90 >90 mL/min   GFR calc Af Amer >90 >90 mL/min    Comment: (NOTE) The eGFR has been calculated using the CKD EPI equation. This calculation has not been validated in all  clinical situations. eGFR's persistently <90 mL/min signify possible Chronic Kidney Disease.    Anion gap 7 5 - 15  CBC WITH DIFFERENTIAL     Status: Abnormal   Collection Time: 06/09/14 10:50 AM  Result Value Ref Range   WBC 7.2 4.0 - 10.5 K/uL   RBC 4.14 3.87 - 5.11 MIL/uL   Hemoglobin 10.3 (L) 12.0 - 15.0 g/dL   HCT 30.9 (L) 36.0 - 46.0 %   MCV 74.6 (L) 78.0 - 100.0 fL   MCH 24.9 (L) 26.0 - 34.0 pg   MCHC 33.3 30.0 - 36.0 g/dL   RDW 17.9 (H) 11.5 - 15.5 %   Platelets 122 (L) 150 - 400 K/uL   Neutrophils Relative % 72 43 - 77 %   Neutro Abs 5.2 1.7 - 7.7 K/uL   Lymphocytes Relative 21 12 - 46 %   Lymphs Abs 1.5 0.7 - 4.0 K/uL   Monocytes Relative 5 3 - 12 %   Monocytes Absolute 0.4 0.1 - 1.0 K/uL   Eosinophils Relative 2 0 - 5 %   Eosinophils Absolute 0.2 0.0 - 0.7 K/uL   Basophils Relative 0 0 - 1 %   Basophils Absolute 0.0 0.0 - 0.1 K/uL  Reticulocytes     Status: Abnormal   Collection Time: 06/09/14 10:50 AM  Result Value Ref Range   Retic Ct Pct 5.8 (H) 0.4 - 3.1 %   RBC. 4.14 3.87 - 5.11 MIL/uL   Retic Count, Manual 240.1 (H) 19.0 - 186.0 K/uL   No results found.  Review of Systems  Constitutional: Negative.   Eyes: Negative.   Respiratory: Negative.   Cardiovascular: Negative.   Gastrointestinal: Negative.   Genitourinary: Negative.   Musculoskeletal: Positive for myalgias, back pain and neck pain. Negative for falls.  Skin: Negative.   Neurological: Negative.   Endo/Heme/Allergies: Negative.   Psychiatric/Behavioral: Negative.     Blood pressure 110/67, pulse 81, temperature 97.3 F (36.3 C), temperature source Oral, resp. rate 16, height 5' 7.2" (1.707 m), weight 101.152 kg (223 lb), last menstrual period 05/17/2014, SpO2 100 %. Physical Exam  Constitutional: She is oriented to person, place, and time. She appears well-developed and well-nourished.  HENT:  Head: Normocephalic and atraumatic.  Right Ear: External ear normal.  Mouth/Throat: Oropharynx  is clear and moist.  Eyes: Conjunctivae and EOM are normal. Pupils are equal, round, and reactive to light.  Neck:  Normal range of motion. Neck supple.  Cardiovascular: Normal rate, regular rhythm, normal heart sounds and intact distal pulses.   Respiratory: Effort normal and breath sounds normal.  GI: Soft. Bowel sounds are normal.  Musculoskeletal: Normal range of motion. She exhibits tenderness.  Neurological: She is alert and oriented to person, place, and time. She has normal reflexes.  Skin: Skin is warm and dry.     Assessment/Plan A 44 yo female with sickle cell disease here with sickle cell Painful crisis.  #1. Sickle cell Painful Crisis: patient will be admitted to the day hospital for pain management. Will start IV Dilaudid PCA, Toradol as well as IVF. Will continue oral medications as well.  #2 Sickle Cell Anemia: Will monitor H&H. If needed, will transfuse.  #3. Dehydration: will hydrate.  Cassie Montgomery,LAWAL 06/09/2014, 4:24 PM

## 2014-06-09 NOTE — Discharge Summary (Signed)
[facility] Discharge Summary   Patient ID: Cassie Montgomery MRN: [id number] DOB/AGE: 06/29/1970 44 y.o.  Admit date: 06/09/2014 Discharge date: 06/09/2014  Primary Care Physician:  MATTHEWS,MICHELLE A., MD  Admission Diagnoses:  Active Problems:   Leg pain   Sickle cell disease, type [place]   Discharge Diagnoses:   Sickle cell anemia  Discharge Medications:    Medication List    TAKE these medications        albuterol 108 (90 BASE) MCG/ACT inhaler  Commonly known as:  PROVENTIL HFA;VENTOLIN HFA  Inhale 2 puffs into the lungs every 6 (six) hours as needed for wheezing or shortness of breath (wheezing).     busPIRone 7.5 MG tablet  Commonly known as:  BUSPAR  Take 1 tablet (7.5 mg total) by mouth 3 (three) times daily.     fluticasone 50 MCG/ACT nasal spray  Commonly known as:  FLONASE  Place 2 sprays into both nostrils daily as needed for allergies or rhinitis (allergies).     folic acid 1 MG tablet  Commonly known as:  FOLVITE  Take 1 tablet (1 mg total) by mouth every morning.     LORazepam 1 MG tablet  Commonly known as:  ATIVAN  Take 1 tablet (1 mg total) by mouth every 8 (eight) hours as needed. for anxiety     methadone 5 MG tablet  Commonly known as:  DOLOPHINE  Take 1 tablet (5 mg total) by mouth at bedtime.     morphine 15 MG tablet  Commonly known as:  MSIR  Take 1.5 tablets (22.5 mg total) by mouth every 4 (four) hours as needed for severe pain.     omeprazole 40 MG capsule  Commonly known as:  PRILOSEC  Take 40 mg by mouth every evening.     ondansetron 4 MG tablet  Commonly known as:  ZOFRAN  Take 1 tablet (4 mg total) by mouth every 8 (eight) hours as needed for nausea or vomiting.     polyethylene glycol packet  Commonly known as:  MIRALAX / GLYCOLAX  Take 17 g by mouth 3 (three) times a week.     senna 8.6 MG Tabs tablet  Commonly known as:  SENOKOT  Take 2 tablets by mouth daily as needed for mild constipation.      sertraline 50 MG tablet  Commonly known as:  ZOLOFT  TAKE 1 TABLET BY MOUTH EVERY DAY         Consults:  None  Significant Diagnostic Studies:  No results found.   [facility] Course:  Cassie Montgomery was admitted to the sickle cell day infusion center for extended observation. The patient was started on hypotonic IV fluids for cellular rehydration. She was given Toradol 30 mg IV times one for inflammation. Cassie Montgomery was started on a high concentration PCA for pain management throughout observation. She used a total of 12 mg, with 27 demands and 24 deliveries. I reviewed patient's laboratory values, consistent with baseline results.  Patient given oral pain medication 30 minutes prior to discharge. Pain intensity is currently 6/10, which is at goal. Patient is alert, oriented, and ambulatory. Cassie Montgomery is stable and will be discharged home with family. Extended observation plan was outlined and reviewed by Dr. Julious Oka.  Physical Exam at Discharge:  BP 110/67 mmHg  Pulse 81  Temp(Src) 97.3 F (36.3 C) (Oral)  Resp 16  Ht 5' 7.2" (1.707 m)  Wt 223 lb (101.152 kg)  BMI 34.71  kg/m2  SpO2 100%  LMP 05/17/2014  Constitutional: Patient appears well-developed and well-nourished. No distress. HENT: Normocephalic, atraumatic, External right and left ear normal. Oropharynx is clear and moist.  Eyes: Conjunctivae and EOM are normal. PERRLA, no scleral icterus. Neck: Normal ROM. Neck supple. No JVD. No tracheal deviation. No thyromegaly. CVS: RRR, S1/S2 +, no murmurs, no gallops, no carotid bruit.  Pulmonary: Effort and breath sounds normal, no stridor, rhonchi, wheezes, rales.  Abdominal: Soft. BS +, no distension, tenderness, rebound or guarding.  Musculoskeletal: Normal range of motion. No edema and no tenderness.  Lymphadenopathy: No lymphadenopathy noted, cervical, inguinal or axillary Neuro: Alert. Normal reflexes, muscle tone coordination. No cranial nerve  deficit. Skin: Skin is warm and dry. No rash noted. Not diaphoretic. No erythema. No pallor. Psychiatric: Normal mood and affect. Behavior, judgment, thought content normal.  Disposition at Discharge: 01-Home or Self Care  Condition at Discharge:   Stable  Time spent on Discharge:  Greater than 30 minutes.  Signed: Aftan Vint M 06/09/2014, 4:04 PM

## 2014-06-10 ENCOUNTER — Telehealth (HOSPITAL_COMMUNITY): Payer: Self-pay | Admitting: Internal Medicine

## 2014-06-10 DIAGNOSIS — K810 Acute cholecystitis: Secondary | ICD-10-CM

## 2014-06-10 NOTE — Telephone Encounter (Signed)
Pt called to report continued uncontrolled pain in LUE after leaving sickle cell center on 1/28. Pt states this pain is the same sickle cell crisis pain she was having before. Pt requesting increase in home medication dose as she is unable to come to sickle cell center for treatment today due to "her daughter getting out of school soon". Will notify provider and return call to pt.

## 2014-06-10 NOTE — Telephone Encounter (Signed)
Returned call to pt after speaking with [place] hollis, FNP. Pt instructed to increase dose of home med to 30mg  x1 for her next scheduled dose, take her routine folic acid and hydrea, hydrate with 56ml of water, and revert to regular scheduled dose of home med of morphine IR 15mg , 1.5 tab Q4hr. Pt instructed to report to ED if pain remains uncontrolled, as I was instructed by provider. Pt verbalized understanding of these instructions. Pt denies any further needs at this time.

## 2014-06-11 ENCOUNTER — Emergency Department (HOSPITAL_COMMUNITY): Payer: Medicare Other

## 2014-06-11 ENCOUNTER — Encounter (HOSPITAL_COMMUNITY): Payer: Self-pay | Admitting: Emergency Medicine

## 2014-06-11 ENCOUNTER — Inpatient Hospital Stay (HOSPITAL_COMMUNITY)
Admission: EM | Admit: 2014-06-11 | Discharge: 2014-06-15 | DRG: 812 | Disposition: A | Discharge status: Home / Self Care | Payer: Medicare Other | Attending: Internal Medicine | Admitting: Internal Medicine

## 2014-06-11 DIAGNOSIS — Z9049 Acquired absence of other specified parts of digestive tract: Secondary | ICD-10-CM

## 2014-06-11 DIAGNOSIS — R339 Retention of urine, unspecified: Secondary | ICD-10-CM | POA: Diagnosis present

## 2014-06-11 DIAGNOSIS — D5700 Hb-SS disease with crisis, unspecified: Secondary | ICD-10-CM | POA: Diagnosis not present

## 2014-06-11 DIAGNOSIS — D696 Thrombocytopenia, unspecified: Secondary | ICD-10-CM | POA: Diagnosis present

## 2014-06-11 DIAGNOSIS — D649 Anemia, unspecified: Secondary | ICD-10-CM | POA: Diagnosis present

## 2014-06-11 DIAGNOSIS — J45909 Unspecified asthma, uncomplicated: Secondary | ICD-10-CM | POA: Diagnosis present

## 2014-06-11 DIAGNOSIS — R000 Tachycardia, unspecified: Secondary | ICD-10-CM | POA: Diagnosis not present

## 2014-06-11 DIAGNOSIS — R079 Chest pain, unspecified: Secondary | ICD-10-CM | POA: Diagnosis not present

## 2014-06-11 DIAGNOSIS — K5900 Constipation, unspecified: Secondary | ICD-10-CM | POA: Diagnosis not present

## 2014-06-11 DIAGNOSIS — D5720 Sickle-cell/Hb-C disease without crisis: Secondary | ICD-10-CM | POA: Diagnosis present

## 2014-06-11 DIAGNOSIS — Z79899 Other long term (current) drug therapy: Secondary | ICD-10-CM

## 2014-06-11 DIAGNOSIS — F329 Major depressive disorder, single episode, unspecified: Secondary | ICD-10-CM | POA: Diagnosis present

## 2014-06-11 DIAGNOSIS — F411 Generalized anxiety disorder: Secondary | ICD-10-CM | POA: Diagnosis present

## 2014-06-11 DIAGNOSIS — J4520 Mild intermittent asthma, uncomplicated: Secondary | ICD-10-CM

## 2014-06-11 DIAGNOSIS — K219 Gastro-esophageal reflux disease without esophagitis: Secondary | ICD-10-CM | POA: Diagnosis present

## 2014-06-11 DIAGNOSIS — F1190 Opioid use, unspecified, uncomplicated: Secondary | ICD-10-CM | POA: Diagnosis present

## 2014-06-11 DIAGNOSIS — D57819 Other sickle-cell disorders with crisis, unspecified: Secondary | ICD-10-CM | POA: Diagnosis not present

## 2014-06-11 MED ORDER — KETOROLAC TROMETHAMINE 30 MG/ML IJ SOLN
30.0000 mg | Freq: Once | INTRAMUSCULAR | Status: AC
  Administered 2014-06-12: 30 mg via INTRAVENOUS
  Filled 2014-06-11: qty 1

## 2014-06-11 MED ORDER — SODIUM CHLORIDE 0.9 % IV BOLUS (SEPSIS)
500.0000 mL | Freq: Once | INTRAVENOUS | Status: AC
  Administered 2014-06-12: 500 mL via INTRAVENOUS

## 2014-06-11 MED ORDER — HYDROMORPHONE HCL 1 MG/ML IJ SOLN
1.0000 mg | Freq: Once | INTRAMUSCULAR | Status: AC
  Administered 2014-06-12: 1 mg via INTRAVENOUS
  Filled 2014-06-11: qty 1

## 2014-06-11 NOTE — ED Notes (Signed)
Pt c/o SSC pain to chest, arms and shoulders. States she has worsening chest pain to L upper chest that started about 30 min PTA. Pt states chest pain is constant, throbbing and worse with inspiration. Chest pain radiates to L shoulder and under L arm. C/o nausea also.  Pt states she was seen at [facility] clinic 2 days ago and felt better then got worse. States the provider at the [facility] clinic told her to double up on her pain meds. Pt reports that she has taken double her 15 mg morphine today, but it is not helping. States she drove herself here.

## 2014-06-12 DIAGNOSIS — J4520 Mild intermittent asthma, uncomplicated: Secondary | ICD-10-CM | POA: Diagnosis not present

## 2014-06-12 DIAGNOSIS — R339 Retention of urine, unspecified: Secondary | ICD-10-CM | POA: Diagnosis present

## 2014-06-12 DIAGNOSIS — F411 Generalized anxiety disorder: Secondary | ICD-10-CM

## 2014-06-12 DIAGNOSIS — K210 Gastro-esophageal reflux disease with esophagitis: Secondary | ICD-10-CM | POA: Diagnosis not present

## 2014-06-12 DIAGNOSIS — D5700 Hb-SS disease with crisis, unspecified: Secondary | ICD-10-CM | POA: Diagnosis present

## 2014-06-12 DIAGNOSIS — R000 Tachycardia, unspecified: Secondary | ICD-10-CM | POA: Diagnosis present

## 2014-06-12 DIAGNOSIS — Z9049 Acquired absence of other specified parts of digestive tract: Secondary | ICD-10-CM | POA: Diagnosis present

## 2014-06-12 DIAGNOSIS — J45909 Unspecified asthma, uncomplicated: Secondary | ICD-10-CM | POA: Diagnosis present

## 2014-06-12 DIAGNOSIS — K219 Gastro-esophageal reflux disease without esophagitis: Secondary | ICD-10-CM | POA: Diagnosis present

## 2014-06-12 DIAGNOSIS — Z79899 Other long term (current) drug therapy: Secondary | ICD-10-CM | POA: Diagnosis not present

## 2014-06-12 DIAGNOSIS — K5909 Other constipation: Secondary | ICD-10-CM | POA: Diagnosis not present

## 2014-06-12 DIAGNOSIS — D696 Thrombocytopenia, unspecified: Secondary | ICD-10-CM | POA: Diagnosis present

## 2014-06-12 DIAGNOSIS — K5900 Constipation, unspecified: Secondary | ICD-10-CM | POA: Diagnosis not present

## 2014-06-12 DIAGNOSIS — T50905A Adverse effect of unspecified drugs, medicaments and biological substances, initial encounter: Secondary | ICD-10-CM | POA: Diagnosis not present

## 2014-06-12 DIAGNOSIS — F329 Major depressive disorder, single episode, unspecified: Secondary | ICD-10-CM | POA: Diagnosis present

## 2014-06-12 DIAGNOSIS — D57219 Sickle-cell/Hb-C disease with crisis, unspecified: Secondary | ICD-10-CM | POA: Diagnosis not present

## 2014-06-12 DIAGNOSIS — K5901 Slow transit constipation: Secondary | ICD-10-CM | POA: Diagnosis not present

## 2014-06-12 DIAGNOSIS — F1190 Opioid use, unspecified, uncomplicated: Secondary | ICD-10-CM | POA: Diagnosis present

## 2014-06-12 LAB — CBC WITH DIFFERENTIAL/PLATELET
Basophils Absolute: 0 10*3/uL (ref 0.0–0.1)
Basophils Relative: 1 % (ref 0–1)
EOS ABS: 0.3 10*3/uL (ref 0.0–0.7)
Eosinophils Relative: 4 % (ref 0–5)
HEMATOCRIT: 25.3 % — AB (ref 36.0–46.0)
HEMOGLOBIN: 8.4 g/dL — AB (ref 12.0–15.0)
Lymphocytes Relative: 30 % (ref 12–46)
Lymphs Abs: 2.3 10*3/uL (ref 0.7–4.0)
MCH: 24.6 pg — AB (ref 26.0–34.0)
MCHC: 33.2 g/dL (ref 30.0–36.0)
MCV: 74 fL — AB (ref 78.0–100.0)
MONOS PCT: 7 % (ref 3–12)
Monocytes Absolute: 0.5 10*3/uL (ref 0.1–1.0)
NEUTROS PCT: 59 % (ref 43–77)
Neutro Abs: 4.7 10*3/uL (ref 1.7–7.7)
Platelets: 121 10*3/uL — ABNORMAL LOW (ref 150–400)
RBC: 3.42 MIL/uL — AB (ref 3.87–5.11)
RDW: 17.9 % — ABNORMAL HIGH (ref 11.5–15.5)
WBC: 7.9 10*3/uL (ref 4.0–10.5)

## 2014-06-12 LAB — COMPREHENSIVE METABOLIC PANEL
ALBUMIN: 4.1 g/dL (ref 3.5–5.2)
ALK PHOS: 53 U/L (ref 39–117)
ALT: 11 U/L (ref 0–35)
AST: 17 U/L (ref 0–37)
Anion gap: 6 (ref 5–15)
BILIRUBIN TOTAL: 2.5 mg/dL — AB (ref 0.3–1.2)
BUN: 15 mg/dL (ref 6–23)
CALCIUM: 9.1 mg/dL (ref 8.4–10.5)
CHLORIDE: 104 mmol/L (ref 96–112)
CO2: 27 mmol/L (ref 19–32)
CREATININE: 0.68 mg/dL (ref 0.50–1.10)
GFR calc Af Amer: >90 mL/min (ref >90)
GFR calc non Af Amer: >90 mL/min (ref >90)
GLUCOSE: 98 mg/dL (ref 70–99)
Potassium: 3.6 mmol/L (ref 3.5–5.1)
Sodium: 137 mmol/L (ref 135–145)
Total Protein: 7.1 g/dL (ref 6.0–8.3)

## 2014-06-12 LAB — RETICULOCYTES
RBC.: 3.42 MIL/uL — AB (ref 3.87–5.11)
RETIC CT PCT: 6 % — AB (ref 0.4–3.1)
Retic Count, Absolute: 205.2 10*3/uL — ABNORMAL HIGH (ref 19.0–186.0)

## 2014-06-12 LAB — LACTATE DEHYDROGENASE: LDH: 153 U/L (ref 94–250)

## 2014-06-12 MED ORDER — ENOXAPARIN SODIUM 40 MG/0.4ML ~~LOC~~ SOLN
40.0000 mg | SUBCUTANEOUS | Status: DC
  Administered 2014-06-12 – 2014-06-14 (×3): 40 mg via SUBCUTANEOUS
  Filled 2014-06-12 (×4): qty 0.4

## 2014-06-12 MED ORDER — DIPHENHYDRAMINE HCL 12.5 MG/5ML PO ELIX: 12.5000 mg | ORAL_SOLUTION | Freq: Four times a day (QID) | ORAL | Status: DC | PRN

## 2014-06-12 MED ORDER — ONDANSETRON HCL 4 MG PO TABS: 4.0000 mg | ORAL_TABLET | Freq: Three times a day (TID) | ORAL | Status: DC | PRN

## 2014-06-12 MED ORDER — SODIUM CHLORIDE 0.9 % IJ SOLN: 9.0000 mL | INTRAMUSCULAR | Status: DC | PRN

## 2014-06-12 MED ORDER — KETOROLAC TROMETHAMINE 30 MG/ML IJ SOLN
30.0000 mg | Freq: Four times a day (QID) | INTRAMUSCULAR | Status: DC
  Administered 2014-06-12 – 2014-06-15 (×11): 30 mg via INTRAVENOUS
  Filled 2014-06-12 (×16): qty 1

## 2014-06-12 MED ORDER — SENNA 8.6 MG PO TABS
2.0000 | ORAL_TABLET | Freq: Every day | ORAL | Status: DC | PRN
Start: 2014-06-12 — End: 2014-06-15
  Administered 2014-06-12: 17.2 mg via ORAL

## 2014-06-12 MED ORDER — ALUM & MAG HYDROXIDE-SIMETH 200-200-20 MG/5ML PO SUSP: 15.0000 mL | ORAL | Status: DC | PRN

## 2014-06-12 MED ORDER — METHADONE HCL 5 MG PO TABS
5.0000 mg | ORAL_TABLET | Freq: Every day | ORAL | Status: DC
  Administered 2014-06-12 – 2014-06-14 (×3): 5 mg via ORAL
  Filled 2014-06-12 (×3): qty 1

## 2014-06-12 MED ORDER — FLUTICASONE PROPIONATE 50 MCG/ACT NA SUSP
2.0000 | Freq: Every day | NASAL | Status: DC | PRN
  Filled 2014-06-12: qty 16

## 2014-06-12 MED ORDER — DIPHENHYDRAMINE HCL 25 MG PO CAPS
25.0000 mg | ORAL_CAPSULE | ORAL | Status: DC | PRN
  Administered 2014-06-13 – 2014-06-14 (×3): 25 mg via ORAL
  Filled 2014-06-12 (×3): qty 1

## 2014-06-12 MED ORDER — ALBUTEROL SULFATE (2.5 MG/3ML) 0.083% IN NEBU
2.5000 mg | INHALATION_SOLUTION | Freq: Four times a day (QID) | RESPIRATORY_TRACT | Status: DC | PRN
Start: 2014-06-12 — End: 2014-06-15
  Administered 2014-06-13 – 2014-06-14 (×2): 2.5 mg via RESPIRATORY_TRACT
  Filled 2014-06-12 (×2): qty 3

## 2014-06-12 MED ORDER — ALUM & MAG HYDROXIDE-SIMETH 200-200-20 MG/5ML PO SUSP
15.0000 mL | ORAL | Status: DC | PRN
  Administered 2014-06-13: 15 mL via ORAL
  Filled 2014-06-12: qty 30

## 2014-06-12 MED ORDER — HYDROMORPHONE 2 MG/ML HIGH CONCENTRATION IV PCA SOLN
INTRAVENOUS | Status: DC
  Administered 2014-06-12: 5.4 mg via INTRAVENOUS
  Administered 2014-06-12: 4.8 mL via INTRAVENOUS
  Administered 2014-06-12: 3 mg via INTRAVENOUS
  Administered 2014-06-13: 12 mg via INTRAVENOUS
  Administered 2014-06-13: 1.2 mg via INTRAVENOUS
  Administered 2014-06-13: 25 mL via INTRAVENOUS
  Administered 2014-06-13: 10.2 mg via INTRAVENOUS
  Administered 2014-06-13: 6.6 mg via INTRAVENOUS
  Administered 2014-06-14 (×2): 4.2 mg via INTRAVENOUS
  Administered 2014-06-14: 6.6 mg via INTRAVENOUS
  Administered 2014-06-14: 7.2 mg via INTRAVENOUS
  Administered 2014-06-14: 13.8 mg via INTRAVENOUS
  Administered 2014-06-14: 4 mg via INTRAVENOUS
  Administered 2014-06-14: 4.8 mg via INTRAVENOUS
  Administered 2014-06-15: 5.4 mg via INTRAVENOUS
  Administered 2014-06-15: 7.2 mg via INTRAVENOUS
  Administered 2014-06-15: 9.6 mg via INTRAVENOUS
  Filled 2014-06-12 (×4): qty 25

## 2014-06-12 MED ORDER — ONDANSETRON HCL 4 MG/2ML IJ SOLN
4.0000 mg | Freq: Four times a day (QID) | INTRAMUSCULAR | Status: DC | PRN
  Administered 2014-06-14: 4 mg via INTRAVENOUS
  Filled 2014-06-12: qty 2

## 2014-06-12 MED ORDER — HYDROMORPHONE 0.3 MG/ML IV SOLN
INTRAVENOUS | Status: DC
  Administered 2014-06-12: 25 mg via INTRAVENOUS
  Filled 2014-06-12 (×2): qty 25

## 2014-06-12 MED ORDER — DIPHENHYDRAMINE HCL 50 MG/ML IJ SOLN
12.5000 mg | Freq: Once | INTRAMUSCULAR | Status: AC
  Administered 2014-06-12: 12.5 mg via INTRAVENOUS
  Filled 2014-06-12: qty 1

## 2014-06-12 MED ORDER — FOLIC ACID 1 MG PO TABS
1.0000 mg | ORAL_TABLET | Freq: Every morning | ORAL | Status: DC
  Administered 2014-06-12 – 2014-06-15 (×4): 1 mg via ORAL
  Filled 2014-06-12 (×4): qty 1

## 2014-06-12 MED ORDER — HYDROMORPHONE HCL 2 MG/ML IJ SOLN: 2.0000 mg | INTRAMUSCULAR | Status: DC | PRN

## 2014-06-12 MED ORDER — DIPHENHYDRAMINE HCL 50 MG/ML IJ SOLN
25.0000 mg | Freq: Once | INTRAMUSCULAR | Status: AC
  Administered 2014-06-12: 25 mg via INTRAVENOUS
  Filled 2014-06-12: qty 1
  Filled 2014-06-12: qty 0.5

## 2014-06-12 MED ORDER — HYDROMORPHONE HCL 1 MG/ML IJ SOLN
1.0000 mg | Freq: Once | INTRAMUSCULAR | Status: AC
  Administered 2014-06-12: 1 mg via INTRAVENOUS
  Filled 2014-06-12: qty 1

## 2014-06-12 MED ORDER — IBUPROFEN 200 MG PO TABS: 200.0000 mg | ORAL_TABLET | Freq: Four times a day (QID) | ORAL | Status: DC | PRN

## 2014-06-12 MED ORDER — INFLUENZA VAC SPLIT QUAD 0.5 ML IM SUSY
0.5000 mL | PREFILLED_SYRINGE | INTRAMUSCULAR | Status: AC
  Administered 2014-06-13: 0.5 mL via INTRAMUSCULAR
  Filled 2014-06-12 (×2): qty 0.5

## 2014-06-12 MED ORDER — DIPHENHYDRAMINE HCL 25 MG PO CAPS: 25.0000 mg | ORAL_CAPSULE | Freq: Four times a day (QID) | ORAL | Status: DC | PRN

## 2014-06-12 MED ORDER — SODIUM CHLORIDE 0.9 % IV BOLUS (SEPSIS)
1000.0000 mL | Freq: Once | INTRAVENOUS | Status: AC
  Administered 2014-06-12: 1000 mL via INTRAVENOUS

## 2014-06-12 MED ORDER — HYDROMORPHONE HCL 2 MG/ML IJ SOLN
2.0000 mg | Freq: Once | INTRAMUSCULAR | Status: AC
  Administered 2014-06-12: 2 mg via INTRAVENOUS
  Filled 2014-06-12: qty 1

## 2014-06-12 MED ORDER — LORAZEPAM 1 MG PO TABS
1.0000 mg | ORAL_TABLET | Freq: Three times a day (TID) | ORAL | Status: DC | PRN
  Administered 2014-06-13 – 2014-06-14 (×2): 1 mg via ORAL
  Filled 2014-06-12 (×2): qty 1

## 2014-06-12 MED ORDER — NALOXONE HCL 0.4 MG/ML IJ SOLN: 0.4000 mg | INTRAMUSCULAR | Status: DC | PRN

## 2014-06-12 MED ORDER — ONDANSETRON HCL 4 MG/2ML IJ SOLN: 4.0000 mg | Freq: Four times a day (QID) | INTRAMUSCULAR | Status: DC | PRN

## 2014-06-12 MED ORDER — PANTOPRAZOLE SODIUM 40 MG PO TBEC
40.0000 mg | DELAYED_RELEASE_TABLET | Freq: Every day | ORAL | Status: DC
  Administered 2014-06-12 – 2014-06-15 (×4): 40 mg via ORAL
  Filled 2014-06-12 (×4): qty 1

## 2014-06-12 MED ORDER — PNEUMOCOCCAL VAC POLYVALENT 25 MCG/0.5ML IJ INJ
0.5000 mL | INJECTION | INTRAMUSCULAR | Status: AC
  Administered 2014-06-13: 0.5 mL via INTRAMUSCULAR
  Filled 2014-06-12 (×2): qty 0.5

## 2014-06-12 MED ORDER — BUSPIRONE HCL 15 MG PO TABS
7.5000 mg | ORAL_TABLET | Freq: Three times a day (TID) | ORAL | Status: DC
Start: 2014-06-12 — End: 2014-06-15
  Administered 2014-06-12 – 2014-06-15 (×10): 7.5 mg via ORAL
  Filled 2014-06-12 (×12): qty 1

## 2014-06-12 MED ORDER — SODIUM CHLORIDE 0.9 % IV SOLN
2.0000 mg/h | INTRAVENOUS | Status: DC
  Administered 2014-06-12: 2 mg/h via INTRAVENOUS
  Filled 2014-06-12: qty 2.5

## 2014-06-12 MED ORDER — DIPHENHYDRAMINE HCL 50 MG/ML IJ SOLN
12.5000 mg | Freq: Four times a day (QID) | INTRAMUSCULAR | Status: DC | PRN
  Administered 2014-06-12: 12.5 mg via INTRAVENOUS
  Filled 2014-06-12: qty 1

## 2014-06-12 MED ORDER — POLYETHYLENE GLYCOL 3350 17 G PO PACK
17.0000 g | PACK | ORAL | Status: DC
  Administered 2014-06-13: 17 g via ORAL
  Filled 2014-06-12 (×3): qty 1

## 2014-06-12 MED ORDER — DEXTROSE-NACL 5-0.45 % IV SOLN
INTRAVENOUS | Status: DC
  Administered 2014-06-12: 150 mL/h via INTRAVENOUS
  Administered 2014-06-13: via INTRAVENOUS

## 2014-06-12 NOTE — H&P (Signed)
[facility] History and Physical  Cassie Montgomery QIH:[id number] DOB: [birth year] DOA: 06/11/2014  Referring physician: ED physician PCP: [name]., MD  Specialists:   Chief Complaint: pain all over  HPI: Cassie Montgomery is a 44 y.o. female with past medical history for sickle cell disease-[place], GERD, asthma, depression, who presents with pain all over.  Patient reports that he has been having a worsening pain during last week. Her pain is located mainly in bilateral arms, shoulders, legs and also in the chest. She does not have fever, chills, shortness of breath, cough. She has been taking her medications as prescribed for pain crisis associated with sickle cell disease but without relief. She has a history of acute chest syndrome with similar symptoms per patient. She was seen by the [facility] 3 days ago and spent several hours there with minimal relief. Patient denies cough, SOB, abdominal pain, diarrhea, dysuria, urgency, frequency, hematuria or leg swelling.  Work up in the ED demonstrates negative chest x-ray for acute abnormalities. Temperature normal. Mildly tachycardia. No leukocytosis. Electrolytes normal. Hemoglobin dropped slightly from 10.3 on 06/10/14-8.4 on admission. The patient is admitted to inpatient for further evaluation and treatment.  Review of Systems: As presented in the history of presenting illness, rest negative.  Where does patient live?  At home Can patient participate in ADLs? Yes  Allergy:  Allergies  Allergen Reactions  . Sulfa Antibiotics Other (See Comments)    Increased bilirubin    Past Medical History  Diagnosis Date  . PMDD (premenstrual dysphoric disorder)   . Sickle cell anemia   . Sickle cell disease without crisis 07/15/2011  . Migraine with aura 07/15/2011  . GERD (gastroesophageal reflux disease)   . Asthma in adult     probable  . Depression     Past Surgical History  Procedure Laterality Date  . Cesarean section       2    Social History:  reports that she has never smoked. She does not have any smokeless tobacco history on file. She reports that she does not drink alcohol or use illicit drugs.  Family History:  Family History  Problem Relation Age of Onset  . Hypertension Mother   . Asthma Son   . Asthma Son   . Asthma Daughter      Prior to Admission medications   Medication Sig Start Date End Date Taking? Authorizing Provider  albuterol (PROVENTIL HFA;VENTOLIN HFA) 108 (90 BASE) MCG/ACT inhaler Inhale 2 puffs into the lungs every 6 (six) hours as needed for wheezing or shortness of breath (wheezing).    Yes Historical Provider, MD  busPIRone (BUSPAR) 7.5 MG tablet Take 1 tablet (7.5 mg total) by mouth 3 (three) times daily. 06/08/14  Yes [name], MD  folic acid (FOLVITE) 1 MG tablet Take 1 tablet (1 mg total) by mouth every morning. 05/26/14  Yes [name], MD  ibuprofen (ADVIL,MOTRIN) 200 MG tablet Take 200 mg by mouth every 6 (six) hours as needed for moderate pain.   Yes Historical Provider, MD  LORazepam (ATIVAN) 1 MG tablet Take 1 tablet (1 mg total) by mouth every 8 (eight) hours as needed. for anxiety 05/26/14  Yes [name], MD  morphine (MSIR) 15 MG tablet Take 1.5 tablets (22.5 mg total) by mouth every 4 (four) hours as needed for severe pain. 06/08/14  Yes [name], MD  polyethylene glycol (MIRALAX / GLYCOLAX) packet Take 17 g by mouth 3 (three) times a week.  Yes Historical Provider, MD  fluticasone (FLONASE) 50 MCG/ACT nasal spray Place 2 sprays into both nostrils daily as needed for allergies or rhinitis (allergies).     Historical Provider, MD  methadone (DOLOPHINE) 5 MG tablet Take 1 tablet (5 mg total) by mouth at bedtime. 05/18/14   Dorena Dew, FNP  omeprazole (PRILOSEC) 40 MG capsule Take 40 mg by mouth every evening.    Historical Provider, MD  ondansetron (ZOFRAN) 4 MG tablet Take 1 tablet (4 mg total) by mouth every 8 (eight)  hours as needed for nausea or vomiting. Patient not taking: Reported on 05/26/2014 01/19/14   Dorena Dew, FNP  senna (SENOKOT) 8.6 MG TABS tablet Take 2 tablets by mouth daily as needed for mild constipation.    Historical Provider, MD  sertraline (ZOLOFT) 50 MG tablet TAKE 1 TABLET BY MOUTH EVERY DAY Patient not taking: Reported on 04/14/2014 12/02/13   Dorena Dew, FNP    Physical Exam: Filed Vitals:   06/11/14 2326  BP: 125/74  Pulse: 108  Temp: 98.1 F (36.7 C)  TempSrc: Oral  Resp: 18  SpO2: 99%   General: Not in acute distress HEENT:       Eyes: PERRL, EOMI, no scleral icterus       ENT: No discharge from the ears and nose, no pharynx injection, no tonsillar enlargement.        Neck: No JVD, no bruit, no mass felt. Cardiac: S1/S2, RRR, No murmurs, No gallops or rubs Pulm: Good air movement bilaterally. Clear to auscultation bilaterally. No rales, wheezing, rhonchi or rubs. Abd: Soft, nondistended, nontender, no rebound pain, no organomegaly, BS present Ext: No edema bilaterally. 2+DP/PT pulse bilaterally Musculoskeletal: tenderness all over Skin: No rashes.  Neuro: Alert and oriented X3, cranial nerves II-XII grossly intact, muscle strength 5/5 in all extremeties, sensation to light touch intact.  Psych: Patient is not psychotic, no suicidal or hemocidal ideation.  Labs on Admission:  Basic Metabolic Panel:  Recent Labs Lab 06/09/14 1050 06/12/14 0036  NA 141 137  K 4.1 3.6  CL 108 104  CO2 26 27  GLUCOSE 116* 98  BUN 17 15  CREATININE 0.79 0.68  CALCIUM 9.6 9.1   Liver Function Tests:  Recent Labs Lab 06/09/14 1050 06/12/14 0036  AST 17 17  ALT 11 11  ALKPHOS 64 53  BILITOT 2.8* 2.5*  PROT 7.8 7.1  ALBUMIN 4.3 4.1   No results for input(s): LIPASE, AMYLASE in the last 168 hours. No results for input(s): AMMONIA in the last 168 hours. CBC:  Recent Labs Lab 06/09/14 1050 06/12/14 0036  WBC 7.2 7.9  NEUTROABS 5.2 4.7  HGB 10.3* 8.4*   HCT 30.9* 25.3*  MCV 74.6* 74.0*  PLT 122* 121*   Cardiac Enzymes: No results for input(s): CKTOTAL, CKMB, CKMBINDEX, TROPONINI in the last 168 hours.  BNP (last 3 results) No results for input(s): PROBNP in the last 8760 hours. CBG: No results for input(s): GLUCAP in the last 168 hours.  Radiological Exams on Admission: Dg Chest 2 View  06/12/2014   CLINICAL DATA:  Sickle cell pain crisis, chest pain beginning 30 min prior to arrival in emergency department, worse with inspiration.  EXAM: CHEST  2 VIEW  COMPARISON:  Chest radiograph April 14, 2014  FINDINGS: Cardiomediastinal silhouette is unremarkable. The lungs are clear without pleural effusions or focal consolidations. Trachea projects midline and there is no pneumothorax. Soft tissue planes and included osseous structures are non-suspicious.  IMPRESSION: Normal  chest.   Electronically Signed   By: Elon Alas   On: 06/12/2014 00:03    EKG: Independently reviewed.   Assessment/Plan Principal Problem:   Sickle cell pain crisis Active Problems:   Anemia   Thrombocytopenia   Generalized anxiety disorder   Sickle cell disease, type [place]   Depression, major   GERD (gastroesophageal reflux disease)   Asthma in adult  Sickle cell disease and Sickle cell pain crisis: Patient does not have signs of infection. Chest x-ray is negative for acute symptomatic. No indication for antibiotics. Hemoglobin dropped slightly 10.3-->8.4, does not need transfusion at this moment.  -Admit to tele bed given tachycardia. -Start PCA protocol for pain -Hold transfusion now -continue folic acid -Benadryl for itch -IVF: D5-1/2NS at 150 cc/h -Zofran for nausea -Check LDH and B19 virus IgM and IgG  Depression and anxiety: No suicidal or homicidal ideations. -Continue home medications: Buspirone, Ativan when necessary  Gerd: -Protonix  Asthma: stable, no signs of acute exacerbation. Auscultations clear bilaterally. -Albuterol inhaler  when necessary   DVT ppx: SQ Heparin         Code Status: Full code Family Communication: None at bed side.         Disposition Plan: Admit to inpatient   Date of Service 06/12/2014    Ivor Costa [facility] Pager [phone]  If 7PM-7AM, please contact night-coverage [email or web address] Password [email or web address] 06/12/2014, 5:00 AM

## 2014-06-12 NOTE — Plan of Care (Addendum)
Problem: Phase I Progression Outcomes Goal: Pulmonary Hygiene as Indicated (Sickle Cell) Outcome: Progressing Incentive spirometer- pt reached 1250

## 2014-06-12 NOTE — Progress Notes (Signed)
Patient ID: Cassie Montgomery, female   DOB: [birth year], 44 y.o.   MRN: [id number] SICKLE CELL SERVICE PROGRESS NOTE  Cassie Montgomery MWN:[id number] DOB: 04-May-1971 DOA: 06/11/2014 PCP: MATTHEWS,MICHELLE A., MD   Presenting HPI: Cassie Montgomery is a 44 y.o. female with past medical history for sickle cell disease-[place], GERD, asthma, depression, who presents with pain all over.  Patient reports that he has been having a worsening pain during last week. Her pain is located mainly in bilateral arms, shoulders, legs and also in the chest. She does not have fever, chills, shortness of breath, cough. She has been taking her medications as prescribed for pain crisis associated with sickle cell disease but without relief. She has a history of acute chest syndrome with similar symptoms per patient. [facility] and spent several hours there with minimal relief. Patient denies cough, SOB, abdominal pain, diarrhea, dysuria, urgency, frequency, hematuria or leg swelling.  Work up in the ED demonstrates negative chest x-ray for acute abnormalities. Temperature normal. Mildly tachycardia. No leukocytosis. Electrolytes normal. Hemoglobin dropped slightly from 10.3 on 06/10/14-8.4 on admission. The patient is admitted to inpatient for further evaluation and treatment.  Consultants:  none  Procedures:  none  Antibiotics:  non  HPI/Subjective: Pt states that she has an 8/10 pain in her left arm and shoulder. Her CP and SOB has resolved. She is feeling very itchy and finds it difficult to sleep. She states that she has not improved since last being seen in the clinic. Her current PCA is not helping her pain.   Objective: Filed Vitals:   06/12/14 0850 06/12/14 1125 06/12/14 1250 06/12/14 1305  BP:   141/93 133/85  Pulse:   83 84  Temp:      TempSrc:      Resp: 12 14 12 10   SpO2:  100% 100% 99%   Weight change:   Intake/Output Summary (Last 24 hours) at 06/12/14  1334 Last data filed at 06/12/14 1300  Gross per 24 hour  Intake   1735 ml  Output      0 ml  Net   1735 ml    General: Alert, awake, oriented x3, in no acute distress.  HEENT: Cassie Montgomery/AT PEERL, EOMI OROPHARYNX:  Moist, No exudate/ erythema/lesions.  Heart: Regular rate and rhythm, without murmurs, rubs, gallops. Lungs: Clear to auscultation, no wheezing or rhonchi noted.  Abdomen: Soft, nontender, obese, positive bowel sounds Neuro: No focal neurological deficits noted cranial nerves II through XII grossly intact. Strength 5 out of 5 in bilateral upper and lower extremities. Musculoskeletal: No warm swelling or erythema around joints, no spinal tenderness noted. Psychiatric: Patient alert and oriented x3, good insight and cognition, good recall.    Data Reviewed: Basic Metabolic Panel:  Recent Labs Lab 06/09/14 1050 06/12/14 0036  NA 141 137  K 4.1 3.6  CL 108 104  CO2 26 27  GLUCOSE 116* 98  BUN 17 15  CREATININE 0.79 0.68  CALCIUM 9.6 9.1   Liver Function Tests:  Recent Labs Lab 06/09/14 1050 06/12/14 0036  AST 17 17  ALT 11 11  ALKPHOS 64 53  BILITOT 2.8* 2.5*  PROT 7.8 7.1  ALBUMIN 4.3 4.1   No results for input(s): LIPASE, AMYLASE in the last 168 hours. No results for input(s): AMMONIA in the last 168 hours. CBC:  Recent Labs Lab 06/09/14 1050 06/12/14 0036  WBC 7.2 7.9  NEUTROABS 5.2 4.7  HGB 10.3* 8.4*  HCT 30.9*  25.3*  MCV 74.6* 74.0*  PLT 122* 121*   Cardiac Enzymes: No results for input(s): CKTOTAL, CKMB, CKMBINDEX, TROPONINI in the last 168 hours. BNP (last 3 results) No results for input(s): PROBNP in the last 8760 hours. CBG: No results for input(s): GLUCAP in the last 168 hours.  No results found for this or any previous visit (from the past 240 hour(s)).   Studies: Dg Chest 2 View  06/12/2014   CLINICAL DATA:  Sickle cell pain crisis, chest pain beginning 30 min prior to arrival in emergency department, worse with inspiration.   EXAM: CHEST  2 VIEW  COMPARISON:  Chest radiograph April 14, 2014  FINDINGS: Cardiomediastinal silhouette is unremarkable. The lungs are clear without pleural effusions or focal consolidations. Trachea projects midline and there is no pneumothorax. Soft tissue planes and included osseous structures are non-suspicious.  IMPRESSION: Normal chest.   Electronically Signed   By: Elon Alas   On: 06/12/2014 00:03    Scheduled Meds: . busPIRone  7.5 mg Oral TID  . enoxaparin (LOVENOX) injection  40 mg Subcutaneous Q24H  . folic acid  1 mg Oral q morning - 10a  . HYDROmorphone PCA 2 mg/mL   Intravenous 6 times per day  . ketorolac  30 mg Intravenous Q6H  . methadone  5 mg Oral QHS  . pantoprazole  40 mg Oral Daily  . [START ON 06/13/2014] polyethylene glycol  17 g Oral Once per day on Mon Wed Fri   Continuous Infusions: . dextrose 5 % and 0.45% NaCl 150 mL/hr (06/12/14 0726)    Principal Problem:   Sickle cell pain crisis Active Problems:   Anemia   Thrombocytopenia   Generalized anxiety disorder   Sickle cell disease, type [place]   Depression, major   GERD (gastroesophageal reflux disease)   Asthma in adult   Sickle cell anemia with crisis   Assessment/Plan: Principal Problem:   Sickle cell pain crisis Active Problems:   Anemia   Thrombocytopenia   Generalized anxiety disorder   Sickle cell disease, type [place]   Depression, major   GERD (gastroesophageal reflux disease)   Asthma in adult   Sickle cell anemia with crisis  1)Sickle Cell Crisis: Pt presented with pain consistent with VOC. Pt's pain is an 8/10.  Will start high dose Dilaudid PCA at 0.6 q10 min along with Dilaudid 2mg  q3h PRN IV push.Continue Toradol. Continue Methadone for long acting control.  2) Anemia: Hgb 8.4 from 10.3. LDH not high. Will continue to monitor. Transfuse if Hgb less than 5.5 of symptomatic.  3)Sickle Cell Care: Continue Folic acid  4) Depression: Continue Busiprone. 5) GERD: continue  pantoprazole 6) Asthma: Albuterol PRN  7) FEN/GI : Regular Diet   IV fluids per protocol-D5/0.45% @150cc /hr Bowel regimen in place   Code Status: Full  DVT Prophylaxis: enoxaparin  Family Communication: none  Disposition Plan: pending improvement  Time spent: 25 minutes  Kalman Shan  Pager [phone]. If 7PM-7AM, please contact night-coverage.  06/12/2014, 1:34 PM  LOS: 1 day   Kalman Shan

## 2014-06-12 NOTE — Progress Notes (Signed)
Pt sobbing. States she needs to get better and get home to her 45 y.o. Daughter. Her older son is watching her and she states she's not totally comfortable with that.

## 2014-06-12 NOTE — ED Provider Notes (Signed)
CSN: [id number]     Arrival date & time 06/11/14  2309 History   First MD Initiated Contact with Patient 06/11/14 2339     Chief Complaint  Patient presents with  . Sickle Cell Pain Crisis     (Consider location/radiation/quality/duration/timing/severity/associated sxs/prior Treatment) Patient is a 44 y.o. female presenting with sickle cell pain. The history is provided by the patient. No language interpreter was used.  Sickle Cell Pain Crisis Location:  Chest and upper extremity Severity:  Severe Duration:  5 days Similar to previous crisis episodes: yes   Associated symptoms: chest pain   Associated symptoms: no fever and no nausea   Associated symptoms comment:  Pain in the arms, shoulders for the past week. She has been taking her medications as prescribed for pain crisis associated with sickle cell disease but without relief. She [facility] and spent several hours there without minimal relief. Tonight she started experiencing pain in her chest prompting visit to ED. No fever or SOB. No N, V. She has a history of acute chest syndrome with similar symptoms.   Past Medical History  Diagnosis Date  . PMDD (premenstrual dysphoric disorder)   . Sickle cell anemia   . Sickle cell disease without crisis 07/15/2011  . Migraine with aura 07/15/2011  . GERD (gastroesophageal reflux disease)   . Asthma in adult     probable  . Depression    Past Surgical History  Procedure Laterality Date  . Cesarean section      2   Family History  Problem Relation Age of Onset  . Hypertension Mother   . Asthma Son   . Asthma Son   . Asthma Daughter    History  Substance Use Topics  . Smoking status: Never Smoker   . Smokeless tobacco: Not on file  . Alcohol Use: No   OB History    No data available     Review of Systems  Constitutional: Negative for fever and chills.  HENT: Negative.   Respiratory: Negative.   Cardiovascular: Positive for chest pain.   Gastrointestinal: Negative.  Negative for nausea.  Musculoskeletal: Negative.   Skin: Negative.   Neurological: Negative.       Allergies  Sulfa antibiotics  Home Medications   Prior to Admission medications   Medication Sig Start Date End Date Taking? Authorizing Provider  albuterol (PROVENTIL HFA;VENTOLIN HFA) 108 (90 BASE) MCG/ACT inhaler Inhale 2 puffs into the lungs every 6 (six) hours as needed for wheezing or shortness of breath (wheezing).    Yes Historical Provider, MD  busPIRone (BUSPAR) 7.5 MG tablet Take 1 tablet (7.5 mg total) by mouth 3 (three) times daily. 06/08/14  Yes [name], MD  folic acid (FOLVITE) 1 MG tablet Take 1 tablet (1 mg total) by mouth every morning. 05/26/14  Yes [name], MD  ibuprofen (ADVIL,MOTRIN) 200 MG tablet Take 200 mg by mouth every 6 (six) hours as needed for moderate pain.   Yes Historical Provider, MD  LORazepam (ATIVAN) 1 MG tablet Take 1 tablet (1 mg total) by mouth every 8 (eight) hours as needed. for anxiety 05/26/14  Yes [name], MD  morphine (MSIR) 15 MG tablet Take 1.5 tablets (22.5 mg total) by mouth every 4 (four) hours as needed for severe pain. 06/08/14  Yes [name], MD  polyethylene glycol (MIRALAX / GLYCOLAX) packet Take 17 g by mouth 3 (three) times a week.  Yes Historical Provider, MD  fluticasone (FLONASE) 50 MCG/ACT nasal spray Place 2 sprays into both nostrils daily as needed for allergies or rhinitis (allergies).     Historical Provider, MD  methadone (DOLOPHINE) 5 MG tablet Take 1 tablet (5 mg total) by mouth at bedtime. 05/18/14   Dorena Dew, FNP  omeprazole (PRILOSEC) 40 MG capsule Take 40 mg by mouth every evening.    Historical Provider, MD  ondansetron (ZOFRAN) 4 MG tablet Take 1 tablet (4 mg total) by mouth every 8 (eight) hours as needed for nausea or vomiting. Patient not taking: Reported on 05/26/2014 01/19/14   Dorena Dew, FNP  senna (SENOKOT) 8.6 MG TABS tablet  Take 2 tablets by mouth daily as needed for mild constipation.    Historical Provider, MD  sertraline (ZOLOFT) 50 MG tablet TAKE 1 TABLET BY MOUTH EVERY DAY Patient not taking: Reported on 04/14/2014 12/02/13   Dorena Dew, FNP   BP 125/74 mmHg  Pulse 108  Temp(Src) 98.1 F (36.7 C) (Oral)  Resp 18  SpO2 99%  LMP 05/17/2014 Physical Exam  Constitutional: She is oriented to person, place, and time. She appears well-developed and well-nourished.  HENT:  Head: Normocephalic.  Neck: Normal range of motion. Neck supple.  Cardiovascular: Normal rate and regular rhythm.   Pulmonary/Chest: Effort normal and breath sounds normal.  Abdominal: Soft. Bowel sounds are normal. There is no tenderness. There is no rebound and no guarding.  Musculoskeletal: Normal range of motion.  Neurological: She is alert and oriented to person, place, and time.  Skin: Skin is warm and dry. No rash noted.  Psychiatric: She has a normal mood and affect.    ED Course  Procedures (including critical care time) Labs Review Labs Reviewed  CBC WITH DIFFERENTIAL/PLATELET  RETICULOCYTES  COMPREHENSIVE METABOLIC PANEL    Imaging Review Dg Chest 2 View  06/12/2014   CLINICAL DATA:  Sickle cell pain crisis, chest pain beginning 30 min prior to arrival in emergency department, worse with inspiration.  EXAM: CHEST  2 VIEW  COMPARISON:  Chest radiograph April 14, 2014  FINDINGS: Cardiomediastinal silhouette is unremarkable. The lungs are clear without pleural effusions or focal consolidations. Trachea projects midline and there is no pneumothorax. Soft tissue planes and included osseous structures are non-suspicious.  IMPRESSION: Normal chest.   Electronically Signed   By: Elon Alas   On: 06/12/2014 00:03     EKG Interpretation None      MDM   Final diagnoses:  None   1. Sickle cell crisis  No evidence acute chest syndrome on CXR. She has very difficult to control pain crisis. VSS. Will admit for  further management.      Dewaine Oats, PA-C 06/17/14 0422  Everlene Balls, MD 06/17/14 1355

## 2014-06-13 DIAGNOSIS — D5700 Hb-SS disease with crisis, unspecified: Secondary | ICD-10-CM | POA: Diagnosis not present

## 2014-06-13 LAB — CBC WITH DIFFERENTIAL/PLATELET
Basophils Absolute: 0 10*3/uL (ref 0.0–0.1)
Basophils Relative: 0 % (ref 0–1)
EOS ABS: 0.5 10*3/uL (ref 0.0–0.7)
Eosinophils Relative: 6 % — ABNORMAL HIGH (ref 0–5)
HCT: 25.3 % — ABNORMAL LOW (ref 36.0–46.0)
Hemoglobin: 8.5 g/dL — ABNORMAL LOW (ref 12.0–15.0)
Lymphocytes Relative: 27 % (ref 12–46)
Lymphs Abs: 1.9 10*3/uL (ref 0.7–4.0)
MCH: 25 pg — AB (ref 26.0–34.0)
MCHC: 33.6 g/dL (ref 30.0–36.0)
MCV: 74.4 fL — ABNORMAL LOW (ref 78.0–100.0)
MONOS PCT: 7 % (ref 3–12)
Monocytes Absolute: 0.5 10*3/uL (ref 0.1–1.0)
NEUTROS ABS: 4.2 10*3/uL (ref 1.7–7.7)
Neutrophils Relative %: 60 % (ref 43–77)
Platelets: 105 10*3/uL — ABNORMAL LOW (ref 150–400)
RBC: 3.4 MIL/uL — ABNORMAL LOW (ref 3.87–5.11)
RDW: 17.8 % — AB (ref 11.5–15.5)
WBC: 7.1 10*3/uL (ref 4.0–10.5)

## 2014-06-13 LAB — RETICULOCYTES
RBC.: 3.4 MIL/uL — ABNORMAL LOW (ref 3.87–5.11)
RETIC COUNT ABSOLUTE: 221 10*3/uL — AB (ref 19.0–186.0)
Retic Ct Pct: 6.5 % — ABNORMAL HIGH (ref 0.4–3.1)

## 2014-06-13 NOTE — Progress Notes (Signed)
Patient ID: Cassie Montgomery, female   DOB: 1970/08/24, 44 y.o.   MRN: [id number] SICKLE CELL SERVICE PROGRESS NOTE  Keonta Monceaux EVO:[id number] DOB: 03-26-1971 DOA: 06/11/2014 PCP: MATTHEWS,MICHELLE A., MD   Presenting HPI: Cassie Montgomery is a 44 y.o. female with past medical history for sickle cell disease-[place], GERD, asthma, depression, who presents with pain all over.  Patient reports that he has been having a worsening pain during last week. Her pain is located mainly in bilateral arms, shoulders, legs and also in the chest. She does not have fever, chills, shortness of breath, cough. She has been taking her medications as prescribed for pain crisis associated with sickle cell disease but without relief. She has a history of acute chest syndrome with similar symptoms per patient. She was [facility] ago and spent several hours there with minimal relief. Patient denies cough, SOB, abdominal pain, diarrhea, dysuria, urgency, frequency, hematuria or leg swelling.  Work up in the ED demonstrates negative chest x-ray for acute abnormalities. Temperature normal. Mildly tachycardia. No leukocytosis. Electrolytes normal. Hemoglobin dropped slightly from 10.3 on 06/10/14-8.4 on admission. The patient is admitted to inpatient for further evaluation and treatment.  Consultants:  none  Procedures:  none  Antibiotics:  none  HPI/Subjective: Pt states that her pain has come down to a 4/10. She is feeling very tired but has just gotten to the point where she can sleep. Lost IV overnight and had it replaced late morning.  Objective: Filed Vitals:   06/13/14 0030 06/13/14 0159 06/13/14 0600 06/13/14 1102  BP:  143/88 114/87 124/86  Pulse:  92 85 88  Temp:  97.9 F (36.6 C) 97.3 F (36.3 C)   TempSrc:  Oral Oral   Resp: 10 10 16 12   SpO2: 98% 98% 98%    Weight change:   Intake/Output Summary (Last 24 hours) at 06/13/14 1508 Last data filed at 06/13/14 1102  Gross  per 24 hour  Intake    480 ml  Output      0 ml  Net    480 ml    General: Alert, awake, oriented x3, in no acute distress.  HEENT: [place]/AT PEERL, EOMI OROPHARYNX:  Moist, No exudate/ erythema/lesions.  Heart: Regular rate and rhythm, without murmurs, rubs, gallops. Lungs: Clear to auscultation, no wheezing or rhonchi noted.  Abdomen: Soft, nontender, obese, positive bowel sounds Neuro: No focal neurological deficits noted cranial nerves II through XII grossly intact. Musculoskeletal: No warm swelling or erythema around joints, no spinal tenderness noted.   Data Reviewed: Basic Metabolic Panel:  Recent Labs Lab 06/09/14 1050 06/12/14 0036  NA 141 137  K 4.1 3.6  CL 108 104  CO2 26 27  GLUCOSE 116* 98  BUN 17 15  CREATININE 0.79 0.68  CALCIUM 9.6 9.1   Liver Function Tests:  Recent Labs Lab 06/09/14 1050 06/12/14 0036  AST 17 17  ALT 11 11  ALKPHOS 64 53  BILITOT 2.8* 2.5*  PROT 7.8 7.1  ALBUMIN 4.3 4.1   No results for input(s): LIPASE, AMYLASE in the last 168 hours. No results for input(s): AMMONIA in the last 168 hours. CBC:  Recent Labs Lab 06/09/14 1050 06/12/14 0036 06/13/14 0442  WBC 7.2 7.9 7.1  NEUTROABS 5.2 4.7 4.2  HGB 10.3* 8.4* 8.5*  HCT 30.9* 25.3* 25.3*  MCV 74.6* 74.0* 74.4*  PLT 122* 121* 105*   Cardiac Enzymes: No results for input(s): CKTOTAL, CKMB, CKMBINDEX, TROPONINI in the last 168 hours.  BNP (last 3 results) No results for input(s): PROBNP in the last 8760 hours. CBG: No results for input(s): GLUCAP in the last 168 hours.  No results found for this or any previous visit (from the past 240 hour(s)).   Studies: Dg Chest 2 View  06/12/2014   CLINICAL DATA:  Sickle cell pain crisis, chest pain beginning 30 min prior to arrival in emergency department, worse with inspiration.  EXAM: CHEST  2 VIEW  COMPARISON:  Chest radiograph April 14, 2014  FINDINGS: Cardiomediastinal silhouette is unremarkable. The lungs are clear without  pleural effusions or focal consolidations. Trachea projects midline and there is no pneumothorax. Soft tissue planes and included osseous structures are non-suspicious.  IMPRESSION: Normal chest.   Electronically Signed   By: Elon Alas   On: 06/12/2014 00:03    Scheduled Meds: . busPIRone  7.5 mg Oral TID  . enoxaparin (LOVENOX) injection  40 mg Subcutaneous Q24H  . folic acid  1 mg Oral q morning - 10a  . HYDROmorphone PCA 2 mg/mL   Intravenous 6 times per day  . ketorolac  30 mg Intravenous Q6H  . methadone  5 mg Oral QHS  . pantoprazole  40 mg Oral Daily  . polyethylene glycol  17 g Oral Once per day on Mon Wed Fri   Continuous Infusions:    Principal Problem:   Sickle cell pain crisis Active Problems:   Anemia   Thrombocytopenia   Generalized anxiety disorder   Sickle cell disease, type [place]   Depression, major   GERD (gastroesophageal reflux disease)   Asthma in adult   Sickle cell anemia with crisis   Assessment/Plan: Principal Problem:   Sickle cell pain crisis Active Problems:   Anemia   Thrombocytopenia   Generalized anxiety disorder   Sickle cell disease, type [place]   Depression, major   GERD (gastroesophageal reflux disease)   Asthma in adult   Sickle cell anemia with crisis  1)Sickle Cell Crisis: Pt presented with pain consistent with VOC. Pt's pain is a 4/10 currently. Will Discontinue IV push. Continue Dilaudid PCA at 0.6 q10 min, since IV was re-established this morning. Continue Toradol. Continue Methadone for long acting control.  2) Anemia: Hgb stable at 8.5 today. Previous LDH not elevated. Will continue to monitor. Transfuse if Hgb less than 5.5 of symptomatic.  3)Sickle Cell Care: Continue Folic acid. Given due immunizations. 4) Depression: Continue Busiprone. 5) GERD: continue pantoprazole 6) Asthma: Albuterol PRN  7) FEN/GI : Regular Diet   IV fluids per protocol-KVO Bowel regimen in place   Code Status: Full  DVT Prophylaxis:  enoxaparin  Family Communication: none  Disposition Plan: pending improvement  Time spent: 15 minutes  Kalman Shan  Pager [phone]. If 7PM-7AM, please contact night-coverage.  06/13/2014, 3:08 PM  LOS: 2 days   Kalman Shan

## 2014-06-13 NOTE — Progress Notes (Signed)
High dose concentration Dilaudid PCA 12 ml wasted. Witnessed by Larene Beach, RN

## 2014-06-13 NOTE — Progress Notes (Signed)
Pt had a difficulty time sleeping. Pt complain of noise from [place] pumped. Pt asked if she could remove apparatus. I explain to pt safety policy. Pt understood.   Entered pt room several times and found her to be in bathroom. Upon leaving bathroom pt turned pump IVF off. All apparatus was off. Pumped turned back on and apparatus put back on.  Called by tech pt IV was leaking. Pt attempted to tape IV down. Upon peeling multiple layers off original dressing was stuck and IV could not be saved. It was also explained to pt if she has any issues to call staff for help and not to take it upon herself to "fix it".  It was explained to pt she was a hard stick and she will now not have access to PCA. Pt states "that's ok, maybe they will now begin to wean me off so I can go home."

## 2014-06-13 NOTE — Progress Notes (Signed)
PCA received @ noon:1.8,@1600 -1.2mg . Pt sleeping most the day.

## 2014-06-14 ENCOUNTER — Encounter (HOSPITAL_COMMUNITY): Payer: Self-pay

## 2014-06-14 DIAGNOSIS — K5901 Slow transit constipation: Secondary | ICD-10-CM

## 2014-06-14 LAB — CBC WITH DIFFERENTIAL/PLATELET
BASOS ABS: 0 10*3/uL (ref 0.0–0.1)
Basophils Relative: 0 % (ref 0–1)
EOS PCT: 5 % (ref 0–5)
Eosinophils Absolute: 0.4 10*3/uL (ref 0.0–0.7)
HCT: 25 % — ABNORMAL LOW (ref 36.0–46.0)
HEMOGLOBIN: 8.3 g/dL — AB (ref 12.0–15.0)
LYMPHS PCT: 15 % (ref 12–46)
Lymphs Abs: 1.2 10*3/uL (ref 0.7–4.0)
MCH: 25 pg — ABNORMAL LOW (ref 26.0–34.0)
MCHC: 33.2 g/dL (ref 30.0–36.0)
MCV: 75.3 fL — ABNORMAL LOW (ref 78.0–100.0)
MONO ABS: 0.5 10*3/uL (ref 0.1–1.0)
Monocytes Relative: 6 % (ref 3–12)
Neutro Abs: 6 10*3/uL (ref 1.7–7.7)
Neutrophils Relative %: 75 % (ref 43–77)
Platelets: 108 10*3/uL — ABNORMAL LOW (ref 150–400)
RBC: 3.32 MIL/uL — ABNORMAL LOW (ref 3.87–5.11)
RDW: 18 % — AB (ref 11.5–15.5)
WBC: 8.1 10*3/uL (ref 4.0–10.5)

## 2014-06-14 LAB — COMPREHENSIVE METABOLIC PANEL
ALT: 17 U/L (ref 0–35)
AST: 23 U/L (ref 0–37)
Albumin: 3.5 g/dL (ref 3.5–5.2)
Alkaline Phosphatase: 59 U/L (ref 39–117)
Anion gap: 3 — ABNORMAL LOW (ref 5–15)
BUN: 14 mg/dL (ref 6–23)
CO2: 28 mmol/L (ref 19–32)
CREATININE: 0.84 mg/dL (ref 0.50–1.10)
Calcium: 8.2 mg/dL — ABNORMAL LOW (ref 8.4–10.5)
Chloride: 105 mmol/L (ref 96–112)
GFR calc Af Amer: >90 mL/min (ref >90)
GFR calc non Af Amer: 83 mL/min — ABNORMAL LOW (ref >90)
GLUCOSE: 144 mg/dL — AB (ref 70–99)
Potassium: 3.9 mmol/L (ref 3.5–5.1)
Sodium: 136 mmol/L (ref 135–145)
Total Bilirubin: 3.1 mg/dL — ABNORMAL HIGH (ref 0.3–1.2)
Total Protein: 6.5 g/dL (ref 6.0–8.3)

## 2014-06-14 LAB — GLUCOSE, CAPILLARY
GLUCOSE-CAPILLARY: 94 mg/dL (ref 70–99)
Glucose-Capillary: 114 mg/dL — ABNORMAL HIGH (ref 70–99)

## 2014-06-14 LAB — HEMOGLOBIN AND HEMATOCRIT, BLOOD
HEMATOCRIT: 24.9 % — AB (ref 36.0–46.0)
HEMOGLOBIN: 8.4 g/dL — AB (ref 12.0–15.0)

## 2014-06-14 LAB — PARVOVIRUS B19 ANTIBODY, IGG AND IGM
Parovirus B19 IgG Abs: 0.4 index (ref <0.9)
Parovirus B19 IgM Abs: 0.1 index (ref <0.9)

## 2014-06-14 MED ORDER — MORPHINE SULFATE 15 MG PO TABS
15.0000 mg | ORAL_TABLET | ORAL | Status: DC
  Administered 2014-06-14 – 2014-06-15 (×7): 15 mg via ORAL
  Filled 2014-06-14 (×7): qty 1

## 2014-06-14 MED ORDER — LORAZEPAM 0.5 MG PO TABS
0.5000 mg | ORAL_TABLET | Freq: Once | ORAL | Status: AC
  Administered 2014-06-14: 0.5 mg via ORAL
  Filled 2014-06-14: qty 1

## 2014-06-14 MED ORDER — MAGNESIUM CITRATE PO SOLN
1.0000 | Freq: Once | ORAL | Status: AC
  Administered 2014-06-14: 1 via ORAL
  Filled 2014-06-14: qty 296

## 2014-06-14 NOTE — Care Management Note (Signed)
CARE MANAGEMENT NOTE 06/14/2014  Patient:  Cassie Montgomery, Cassie Montgomery   Account Number:  [id number]  Date Initiated:  06/14/2014  Documentation initiated by:  Marney Doctor  Subjective/Objective Assessment:   44 yo admitted with [facility]     Action/Plan:   From home with family   Anticipated DC Date:  06/16/2014   Anticipated DC Plan:  [place]  CM consult      Choice offered to / List presented to:             Status of service:  In process, will continue to follow Medicare Important Message given?   (If response is "NO", the following Medicare IM given date fields will be blank) Date Medicare IM given:   Medicare IM given by:   Date Additional Medicare IM given:   Additional Medicare IM given by:    Discharge Disposition:    Per UR Regulation:  Reviewed for med. necessity/level of care/duration of stay  If discussed at [place] of Stay Meetings, dates discussed:    Comments:  06/14/14 Marney Doctor RN,BSN,NCM Chart reviewed and CM following for DC needs.

## 2014-06-14 NOTE — Progress Notes (Signed)
SICKLE CELL SERVICE PROGRESS NOTE  Cassie Montgomery BWI:[id number] DOB: 03-23-1971 DOA: 06/11/2014 PCP: Elbony Mcclimans A., MD  Assessment/Plan: Principal Problem:   Sickle cell pain crisis Active Problems:   Anemia   Thrombocytopenia   Generalized anxiety disorder   Sickle cell disease, type [place]   Depression, major   GERD (gastroesophageal reflux disease)   Asthma in adult   Sickle cell anemia with crisis  1. Hb [place] with crisis: Pt still c/o pain in left shoulder. She has used 30.6 mg with 62/51: Demands/Deliveries on PCA in the last 25 hours. I will schedule her MS IR and continue PCA as PRN medication. Continue Toradol. I will reassess this afternoon for possibility of discharge.  2. Constipation: Pt has not had a BM since admission despite Senna-S and Miralax. Will give a dose of Magnesium citrate,.  3. Anemia: Hb stable  4. Chronic pain: Continue Methadone   Code Status: Full Code Family Communication: N/A Disposition Plan: Possibly this afternoon.  Diasha Castleman A.  Pager [phone]. If 7PM-7AM, please contact night-coverage.  06/14/2014, 3:51 PM  LOS: 3 days   Consultants:  None  Procedures:  None  Antibiotics:  None  HPI/Subjective: Pt rates pain as 6/10 and mostly localized to left shoulder. She states that she may be able to manage her pain at home but then expresses doubt about this. I have discussed with patient that this is exactly why there is a need to transition to oral analgesics before making a decision about discharge. Pt has not had a BM since admission.  Objective: Filed Vitals:   06/14/14 0520 06/14/14 0809 06/14/14 1016 06/14/14 1248  BP: 136/73  134/81   Pulse: 124  111   Temp: 98.3 F (36.8 C)  98 F (36.7 C)   TempSrc:   Oral   Resp: 15 15 16 12   Weight:      SpO2: 100%  100% 100%   Weight change:   Intake/Output Summary (Last 24 hours) at 06/14/14 1551 Last data filed at 06/14/14 0605  Gross per 24 hour  Intake     12 ml   Output      0 ml  Net     12 ml    General: Alert, awake, oriented x3, in no acute distress.  HEENT: [place]/AT PEERL, EOMI, anicteric Heart: Regular rate and rhythm, without murmurs, rubs, gallops.  Lungs: Clear to auscultation, no wheezing or rhonchi noted.  Abdomen: Soft, nontender, nondistended, positive bowel sounds, no masses no hepatosplenomegaly noted.  Neuro: No focal neurological deficits noted cranial nerves II through XII grossly intact. Strength normal in bilateral upper and lower extremities. Musculoskeletal: No warm swelling or erythema around joints, no spinal tenderness noted. Psychiatric: Patient alert and oriented x3, good insight and cognition, good recent to remote recall.   Data Reviewed: Basic Metabolic Panel:  Recent Labs Lab 06/09/14 1050 06/12/14 0036 06/14/14 0403  NA 141 137 136  K 4.1 3.6 3.9  CL 108 104 105  CO2 26 27 28   GLUCOSE 116* 98 144*  BUN 17 15 14   CREATININE 0.79 0.68 0.84  CALCIUM 9.6 9.1 8.2*   Liver Function Tests:  Recent Labs Lab 06/09/14 1050 06/12/14 0036 06/14/14 0403  AST 17 17 23   ALT 11 11 17   ALKPHOS 64 53 59  BILITOT 2.8* 2.5* 3.1*  PROT 7.8 7.1 6.5  ALBUMIN 4.3 4.1 3.5   No results for input(s): LIPASE, AMYLASE in the last 168 hours. No results for input(s): AMMONIA in the last 168  hours. CBC:  Recent Labs Lab 06/09/14 1050 06/12/14 0036 06/13/14 0442 06/14/14 0403  WBC 7.2 7.9 7.1 8.1  NEUTROABS 5.2 4.7 4.2 6.0  HGB 10.3* 8.4* 8.5* 8.3*  8.4*  HCT 30.9* 25.3* 25.3* 25.0*  24.9*  MCV 74.6* 74.0* 74.4* 75.3*  PLT 122* 121* 105* 108*    Studies: Dg Chest 2 View  06/12/2014   CLINICAL DATA:  Sickle cell pain crisis, chest pain beginning 30 min prior to arrival in emergency department, worse with inspiration.  EXAM: CHEST  2 VIEW  COMPARISON:  Chest radiograph April 14, 2014  FINDINGS: Cardiomediastinal silhouette is unremarkable. The lungs are clear without pleural effusions or focal consolidations.  Trachea projects midline and there is no pneumothorax. Soft tissue planes and included osseous structures are non-suspicious.  IMPRESSION: Normal chest.   Electronically Signed   By: Elon Alas   On: 06/12/2014 00:03    Scheduled Meds: . busPIRone  7.5 mg Oral TID  . enoxaparin (LOVENOX) injection  40 mg Subcutaneous Q24H  . folic acid  1 mg Oral q morning - 10a  . HYDROmorphone PCA 2 mg/mL   Intravenous 6 times per day  . ketorolac  30 mg Intravenous Q6H  . methadone  5 mg Oral QHS  . morphine  15 mg Oral Q4H  . pantoprazole  40 mg Oral Daily  . polyethylene glycol  17 g Oral Once per day on Mon Wed Fri   Continuous Infusions:   Time spent 40 minutes

## 2014-06-14 NOTE — Progress Notes (Signed)
Patient had a fair night but was noted to be somewhat sedated and disoriented. Observed talking to herself and spinning IV pole around while in bed. Will converse but eyes remain close and speech slow.

## 2014-06-15 DIAGNOSIS — D696 Thrombocytopenia, unspecified: Secondary | ICD-10-CM

## 2014-06-15 DIAGNOSIS — K5909 Other constipation: Secondary | ICD-10-CM

## 2014-06-15 DIAGNOSIS — T50905A Adverse effect of unspecified drugs, medicaments and biological substances, initial encounter: Secondary | ICD-10-CM

## 2014-06-15 LAB — BASIC METABOLIC PANEL
Anion gap: 5 (ref 5–15)
BUN: 16 mg/dL (ref 6–23)
CO2: 30 mmol/L (ref 19–32)
Calcium: 8.7 mg/dL (ref 8.4–10.5)
Chloride: 104 mmol/L (ref 96–112)
Creatinine, Ser: 0.86 mg/dL (ref 0.50–1.10)
GFR calc Af Amer: >90 mL/min (ref >90)
GFR, EST NON AFRICAN AMERICAN: 81 mL/min — AB (ref >90)
Glucose, Bld: 122 mg/dL — ABNORMAL HIGH (ref 70–99)
Potassium: 4.1 mmol/L (ref 3.5–5.1)
SODIUM: 139 mmol/L (ref 135–145)

## 2014-06-15 MED ORDER — MORPHINE SULFATE 15 MG PO TABS: 22.5000 mg | ORAL_TABLET | ORAL | Status: DC | PRN

## 2014-06-15 MED ORDER — HYDROMORPHONE HCL 4 MG PO TABS: 4.0000 mg | ORAL_TABLET | ORAL | Status: DC | PRN

## 2014-06-15 NOTE — Discharge Summary (Signed)
Cassie Montgomery MRN: [id number] DOB/AGE: [birth year] 44 y.o.  Admit date: 06/11/2014 Discharge date: 06/15/2014  Primary Care Physician:  Gene Glazebrook A., MD   Discharge Diagnoses:   Patient Active Problem List   Diagnosis Date Noted  . Sickle cell pain crisis 06/12/2014  . GERD (gastroesophageal reflux disease) 06/12/2014  . Sickle cell anemia with crisis 06/12/2014  . Asthma in adult   . S/P cholecystectomy 06/11/2014  . Cholecystitis, acute 06/10/2014  . Chronic prescription opiate use 01/12/2014  . Depression, major 10/07/2013  . PTSD (post-traumatic stress disorder) 10/07/2013  . Medication side effects 10/07/2013  . Unspecified constipation 09/27/2013  . Weight gain 09/27/2013  . Irregular menstrual cycle 05/12/2013  . Panic anxiety syndrome 05/11/2013  . Insomnia 05/11/2013  . Pap smear for cervical cancer screening 05/11/2013  . Sickle cell disease, type [place] 02/15/2013  . Chronic pain syndrome 01/06/2013  . Depressive disorder, not elsewhere classified 10/20/2012  . Generalized anxiety disorder 10/20/2012  . Opiate analgesic contract exists 09/11/2012  . Anemia 12/07/2011  . Leg pain 12/07/2011  . Thrombocytopenia 12/07/2011    DISCHARGE MEDICATION:   Medication List    TAKE these medications        albuterol 108 (90 BASE) MCG/ACT inhaler  Commonly known as:  PROVENTIL HFA;VENTOLIN HFA  Inhale 2 puffs into the lungs every 6 (six) hours as needed for wheezing or shortness of breath (wheezing).     busPIRone 7.5 MG tablet  Commonly known as:  BUSPAR  Take 1 tablet (7.5 mg total) by mouth 3 (three) times daily.     fluticasone 50 MCG/ACT nasal spray  Commonly known as:  FLONASE  Place 2 sprays into both nostrils daily as needed for allergies or rhinitis (allergies).     folic acid 1 MG tablet  Commonly known as:  FOLVITE  Take 1 tablet (1 mg total) by mouth every morning.     HYDROmorphone 4 MG tablet  Commonly known as:  DILAUDID  Take 1 tablet (4 mg  total) by mouth every 4 (four) hours as needed for severe pain.     ibuprofen 200 MG tablet  Commonly known as:  ADVIL,MOTRIN  Take 200 mg by mouth every 6 (six) hours as needed for moderate pain.     LORazepam 1 MG tablet  Commonly known as:  ATIVAN  Take 1 tablet (1 mg total) by mouth every 8 (eight) hours as needed. for anxiety     methadone 5 MG tablet  Commonly known as:  DOLOPHINE  Take 1 tablet (5 mg total) by mouth at bedtime.     morphine 15 MG tablet  Commonly known as:  MSIR  Take 1.5 tablets (22.5 mg total) by mouth every 4 (four) hours as needed for severe pain. Resume after course of Dilaudid completed.     omeprazole 40 MG capsule  Commonly known as:  PRILOSEC  Take 40 mg by mouth every evening.        polyethylene glycol packet  Commonly known as:  MIRALAX / GLYCOLAX  Take 17 g by mouth 3 (three) times a week.     senna 8.6 MG Tabs tablet  Commonly known as:  SENOKOT  Take 2 tablets by mouth daily as needed for mild constipation.            SIGNIFICANT DIAGNOSTIC STUDIES:  Dg Chest 2 View  06/12/2014   CLINICAL DATA:  Sickle cell pain crisis, chest pain beginning 30 min prior to arrival in emergency department, worse with  inspiration.  EXAM: CHEST  2 VIEW  COMPARISON:  Chest radiograph April 14, 2014  FINDINGS: Cardiomediastinal silhouette is unremarkable. The lungs are clear without pleural effusions or focal consolidations. Trachea projects midline and there is no pneumothorax. Soft tissue planes and included osseous structures are non-suspicious.  IMPRESSION: Normal chest.   Electronically Signed   By: Elon Alas   On: 06/12/2014 00:03       No results found for this or any previous visit (from the past 240 hour(s)).  BRIEF ADMITTING H & P: Cassie Montgomery is a 44 y.o. female with past medical history for sickle cell disease-[place], GERD, asthma, depression, who presents with pain all over.  Patient reports that he has been having a worsening  pain during last week. Her pain is located mainly in bilateral arms, shoulders, legs and also in the chest. She does not have fever, chills, shortness of breath, cough. She has been taking her medications as prescribed for pain crisis associated with sickle cell disease but without relief. She has a history of acute chest syndrome with similar symptoms per patient. She was [facility] and spent several hours there with minimal relief. Patient denies cough, SOB, abdominal pain, diarrhea, dysuria, urgency, frequency, hematuria or leg swelling.  Work up in the ED demonstrates negative chest x-ray for acute abnormalities. Temperature normal. Mildly tachycardia. No leukocytosis. Electrolytes normal. Hemoglobin dropped slightly from 10.3 on 06/10/14-8.4 on admission. The patient is admitted to inpatient for further evaluation and treatment.   Hospital Course:  Present on Admission:  . Sickle cell disease, type  with crisis: Pt was managed with Dilaudid PCA and then transitioned to oral analgesics. Pt still required a significant amount of IV Dilaudid for control of pain. In light of this, she is being discharged on a short of course of oral Dilaudid 4 mg every 4 hours as needed for pain then she should resume her usual medication of MS IR 30 mg q 4 hours PRN pain. Also continue Methadone as scheduled.   . Constipation: Pt had constipation likely secondary to increased opiate use. She was treated with senna-s and Miralax which was ineffective. She was then givena dose of Magnesium Citrate with good results. Pt advised to take bene fiber and senna-S on a daily basis.  . Urinary retention: Pt was reported as having difficulty voiding. However she recovered her ability to void without intervention and had no accompanying symptoms.  . Thrombocytopenia; Pt has chronic thrombocytopenia which is monitored on a chronic basis as an out patient. She has been referred to Hematology but has  not follow ed through with the referral. She has no active bleeding.  . Generalized anxiety disorder: On Buspar.  . Anemia: Hb stable  . GERD (gastroesophageal reflux disease): Continue omeprazole     Disposition and Follow-up:  Pt is discharged in stable condition and is to follow up as needed.      Discharge Instructions    Activity as tolerated - No restrictions    Complete by:  As directed      Diet general    Complete by:  As directed            DISCHARGE EXAM:  General: Alert, awake, oriented x3, in mild distress.  Vital Signs: BP 109/76, HR 106, T 98.2 F (36.8 C), temperature source Oral, RR 14, weight 238 lb 1.6 oz (108.001 kg), last menstrual period 05/17/2014, SpO2 100 %. HEENT: [place]/AT PEERL, EOMI, anicteric  Neck: Trachea midline, no masses, no thyromegal,y no JVD, no carotid bruit OROPHARYNX: Moist, No exudate/ erythema/lesions.  Heart: Regular rate and rhythm, without murmurs, rubs, gallops, PMI non-displaced.  Lungs: Clear to auscultation, no wheezing or rhonchi noted.  Abdomen: Soft, nontender, nondistended, positive bowel sounds, no masses no hepatosplenomegaly noted.  Neuro: No focal neurological deficits noted cranial nerves II through XII grossly intact.  Strength at baseline in bilateral upper and lower extremities. Musculoskeletal: No warm swelling or erythema around joints, no spinal tenderness noted. Psychiatric: Patient alert and oriented x3, good insight and cognition, good recent to remote recall. Lymph node survey: No cervical axillary or inguinal lymphadenopathy noted.    Recent Labs  06/14/14 0403 06/15/14 0700  NA 136 139  K 3.9 4.1  CL 105 104  CO2 28 30  GLUCOSE 144* 122*  BUN 14 16  CREATININE 0.84 0.86  CALCIUM 8.2* 8.7    Recent Labs  06/14/14 0403  AST 23  ALT 17  ALKPHOS 59  BILITOT 3.1*  PROT 6.5  ALBUMIN 3.5   No results for input(s): LIPASE, AMYLASE in the last 72 hours.  Recent Labs  06/13/14 0442  06/14/14 0403  WBC 7.1 8.1  NEUTROABS 4.2 6.0  HGB 8.5* 8.3*  8.4*  HCT 25.3* 25.0*  24.9*  MCV 74.4* 75.3*  PLT 105* 108*   Total time spent including face to face and decision making was greater than 30 minutes.  Signed: Burnell Matlin A. 06/15/2014, 10:20 AM

## 2014-06-15 NOTE — Progress Notes (Signed)
Pt states when she attempts to void, she is unable to void.  Pt states she still feels "pressure" when trying to void but is unable to void at the time.  Pt denies any burning while attempting to urinate. Will bladder scan pt to check for retention.

## 2014-06-15 NOTE — Progress Notes (Signed)
Wasted 7 ml of dilaudid 2mg /ml with Cline Crock RN

## 2014-06-15 NOTE — Care Management Note (Signed)
Medicare Important Message given?  YES (If response is "NO", the following Medicare IM given date fields will be blank) Date Medicare IM given:  06/15/2014 Medicare IM given by:  Marney Doctor Date Additional Medicare IM given:   Additional Medicare IM given by:

## 2014-06-22 ENCOUNTER — Telehealth: Payer: Self-pay | Admitting: Internal Medicine

## 2014-06-22 DIAGNOSIS — D5720 Sickle-cell/Hb-C disease without crisis: Secondary | ICD-10-CM

## 2014-06-22 DIAGNOSIS — G894 Chronic pain syndrome: Secondary | ICD-10-CM

## 2014-06-22 NOTE — Telephone Encounter (Signed)
Refill request for Methadone 5mg  LOV 06/06/2014. Please advise. Thanks!

## 2014-06-23 MED ORDER — METHADONE HCL 5 MG PO TABS: 5.0000 mg | ORAL_TABLET | Freq: Every day | ORAL | Status: DC

## 2014-06-23 NOTE — Telephone Encounter (Signed)
Prescription written for Methadone 5 mg #30. [facility] reviewed and no inconsistencies noted.

## 2014-06-27 ENCOUNTER — Ambulatory Visit (INDEPENDENT_AMBULATORY_CARE_PROVIDER_SITE_OTHER): Payer: Medicare Other | Admitting: Family Medicine

## 2014-06-27 VITALS — BP 117/74 | HR 94 | Temp 98.2°F | Resp 16 | Ht 67.0 in | Wt 231.0 lb

## 2014-06-27 DIAGNOSIS — G894 Chronic pain syndrome: Secondary | ICD-10-CM

## 2014-06-27 DIAGNOSIS — E559 Vitamin D deficiency, unspecified: Secondary | ICD-10-CM | POA: Diagnosis not present

## 2014-06-27 DIAGNOSIS — D5720 Sickle-cell/Hb-C disease without crisis: Secondary | ICD-10-CM | POA: Diagnosis not present

## 2014-06-27 MED ORDER — MORPHINE SULFATE 15 MG PO TABS: 22.5000 mg | ORAL_TABLET | ORAL | Status: DC | PRN

## 2014-06-27 MED ORDER — IBUPROFEN 600 MG PO TABS: 600.0000 mg | ORAL_TABLET | Freq: Three times a day (TID) | ORAL | Status: DC | PRN

## 2014-06-27 NOTE — Patient Instructions (Signed)
Sickle Cell Anemia, Adult °Sickle cell anemia is a condition in which red blood cells have an abnormal "sickle" shape. This abnormal shape shortens the cells' life span, which results in a lower than normal concentration of red blood cells in the blood. The sickle shape also causes the cells to clump together and block free blood flow through the blood vessels. As a result, the tissues and organs of the body do not receive enough oxygen. Sickle cell anemia causes organ damage and pain and increases the risk of infection. °CAUSES  °Sickle cell anemia is a genetic disorder. Those who receive two copies of the gene have the condition, and those who receive one copy have the trait. °RISK FACTORS °The sickle cell gene is most common in people whose families originated in [place]. Other areas of the globe where sickle cell trait occurs include the Mediterranean, [place] and [place], the [place], and the [place].  °SIGNS AND SYMPTOMS °· Pain, especially in the extremities, back, chest, or abdomen (common). The pain may start suddenly or may develop following an illness, especially if there is dehydration. Pain can also occur due to overexertion or exposure to extreme temperature changes. °· Frequent severe bacterial infections, especially certain types of pneumonia and meningitis. °· Pain and swelling in the hands and feet. °· Decreased activity.   °· Loss of appetite.   °· Change in behavior. °· Headaches. °· Seizures. °· Shortness of breath or difficulty breathing. °· Vision changes. °· Skin ulcers. °Those with the trait may not have symptoms or they may have mild symptoms.  °DIAGNOSIS  °Sickle cell anemia is diagnosed with blood tests that demonstrate the genetic trait. It is often diagnosed during the newborn period, due to mandatory testing nationwide. A variety of blood tests, X-rays, CT scans, MRI scans, ultrasounds, and lung function tests may also be done to monitor the condition. °TREATMENT  °Sickle  cell anemia may be treated with: °· Medicines. You may be given pain medicines, antibiotic medicines (to treat and prevent infections) or medicines to increase the production of certain types of hemoglobin. °· Fluids. °· Oxygen. °· Blood transfusions. °HOME CARE INSTRUCTIONS  °· Drink enough fluid to keep your urine clear or pale yellow. Increase your fluid intake in hot weather and during exercise. °· Do not smoke. Smoking lowers oxygen levels in the blood.   °· Only take over-the-counter or prescription medicines for pain, fever, or discomfort as directed by your health care provider. °· Take antibiotics as directed by your health care provider. Make sure you finish them it even if you start to feel better.   °· Take supplements as directed by your health care provider.   °· Consider wearing a medical alert bracelet. This tells anyone caring for you in an emergency of your condition.   °· When traveling, keep your medical information, health care provider's names, and the medicines you take with you at all times.   °· If you develop a fever, do not take medicines to reduce the fever right away. This could cover up a problem that is developing. Notify your health care provider. °· Keep all follow-up appointments with your health care provider. Sickle cell anemia requires regular medical care. °SEEK MEDICAL CARE IF: ° You have a fever. °SEEK IMMEDIATE MEDICAL CARE IF:  °· You feel dizzy or faint.   °· You have new abdominal pain, especially on the left side near the stomach area.   °· You develop a persistent, often uncomfortable and painful penile erection (priapism). If this is not treated immediately it   will lead to impotence.   °· You have numbness your arms or legs or you have a hard time moving them.   °· You have a hard time with speech.   °· You have a fever or persistent symptoms for more than 2-3 days.   °· You have a fever and your symptoms suddenly get worse.   °· You have signs or symptoms of infection.  These include:   °¨ Chills.   °¨ Abnormal tiredness (lethargy).   °¨ Irritability.   °¨ Poor eating.   °¨ Vomiting.   °· You develop pain that is not helped with medicine.   °· You develop shortness of breath. °· You have pain in your chest.   °· You are coughing up pus-like or bloody sputum.   °· You develop a stiff neck. °· Your feet or hands swell or have pain. °· Your abdomen appears bloated. °· You develop joint pain. °MAKE SURE YOU: °· Understand these instructions. °Document Released: 08/07/2005 Document Revised: 09/13/2013 Document Reviewed: 12/09/2012 °[employer]® Patient Information ©2015 [employer]. This information is not intended to replace advice given to you by your health care provider. Make sure you discuss any questions you have with your health care provider. ° °

## 2014-06-27 NOTE — Progress Notes (Signed)
Subjective:    Patient ID: Cassie Montgomery, female    DOB: 04/06/1971, 44 y.o.   MRN: [id number]  HPI  [name], a patient with sickle cell anemia, Hb [place] presents for sickle cell pain. She states that she is currently having 4/10 pain in the left arm described at intermittent and throbbing. She states that he last had pain medication around 11 am with moderate relief. She denies fatigue, weakness, shortness of breath, chest pain,  nausea, vomiting, and diarrhea.   Past Medical History  Diagnosis Date  . PMDD (premenstrual dysphoric disorder)   . Sickle cell anemia   . Sickle cell disease without crisis 07/15/2011  . Migraine with aura 07/15/2011  . GERD (gastroesophageal reflux disease)   . Asthma in adult     probable  . Depression    History   Social History Narrative   Uses a cane occasionally.  Does not work.     Allergies  Allergen Reactions  . Sulfa Antibiotics Other (See Comments)    Increased bilirubin   Review of Systems  Constitutional: Negative.   HENT: Negative.   Eyes: Negative.   Respiratory: Negative.   Cardiovascular: Negative.   Gastrointestinal: Negative.   Endocrine: Negative.   Genitourinary: Negative.   Musculoskeletal: Positive for myalgias (left arm pain).  Skin: Negative.   Allergic/Immunologic: Negative.   Neurological: Negative.   Hematological: Negative.   Psychiatric/Behavioral: Negative.        Objective:   Physical Exam  Constitutional: She is oriented to person, place, and time. She appears well-developed and well-nourished.  HENT:  Head: Normocephalic.  Right Ear: External ear normal.  Left Ear: External ear normal.  Mouth/Throat: Oropharynx is clear and moist.  Neck: Normal range of motion. Neck supple.  Cardiovascular: Normal rate, regular rhythm, normal heart sounds and intact distal pulses.   Pulmonary/Chest: Effort normal and breath sounds normal.  Abdominal: Soft. Bowel sounds are normal.  Musculoskeletal:  Normal range of motion.  Neurological: She is alert and oriented to person, place, and time. She has normal reflexes.  Skin: Skin is warm and dry.  Psychiatric: She has a normal mood and affect. Her behavior is normal. Judgment and thought content normal.      BP 117/74 mmHg  Pulse 94  Temp(Src) 98.2 F (36.8 C) (Oral)  Resp 16  Ht 5\' 7"  (1.702 m)  Wt 231 lb (104.781 kg)  BMI 36.17 kg/m2  LMP 05/30/2014    Assessment & Plan:   1. Sickle cell disease, type [place], without crisis Sickle cell disease - The patient was reminded of the need to seek medical attention of any symptoms of bleeding, anemia, or infection. Continue folic acid 1 mg daily to prevent aplastic bone marrow crises. Patient is not currently taking an anti-inflammatory for sickle cell anemia. I reviewed previous laboratory results and I will add a trial of Ibuprofen to pain medication regimen. Discussed the side effects of Ibuprofen with patient at length.   Pulmonary evaluation - Patient denies severe recurrent wheezes, shortness of breath with exercise, or persistent cough. If these symptoms develop, pulmonary function tests with spirometry will be ordered, and if abnormal, plan on referral to Pulmonology for further evaluation.  Cardiac - Routine screening for pulmonary hypertension is not recommended.  Eye - High risk of proliferative retinopathy. Annual eye exam with retinal exam recommended to patient. Sent referral for eye exam  Immunization status -  UTD with immunizations.  Acute and chronic painful episodes - We agreed  on a plan of titrating her Medication dose. We discussed that she is to receive her Schedule II prescriptions only from [place]. She is also aware that her prescription history is available to [place] online through the [place]. We reminded Ms. Wirsing that all patients receiving Schedule II narcotics must be seen for follow up every three months. We reviewed the terms of our pain agreement, including the need  to keep medicines in a safe locked location away from children or pets, and the need to report excess sedation or constipation, measures to avoid constipation, and policies related to early refills and stolen prescriptions. According to the [place] Chronic [facility] program, we have reviewed details related to analgesia, adverse effects, aberrant behaviors.   Iron overload from chronic transfusion.    Vitamin D deficiency -Reviewed previous vitamin D level, patient has vitamin d deficiency. Drisdol 50,0000 units daily   - ibuprofen (ADVIL,MOTRIN) 600 MG tablet; Take 1 tablet (600 mg total) by mouth every 8 (eight) hours as needed for moderate pain.  Dispense: 30 tablet; Refill: 1 - morphine (MSIR) 15 MG tablet; Take 1.5 tablets (22.5 mg total) by mouth every 4 (four) hours as needed for severe pain. Resume after course of Dilaudid completed.  Dispense: 135 tablet; Refill: 0 - CBC with Differential; Future - COMPLETE METABOLIC PANEL WITH GFR; Future - Ambulatory referral to Ophthalmology -Reviewed [place] Substance Reporting system prior to reorder   2. Chronic pain syndrome Patient states that she continues to have increased pain on MSIR 15 mg every 4 hours for chronic pain. Her pain intensity is 4/10, which is typically her baseline. Calculated morphine equivalence. MED= 155. Patient is requesting to add additional medication for pain management. I informed Ms. Ihrig that I will not increase her pain medications and can refer her to pain management to discuss increasing pain medication.    3. Vitamin D deficiency Reviewed previous vitamin D level, which was 15, I will start weekly vitamin D replacement therapy.    ergocalciferol (VITAMIN D2) 50000 UNITS capsule; Take 1 capsule (50,000 Units total) by mouth once a week.  Dispense: 4 capsule; Refill: 3    The above recommendations are taken from the NIH Evidence-Based Management of Sickle Cell Disease: Expert Panel Report, 20149.     Dorena Dew, FNP

## 2014-06-28 ENCOUNTER — Encounter: Payer: Self-pay | Admitting: Family Medicine

## 2014-06-28 DIAGNOSIS — E559 Vitamin D deficiency, unspecified: Secondary | ICD-10-CM | POA: Insufficient documentation

## 2014-06-28 MED ORDER — ERGOCALCIFEROL 1.25 MG (50000 UT) PO CAPS: 50000.0000 [IU] | ORAL_CAPSULE | ORAL | Status: DC

## 2014-07-07 ENCOUNTER — Telehealth: Payer: Self-pay | Admitting: Internal Medicine

## 2014-07-07 DIAGNOSIS — D5720 Sickle-cell/Hb-C disease without crisis: Secondary | ICD-10-CM

## 2014-07-07 MED ORDER — MORPHINE SULFATE 15 MG PO TABS: 22.5000 mg | ORAL_TABLET | ORAL | Status: DC | PRN

## 2014-07-07 NOTE — Telephone Encounter (Signed)
Refill request for morphine 15mg . LOV 06/27/2014. Please advise. Thanks!

## 2014-07-07 NOTE — Telephone Encounter (Signed)
Meds ordered this encounter  Medications  . morphine (MSIR) 15 MG tablet    Sig: Take 1.5 tablets (22.5 mg total) by mouth every 4 (four) hours as needed for severe pain.    Dispense:  135 tablet    Refill:  0    Order Specific Question:  Supervising Provider    Answer:  Liston Alba A [[id number]]  Reviewed [place] Substance Reporting system prior to reorder   Dorena Dew, FNP

## 2014-07-08 ENCOUNTER — Emergency Department (HOSPITAL_COMMUNITY): Payer: Medicare Other

## 2014-07-08 ENCOUNTER — Emergency Department (HOSPITAL_COMMUNITY)
Admission: EM | Admit: 2014-07-08 | Discharge: 2014-07-08 | Disposition: A | Discharge status: Home / Self Care | Payer: Medicare Other | Attending: Emergency Medicine | Admitting: Emergency Medicine

## 2014-07-08 ENCOUNTER — Encounter (HOSPITAL_COMMUNITY): Payer: Self-pay | Admitting: Emergency Medicine

## 2014-07-08 DIAGNOSIS — Z3202 Encounter for pregnancy test, result negative: Secondary | ICD-10-CM | POA: Diagnosis not present

## 2014-07-08 DIAGNOSIS — Z8742 Personal history of other diseases of the female genital tract: Secondary | ICD-10-CM | POA: Diagnosis not present

## 2014-07-08 DIAGNOSIS — Z79899 Other long term (current) drug therapy: Secondary | ICD-10-CM | POA: Insufficient documentation

## 2014-07-08 DIAGNOSIS — R197 Diarrhea, unspecified: Secondary | ICD-10-CM | POA: Insufficient documentation

## 2014-07-08 DIAGNOSIS — J45909 Unspecified asthma, uncomplicated: Secondary | ICD-10-CM | POA: Insufficient documentation

## 2014-07-08 DIAGNOSIS — Z8719 Personal history of other diseases of the digestive system: Secondary | ICD-10-CM | POA: Diagnosis not present

## 2014-07-08 DIAGNOSIS — D57812 Other sickle-cell disorders with splenic sequestration: Secondary | ICD-10-CM | POA: Diagnosis not present

## 2014-07-08 DIAGNOSIS — R112 Nausea with vomiting, unspecified: Secondary | ICD-10-CM | POA: Diagnosis not present

## 2014-07-08 DIAGNOSIS — R52 Pain, unspecified: Secondary | ICD-10-CM | POA: Diagnosis not present

## 2014-07-08 DIAGNOSIS — D571 Sickle-cell disease without crisis: Secondary | ICD-10-CM | POA: Insufficient documentation

## 2014-07-08 DIAGNOSIS — G8929 Other chronic pain: Secondary | ICD-10-CM | POA: Diagnosis not present

## 2014-07-08 DIAGNOSIS — Z8679 Personal history of other diseases of the circulatory system: Secondary | ICD-10-CM | POA: Insufficient documentation

## 2014-07-08 DIAGNOSIS — M79606 Pain in leg, unspecified: Secondary | ICD-10-CM | POA: Diagnosis not present

## 2014-07-08 DIAGNOSIS — F419 Anxiety disorder, unspecified: Secondary | ICD-10-CM | POA: Diagnosis not present

## 2014-07-08 DIAGNOSIS — G894 Chronic pain syndrome: Secondary | ICD-10-CM | POA: Diagnosis not present

## 2014-07-08 HISTORY — DX: Anxiety disorder, unspecified: F41.9

## 2014-07-08 HISTORY — DX: Pain in left arm: M79.602

## 2014-07-08 HISTORY — DX: Chronic pain syndrome: G89.4

## 2014-07-08 LAB — URINALYSIS, ROUTINE W REFLEX MICROSCOPIC
Bilirubin Urine: NEGATIVE
GLUCOSE, UA: NEGATIVE mg/dL
Hgb urine dipstick: NEGATIVE
KETONES UR: NEGATIVE mg/dL
Leukocytes, UA: NEGATIVE
Nitrite: NEGATIVE
PH: 7 (ref 5.0–8.0)
PROTEIN: 100 mg/dL — AB
Specific Gravity, Urine: 1.013 (ref 1.005–1.030)
UROBILINOGEN UA: 1 mg/dL (ref 0.0–1.0)

## 2014-07-08 LAB — COMPREHENSIVE METABOLIC PANEL
ALBUMIN: 4.5 g/dL (ref 3.5–5.2)
ALT: 12 U/L (ref 0–35)
ANION GAP: 7 (ref 5–15)
AST: 18 U/L (ref 0–37)
Alkaline Phosphatase: 57 U/L (ref 39–117)
BUN: 16 mg/dL (ref 6–23)
CHLORIDE: 105 mmol/L (ref 96–112)
CO2: 24 mmol/L (ref 19–32)
Calcium: 8.8 mg/dL (ref 8.4–10.5)
Creatinine, Ser: 0.77 mg/dL (ref 0.50–1.10)
GFR calc non Af Amer: >90 mL/min (ref >90)
Glucose, Bld: 160 mg/dL — ABNORMAL HIGH (ref 70–99)
POTASSIUM: 3.8 mmol/L (ref 3.5–5.1)
Sodium: 136 mmol/L (ref 135–145)
Total Bilirubin: 2.3 mg/dL — ABNORMAL HIGH (ref 0.3–1.2)
Total Protein: 8.2 g/dL (ref 6.0–8.3)

## 2014-07-08 LAB — CBC WITH DIFFERENTIAL/PLATELET
BASOS PCT: 0 % (ref 0–1)
Basophils Absolute: 0 10*3/uL (ref 0.0–0.1)
Eosinophils Absolute: 0 10*3/uL (ref 0.0–0.7)
Eosinophils Relative: 0 % (ref 0–5)
HEMATOCRIT: 33.4 % — AB (ref 36.0–46.0)
HEMOGLOBIN: 11.3 g/dL — AB (ref 12.0–15.0)
Lymphocytes Relative: 5 % — ABNORMAL LOW (ref 12–46)
Lymphs Abs: 0.6 10*3/uL — ABNORMAL LOW (ref 0.7–4.0)
MCH: 24.9 pg — ABNORMAL LOW (ref 26.0–34.0)
MCHC: 33.8 g/dL (ref 30.0–36.0)
MCV: 73.6 fL — AB (ref 78.0–100.0)
MONOS PCT: 4 % (ref 3–12)
Monocytes Absolute: 0.4 10*3/uL (ref 0.1–1.0)
Neutro Abs: 10.6 10*3/uL — ABNORMAL HIGH (ref 1.7–7.7)
Neutrophils Relative %: 91 % — ABNORMAL HIGH (ref 43–77)
Platelets: 120 10*3/uL — ABNORMAL LOW (ref 150–400)
RBC: 4.54 MIL/uL (ref 3.87–5.11)
RDW: 18.4 % — ABNORMAL HIGH (ref 11.5–15.5)
WBC: 11.7 10*3/uL — ABNORMAL HIGH (ref 4.0–10.5)

## 2014-07-08 LAB — URINE MICROSCOPIC-ADD ON: Urine-Other: NONE SEEN

## 2014-07-08 LAB — LIPASE, BLOOD: Lipase: 13 U/L (ref 11–59)

## 2014-07-08 LAB — PREGNANCY, URINE: Preg Test, Ur: NEGATIVE

## 2014-07-08 MED ORDER — DICYCLOMINE HCL 10 MG/ML IM SOLN
20.0000 mg | Freq: Once | INTRAMUSCULAR | Status: AC
  Administered 2014-07-08: 20 mg via INTRAMUSCULAR
  Filled 2014-07-08: qty 2

## 2014-07-08 MED ORDER — ONDANSETRON HCL 4 MG/2ML IJ SOLN
4.0000 mg | INTRAMUSCULAR | Status: DC | PRN
  Administered 2014-07-08: 4 mg via INTRAVENOUS
  Filled 2014-07-08: qty 2

## 2014-07-08 MED ORDER — ONDANSETRON HCL 4 MG PO TABS: 4.0000 mg | ORAL_TABLET | Freq: Three times a day (TID) | ORAL | Status: DC | PRN

## 2014-07-08 MED ORDER — ONDANSETRON HCL 4 MG/2ML IJ SOLN
4.0000 mg | Freq: Once | INTRAMUSCULAR | Status: AC
  Administered 2014-07-08: 4 mg via INTRAVENOUS
  Filled 2014-07-08: qty 2

## 2014-07-08 MED ORDER — HYDROMORPHONE HCL 1 MG/ML IJ SOLN
1.0000 mg | INTRAMUSCULAR | Status: AC | PRN
  Administered 2014-07-08 (×2): 1 mg via INTRAVENOUS
  Filled 2014-07-08 (×2): qty 1

## 2014-07-08 MED ORDER — SODIUM CHLORIDE 0.9 % IV SOLN
INTRAVENOUS | Status: DC
  Administered 2014-07-08: 09:00:00 via INTRAVENOUS

## 2014-07-08 MED ORDER — MORPHINE SULFATE 15 MG PO TABS
22.5000 mg | ORAL_TABLET | ORAL | Status: DC | PRN
  Administered 2014-07-08: 22.5 mg via ORAL
  Filled 2014-07-08: qty 2

## 2014-07-08 NOTE — ED Notes (Signed)
Pt has been vomiting since 0300. Pt also c/o sickle cell crisis in left arm and has many in last couple days.

## 2014-07-08 NOTE — ED Notes (Signed)
Pt c/o of left arm pain for weeks, states feels like sickle cell pain and got worse today. Pt also complains of abd pain with N/V/D that started today.

## 2014-07-08 NOTE — Discharge Instructions (Signed)
°Emergency Department Resource Guide °1) Find a Doctor and Pay Out of Pocket °Although you won't have to find out who is covered by your insurance plan, it is a good idea to ask around and get recommendations. You will then need to call the office and see if the doctor you have chosen will accept you as a new patient and what types of options they offer for patients who are self-pay. Some doctors offer discounts or will set up payment plans for their patients who do not have insurance, but you will need to ask so you aren't surprised when you get to your appointment. ° °2) Contact Your Local Health Department °Not all health departments have doctors that can see patients for sick visits, but many do, so it is worth a call to see if yours does. If you don't know where your local health department is, you can check in your phone book. The CDC also has a tool to help you locate your state's health department, and many state websites also have listings of all of their local health departments. ° °3) Find a Walk-in Clinic °If your illness is not likely to be very severe or complicated, you may want to try a walk in clinic. These are popping up all over the country in pharmacies, drugstores, and shopping centers. They're usually staffed by nurse practitioners or physician assistants that have been trained to treat common illnesses and complaints. They're usually fairly quick and inexpensive. However, if you have serious medical issues or chronic medical problems, these are probably not your best option. ° °No Primary Care Doctor: °- Call Health Connect at  832-8000 - they can help you locate a primary care doctor that  accepts your insurance, provides certain services, etc. °- Physician Referral Service- 1-800-533-3463 ° °Chronic Pain Problems: °Organization         Address  Phone   Notes  °[facility]  (336) 297-2271 Patients need to be referred by their primary care doctor.  ° °Medication  Assistance: °Organization         Address  Phone   Notes  °[place] [facility] [address] °[place], [place] [address] (336) 641-8030 --Must be a resident of [place] °-- Must have NO insurance coverage whatsoever (no Medicaid/ Medicare, etc.) °-- The pt. MUST have a primary care doctor that directs their care regularly and follows them in the community °  °[facility]  (866) 331-1348   °[employer]  (888) 892-1162   ° °Agencies that provide inexpensive medical care: °Organization         Address  Phone   Notes  °[facility]  (336) 832-8035   °[facility]    (336) 832-7272   °[facility] [address] °[place], [place] [address] (336) 832-4777   °[facility] [address], °[place] (336) 271-4999   °[facility]    (336) 373-0678   °[facility]    (336) 272-1050   °[facility] ° [address], [place] Phone:  (336) 832-4444, Fax:  (336) 832-4440 Hours of Operation:  9 am - 6 pm, M-F.  Also accepts Medicaid/Medicare and self-pay.  °[facility] ° [address], [place] Phone: (336) 832-3150, Fax: (336) [phone]. Hours of Operation:  8:30 am - 5:30 pm, M-F.  Also accepts Medicaid and self-pay.  °[facility] [place] [address], [place] Phone: (336) 878-6027   °[facility] [address], [place], [place] (336)723-1848, Ext. 123 Mondays & Thursdays: 7-9 AM.  First 15 patients are seen on a first come, first serve basis. °  ° °Medicaid-accepting [place] Providers: ° °Organization         Address  Phone   Notes  °[facility] [address], [place] (336) 641-2100 Also accepts self-pay patients.  °[facility] [address], [place] ° (336) 856-9996   °[facility] [address], [place]  (336) 288-8857   °[facility] [address] [place] [address], [place] (336) 299-7000   °Veita Bland [address], [place]  ° (336) 373-1557 Only accepts [place] Access Medicaid patients after they have their name applied to their card.  ° °Self-Pay (no insurance) in [place]: ° °Organization         Address  Phone   Notes  °Sickle Cell Patients, [facility] [address], [place] (336) 832-1970   °[facility] [address], [place] (336) 832-4400   °[facility] ° [address], [place] (336) 992-4800   °[facility]/Dr. Osei-Bonsu ° [address] [place] [address], [place] or [address], [place] (336) [phone] Phone number for both [place] and [place] locations is the same.  °[facility] [address], [place] (336) 299-0000   °[facility] [address] [place] [address], [place] or [address] (336) 852-7530 °(336) 878-2260   °[facility] [address], [place] (336) 350-1642, phone; (336) 294-5005, fax Sees patients 1st and 3rd Saturday of every month.  Must not qualify for public or private insurance (i.e. Medicaid, Medicare, [place] Health Choice, Veterans' Benefits) • Household income should be no more than 200% of the poverty level •The clinic cannot treat you if you are pregnant or think you are pregnant • Sexually transmitted diseases are not treated at the clinic.  ° ° °Dental Care: °Organization         Address  Phone  Notes  °[place] [facility] [address], [place] (336) 641-6152 Accepts children up to age 21 who are enrolled in Medicaid or [place] Health Choice; pregnant women with a Medicaid card; and children who have applied for Medicaid or [place] Health Choice, but were declined, whose parents can pay a reduced fee at time of service.  °[place]  [facility] [place]  [address], [place] (336) 641-7733 Accepts children up to age 21 who are enrolled in Medicaid or [place] Health Choice; pregnant women with a Medicaid card; and children who have applied for Medicaid or [place] Health Choice, but were declined, whose parents can pay a reduced fee at time of service.  °[facility] ° [address],  (336) 641-4533 Patients are seen by appointment only. Walk-ins are not accepted. [facility] will see patients 18 years of age and older. °Monday - Tuesday (8am-5pm) °Most Wednesdays (8:30-5pm) °$30 per visit, cash only  °[facility] ° [address], [place] (336) 641-4533 Patients are seen by appointment only. Walk-ins are not accepted. [facility] will see patients 18 years of age and older. °One   Wednesday Evening (Monthly: Volunteer Based).  $30 per visit, cash only  °[facility]  (919) 537-3737 for adults; Children under age 4, call Graduate Pediatric Dentistry at (919) [phone]. Children aged 4-14, please call (919) [phone] to request a pediatric application. ° Dental services are provided in all areas of dental care including fillings, crowns and bridges, complete and partial dentures, implants, gum treatment, root canals, and extractions. Preventive care is also provided. Treatment is provided to both adults and children. °Patients are selected via a lottery and there is often a waiting list. °  °[facility] [address], °[place] ° (336) 763-8833 [email or web address] °  °[facility] [address], [place], [place] (336)723-1848, Ext. 123 Second and Fourth Thursday of each month, opens at 6:30 AM; Clinic ends at 9 AM.  Patients are seen on a first-come first-served basis, and a limited number are seen during each clinic.  ° °[facility] ° [address], [place], [place] (336) 723-7904    Eligibility Requirements °You must have lived in [place], [place], or [place] counties for at least the last three months. °  You cannot be eligible for state or federal sponsored healthcare insurance, including Veterans Administration, Medicaid, or Medicare. °  You generally cannot be eligible for healthcare insurance through your employer.  °  How to apply: °Eligibility screenings are held every Tuesday and Wednesday afternoon from 1:00 pm until 4:00 pm. You do not need an appointment for the interview!  °[facility] [address], [place], [place] 336-631-2330   °[facility]  336-342-8273   °[facility]  336-703-3100   °[facility]  336-570-6415   ° °Behavioral Health Resources in the Community: °Intensive Outpatient Programs °Organization         Address  Phone  Notes  °[facility] [address], [place], [place] 336-878-6098   °[facility] [address], [place], [place] 336-832-9800   °[facility] [address], [place], [place] ° 336-882-2125   °[facility] [address],  °[place], [place] 1-800-853-5163 or 336-641-4981   °Substance Abuse Resources °Organization         Address  Phone  Notes  °[facility]  336-882-2125   °[facility]  336-784-9470   °[facility]  336-285-9073   °[facility]  336-845-3988   °Residential & Outpatient Substance Abuse Program  1-800-659-3381   °Psychological Services °Organization         Address  Phone  Notes  °[facility]  336- 832-9600   °[facility]  336- 378-7881   °[facility] [address], [place] 1-800-853-5163 or 336-641-4981   ° °Mobile Crisis Teams °Organization         Address  Phone  Notes  °[facility], Mobile Crisis Care Unit  1-877-626-1772   °Assertive °[facility] ° [address]  [place], [place] 336-834-9664   °Sharon DeEsch [address] °[place] 336-554-5454   ° °Self-Help/Support Groups °Organization         Address  Phone             Notes  °[facility] - variety of support groups  336- 373-1402 Call for more information  °Narcotics Anonymous (NA), [facility] [address], °[place]  2 meetings at this location  ° °  Residential Treatment Programs Organization         Address  Phone  Notes  [facility] [address]    [place]  1-9385709773   [facility]  [address], [place] [address], [place], [place]   [place], [place] 239-347-4999 Admissions: 8am-3pm M-F  [facility] [place] [address]    [place], [place] 166-063-0160   [facility] [address] [place], [place], [place]   [facility] [address]  [place], [place]   [facility] - Intensive Outpatient [address], [place], [place]   [facility] ([place]  [place], [place] 1-580-853-5703 or 289-477-2701   [facility] [address], [place], [place] Accepts Medicaid  [facility] [address],  [place] 1-787-169-6238 Substance Abuse/Addiction Treatment   [facility]         Address  Phone  Notes  [facility]  954-036-0458   Domenic Schwab, PhD [address] [place], [place]   (301) 180-5957 or 534-083-2532   [facility] [place], [place] 7796876463   [facility] [address], [place], [place] 831-837-7291 Insurance/Medicaid/sponsorship through [facility] [address]                                    [place], [place] [phone] [place] [address], [place] [phone]    Dr. Adele Schilder  [phone]   [facility] Dept. 1) [address] [place] 2) [place] 3)  [place] [address], [place] [phone]  [phone]   [place] 423-247-5470 or [phone] (After Hours)      Take the prescription as directed.  Increase your fluid intake (ie:  Gatoraide) for the next few days, as discussed.  Eat a bland diet and advance to your regular diet slowly as you can tolerate it.   Avoid full strength juices, as well as milk and milk products until your diarrhea has resolved.   Call your regular medical doctor today to schedule a follow up appointment in the next 3 days.  Return to the Emergency Department immediately if not improving (or even worsening) despite taking the medicines as prescribed, any black or bloody stool or vomit, if you develop a fever over "101," or for any other concerns.

## 2014-07-08 NOTE — Progress Notes (Signed)
CSW met with pt at bedside. Pt shares that she usually drives herself to appointments however patient came by ems. Pt shares that she lives at home with her children who are 52 and 8. Pt shares that the 45 year old helps at times. Pt states she gets her prescriptions from Dr. Zigmund Daniel, and patient [facility] [address]. Patient shares that she was suppose to get new prescriptions today but has some medication still at home. Patient shares she's too tired and has never felt this bad. Pt shares she has a debit card and can go home by cab. CSW confirmed with 12 n go they do accepted credit and debit as payment. CSW provided pt with cab number [phone]. CSW consulted with RN CM regarding patient medications and prescriptions.    Noreene Larsson [phone]  ED CSW 07/08/2014 2:58 PM

## 2014-07-08 NOTE — ED Notes (Signed)
Bed: NI77 Expected date:  Expected time:  Means of arrival:  Comments: [facility]

## 2014-07-08 NOTE — ED Provider Notes (Signed)
CSN: [id number]     Arrival date & time 07/08/14  0757 History   First MD Initiated Contact with Patient 07/08/14 [id number]     Chief Complaint  Patient presents with  . Sickle Cell Pain Crisis  . Abdominal Pain     HPI Pt was seen at 0830. Per pt, c/o gradual onset and persistence of multiple intermittent episodes of N/V/D that began over night last night. Describes the stools as "watery." Has been associated with diffuse abd "cramping." Pt's daughter had similar symptoms this past week. Pt also c/o acute flair of her chronic left arm "pain" for the past 2 to 3 weeks. Pt was evaluated by her SS MD for same 2 weeks ago, rx her usual narcotic pain medications (refilled yesterday), and referred to Pain Management. Pt states she has not picked up this rx yet. Denies any change in her usual chronic pain pattern. Denies abd pain, no CP/SOB, no back pain, no fevers, no rash, no black or blood in stools or emesis.     Past Medical History  Diagnosis Date  . PMDD (premenstrual dysphoric disorder)   . Sickle cell anemia   . Sickle cell disease without crisis 07/15/2011  . Migraine with aura 07/15/2011  . GERD (gastroesophageal reflux disease)   . Asthma in adult     probable  . Depression   . Chronic pain syndrome   . Left arm pain     chronic  . Anxiety    Past Surgical History  Procedure Laterality Date  . Cesarean section      2   Family History  Problem Relation Age of Onset  . Hypertension Mother   . Asthma Son   . Asthma Son   . Asthma Daughter    History  Substance Use Topics  . Smoking status: Never Smoker   . Smokeless tobacco: Not on file  . Alcohol Use: No    Review of Systems ROS: Statement: All systems negative except as marked or noted in the HPI; Constitutional: Negative for fever and chills. ; ; Eyes: Negative for eye pain, redness and discharge. ; ; ENMT: Negative for ear pain, hoarseness, nasal congestion, sinus pressure and sore throat. ; ; Cardiovascular: Negative  for chest pain, palpitations, diaphoresis, dyspnea and peripheral edema. ; ; Respiratory: Negative for cough, wheezing and stridor. ; ; Gastrointestinal: +N/V/D, abd pain. Negative for blood in stool, hematemesis, jaundice and rectal bleeding. . ; ; Genitourinary: Negative for dysuria, flank pain and hematuria. ; ; Musculoskeletal: +chronic arm pain. Negative for back pain and neck pain. Negative for swelling and trauma.; ; Skin: Negative for pruritus, rash, abrasions, blisters, bruising and skin lesion.; ; Neuro: Negative for headache, lightheadedness and neck stiffness. Negative for weakness, altered level of consciousness , altered mental status, extremity weakness, paresthesias, involuntary movement, seizure and syncope.      Allergies  Sulfa antibiotics  Home Medications   Prior to Admission medications   Medication Sig Start Date End Date Taking? Authorizing Provider  albuterol (PROVENTIL HFA;VENTOLIN HFA) 108 (90 BASE) MCG/ACT inhaler Inhale 2 puffs into the lungs every 6 (six) hours as needed for wheezing or shortness of breath (wheezing).    Yes Historical Provider, MD  busPIRone (BUSPAR) 7.5 MG tablet Take 1 tablet (7.5 mg total) by mouth 3 (three) times daily. 06/08/14  Yes [name], MD  fluticasone (FLONASE) 50 MCG/ACT nasal spray Place 2 sprays into both nostrils daily as needed for allergies or rhinitis (allergies).  Yes Historical Provider, MD  folic acid (FOLVITE) 1 MG tablet Take 1 tablet (1 mg total) by mouth every morning. 05/26/14  Yes [name], MD  ibuprofen (ADVIL,MOTRIN) 600 MG tablet Take 1 tablet (600 mg total) by mouth every 8 (eight) hours as needed for moderate pain. 06/27/14  Yes Dorena Dew, FNP  LORazepam (ATIVAN) 1 MG tablet Take 1 tablet (1 mg total) by mouth every 8 (eight) hours as needed. for anxiety 05/26/14  Yes [name], MD  methadone (DOLOPHINE) 5 MG tablet Take 1 tablet (5 mg total) by mouth at bedtime. 06/23/14  Yes  [name], MD  morphine (MSIR) 15 MG tablet Take 1.5 tablets (22.5 mg total) by mouth every 4 (four) hours as needed for severe pain. 07/07/14  Yes Dorena Dew, FNP  polyethylene glycol (MIRALAX / GLYCOLAX) packet Take 17 g by mouth 3 (three) times a week.   Yes Historical Provider, MD  ergocalciferol (VITAMIN D2) 50000 UNITS capsule Take 1 capsule (50,000 Units total) by mouth once a week. Patient not taking: Reported on 07/08/2014 06/28/14   Dorena Dew, FNP   BP 155/85 mmHg  Pulse 75  Temp(Src) 98.9 F (37.2 C) (Oral)  Resp 18  SpO2 97%  LMP 05/30/2014 Physical Exam  0835: Physical examination:  Nursing notes reviewed; Vital signs and O2 SAT reviewed;  Constitutional: Well developed, Well nourished, Well hydrated, In no acute distress; Head:  Normocephalic, atraumatic; Eyes: EOMI, PERRL, No scleral icterus; ENMT: Mouth and pharynx normal, Mucous membranes moist; Neck: Supple, Full range of motion, No lymphadenopathy; Cardiovascular: Regular rate and rhythm, No murmur, rub, or gallop; Respiratory: Breath sounds clear & equal bilaterally, No rales, rhonchi, wheezes.  Speaking full sentences with ease, Normal respiratory effort/excursion; Chest: Nontender, Movement normal; Abdomen: Soft, +mild diffuse tenderness to palp. No rebound or guarding. +gagging/dry heaving during exam. Nondistended, Normal bowel sounds; Genitourinary: No CVA tenderness; Extremities: Pulses normal, No tenderness, No edema, No calf edema or asymmetry.; Neuro: AA&Ox3, Major CN grossly intact.  Speech clear. No gross focal motor or sensory deficits in extremities.; Skin: Color normal, Warm, Dry.   ED Course  Procedures     EKG Interpretation None      MDM  MDM Reviewed: previous chart, nursing note and vitals Reviewed previous: labs Interpretation: labs     Results for orders placed or performed during the hospital encounter of 07/08/14  CBC WITH DIFFERENTIAL  Result Value Ref Range   WBC  11.7 (H) 4.0 - 10.5 K/uL   RBC 4.54 3.87 - 5.11 MIL/uL   Hemoglobin 11.3 (L) 12.0 - 15.0 g/dL   HCT 33.4 (L) 36.0 - 46.0 %   MCV 73.6 (L) 78.0 - 100.0 fL   MCH 24.9 (L) 26.0 - 34.0 pg   MCHC 33.8 30.0 - 36.0 g/dL   RDW 18.4 (H) 11.5 - 15.5 %   Platelets 120 (L) 150 - 400 K/uL   Neutrophils Relative % 91 (H) 43 - 77 %   Neutro Abs 10.6 (H) 1.7 - 7.7 K/uL   Lymphocytes Relative 5 (L) 12 - 46 %   Lymphs Abs 0.6 (L) 0.7 - 4.0 K/uL   Monocytes Relative 4 3 - 12 %   Monocytes Absolute 0.4 0.1 - 1.0 K/uL   Eosinophils Relative 0 0 - 5 %   Eosinophils Absolute 0.0 0.0 - 0.7 K/uL   Basophils Relative 0 0 - 1 %   Basophils Absolute 0.0 0.0 - 0.1 K/uL  Comprehensive  metabolic panel  Result Value Ref Range   Sodium 136 135 - 145 mmol/L   Potassium 3.8 3.5 - 5.1 mmol/L   Chloride 105 96 - 112 mmol/L   CO2 24 19 - 32 mmol/L   Glucose, Bld 160 (H) 70 - 99 mg/dL   BUN 16 6 - 23 mg/dL   Creatinine, Ser 0.77 0.50 - 1.10 mg/dL   Calcium 8.8 8.4 - 10.5 mg/dL   Total Protein 8.2 6.0 - 8.3 g/dL   Albumin 4.5 3.5 - 5.2 g/dL   AST 18 0 - 37 U/L   ALT 12 0 - 35 U/L   Alkaline Phosphatase 57 39 - 117 U/L   Total Bilirubin 2.3 (H) 0.3 - 1.2 mg/dL   GFR calc non Af Amer >90 >90 mL/min   GFR calc Af Amer >90 >90 mL/min   Anion gap 7 5 - 15  Urinalysis with microscopic  Result Value Ref Range   Color, Urine YELLOW YELLOW   APPearance CLEAR CLEAR   Specific Gravity, Urine 1.013 1.005 - 1.030   pH 7.0 5.0 - 8.0   Glucose, UA NEGATIVE NEGATIVE mg/dL   Hgb urine dipstick NEGATIVE NEGATIVE   Bilirubin Urine NEGATIVE NEGATIVE   Ketones, ur NEGATIVE NEGATIVE mg/dL   Protein, ur 100 (A) NEGATIVE mg/dL   Urobilinogen, UA 1.0 0.0 - 1.0 mg/dL   Nitrite NEGATIVE NEGATIVE   Leukocytes, UA NEGATIVE NEGATIVE  Lipase, blood  Result Value Ref Range   Lipase 13 11 - 59 U/L  Pregnancy, urine  Result Value Ref Range   Preg Test, Ur NEGATIVE NEGATIVE  Urine microscopic-add on  Result Value Ref Range    Urine-Other      NO FORMED ELEMENTS SEEN ON URINE MICROSCOPIC EXAMINATION    Dg Abd Acute W/chest 07/08/2014   CLINICAL DATA:  Abdominal pain with nausea, vomiting, and diarrhea for 1 day. Chest pain for 1 day. Sickle cell disease.  EXAM: ACUTE ABDOMEN SERIES (ABDOMEN 2 VIEW & CHEST 1 VIEW)  COMPARISON:  Chest radiograph June 11, 2014 ; CT abdomen and pelvis April 19, 2012  FINDINGS: PA chest: No edema or consolidation. Heart size and pulmonary vascularity are normal. No adenopathy.  Supine and upright abdomen: There is moderate stool throughout the colon. There is no bowel dilatation or air-fluid level suggesting obstruction. No free air. Spleen is enlarged. No bone lesions appreciable.  IMPRESSION: Enlarged spleen.  Bowel gas pattern unremarkable.  Lungs clear.   Electronically Signed   By: Lowella Grip III M.D.   On: 07/08/2014 11:42    1310:  Labs per baseline. Pt has tol PO well without N/V. Continues to request "more pain medicine," but has been asleep most of her ED visit. Pt given her usual PO MSIR and has not vomited. Pt states she "is in too much pain and just can't go home." When asked where she hurts she states "all over" as well as "my left arm" (her chronic pain). T/C to [facility] Service NP Smith Robert, case discussed, including:  HPI, pertinent PM/SHx, VS/PE, dx testing, ED course and treatment:  Agrees with ED workup, Dr. Alben Deeds will come to the ED to evaluate pt.   1415:  SS Service Dr. Alben Deeds has evaluated pt in the ED: states there is no indication for admission at this time and pt can go home, she also notes that pt already has a MSIR rx written yesterday by the SS MD and pt needs to pick it up; she reminded pt of  this. Dx and testing d/w pt.  Questions answered.  Verb understanding, agreeable to d/c home with outpt f/u.     Francine Graven, DO 07/11/14 1114

## 2014-07-20 ENCOUNTER — Other Ambulatory Visit (INDEPENDENT_AMBULATORY_CARE_PROVIDER_SITE_OTHER): Payer: Medicare Other

## 2014-07-20 ENCOUNTER — Telehealth: Payer: Self-pay | Admitting: Internal Medicine

## 2014-07-20 DIAGNOSIS — G894 Chronic pain syndrome: Secondary | ICD-10-CM

## 2014-07-20 DIAGNOSIS — D509 Iron deficiency anemia, unspecified: Secondary | ICD-10-CM | POA: Diagnosis not present

## 2014-07-20 DIAGNOSIS — D5720 Sickle-cell/Hb-C disease without crisis: Secondary | ICD-10-CM

## 2014-07-20 LAB — COMPLETE METABOLIC PANEL WITH GFR
ALT: 8 U/L (ref 0–35)
AST: 12 U/L (ref 0–37)
Albumin: 4.3 g/dL (ref 3.5–5.2)
Alkaline Phosphatase: 48 U/L (ref 39–117)
BILIRUBIN TOTAL: 2.9 mg/dL — AB (ref 0.2–1.2)
BUN: 12 mg/dL (ref 6–23)
CALCIUM: 9.5 mg/dL (ref 8.4–10.5)
CO2: 24 mEq/L (ref 19–32)
CREATININE: 0.76 mg/dL (ref 0.50–1.10)
Chloride: 103 mEq/L (ref 96–112)
GFR, Est Non African American: >89 mL/min
Glucose, Bld: 109 mg/dL — ABNORMAL HIGH (ref 70–99)
POTASSIUM: 4.4 meq/L (ref 3.5–5.3)
Sodium: 139 mEq/L (ref 135–145)
Total Protein: 7.4 g/dL (ref 6.0–8.3)

## 2014-07-20 LAB — CBC WITH DIFFERENTIAL/PLATELET
BASOS ABS: 0 10*3/uL (ref 0.0–0.1)
Basophils Relative: 0 % (ref 0–1)
Eosinophils Absolute: 0.1 10*3/uL (ref 0.0–0.7)
Eosinophils Relative: 1 % (ref 0–5)
HCT: 34.9 % — ABNORMAL LOW (ref 36.0–46.0)
HEMOGLOBIN: 11.4 g/dL — AB (ref 12.0–15.0)
LYMPHS PCT: 20 % (ref 12–46)
Lymphs Abs: 2.1 10*3/uL (ref 0.7–4.0)
MCH: 24.5 pg — AB (ref 26.0–34.0)
MCHC: 32.7 g/dL (ref 30.0–36.0)
MCV: 74.9 fL — ABNORMAL LOW (ref 78.0–100.0)
Monocytes Absolute: 0.5 10*3/uL (ref 0.1–1.0)
Monocytes Relative: 5 % (ref 3–12)
NEUTROS ABS: 7.9 10*3/uL — AB (ref 1.7–7.7)
Neutrophils Relative %: 74 % (ref 43–77)
Platelets: 186 10*3/uL (ref 150–400)
RBC: 4.66 MIL/uL (ref 3.87–5.11)
RDW: 19.5 % — ABNORMAL HIGH (ref 11.5–15.5)
WBC: 10.7 10*3/uL — ABNORMAL HIGH (ref 4.0–10.5)

## 2014-07-20 MED ORDER — MORPHINE SULFATE 15 MG PO TABS: 22.5000 mg | ORAL_TABLET | ORAL | Status: DC | PRN

## 2014-07-20 MED ORDER — METHADONE HCL 5 MG PO TABS: 5.0000 mg | ORAL_TABLET | Freq: Every day | ORAL | Status: DC

## 2014-07-20 NOTE — Telephone Encounter (Signed)
Meds ordered this encounter  Medications  . methadone (DOLOPHINE) 5 MG tablet    Sig: Take 1 tablet (5 mg total) by mouth at bedtime.    Dispense:  30 tablet    Refill:  0    Order Specific Question:  Supervising Provider    Answer:  MATTHEWS, MICHELLE A [[id number]]  . morphine (MSIR) 15 MG tablet    Sig: Take 1.5 tablets (22.5 mg total) by mouth every 4 (four) hours as needed for severe pain.    Dispense:  135 tablet    Refill:  0    Order Specific Question:  Supervising Provider    Answer:  Liston Alba A [[id number]]  Reviewed [place] Substance Reporting system prior to reorder   Dorena Dew, FNP

## 2014-07-20 NOTE — Telephone Encounter (Signed)
Refill request for metadone. LOV 06/27/2014. Please advise. Thanks!

## 2014-07-27 ENCOUNTER — Ambulatory Visit (INDEPENDENT_AMBULATORY_CARE_PROVIDER_SITE_OTHER): Payer: Medicare Other | Admitting: Family Medicine

## 2014-07-27 VITALS — BP 128/92 | HR 104 | Temp 98.5°F | Resp 16 | Ht 67.0 in | Wt 231.0 lb

## 2014-07-27 DIAGNOSIS — Z0000 Encounter for general adult medical examination without abnormal findings: Secondary | ICD-10-CM

## 2014-07-27 NOTE — Progress Notes (Signed)
ANNUAL PREVENTATIVE VISIT AND CPE  Subjective:  Cassie Montgomery is a 44 y.o. female who presents for Annual Wellness Visit.   Cassie Montgomery does not have a history of hypertension . Her blood pressure has been controlled at home, today her BP is BP: (!) 128/92 mmHg She does not workout. She denies chest pain, shortness of breath, dizziness.  She is not on cholesterol medication and denies myalgias. Her cholesterol is at goal.   She does not had a history of diabetes. She has not consistently been working on diet and exercise regimen for obesity. She denies increased appetite, nausea, polydipsia, polyuria, visual disturbances, vomiting and weight loss. Last A1C in the office was:  Lab Results  Component Value Date   HGBA1C 4.0 08/27/2013   Patient is on Vitamin D supplement.   Lab Results  Component Value Date   VD25OH 15* 06/24/2013       Names of Other Physician/Practitioners you currently use: 1. [facility] here for primary care  Patient Care Team: [name], MD as PCP - General (Internal Medicine)   Medication Review: Current Outpatient Prescriptions on File Prior to Visit  Medication Sig Dispense Refill  . albuterol (PROVENTIL HFA;VENTOLIN HFA) 108 (90 BASE) MCG/ACT inhaler Inhale 2 puffs into the lungs every 6 (six) hours as needed for wheezing or shortness of breath (wheezing).     . busPIRone (BUSPAR) 7.5 MG tablet Take 1 tablet (7.5 mg total) by mouth 3 (three) times daily. 60 tablet 0  . ergocalciferol (VITAMIN D2) 50000 UNITS capsule Take 1 capsule (50,000 Units total) by mouth once a week. (Patient not taking: Reported on 07/08/2014) 4 capsule 3  . fluticasone (FLONASE) 50 MCG/ACT nasal spray Place 2 sprays into both nostrils daily as needed for allergies or rhinitis (allergies).     . folic acid (FOLVITE) 1 MG tablet Take 1 tablet (1 mg total) by mouth every morning. 30 tablet 11  . ibuprofen (ADVIL,MOTRIN) 600 MG tablet Take 1 tablet (600 mg  total) by mouth every 8 (eight) hours as needed for moderate pain. 30 tablet 1  . LORazepam (ATIVAN) 1 MG tablet Take 1 tablet (1 mg total) by mouth every 8 (eight) hours as needed. for anxiety 90 tablet 5  . methadone (DOLOPHINE) 5 MG tablet Take 1 tablet (5 mg total) by mouth at bedtime. 30 tablet 0  . morphine (MSIR) 15 MG tablet Take 1.5 tablets (22.5 mg total) by mouth every 4 (four) hours as needed for severe pain. 135 tablet 0  . ondansetron (ZOFRAN) 4 MG tablet Take 1 tablet (4 mg total) by mouth every 8 (eight) hours as needed for nausea or vomiting. 6 tablet 0  . polyethylene glycol (MIRALAX / GLYCOLAX) packet Take 17 g by mouth 3 (three) times a week.     No current facility-administered medications on file prior to visit.    Current Problems (verified) Patient Active Problem List   Diagnosis Date Noted  . Vitamin D deficiency 06/28/2014  . Sickle cell pain crisis 06/12/2014  . GERD (gastroesophageal reflux disease) 06/12/2014  . Sickle cell anemia with crisis 06/12/2014  . Asthma in adult   . S/P cholecystectomy 06/11/2014  . Cholecystitis, acute 06/10/2014  . Chronic prescription opiate use 01/12/2014  . Depression, major 10/07/2013  . PTSD (post-traumatic stress disorder) 10/07/2013  . Medication side effects 10/07/2013  . Unspecified constipation 09/27/2013  . Weight gain 09/27/2013  . Irregular menstrual cycle 05/12/2013  . Panic anxiety syndrome 05/11/2013  .  Insomnia 05/11/2013  . Pap smear for cervical cancer screening 05/11/2013  . Sickle cell disease, type [place] 02/15/2013  . Chronic pain syndrome 01/06/2013  . Depressive disorder, not elsewhere classified 10/20/2012  . Generalized anxiety disorder 10/20/2012  . Opiate analgesic contract exists 09/11/2012  . Anemia 12/07/2011  . Leg pain 12/07/2011  . Thrombocytopenia 12/07/2011    Screening Tests Health Maintenance  Topic Date Due  . INFLUENZA VACCINE  12/12/2014  . PAP SMEAR  05/11/2016  . TETANUS/TDAP   06/12/2023  . HIV Screening  Completed    Immunization History  Administered Date(s) Administered  . Influenza,inj,Quad PF,36+ Mos 02/15/2013, 01/19/2014, 06/13/2014  . Pneumococcal Polysaccharide-23 06/11/2013, 06/13/2014  . Tdap 06/11/2013    Preventative care: Last colonoscopy: Never Last mammogram: 2015 Last pap smear/pelvic exam: July 2015    Medication List       This list is accurate as of: 07/27/14  1:48 PM.  Always use your most recent med list.               albuterol 108 (90 BASE) MCG/ACT inhaler  Commonly known as:  PROVENTIL HFA;VENTOLIN HFA  Inhale 2 puffs into the lungs every 6 (six) hours as needed for wheezing or shortness of breath (wheezing).     busPIRone 7.5 MG tablet  Commonly known as:  BUSPAR  Take 1 tablet (7.5 mg total) by mouth 3 (three) times daily.     ergocalciferol 50000 UNITS capsule  Commonly known as:  VITAMIN D2  Take 1 capsule (50,000 Units total) by mouth once a week.     fluticasone 50 MCG/ACT nasal spray  Commonly known as:  FLONASE  Place 2 sprays into both nostrils daily as needed for allergies or rhinitis (allergies).     folic acid 1 MG tablet  Commonly known as:  FOLVITE  Take 1 tablet (1 mg total) by mouth every morning.     ibuprofen 600 MG tablet  Commonly known as:  ADVIL,MOTRIN  Take 1 tablet (600 mg total) by mouth every 8 (eight) hours as needed for moderate pain.     LORazepam 1 MG tablet  Commonly known as:  ATIVAN  Take 1 tablet (1 mg total) by mouth every 8 (eight) hours as needed. for anxiety     methadone 5 MG tablet  Commonly known as:  DOLOPHINE  Take 1 tablet (5 mg total) by mouth at bedtime.     morphine 15 MG tablet  Commonly known as:  MSIR  Take 1.5 tablets (22.5 mg total) by mouth every 4 (four) hours as needed for severe pain.     ondansetron 4 MG tablet  Commonly known as:  ZOFRAN  Take 1 tablet (4 mg total) by mouth every 8 (eight) hours as needed for nausea or vomiting.      polyethylene glycol packet  Commonly known as:  MIRALAX / GLYCOLAX  Take 17 g by mouth 3 (three) times a week.        Past Surgical History  Procedure Laterality Date  . Cesarean section      2   Family History  Problem Relation Age of Onset  . Hypertension Mother   . Asthma Son   . Asthma Son   . Asthma Daughter     History reviewed: allergies, current medications, past family history, past medical history, past social history, past surgical history and problem list   Risk Factors: Osteoporosis/FallRisk: In the past year have you fallen or had a near fall?No History  of fracture in the past year: No  Tobacco History  Substance Use Topics  . Smoking status: Never Smoker   . Smokeless tobacco: Not on file  . Alcohol Use: No   She does not smoke.  Patient is not a former smoker. Alcohol Current alcohol use: none  Caffeine Current caffeine use: coffee 1 /day  Exercise Current exercise: none  Nutrition/Diet Current diet: in general, a "healthy" diet    Cardiac risk factors: obesity (BMI >= 30 kg/m2).  Depression Screen (Note: if answer to either of the following is "Yes", a more complete depression screening is indicated)   Q1: Over the past two weeks, have you felt down, depressed or hopeless? No  Q2: Over the past two weeks, have you felt little interest or pleasure in doing things? No  Have you lost interest or pleasure in daily life? No  Do you often feel hopeless? No  Do you cry easily over simple problems?No   Activities of Daily Living In your present state of health, do you have any difficulty performing the following activities?:  Driving? No Managing money?  No Preparing food and eating?: No Bathing or showering? No In the past year have you fallen or had a near fall?:No Are you sexually active? No  Do you have more than one partner?  No  Vision Difficulties: No  Hearing Difficulties:  Do you often ask people to speak up or repeat  themselves? No  Cognition  Do you feel that you have a problem with memory? No  Do you often misplace items?No  Do you feel safe at home?  Yes  Advanced directives Does patient have a [place]? No Does patient have a Living Will? No   Objective:     Blood pressure 128/92, pulse 104, temperature 98.5 F (36.9 C), temperature source Oral, resp. rate 16, height 5\' 7"  (1.702 m), weight 231 lb (104.781 kg), last menstrual period 06/30/2014. Body mass index is 36.17 kg/(m^2).  General appearance: alert, no distress, WD/WN, female Cognitive Testing  Alert? Yes  Normal Appearance?Yes  Oriented to person? Yes  Place? Yes   Time? Yes  Recall of three objects?  Yes  Can perform simple calculations? Yes  Displays appropriate judgment?Yes  Can read the correct time from a watch face?Yes   Assessment:  Patient denies any difficulties at home. No trouble with ADLs, depression or falls. No recent changes to vision or hearing. Is UTD with immunizations. . Discussed Advanced Directives, patient agrees to bring [place] copies of documents if can. Encouraged heart healthy diet, exercise as tolerated and adequate sleep.    Plan:   During the course of the visit the patient was educated and counseled about appropriate screening and preventive services including:    Pneumococcal vaccine   Influenza vaccine  Td vaccine  Diabetes screening  Glaucoma screening  Nutrition counseling   Advanced directives: requested  Screening recommendations, referrals: Nutrition assessed and recommended  Recommended yearly ophthalmology/optometry visit for glaucoma screening and checkup Recommended yearly dental visit for hygiene and checkup Advanced directives - discussed  Conditions/risks identified: BMI: Discussed weight loss, diet, and increase physical activity.  Increase physical activity: AHA recommends 150 minutes of physical activity a week.  Medications  reviewed   Medicare Attestation I have personally reviewed: The patient's medical and social history Their use of alcohol, tobacco or illicit drugs Their current medications and supplements The patient's functional ability including ADLs,fall risks, home safety risks, cognitive, and hearing and visual  impairment Diet and physical activities Evidence for depression or mood disorders  The patient's weight, height, BMI, and visual acuity have been recorded in the chart.  I have made referrals, counseling, and provided education to the patient based on review of the above and I have provided the patient with a written personalized care plan for preventive services.     Jaiyah Beining Jerilynn Mages, FNP   07/27/2014

## 2014-07-30 ENCOUNTER — Encounter: Payer: Self-pay | Admitting: Family Medicine

## 2014-08-03 ENCOUNTER — Telehealth: Payer: Self-pay | Admitting: Internal Medicine

## 2014-08-03 DIAGNOSIS — D5720 Sickle-cell/Hb-C disease without crisis: Secondary | ICD-10-CM

## 2014-08-03 MED ORDER — MORPHINE SULFATE 15 MG PO TABS: 22.5000 mg | ORAL_TABLET | ORAL | Status: DC | PRN

## 2014-08-03 NOTE — Telephone Encounter (Signed)
Meds ordered this encounter  Medications  . morphine (MSIR) 15 MG tablet    Sig: Take 1.5 tablets (22.5 mg total) by mouth every 4 (four) hours as needed for severe pain.    Dispense:  135 tablet    Refill:  0    Rx not to be filled prior to 08/06/2014    Order Specific Question:  Supervising Provider    Answer:  Liston Alba A [[id number]]  Reviewed [place] Substance Reporting system prior to reorder  Dorena Dew, FNP

## 2014-08-03 NOTE — Telephone Encounter (Signed)
Refill request for MSIR 15mg  LOV 07/27/2014. Please advise. Thanks!

## 2014-08-14 ENCOUNTER — Other Ambulatory Visit: Payer: Self-pay | Admitting: Internal Medicine

## 2014-08-17 ENCOUNTER — Other Ambulatory Visit: Payer: Self-pay | Admitting: Internal Medicine

## 2014-08-17 DIAGNOSIS — G894 Chronic pain syndrome: Secondary | ICD-10-CM

## 2014-08-17 DIAGNOSIS — D5720 Sickle-cell/Hb-C disease without crisis: Secondary | ICD-10-CM

## 2014-08-17 DIAGNOSIS — Z79891 Long term (current) use of opiate analgesic: Secondary | ICD-10-CM

## 2014-08-17 NOTE — Telephone Encounter (Signed)
Refill request for Methadone 5mg  and MSIR 15mg . LOV 07/27/2014. Please advise. Thanks!

## 2014-08-18 ENCOUNTER — Ambulatory Visit: Payer: Medicare Other | Admitting: Family Medicine

## 2014-08-19 MED ORDER — METHADONE HCL 5 MG PO TABS: 5.0000 mg | ORAL_TABLET | Freq: Every day | ORAL | Status: DC

## 2014-08-19 MED ORDER — MORPHINE SULFATE 15 MG PO TABS: 22.5000 mg | ORAL_TABLET | ORAL | Status: DC | PRN

## 2014-08-19 NOTE — Telephone Encounter (Signed)
  Refill request for Methadone 5mg  and MSIR 15mg . LOV 07/27/2014. Please advise. Thanks!        Called in 08/17/2014

## 2014-08-19 NOTE — Telephone Encounter (Signed)
Prescription written for Metahdone 5 mg #30 tabs and MS IR 15 mg #135 tabs. NCCSRS reviewed and no inconsistencies noted. Pt to give urine for prescription dreug screen at time of picking up prescription.

## 2014-08-29 ENCOUNTER — Ambulatory Visit: Payer: Medicare Other | Admitting: Family Medicine

## 2014-08-31 ENCOUNTER — Telehealth: Payer: Self-pay | Admitting: Internal Medicine

## 2014-08-31 DIAGNOSIS — F4310 Post-traumatic stress disorder, unspecified: Secondary | ICD-10-CM

## 2014-08-31 DIAGNOSIS — F410 Panic disorder [episodic paroxysmal anxiety] without agoraphobia: Secondary | ICD-10-CM

## 2014-08-31 DIAGNOSIS — D5720 Sickle-cell/Hb-C disease without crisis: Secondary | ICD-10-CM

## 2014-08-31 MED ORDER — IBUPROFEN 600 MG PO TABS: 600.0000 mg | ORAL_TABLET | Freq: Three times a day (TID) | ORAL | Status: DC | PRN

## 2014-08-31 MED ORDER — MORPHINE SULFATE 15 MG PO TABS: 15.0000 mg | ORAL_TABLET | ORAL | Status: DC | PRN

## 2014-08-31 NOTE — Telephone Encounter (Signed)
Refill request for MSIR 15mg  and ambien LOV 07/27/2014 with [place]. Please advise. Thanks!

## 2014-08-31 NOTE — Telephone Encounter (Signed)
Prescription written for MS IR 15 mg #90 tabs. NCCSRS reviewed and no inconsistencies noted.  I spoke with Ms. Bose about weaning her dose of Opiates and she is ready to start weaning her dose. I will decrease her current dose to 15 mg q 4 hours PRN. I have also discussed Hydrea use and she is still resistant to taking Hydrea. Will discuss again on next visit. I also discussed with her the need to work on weaning Ativan for anxiety. We have discussed therapy and she has refused. I will also work on weaning the Ativan and refer to Psychiatry for alternative treatment options.

## 2014-09-04 ENCOUNTER — Other Ambulatory Visit: Payer: Self-pay | Admitting: Internal Medicine

## 2014-09-14 ENCOUNTER — Telehealth: Payer: Self-pay | Admitting: Internal Medicine

## 2014-09-14 DIAGNOSIS — G894 Chronic pain syndrome: Secondary | ICD-10-CM

## 2014-09-14 DIAGNOSIS — D5720 Sickle-cell/Hb-C disease without crisis: Secondary | ICD-10-CM

## 2014-09-14 NOTE — Telephone Encounter (Signed)
Refill request for MSIR 15mg  and methadone 5mg . LOV 07/27/2014. Please advise. Thanks!

## 2014-09-15 MED ORDER — METHADONE HCL 5 MG PO TABS
5.0000 mg | ORAL_TABLET | Freq: Every day | ORAL | Status: DC
Start: 2014-09-15 — End: 2014-10-14

## 2014-09-15 MED ORDER — MORPHINE SULFATE 15 MG PO TABS: 15.0000 mg | ORAL_TABLET | ORAL | Status: DC | PRN

## 2014-09-15 NOTE — Telephone Encounter (Signed)
Appointment will be scheduled and drug screen done when patient picks up rx. Thanks!

## 2014-09-15 NOTE — Telephone Encounter (Signed)
Reviewed daily Morphine equivalence which is 110, Cassie Montgomery will need a referral to pain management. She will also need to schedule a follow up for medication management in 1 month. Reviewed [place] Substance Reporting system prior to reorder, no inconsistencies noted.  Meds ordered this encounter  Medications  . methadone (DOLOPHINE) 5 MG tablet    Sig: Take 1 tablet (5 mg total) by mouth at bedtime.    Dispense:  30 tablet    Refill:  0    Order Specific Question:  Supervising Provider    Answer:  MATTHEWS, MICHELLE A [[id number]]  . morphine (MSIR) 15 MG tablet    Sig: Take 1 tablet (15 mg total) by mouth every 4 (four) hours as needed for severe pain.    Dispense:  90 tablet    Refill:  0    Order Specific Question:  Supervising Provider    Answer:  Liston Alba A [[id number]]   Dorena Dew, FNP

## 2014-09-16 ENCOUNTER — Other Ambulatory Visit: Payer: Medicare Other

## 2014-09-16 DIAGNOSIS — Z79899 Other long term (current) drug therapy: Secondary | ICD-10-CM | POA: Diagnosis not present

## 2014-09-19 LAB — OPIATES/OPIOIDS (LC/MS-MS)
CODEINE URINE: NEGATIVE ng/mL (ref <50)
Hydrocodone: NEGATIVE ng/mL (ref <50)
Hydromorphone: 188 ng/mL — AB (ref <50)
Morphine Urine: 28706 ng/mL — AB (ref <50)
Norhydrocodone, Ur: NEGATIVE ng/mL (ref <50)
Noroxycodone, Ur: NEGATIVE ng/mL (ref <50)
Oxycodone, ur: NEGATIVE ng/mL (ref <50)
Oxymorphone: NEGATIVE ng/mL (ref <50)

## 2014-09-19 LAB — AMPHETAMINES (GC/LC/MS), URINE
AMPHETAMINE CONF, UR: 919 ng/mL — AB (ref <250)
Methamphetamine Quant, Ur: NEGATIVE ng/mL (ref <250)

## 2014-09-19 LAB — BENZODIAZEPINES (GC/LC/MS), URINE
ALPRAZOLAMU: NEGATIVE ng/mL (ref <25)
Clonazepam metabolite (GC/LC/MS), ur confirm: NEGATIVE ng/mL (ref <25)
Flurazepam metabolite (GC/LC/MS), ur confirm: NEGATIVE ng/mL (ref <50)
LORAZEPAMU: 3296 ng/mL — AB (ref <50)
MIDAZOLAMU: NEGATIVE ng/mL (ref <50)
Nordiazepam (GC/LC/MS), ur confirm: NEGATIVE ng/mL (ref <50)
Oxazepam (GC/LC/MS), ur confirm: NEGATIVE ng/mL (ref <50)
TEMAZEPAMU: NEGATIVE ng/mL (ref <50)
Triazolam metabolite (GC/LC/MS), ur confirm: NEGATIVE ng/mL (ref <50)

## 2014-09-20 LAB — PRESCRIPTION MONITORING PROFILE (SOLSTAS)
Barbiturate Screen, Urine: NEGATIVE ng/mL
Buprenorphine, Urine: NEGATIVE ng/mL
CARISOPRODOL, URINE: NEGATIVE ng/mL
COCAINE METABOLITES: NEGATIVE ng/mL
Cannabinoid Scrn, Ur: NEGATIVE ng/mL
Creatinine, Urine: 168.97 mg/dL (ref >20.0)
Fentanyl, Ur: NEGATIVE ng/mL
MDMA URINE: NEGATIVE ng/mL
Meperidine, Ur: NEGATIVE ng/mL
Methadone Screen, Urine: NEGATIVE ng/mL
Nitrites, Initial: NEGATIVE ug/mL
OXYCODONE SCRN UR: NEGATIVE ng/mL
PH URINE, INITIAL: 5.1 pH (ref 4.5–8.9)
PROPOXYPHENE: NEGATIVE ng/mL
TRAMADOL UR: NEGATIVE ng/mL
Tapentadol, urine: NEGATIVE ng/mL
Zolpidem, Urine: NEGATIVE ng/mL

## 2014-09-25 ENCOUNTER — Other Ambulatory Visit: Payer: Self-pay | Admitting: Internal Medicine

## 2014-09-28 ENCOUNTER — Telehealth: Payer: Self-pay | Admitting: Internal Medicine

## 2014-09-28 DIAGNOSIS — D5720 Sickle-cell/Hb-C disease without crisis: Secondary | ICD-10-CM

## 2014-09-29 NOTE — Telephone Encounter (Signed)
Refill request for msir 15mg . lOV 07/27/2014. Please advise. Thanks!

## 2014-09-30 MED ORDER — MORPHINE SULFATE 15 MG PO TABS: 15.0000 mg | ORAL_TABLET | ORAL | Status: DC | PRN

## 2014-09-30 NOTE — Telephone Encounter (Signed)
Cassie Montgomery, patient with a history of sickle cell anemia, HbSS will follow up for pain management on 10/14/2014. Reviewed [place] Substance Reporting system prior to reorder, no inconsistencies noted.   Meds ordered this encounter  Medications  . morphine (MSIR) 15 MG tablet    Sig: Take 1 tablet (15 mg total) by mouth every 4 (four) hours as needed for severe pain.    Dispense:  90 tablet    Refill:  0    Order Specific Question:  Supervising Provider    Answer:  Liston Alba A [[id number]]    Dorena Dew, FNP

## 2014-10-12 ENCOUNTER — Telehealth: Payer: Self-pay | Admitting: Internal Medicine

## 2014-10-13 NOTE — Telephone Encounter (Signed)
Will not process prescription request at this time. Cassie Montgomery has an appointment for medication management scheduled on 10/14/2014. She and I will review medications and establish a plan for weaning medications.   Dorena Dew, FNP

## 2014-10-13 NOTE — Telephone Encounter (Signed)
Refill request for Methadone 5mg  and MSIR 15mg . LOV 07/27/2014. Patient next scheduled appointment is 10/14/2014. Thanks!

## 2014-10-14 ENCOUNTER — Ambulatory Visit (INDEPENDENT_AMBULATORY_CARE_PROVIDER_SITE_OTHER): Payer: Medicare Other | Admitting: Family Medicine

## 2014-10-14 VITALS — BP 127/80 | HR 77 | Temp 98.3°F | Resp 16 | Ht 67.0 in | Wt 232.0 lb

## 2014-10-14 DIAGNOSIS — N912 Amenorrhea, unspecified: Secondary | ICD-10-CM

## 2014-10-14 DIAGNOSIS — D5720 Sickle-cell/Hb-C disease without crisis: Secondary | ICD-10-CM

## 2014-10-14 DIAGNOSIS — G894 Chronic pain syndrome: Secondary | ICD-10-CM

## 2014-10-14 DIAGNOSIS — R109 Unspecified abdominal pain: Secondary | ICD-10-CM | POA: Diagnosis not present

## 2014-10-14 LAB — COMPLETE METABOLIC PANEL WITH GFR
ALK PHOS: 56 U/L (ref 39–117)
ALT: 9 U/L (ref 0–35)
AST: 12 U/L (ref 0–37)
Albumin: 4.4 g/dL (ref 3.5–5.2)
BILIRUBIN TOTAL: 2 mg/dL — AB (ref 0.2–1.2)
BUN: 9 mg/dL (ref 6–23)
CO2: 24 mEq/L (ref 19–32)
Calcium: 9 mg/dL (ref 8.4–10.5)
Chloride: 104 mEq/L (ref 96–112)
Creat: 0.66 mg/dL (ref 0.50–1.10)
GFR, Est Non African American: >89 mL/min
Glucose, Bld: 79 mg/dL (ref 70–99)
Potassium: 3.7 mEq/L (ref 3.5–5.3)
Sodium: 140 mEq/L (ref 135–145)
Total Protein: 7.4 g/dL (ref 6.0–8.3)

## 2014-10-14 LAB — POCT URINALYSIS DIP (DEVICE)
Bilirubin Urine: NEGATIVE
GLUCOSE, UA: NEGATIVE mg/dL
Hgb urine dipstick: NEGATIVE
KETONES UR: NEGATIVE mg/dL
Leukocytes, UA: NEGATIVE
NITRITE: NEGATIVE
PROTEIN: NEGATIVE mg/dL
Specific Gravity, Urine: 1.015 (ref 1.005–1.030)
Urobilinogen, UA: 0.2 mg/dL (ref 0.0–1.0)
pH: 5.5 (ref 5.0–8.0)

## 2014-10-14 LAB — POCT URINE PREGNANCY: Preg Test, Ur: NEGATIVE

## 2014-10-14 MED ORDER — MORPHINE SULFATE 15 MG PO TABS: 15.0000 mg | ORAL_TABLET | Freq: Four times a day (QID) | ORAL | Status: DC | PRN

## 2014-10-14 MED ORDER — METHADONE HCL 5 MG PO TABS: 5.0000 mg | ORAL_TABLET | Freq: Every day | ORAL | Status: DC

## 2014-10-14 NOTE — Patient Instructions (Signed)
Sickle Cell Anemia, Adult °Sickle cell anemia is a condition in which red blood cells have an abnormal "sickle" shape. This abnormal shape shortens the cells' life span, which results in a lower than normal concentration of red blood cells in the blood. The sickle shape also causes the cells to clump together and block free blood flow through the blood vessels. As a result, the tissues and organs of the body do not receive enough oxygen. Sickle cell anemia causes organ damage and pain and increases the risk of infection. °CAUSES  °Sickle cell anemia is a genetic disorder. Those who receive two copies of the gene have the condition, and those who receive one copy have the trait. °RISK FACTORS °The sickle cell gene is most common in people whose families originated in [place]. Other areas of the globe where sickle cell trait occurs include the Mediterranean, [place] and [place], the [place], and the [place].  °SIGNS AND SYMPTOMS °· Pain, especially in the extremities, back, chest, or abdomen (common). The pain may start suddenly or may develop following an illness, especially if there is dehydration. Pain can also occur due to overexertion or exposure to extreme temperature changes. °· Frequent severe bacterial infections, especially certain types of pneumonia and meningitis. °· Pain and swelling in the hands and feet. °· Decreased activity.   °· Loss of appetite.   °· Change in behavior. °· Headaches. °· Seizures. °· Shortness of breath or difficulty breathing. °· Vision changes. °· Skin ulcers. °Those with the trait may not have symptoms or they may have mild symptoms.  °DIAGNOSIS  °Sickle cell anemia is diagnosed with blood tests that demonstrate the genetic trait. It is often diagnosed during the newborn period, due to mandatory testing nationwide. A variety of blood tests, X-rays, CT scans, MRI scans, ultrasounds, and lung function tests may also be done to monitor the condition. °TREATMENT  °Sickle  cell anemia may be treated with: °· Medicines. You may be given pain medicines, antibiotic medicines (to treat and prevent infections) or medicines to increase the production of certain types of hemoglobin. °· Fluids. °· Oxygen. °· Blood transfusions. °HOME CARE INSTRUCTIONS  °· Drink enough fluid to keep your urine clear or pale yellow. Increase your fluid intake in hot weather and during exercise. °· Do not smoke. Smoking lowers oxygen levels in the blood.   °· Only take over-the-counter or prescription medicines for pain, fever, or discomfort as directed by your health care provider. °· Take antibiotics as directed by your health care provider. Make sure you finish them it even if you start to feel better.   °· Take supplements as directed by your health care provider.   °· Consider wearing a medical alert bracelet. This tells anyone caring for you in an emergency of your condition.   °· When traveling, keep your medical information, health care provider's names, and the medicines you take with you at all times.   °· If you develop a fever, do not take medicines to reduce the fever right away. This could cover up a problem that is developing. Notify your health care provider. °· Keep all follow-up appointments with your health care provider. Sickle cell anemia requires regular medical care. °SEEK MEDICAL CARE IF: ° You have a fever. °SEEK IMMEDIATE MEDICAL CARE IF:  °· You feel dizzy or faint.   °· You have new abdominal pain, especially on the left side near the stomach area.   °· You develop a persistent, often uncomfortable and painful penile erection (priapism). If this is not treated immediately it   will lead to impotence.   °· You have numbness your arms or legs or you have a hard time moving them.   °· You have a hard time with speech.   °· You have a fever or persistent symptoms for more than 2-3 days.   °· You have a fever and your symptoms suddenly get worse.   °· You have signs or symptoms of infection.  These include:   °¨ Chills.   °¨ Abnormal tiredness (lethargy).   °¨ Irritability.   °¨ Poor eating.   °¨ Vomiting.   °· You develop pain that is not helped with medicine.   °· You develop shortness of breath. °· You have pain in your chest.   °· You are coughing up pus-like or bloody sputum.   °· You develop a stiff neck. °· Your feet or hands swell or have pain. °· Your abdomen appears bloated. °· You develop joint pain. °MAKE SURE YOU: °· Understand these instructions. °Document Released: 08/07/2005 Document Revised: 09/13/2013 Document Reviewed: 12/09/2012 °[employer]® Patient Information ©2015 [employer]. This information is not intended to replace advice given to you by your health care provider. Make sure you discuss any questions you have with your health care provider. ° °

## 2014-10-14 NOTE — Progress Notes (Signed)
Subjective:    Patient ID: Cassie Montgomery, female    DOB: 1971/04/03, 44 y.o.   MRN: [id number]  HPI [name], a 44 year old female with a history of sickle cell anemia, HbSC presents for sickle cell anemia and medication management. Patient reports that she has daily pain to upper and lower extremities. Current pain intensity is 5/10 described as constant and aching. She states that she last had MS IR 15 mg around 6 am with moderate relief.  Patient also complains of abdominal discomfort. The pain is described as mild and intermittent, and is 2/10 in intensity. Patient has not identified any aggravating or alleviating factors.  Associated symptoms include amenorrhea since April 2016. Patient states that she is sexually active. The patient denies heartburn,  constipation, nausea, vomiting, or diarrhea. .  [name] denies headache, fatigue, chest pain, shortness of breath, fatigue,constipation, nausea, vomiting, or diarrhea.   Past Medical History  Diagnosis Date  . PMDD (premenstrual dysphoric disorder)   . Sickle cell anemia   . Sickle cell disease without crisis 07/15/2011  . Migraine with aura 07/15/2011  . GERD (gastroesophageal reflux disease)   . Asthma in adult     probable  . Depression   . Chronic pain syndrome   . Left arm pain     chronic  . Anxiety    History   Social History  . Marital Status: Single    Spouse Name: N/A  . Number of Children: N/A  . Years of Education: N/A   Occupational History  . Not on file.   Social History Main Topics  . Smoking status: Never Smoker   . Smokeless tobacco: Not on file  . Alcohol Use: No  . Drug Use: No  . Sexual Activity: Yes   Other Topics Concern  . Not on file   Social History Narrative   Uses a cane occasionally.  Does not work.     Review of Systems  Constitutional: Negative.   HENT: Negative.   Eyes: Negative.   Cardiovascular: Negative.   Gastrointestinal: Positive for abdominal pain and  abdominal distention.  Endocrine: Negative.   Genitourinary: Negative.   Musculoskeletal: Positive for myalgias.  Skin: Negative.   Allergic/Immunologic: Negative.   Neurological: Negative.   Hematological: Negative.   Psychiatric/Behavioral: Negative.        Objective:   Physical Exam  Constitutional: She is oriented to person, place, and time. She appears well-developed and well-nourished.  HENT:  Head: Normocephalic and atraumatic.  Right Ear: Hearing, tympanic membrane, external ear and ear canal normal.  Left Ear: Hearing, tympanic membrane, external ear and ear canal normal.  Nose: Nose normal.  Mouth/Throat: Uvula is midline and oropharynx is clear and moist.  Eyes: Conjunctivae, EOM and lids are normal. Pupils are equal, round, and reactive to light. Lids are everted and swept, no foreign bodies found.  Neck: Trachea normal and normal range of motion. Neck supple.  Cardiovascular: Normal rate, regular rhythm, normal heart sounds and intact distal pulses.   Pulmonary/Chest: Effort normal and breath sounds normal.  Abdominal: Soft. Bowel sounds are normal. There is tenderness.  Musculoskeletal: Normal range of motion.  Lymphadenopathy:    She has no cervical adenopathy.    She has no axillary adenopathy.  Neurological: She is alert and oriented to person, place, and time. She has normal reflexes.  Skin: Skin is warm, dry and intact.  Psychiatric: She has a normal mood and affect. Her behavior is normal. Judgment and  thought content normal.         BP 127/80 mmHg  Pulse 77  Temp(Src) 98.3 F (36.8 C) (Oral)  Resp 16  Ht 5\' 7"  (1.702 m)  Wt 232 lb (105.235 kg)  BMI 36.33 kg/m2  LMP 07/20/2014  Assessment & Plan:  1. Sickle cell disease, type [place], without crisis  Will start low dose of Hydrea 500 mg daily after reviewing currently laboratory results. Will check CBC w/differential and reticulocyte count. Will reevaluate patient in 4 weeks for CBC and reticulocyte  count  to discuss increasing hydroxyurea.  We discussed the need for good hydration, monitoring of hydration status, avoidance of heat, cold, stress, and infection triggers. We discussed the risks and benefits of Hydrea, including bone marrow suppression, the possibility of GI upset, skin ulcers, hair thinning, and teratogenicity. The patient was reminded of the need to seek medical attention of any symptoms of bleeding, anemia, or infection. Continue folic acid 1 mg daily to prevent aplastic bone marrow crises.   Cardiac - Routine screening for pulmonary hypertension is not recommended.  Eye - High risk of proliferative retinopathy. Annual eye exam with retinal exam recommended to patient. Patient was referred to opthalmology on 07/27/2014. She missed appointment, will re-schedule   Immunization status - Vaccinations are currently up to date.    Acute and chronic painful episodes - We agreed on weaning Morphine 15 mg to every 6 hours as needed for moderate to severe pain # 90 tablets. We plan on titrating  Medication dose further in 1 month. Will continue Methadone 5 mg daily # 30. Reviewed [place] Substance Reporting system prior to reorder, no inconsistencies noted. Patient will also be referred to pain management.  We discussed that pt is to receive her Schedule II prescriptions only from [place]. Pt is also aware that the prescription history is available to [place] online through the [facility] CSRS. Controlled substance agreement signed 10/14/2014. We reminded Ms. Glantz  that all patients receiving Schedule II narcotics must be seen for follow within one month of prescription being requested. We reviewed the terms of our pain agreement, including the need to keep medicines in a safe locked location away from children or pets, and the need to report excess sedation or constipation, measures to avoid constipation, and policies related to early refills and stolen prescriptions. According to the [place] [facility]  program, we have reviewed details related to analgesia, adverse effects, aberrant behaviors.  Iron overload from chronic transfusion. Patient is not transfusion dependent  Vitamin D deficiency -Will check vitamin D level.   - morphine (MSIR) 15 MG tablet; Take 1 tablet (15 mg total) by mouth every 6 (six) hours as needed for severe pain.  Dispense: 90 tablet; Refill: 0 - CBC with Differential - Reticulocytes - COMPLETE METABOLIC PANEL WITH GFR - methadone (DOLOPHINE) 5 MG tablet; Take 1 tablet (5 mg total) by mouth at bedtime.  Dispense: 30 tablet; Refill: 0 - POCT urinalysis dipstick - hydroxyurea (HYDREA) 500 MG capsule; Take 1 capsule (500 mg total) by mouth daily. May take with food to minimize GI side effects.  Dispense: 30 capsule; Refill: 0  2. Amenorrhea Ms. Appleyard states that she has been having irregular menstrual cycles coming every few months. Discussed perimenopause at length. Patient states that she is not convinced of menopause.  - POCT urine pregnancy  3. Chronic pain syndrome - methadone (DOLOPHINE) 5 MG tablet; Take 1 tablet (5 mg total) by mouth at bedtime.  Dispense: 30 tablet; Refill:  0 - Ambulatory referral to Pain Clinic   4. Abdominal discomfort Ms. Markert denies dysphagia, fatigue, nausea, vomiting or diarrhea. She states that she has periodic stomach discomfort. Patient is status post cholecystectomy. Patient has generalized tenderness on palpation.  - CBC with Differential - COMPLETE METABOLIC PANEL WITH GFR - POCT urine pregnancy - POCT urinalysis dip (device)    Dorena Dew, FNP

## 2014-10-15 LAB — CBC WITH DIFFERENTIAL/PLATELET
BASOS ABS: 0 10*3/uL (ref 0.0–0.1)
Basophils Relative: 0 % (ref 0–1)
EOS ABS: 0.2 10*3/uL (ref 0.0–0.7)
EOS PCT: 2 % (ref 0–5)
HCT: 33.6 % — ABNORMAL LOW (ref 36.0–46.0)
HEMOGLOBIN: 11 g/dL — AB (ref 12.0–15.0)
LYMPHS ABS: 2.6 10*3/uL (ref 0.7–4.0)
Lymphocytes Relative: 29 % (ref 12–46)
MCH: 25.1 pg — ABNORMAL LOW (ref 26.0–34.0)
MCHC: 32.7 g/dL (ref 30.0–36.0)
MCV: 76.5 fL — AB (ref 78.0–100.0)
MONO ABS: 0.5 10*3/uL (ref 0.1–1.0)
MONOS PCT: 6 % (ref 3–12)
NEUTROS ABS: 5.7 10*3/uL (ref 1.7–7.7)
Neutrophils Relative %: 63 % (ref 43–77)
Platelets: 150 10*3/uL (ref 150–400)
RBC: 4.39 MIL/uL (ref 3.87–5.11)
RDW: 18.5 % — ABNORMAL HIGH (ref 11.5–15.5)
WBC: 9 10*3/uL (ref 4.0–10.5)

## 2014-10-15 LAB — RETICULOCYTES
ABS Retic: 184.4 10*3/uL (ref 19.0–186.0)
RBC.: 4.39 MIL/uL (ref 3.87–5.11)
Retic Ct Pct: 4.2 % — ABNORMAL HIGH (ref 0.4–2.3)

## 2014-10-17 ENCOUNTER — Ambulatory Visit: Payer: Medicare Other | Admitting: Internal Medicine

## 2014-10-17 MED ORDER — HYDROXYUREA 500 MG PO CAPS: 500.0000 mg | ORAL_CAPSULE | Freq: Every day | ORAL | Status: DC

## 2014-10-18 ENCOUNTER — Encounter: Payer: Self-pay | Admitting: Family Medicine

## 2014-10-19 ENCOUNTER — Telehealth: Payer: Self-pay

## 2014-10-19 ENCOUNTER — Ambulatory Visit: Payer: Medicare Other | Admitting: Family Medicine

## 2014-10-19 NOTE — Telephone Encounter (Signed)
Patient called this morning stating she has an appointment at 10:30am  but can not walk. states the headache is one of the worst she has ever had. States it is a 10/10 on pain scale. She denies visual changes. She is having nausea and pain in left shoulder. I advised patient to go straight to the er to be evaluated, patient verbalized understanding and said she would call a friend to drive her to the er. We will cancel appointment today.  Thanks!

## 2014-10-22 ENCOUNTER — Other Ambulatory Visit: Payer: Self-pay | Admitting: Internal Medicine

## 2014-10-25 ENCOUNTER — Ambulatory Visit (INDEPENDENT_AMBULATORY_CARE_PROVIDER_SITE_OTHER): Payer: Medicare Other | Admitting: Family Medicine

## 2014-10-25 VITALS — BP 119/77 | HR 88 | Temp 98.3°F | Resp 16 | Ht 67.0 in | Wt 234.0 lb

## 2014-10-25 DIAGNOSIS — D5720 Sickle-cell/Hb-C disease without crisis: Secondary | ICD-10-CM | POA: Diagnosis not present

## 2014-10-25 DIAGNOSIS — G44209 Tension-type headache, unspecified, not intractable: Secondary | ICD-10-CM | POA: Diagnosis not present

## 2014-10-25 MED ORDER — BUTALBITAL-ASPIRIN-CAFFEINE 50-325-40 MG PO CAPS: 1.0000 | ORAL_CAPSULE | ORAL | Status: DC | PRN

## 2014-10-25 NOTE — Patient Instructions (Signed)
Acetaminophen; Aspirin, ASA; Caffeine oral powder What is this medicine? ACETAMINOPHEN; ASPIRIN; CAFFEINE (a set a MEE noe fen; AS pir in; KAF een) is a pain reliever. It is used to treat mild aches and pains. This medicine may help with arthritis, colds, headache (including migraine), muscle aches, menstrual cramps, sinusitis, and toothache. This medicine may be used for other purposes; ask your health care provider or pharmacist if you have questions. COMMON BRAND NAME(S): Goody's Cool Medtronic, Goody's Extra Strength Headache What should I tell my health care provider before I take this medicine? They need to know if you have any of these conditions: -anemia -anxiety or panic attacks -asthma -bleeding problems -child with chickenpox, the flu, or other viral infection -diabetes -gout -heart disease -high blood pressure -if you often drink alcohol -kidney disease -liver disease -low level of vitamin K -lupus -smoke tobacco -stomach ulcers or other problems -trouble sleeping -an unusual or allergic reaction to acetaminophen, aspirin, caffeine, other medicines, foods, dyes, or preservatives -pregnant or trying to get pregnant -breast-feeding How should I use this medicine? Take this medicine by mouth. Place one packet of powder on tongue and follow with plenty of liquid, or stir powder into a glass of water or other liquid. Follow the directions on the package or prescription label. You can take this medicine with or without food. If it upsets your stomach, take it with food. Take your medicine at regular intervals. Do not take your medicine more often than directed. Talk to your pediatrician regarding the use of this medicine in children. Special care may be needed. Patients over 90 years old may have a stronger reaction and need a smaller dose. Overdosage: If you think you have taken too much of this medicine contact a poison control center or emergency room at  once. NOTE: This medicine is only for you. Do not share this medicine with others. What if I miss a dose? If you miss a dose, take it as soon as you can. If it is almost time for your next dose, take only that dose. Do not take double or extra doses. What may interact with this medicine? Do not take this medicine with any of the following medications: -MAOIs like Carbex, Eldepryl, Marplan, Nardil, and Parnate -methotrexate -other medicines with acetaminophen -probenecid This medicine may also interact with the following medications: -alcohol -alendronate -bismuth subsalicylate -clozapine -flavocoxid -grapefruit juice -herbal supplements like feverfew, garlic, ginger, ginkgo biloba, horse chestnut -isoniazid -lithium -medicines for diabetes or glaucoma like acetazolamide, methazolamide -medicines for gout -medicines that stimulate or keep you awake -medicines that treat or prevent blood clots like enoxaparin, heparin, ticlopidine, warfarin -NSAIDs, medicines for pain and inflammation, like ibuprofen or naproxen -other aspirin and aspirin-like medicines -some cough and cold medicines like pseudoephedrine -varicella live vaccine This list may not describe all possible interactions. Give your health care provider a list of all the medicines, herbs, non-prescription drugs, or dietary supplements you use. Also tell them if you smoke, drink alcohol, or use illegal drugs. Some items may interact with your medicine. What should I watch for while using this medicine? Tell your doctor or health care professional if the pain lasts more than 10 days, if it gets worse, or if there is a new or different kind of pain. Tell your doctor if you see redness or swelling. If you are treating a fever, check with your doctor if the fever that lasts for more than 3 days. Do not take Tylenol (acetaminophen) or medicines that  have acetaminophen with this medicine. Too much acetaminophen can be very dangerous.  Always read medicine labels carefully. Report any possible overdose to your doctor or health care professional right away, even if there are no symptoms. The effects of extra doses may not be seen for many days. This medicine can irritate your stomach or cause bleeding problems. Do not smoke cigarettes or drink alcohol. Do not lie down for 30 minutes after taking this medicine to prevent irritation to your throat. If you are scheduled for any medical or dental procedure, tell your healthcare provider that you are taking this medicine. You may need to stop taking this medicine before the procedure. Do not take this medicine close to bedtime. It may prevent you from sleeping. This medicine may be used to treat migraines. If you take migraine medicines for 10 or more days a month, your migraines may get worse. Keep a diary of headache days and medicine use. Contact your healthcare professional if your migraine attacks occur more frequently. What side effects may I notice from receiving this medicine? Side effects that you should report to your doctor or health care professional as soon as possible: -allergic reactions like skin rash, itching or hives, swelling of the face, lips, or tongue -breathing problems -fast, irregular heartbeat -feeling faint or lightheaded, falls -fever or sore throat -pain on swallowing -ringing in the ears or trouble hearing -signs and symptoms of bleeding such as bloody or black, tarry stools; red or dark-brown urine; spitting up blood or brown material that looks like coffee grounds; red spots on the skin; unusual bruising or bleeding from the eye, gums, or nose -trouble passing urine or change in the amount of urine -unusually weak or tired -yellowing of the eyes or skin Side effects that usually do not require medical attention (report to your doctor or health care professional if they continue or are bothersome): -diarrhea -headache -nausea, vomiting -passing urine  more often -trouble sleeping This list may not describe all possible side effects. Call your doctor for medical advice about side effects. You may report side effects to FDA at 1-800-FDA-1088. Where should I keep my medicine? Keep out of the reach of children. Store at room temperature between 15 and 30 degrees C (59 and 86 degrees F). Keep container tightly closed. Throw away any unused medicine after the expiration date. NOTE: This sheet is a summary. It may not cover all possible information. If you have questions about this medicine, talk to your doctor, pharmacist, or health care provider.  2015, [employer]. (2012-12-29 11:02:11) Headaches, Frequently Asked Questions MIGRAINE HEADACHES Q: What is migraine? What causes it? How can I treat it? A: Generally, migraine headaches begin as a dull ache. Then they develop into a constant, throbbing, and pulsating pain. You may experience pain at the temples. You may experience pain at the front or back of one or both sides of the head. The pain is usually accompanied by a combination of:  Nausea.  Vomiting.  Sensitivity to light and noise. Some people (about 15%) experience an aura (see below) before an attack. The cause of migraine is believed to be chemical reactions in the brain. Treatment for migraine may include over-the-counter or prescription medications. It may also include self-help techniques. These include relaxation training and biofeedback.  Q: What is an aura? A: About 15% of people with migraine get an "aura". This is a sign of neurological symptoms that occur before a migraine headache. You may see wavy or jagged lines,  dots, or flashing lights. You might experience tunnel vision or blind spots in one or both eyes. The aura can include visual or auditory hallucinations (something imagined). It may include disruptions in smell (such as strange odors), taste or touch. Other symptoms include:  Numbness.  A "pins and  needles" sensation.  Difficulty in recalling or speaking the correct word. These neurological events may last as long as 60 minutes. These symptoms will fade as the headache begins. Q: What is a trigger? A: Certain physical or environmental factors can lead to or "trigger" a migraine. These include:  Foods.  Hormonal changes.  Weather.  Stress. It is important to remember that triggers are different for everyone. To help prevent migraine attacks, you need to figure out which triggers affect you. Keep a headache diary. This is a good way to track triggers. The diary will help you talk to your healthcare professional about your condition. Q: Does weather affect migraines? A: Bright sunshine, hot, humid conditions, and drastic changes in barometric pressure may lead to, or "trigger," a migraine attack in some people. But studies have shown that weather does not act as a trigger for everyone with migraines. Q: What is the link between migraine and hormones? A: Hormones start and regulate many of your body's functions. Hormones keep your body in balance within a constantly changing environment. The levels of hormones in your body are unbalanced at times. Examples are during menstruation, pregnancy, or menopause. That can lead to a migraine attack. In fact, about three quarters of all women with migraine report that their attacks are related to the menstrual cycle.  Q: Is there an increased risk of stroke for migraine sufferers? A: The likelihood of a migraine attack causing a stroke is very remote. That is not to say that migraine sufferers cannot have a stroke associated with their migraines. In persons under age 61, the most common associated factor for stroke is migraine headache. But over the course of a person's normal life span, the occurrence of migraine headache may actually be associated with a reduced risk of dying from cerebrovascular disease due to stroke.  Q: What are acute medications  for migraine? A: Acute medications are used to treat the pain of the headache after it has started. Examples over-the-counter medications, NSAIDs, ergots, and triptans.  Q: What are the triptans? A: Triptans are the newest class of abortive medications. They are specifically targeted to treat migraine. Triptans are vasoconstrictors. They moderate some chemical reactions in the brain. The triptans work on receptors in your brain. Triptans help to restore the balance of a neurotransmitter called serotonin. Fluctuations in levels of serotonin are thought to be a main cause of migraine.  Q: Are over-the-counter medications for migraine effective? A: Over-the-counter, or "OTC," medications may be effective in relieving mild to moderate pain and associated symptoms of migraine. But you should see your caregiver before beginning any treatment regimen for migraine.  Q: What are preventive medications for migraine? A: Preventive medications for migraine are sometimes referred to as "prophylactic" treatments. They are used to reduce the frequency, severity, and length of migraine attacks. Examples of preventive medications include antiepileptic medications, antidepressants, beta-blockers, calcium channel blockers, and NSAIDs (nonsteroidal anti-inflammatory drugs). Q: Why are anticonvulsants used to treat migraine? A: During the past few years, there has been an increased interest in antiepileptic drugs for the prevention of migraine. They are sometimes referred to as "anticonvulsants". Both epilepsy and migraine may be caused by similar reactions in the brain.  Q: Why are antidepressants used to treat migraine? A: Antidepressants are typically used to treat people with depression. They may reduce migraine frequency by regulating chemical levels, such as serotonin, in the brain.  Q: What alternative therapies are used to treat migraine? A: The term "alternative therapies" is often used to describe treatments  considered outside the scope of conventional Western medicine. Examples of alternative therapy include acupuncture, acupressure, and yoga. Another common alternative treatment is herbal therapy. Some herbs are believed to relieve headache pain. Always discuss alternative therapies with your caregiver before proceeding. Some herbal products contain arsenic and other toxins. TENSION HEADACHES Q: What is a tension-type headache? What causes it? How can I treat it? A: Tension-type headaches occur randomly. They are often the result of temporary stress, anxiety, fatigue, or anger. Symptoms include soreness in your temples, a tightening band-like sensation around your head (a "vice-like" ache). Symptoms can also include a pulling feeling, pressure sensations, and contracting head and neck muscles. The headache begins in your forehead, temples, or the back of your head and neck. Treatment for tension-type headache may include over-the-counter or prescription medications. Treatment may also include self-help techniques such as relaxation training and biofeedback. CLUSTER HEADACHES Q: What is a cluster headache? What causes it? How can I treat it? A: Cluster headache gets its name because the attacks come in groups. The pain arrives with little, if any, warning. It is usually on one side of the head. A tearing or bloodshot eye and a runny nose on the same side of the headache may also accompany the pain. Cluster headaches are believed to be caused by chemical reactions in the brain. They have been described as the most severe and intense of any headache type. Treatment for cluster headache includes prescription medication and oxygen. SINUS HEADACHES Q: What is a sinus headache? What causes it? How can I treat it? A: When a cavity in the bones of the face and skull (a sinus) becomes inflamed, the inflammation will cause localized pain. This condition is usually the result of an allergic reaction, a tumor, or an  infection. If your headache is caused by a sinus blockage, such as an infection, you will probably have a fever. An x-ray will confirm a sinus blockage. Your caregiver's treatment might include antibiotics for the infection, as well as antihistamines or decongestants.  REBOUND HEADACHES Q: What is a rebound headache? What causes it? How can I treat it? A: A pattern of taking acute headache medications too often can lead to a condition known as "rebound headache." A pattern of taking too much headache medication includes taking it more than 2 days per week or in excessive amounts. That means more than the label or a caregiver advises. With rebound headaches, your medications not only stop relieving pain, they actually begin to cause headaches. Doctors treat rebound headache by tapering the medication that is being overused. Sometimes your caregiver will gradually substitute a different type of treatment or medication. Stopping may be a challenge. Regularly overusing a medication increases the potential for serious side effects. Consult a caregiver if you regularly use headache medications more than 2 days per week or more than the label advises. ADDITIONAL QUESTIONS AND ANSWERS Q: What is biofeedback? A: Biofeedback is a self-help treatment. Biofeedback uses special equipment to monitor your body's involuntary physical responses. Biofeedback monitors:  Breathing.  Pulse.  Heart rate.  Temperature.  Muscle tension.  Brain activity. Biofeedback helps you refine and perfect your relaxation exercises. You learn  to control the physical responses that are related to stress. Once the technique has been mastered, you do not need the equipment any more. Q: Are headaches hereditary? A: Four out of five (80%) of people that suffer report a family history of migraine. Scientists are not sure if this is genetic or a family predisposition. Despite the uncertainty, a child has a 50% chance of having migraine if  one parent suffers. The child has a 75% chance if both parents suffer.  Q: Can children get headaches? A: By the time they reach high school, most young people have experienced some type of headache. Many safe and effective approaches or medications can prevent a headache from occurring or stop it after it has begun.  Q: What type of doctor should I see to diagnose and treat my headache? A: Start with your primary caregiver. Discuss his or her experience and approach to headaches. Discuss methods of classification, diagnosis, and treatment. Your caregiver may decide to recommend you to a headache specialist, depending upon your symptoms or other physical conditions. Having diabetes, allergies, etc., may require a more comprehensive and inclusive approach to your headache. The [employer] will provide, upon request, a list of [facility] physician members in your state. Document Released: 07/20/2003 Document Revised: 07/22/2011 Document Reviewed: 12/28/2007 [facility] Patient Information 2015 [place], [place]. This information is not intended to replace advice given to you by your health care provider. Make sure you discuss any questions you have with your health care provider.

## 2014-10-25 NOTE — Progress Notes (Signed)
Subjective:    Patient ID: Mila Homer, female    DOB: [birth year], 44 y.o.   MRN: [id number]  HPI  Ms. Mercedees Convery, a 44 year old patient with a history of sickle cell anemia, HbSC  presents with intermittent headache pain for Patient presents complaining of a headache and increased pain consistent with sickle cell anemia. Patient started her menstrual cycle, which triggered a tension headache. Patient states that menstrual cycle was very heavy and she has not a cycle in several months.  Patient states that headache is at the top of her forehead. She states that she has been applying a heating pad to her forehead with minimal relief. She reports that headache is often worsened after utilizing heating pad.   Ms. Hink is also complaining of increased pain to upper and lower extremities and low back, which is consistent with sickle cell anemia. She states that current pain intensity is 6/10, described as constant and throbbing. Patient attributes current crisis to a heavy menstrual cycle. Patient states that she had Morphine IR 15 mg around 9 am with minimal relief.   Ms. Macknight denies chest pains, shortness of breath, constipation, nausea, vomiting, or diarrhea.    Past Medical History  Diagnosis Date  . PMDD (premenstrual dysphoric disorder)   . Sickle cell anemia   . Sickle cell disease without crisis 07/15/2011  . Migraine with aura 07/15/2011  . GERD (gastroesophageal reflux disease)   . Asthma in adult     probable  . Depression   . Chronic pain syndrome   . Left arm pain     chronic  . Anxiety    History   Social History  . Marital Status: Single    Spouse Name: N/A  . Number of Children: N/A  . Years of Education: N/A   Occupational History  . Not on file.   Social History Main Topics  . Smoking status: Never Smoker   . Smokeless tobacco: Not on file  . Alcohol Use: No  . Drug Use: No  . Sexual Activity: Yes   Other Topics Concern  . Not on file    Social History Narrative   Uses a cane occasionally.  Does not work.     Immunization History  Administered Date(s) Administered  . Influenza,inj,Quad PF,36+ Mos 02/15/2013, 01/19/2014, 06/13/2014  . Pneumococcal Polysaccharide-23 06/11/2013, 06/13/2014  . Tdap 06/11/2013   Review of Systems  Constitutional: Negative.   Eyes: Negative.  Negative for photophobia and visual disturbance.  Respiratory: Negative.  Negative for chest tightness.   Cardiovascular: Negative.   Gastrointestinal: Negative.   Endocrine: Negative.   Genitourinary: Negative.   Musculoskeletal: Negative.   Skin: Negative.   Allergic/Immunologic: Negative.   Neurological: Positive for headaches. Negative for dizziness, tremors and speech difficulty.  Psychiatric/Behavioral: Negative.  The patient is not nervous/anxious.        Objective:   Physical Exam  Constitutional: She is oriented to person, place, and time. She appears well-developed and well-nourished.  HENT:  Head: Normocephalic and atraumatic.  Right Ear: External ear normal.  Left Ear: External ear normal.  Mouth/Throat: Oropharynx is clear and moist.  Eyes: Conjunctivae, EOM and lids are normal. Pupils are equal, round, and reactive to light.  Fundoscopic exam:      The right eye shows no exudate. The right eye shows red reflex.       The left eye shows no exudate. The left eye shows red reflex.  Neck: Normal range of motion. Neck  supple.  Cardiovascular: Normal rate, regular rhythm, normal heart sounds and intact distal pulses.   Pulmonary/Chest: Effort normal and breath sounds normal.  Abdominal: Soft. Bowel sounds are normal.  Neurological: She is alert and oriented to person, place, and time. She has normal strength and normal reflexes. She displays no tremor. No cranial nerve deficit or sensory deficit. She exhibits normal muscle tone. She displays a negative Romberg sign. Coordination and gait normal.  Reflex Scores:      Tricep  reflexes are 2+ on the right side and 2+ on the left side.      Bicep reflexes are 2+ on the right side and 2+ on the left side.      Brachioradialis reflexes are 2+ on the right side and 2+ on the left side.      Patellar reflexes are 2+ on the right side and 2+ on the left side.      Achilles reflexes are 2+ on the right side and 2+ on the left side. Skin: Skin is warm and dry.  Psychiatric: She has a normal mood and affect. Her behavior is normal. Judgment and thought content normal.         BP 119/77 mmHg  Pulse 88  Temp(Src) 98.3 F (36.8 C) (Oral)  Resp 16  Ht 5\' 7"  (1.702 m)  Wt 234 lb (106.142 kg)  BMI 36.64 kg/m2  LMP 07/15/2014 Assessment & Plan:  1. Tension-type headache, not intractable, unspecified chronicity pattern Ms. Demattia states that headache started at the onset of a heavy menstrual period. Patient is perimenopausal and menstrual cycles are intermittent. She has not had a menstrual cycle since April 2016.  She states that headache is primarily to frontal lobe behind eyes. She also reports that she is sensitive to light, yet denies phonophobia. Neuro exam negative. No signs of facial weakness or drooping. Pupils equal, round, and intact. Will start Fiorinal every 4 hours as needed for headache pain. Recommend that patient refrain from using heating pad on forehead. Rest in a dark, quit room until headache subsides. Continue to hydrate frequently.   - butalbital-aspirin-caffeine (FIORINAL) 50-325-40 MG per capsule; Take 1 capsule by mouth every 4 (four) hours as needed for headache.  Dispense: 30 capsule; Refill: 0  2. Sickle cell disease, type [place], without crisis Patient did not start low dose of Hydrea 500 mg daily due to lingering headache.  Will reevaluate patient 4 weeks after she starts Hydrea therapy, and will check CBC and reticulocyte count. We will discuss increasing hydroxyurea at that time. We discussed the need for good hydration, monitoring of hydration  status, avoidance of heat, cold, stress, and infection triggers. I reemphasized  the risks and benefits of Hydrea, including bone marrow suppression, the possibility of GI upset, skin ulcers, hair thinning, and teratogenicity. The patient was reminded of the need to seek medical attention of any symptoms of bleeding, anemia, or infection, while on hydroxyurea. Ms. Richburg continues folic acid 1 mg daily to prevent aplastic bone marrow crises.  On 10/14/2014, agreed on weaning Morphine 15 mg to every 6 hours as needed for moderate to severe pain # 90 tablets. Patient states that pain intensity to back and lower extremities has increased over the past 2-3 days. We discussed that patient will take Morphine 15 mg every 4 hours due to increased pain intensity following menstrual cycle. We will discuss  titrating Medication dose further in 1 month. Patient was referred to pain management during appointment on 10/14/2014.  Will continue  Methadone 5 mg HS.  Reviewed [place] Substance Reporting system prior to reorder, no inconsistencies noted. Patient will also be referred to pain management.   We discussed that pt is to receive her Schedule II prescriptions only from [place]. Pt is also aware that the prescription history is available to [place] online through the [facility] CSRS. Controlled substance agreement signed 10/14/2014. We reminded Ms. Gravelle that all patients receiving Schedule II narcotics must be seen for follow within one month of prescription being requested. We reviewed the terms of our pain agreement, including the need to keep medicines in a safe locked location away from children or pets, and the need to report excess sedation or constipation, measures to avoid constipation, and policies related to early refills and stolen prescriptions. According to the [place] [facility] program, we have reviewed details related to analgesia, adverse effects, aberrant behaviors.   RTC: Follow up as scheduled  Dorena Dew,  FNP

## 2014-10-26 ENCOUNTER — Encounter: Payer: Self-pay | Admitting: Family Medicine

## 2014-10-26 DIAGNOSIS — R519 Headache, unspecified: Secondary | ICD-10-CM | POA: Insufficient documentation

## 2014-10-26 DIAGNOSIS — R51 Headache: Secondary | ICD-10-CM

## 2014-10-31 ENCOUNTER — Telehealth: Payer: Self-pay

## 2014-10-31 DIAGNOSIS — D5720 Sickle-cell/Hb-C disease without crisis: Secondary | ICD-10-CM

## 2014-10-31 MED ORDER — IBUPROFEN 600 MG PO TABS: 600.0000 mg | ORAL_TABLET | Freq: Three times a day (TID) | ORAL | Status: DC | PRN

## 2014-10-31 NOTE — Telephone Encounter (Signed)
Refill request for Morphine 15mg  and Ibuprofen 600mg . LOV 10/25/2014. Patient states she has 2 more days worth of medication. Please advise. Thanks!

## 2014-11-02 ENCOUNTER — Other Ambulatory Visit: Payer: Self-pay | Admitting: Internal Medicine

## 2014-11-02 DIAGNOSIS — D5720 Sickle-cell/Hb-C disease without crisis: Secondary | ICD-10-CM

## 2014-11-02 MED ORDER — MORPHINE SULFATE 15 MG PO TABS: 15.0000 mg | ORAL_TABLET | Freq: Four times a day (QID) | ORAL | Status: DC | PRN

## 2014-11-02 MED ORDER — IBUPROFEN 600 MG PO TABS: 600.0000 mg | ORAL_TABLET | Freq: Three times a day (TID) | ORAL | Status: DC | PRN

## 2014-11-10 ENCOUNTER — Ambulatory Visit: Payer: Medicare Other | Admitting: Family Medicine

## 2014-11-10 ENCOUNTER — Telehealth: Payer: Self-pay | Admitting: Family Medicine

## 2014-11-10 NOTE — Telephone Encounter (Signed)
Refill request for methadone 5LOV 10/25/2014. Please advise. Thanks!

## 2014-11-11 ENCOUNTER — Other Ambulatory Visit: Payer: Self-pay | Admitting: Family Medicine

## 2014-11-11 DIAGNOSIS — G894 Chronic pain syndrome: Secondary | ICD-10-CM

## 2014-11-11 DIAGNOSIS — D5720 Sickle-cell/Hb-C disease without crisis: Secondary | ICD-10-CM

## 2014-11-11 MED ORDER — METHADONE HCL 5 MG PO TABS: 5.0000 mg | ORAL_TABLET | Freq: Every day | ORAL | Status: DC

## 2014-11-12 ENCOUNTER — Other Ambulatory Visit: Payer: Self-pay | Admitting: Internal Medicine

## 2014-11-18 ENCOUNTER — Telehealth (HOSPITAL_COMMUNITY): Payer: Self-pay

## 2014-11-18 NOTE — Telephone Encounter (Signed)
After speaking with MD, based on patients symptoms she was advised to report to the ER. Patient verbalized understanding

## 2014-11-18 NOTE — Telephone Encounter (Signed)
Patient called wanting to come to day hospital for treatment. Complaining of pain to shoulders and arms and wrist. Rates pain 7/10. Also complaining of wheezing and has been using her inhaler, She is experiencing some abdominal pain 5/10. Last pain medication taken at 1130; 15mg  Morphine PO.

## 2014-11-19 ENCOUNTER — Emergency Department (HOSPITAL_COMMUNITY)
Admission: EM | Admit: 2014-11-19 | Discharge: 2014-11-19 | Disposition: A | Discharge status: Home / Self Care | Payer: Medicare Other | Attending: Emergency Medicine | Admitting: Emergency Medicine

## 2014-11-19 ENCOUNTER — Emergency Department (HOSPITAL_COMMUNITY): Payer: Medicare Other

## 2014-11-19 ENCOUNTER — Encounter (HOSPITAL_COMMUNITY): Payer: Self-pay | Admitting: Emergency Medicine

## 2014-11-19 DIAGNOSIS — G43109 Migraine with aura, not intractable, without status migrainosus: Secondary | ICD-10-CM | POA: Diagnosis not present

## 2014-11-19 DIAGNOSIS — Z8742 Personal history of other diseases of the female genital tract: Secondary | ICD-10-CM | POA: Diagnosis not present

## 2014-11-19 DIAGNOSIS — Z8719 Personal history of other diseases of the digestive system: Secondary | ICD-10-CM | POA: Insufficient documentation

## 2014-11-19 DIAGNOSIS — D5700 Hb-SS disease with crisis, unspecified: Secondary | ICD-10-CM | POA: Diagnosis not present

## 2014-11-19 DIAGNOSIS — F419 Anxiety disorder, unspecified: Secondary | ICD-10-CM | POA: Insufficient documentation

## 2014-11-19 DIAGNOSIS — J45901 Unspecified asthma with (acute) exacerbation: Secondary | ICD-10-CM | POA: Insufficient documentation

## 2014-11-19 DIAGNOSIS — Z79899 Other long term (current) drug therapy: Secondary | ICD-10-CM | POA: Diagnosis not present

## 2014-11-19 DIAGNOSIS — Z8739 Personal history of other diseases of the musculoskeletal system and connective tissue: Secondary | ICD-10-CM | POA: Diagnosis not present

## 2014-11-19 DIAGNOSIS — D571 Sickle-cell disease without crisis: Secondary | ICD-10-CM | POA: Diagnosis not present

## 2014-11-19 DIAGNOSIS — D57819 Other sickle-cell disorders with crisis, unspecified: Secondary | ICD-10-CM | POA: Diagnosis not present

## 2014-11-19 DIAGNOSIS — G894 Chronic pain syndrome: Secondary | ICD-10-CM | POA: Diagnosis not present

## 2014-11-19 DIAGNOSIS — J9811 Atelectasis: Secondary | ICD-10-CM | POA: Diagnosis not present

## 2014-11-19 LAB — CBC WITH DIFFERENTIAL/PLATELET
BASOS ABS: 0 10*3/uL (ref 0.0–0.1)
Basophils Relative: 0 % (ref 0–1)
Eosinophils Absolute: 0.3 10*3/uL (ref 0.0–0.7)
Eosinophils Relative: 4 % (ref 0–5)
HCT: 28.5 % — ABNORMAL LOW (ref 36.0–46.0)
HEMOGLOBIN: 9.7 g/dL — AB (ref 12.0–15.0)
LYMPHS PCT: 25 % (ref 12–46)
Lymphs Abs: 1.7 10*3/uL (ref 0.7–4.0)
MCH: 25.1 pg — ABNORMAL LOW (ref 26.0–34.0)
MCHC: 34 g/dL (ref 30.0–36.0)
MCV: 73.8 fL — ABNORMAL LOW (ref 78.0–100.0)
MONOS PCT: 6 % (ref 3–12)
Monocytes Absolute: 0.4 10*3/uL (ref 0.1–1.0)
NEUTROS ABS: 4.4 10*3/uL (ref 1.7–7.7)
Neutrophils Relative %: 65 % (ref 43–77)
Platelets: 107 10*3/uL — ABNORMAL LOW (ref 150–400)
RBC: 3.86 MIL/uL — ABNORMAL LOW (ref 3.87–5.11)
RDW: 16.9 % — AB (ref 11.5–15.5)
WBC: 6.8 10*3/uL (ref 4.0–10.5)

## 2014-11-19 LAB — BASIC METABOLIC PANEL
ANION GAP: 8 (ref 5–15)
BUN: 6 mg/dL (ref 6–20)
CO2: 26 mmol/L (ref 22–32)
CREATININE: 0.63 mg/dL (ref 0.44–1.00)
Calcium: 9 mg/dL (ref 8.9–10.3)
Chloride: 106 mmol/L (ref 101–111)
GFR calc Af Amer: >60 mL/min (ref >60)
GFR calc non Af Amer: >60 mL/min (ref >60)
Glucose, Bld: 132 mg/dL — ABNORMAL HIGH (ref 65–99)
Potassium: 4.2 mmol/L (ref 3.5–5.1)
Sodium: 140 mmol/L (ref 135–145)

## 2014-11-19 LAB — RETICULOCYTES
RBC.: 3.86 MIL/uL — ABNORMAL LOW (ref 3.87–5.11)
Retic Count, Absolute: 208.4 10*3/uL — ABNORMAL HIGH (ref 19.0–186.0)
Retic Ct Pct: 5.4 % — ABNORMAL HIGH (ref 0.4–3.1)

## 2014-11-19 MED ORDER — IPRATROPIUM-ALBUTEROL 0.5-2.5 (3) MG/3ML IN SOLN
3.0000 mL | RESPIRATORY_TRACT | Status: DC
  Administered 2014-11-19: 3 mL via RESPIRATORY_TRACT
  Filled 2014-11-19: qty 3

## 2014-11-19 MED ORDER — HYDROMORPHONE HCL 1 MG/ML IJ SOLN
1.0000 mg | Freq: Once | INTRAMUSCULAR | Status: AC
  Administered 2014-11-19: 1 mg via INTRAVENOUS
  Filled 2014-11-19: qty 1

## 2014-11-19 MED ORDER — HYDROMORPHONE HCL 1 MG/ML IJ SOLN
1.0000 mg | INTRAMUSCULAR | Status: AC
  Administered 2014-11-19 (×2): 1 mg via INTRAVENOUS
  Filled 2014-11-19 (×2): qty 1

## 2014-11-19 NOTE — ED Notes (Signed)
Pt reports hx asthma and she repots using her inhaler 3hrs ago.

## 2014-11-19 NOTE — ED Notes (Signed)
Reports hx of sickle cell crisis, pt reports SOB that started on yesterday pt reports taken Ibprofen and morphine and symptoms are getting worse. Pt denies N/V.

## 2014-11-19 NOTE — Discharge Instructions (Signed)
Return to ED for worsening pain or any difficulties breathing. Follow up with your doctors for further evaluation and management of your symptoms.  Sickle Cell Anemia, Adult Sickle cell anemia is a condition in which red blood cells have an abnormal "sickle" shape. This abnormal shape shortens the cells' life span, which results in a lower than normal concentration of red blood cells in the blood. The sickle shape also causes the cells to clump together and block free blood flow through the blood vessels. As a result, the tissues and organs of the body do not receive enough oxygen. Sickle cell anemia causes organ damage and pain and increases the risk of infection. CAUSES  Sickle cell anemia is a genetic disorder. Those who receive two copies of the gene have the condition, and those who receive one copy have the trait. RISK FACTORS The sickle cell gene is most common in people whose families originated in [place]. Other areas of the globe where sickle cell trait occurs include the [place], [place] and [place], and the [place].  SIGNS AND SYMPTOMS  Pain, especially in the extremities, back, chest, or abdomen (common). The pain may start suddenly or may develop following an illness, especially if there is dehydration. Pain can also occur due to overexertion or exposure to extreme temperature changes.  Frequent severe bacterial infections, especially certain types of pneumonia and meningitis.  Pain and swelling in the hands and feet.  Decreased activity.   Loss of appetite.   Change in behavior.  Headaches.  Seizures.  Shortness of breath or difficulty breathing.  Vision changes.  Skin ulcers. Those with the trait may not have symptoms or they may have mild symptoms.  DIAGNOSIS  Sickle cell anemia is diagnosed with blood tests that demonstrate the genetic trait. It is often diagnosed during the newborn period, due to mandatory testing nationwide. A  variety of blood tests, X-rays, CT scans, MRI scans, ultrasounds, and lung function tests may also be done to monitor the condition. TREATMENT  Sickle cell anemia may be treated with:  Medicines. You may be given pain medicines, antibiotic medicines (to treat and prevent infections) or medicines to increase the production of certain types of hemoglobin.  Fluids.  Oxygen.  Blood transfusions. HOME CARE INSTRUCTIONS   Drink enough fluid to keep your urine clear or pale yellow. Increase your fluid intake in hot weather and during exercise.  Do not smoke. Smoking lowers oxygen levels in the blood.   Only take over-the-counter or prescription medicines for pain, fever, or discomfort as directed by your health care provider.  Take antibiotics as directed by your health care provider. Make sure you finish them it even if you start to feel better.   Take supplements as directed by your health care provider.   Consider wearing a medical alert bracelet. This tells anyone caring for you in an emergency of your condition.   When traveling, keep your medical information, health care provider's names, and the medicines you take with you at all times.   If you develop a fever, do not take medicines to reduce the fever right away. This could cover up a problem that is developing. Notify your health care provider.  Keep all follow-up appointments with your health care provider. Sickle cell anemia requires regular medical care. SEEK MEDICAL CARE IF: You have a fever. SEEK IMMEDIATE MEDICAL CARE IF:   You feel dizzy or faint.   You have new abdominal pain, especially on the left side  near the stomach area.   You develop a persistent, often uncomfortable and painful penile erection (priapism). If this is not treated immediately it will lead to impotence.   You have numbness your arms or legs or you have a hard time moving them.   You have a hard time with speech.   You have a fever  or persistent symptoms for more than 2-3 days.   You have a fever and your symptoms suddenly get worse.   You have signs or symptoms of infection. These include:   Chills.   Abnormal tiredness (lethargy).   Irritability.   Poor eating.   Vomiting.   You develop pain that is not helped with medicine.   You develop shortness of breath.  You have pain in your chest.   You are coughing up pus-like or bloody sputum.   You develop a stiff neck.  Your feet or hands swell or have pain.  Your abdomen appears bloated.  You develop joint pain. MAKE SURE YOU:  Understand these instructions. Document Released: 08/07/2005 Document Revised: 09/13/2013 Document Reviewed: 12/09/2012 [facility] Patient Information 2015 [place], [place]. This information is not intended to replace advice given to you by your health care provider. Make sure you discuss any questions you have with your health care provider.

## 2014-11-19 NOTE — ED Provider Notes (Signed)
CSN: [id number]     Arrival date & time 11/19/14  1219 History   First MD Initiated Contact with Patient 11/19/14 1237     Chief Complaint  Patient presents with  . Sickle Cell Pain Crisis  . Wheezing     (Consider location/radiation/quality/duration/timing/severity/associated sxs/prior Treatment) HPI Cassie Montgomery is a 44 y.o. female with a history of sickle cell anemia comes in for evaluation of pain crisis. Patient states her current pain crisis started yesterday afternoon and follows her typical pattern of pain in her arms. She also reports associated wheezing and a mild cough which is also typical for her. She denies any fevers, chills. She has tried taking ibuprofen and morphine at home without relief of her symptoms. No other aggravating or modifying factors.  Past Medical History  Diagnosis Date  . PMDD (premenstrual dysphoric disorder)   . Sickle cell anemia   . Sickle cell disease without crisis 07/15/2011  . Migraine with aura 07/15/2011  . GERD (gastroesophageal reflux disease)   . Asthma in adult     probable  . Depression   . Chronic pain syndrome   . Left arm pain     chronic  . Anxiety    Past Surgical History  Procedure Laterality Date  . Cesarean section      2   Family History  Problem Relation Age of Onset  . Hypertension Mother   . Asthma Son   . Asthma Son   . Asthma Daughter    History  Substance Use Topics  . Smoking status: Never Smoker   . Smokeless tobacco: Not on file  . Alcohol Use: No   OB History    No data available     Review of Systems A 10 point review of systems was completed and was negative except for pertinent positives and negatives as mentioned in the history of present illness     Allergies  Sulfa antibiotics  Home Medications   Prior to Admission medications   Medication Sig Start Date End Date Taking? Authorizing Provider  albuterol (PROVENTIL HFA;VENTOLIN HFA) 108 (90 BASE) MCG/ACT inhaler Inhale 2 puffs into the  lungs every 6 (six) hours as needed for wheezing or shortness of breath (wheezing).    Yes Historical Provider, MD  busPIRone (BUSPAR) 7.5 MG tablet TAKE 1 TABLET BY MOUTH 3 TIMES A DAY 11/11/14  Yes Micheline Chapman, NP  butalbital-aspirin-caffeine [facility]) [phone] MG per capsule Take 1 capsule by mouth every 4 (four) hours as needed for headache. 10/25/14  Yes Dorena Dew, FNP  fluticasone (FLONASE) 50 MCG/ACT nasal spray Place 2 sprays into both nostrils daily as needed for allergies or rhinitis (allergies).    Yes Historical Provider, MD  folic acid (FOLVITE) 1 MG tablet Take 1 tablet (1 mg total) by mouth every morning. 05/26/14  Yes [name], MD  hydroxyurea (HYDREA) 500 MG capsule Take 1 capsule (500 mg total) by mouth daily. May take with food to minimize GI side effects. 10/17/14  Yes Dorena Dew, FNP  ibuprofen (ADVIL,MOTRIN) 600 MG tablet Take 1 tablet (600 mg total) by mouth every 8 (eight) hours as needed for moderate pain. 11/02/14  Yes Tresa Garter, MD  methadone (DOLOPHINE) 5 MG tablet Take 1 tablet (5 mg total) by mouth at bedtime. 11/11/14  Yes Micheline Chapman, NP  morphine (MSIR) 15 MG tablet Take 1 tablet (15 mg total) by mouth every 6 (six) hours as needed for severe pain. 11/02/14  Yes  Tresa Garter, MD  ondansetron (ZOFRAN) 4 MG tablet Take 1 tablet (4 mg total) by mouth every 8 (eight) hours as needed for nausea or vomiting. 07/08/14  Yes Francine Graven, DO  polyethylene glycol (MIRALAX / GLYCOLAX) packet Take 17 g by mouth daily as needed for mild constipation.    Yes Historical Provider, MD  LORazepam (ATIVAN) 1 MG tablet Take 1 tablet (1 mg total) by mouth 2 (two) times daily as needed for anxiety. 11/21/14   Micheline Chapman, NP   BP 123/72 mmHg  Pulse 91  Temp(Src) 98.7 F (37.1 C) (Oral)  Resp 16  Ht 5\' 6"  (1.676 m)  Wt 230 lb (104.327 kg)  BMI 37.14 kg/m2  SpO2 99%  LMP 10/31/2014 (LMP Unknown) Physical Exam  Constitutional: She  is oriented to person, place, and time. She appears well-developed and well-nourished.  HENT:  Head: Normocephalic and atraumatic.  Mouth/Throat: Oropharynx is clear and moist.  Eyes: Conjunctivae are normal. Pupils are equal, round, and reactive to light. Right eye exhibits no discharge. Left eye exhibits no discharge. No scleral icterus.  Neck: Neck supple.  Cardiovascular: Normal rate, regular rhythm and normal heart sounds.   Pulmonary/Chest: Effort normal and breath sounds normal. No respiratory distress. She has no rales.  Mild wheezing diffusely in lung fields. No other evidence of respiratory distress. Maintains oxygen saturations at 99% on room air.  Abdominal: Soft. There is no tenderness.  Musculoskeletal: She exhibits no tenderness.  Neurological: She is alert and oriented to person, place, and time.  Cranial Nerves II-XII grossly intact  Skin: Skin is warm and dry. No rash noted.  Psychiatric: She has a normal mood and affect.  Nursing note and vitals reviewed.   ED Course  Procedures (including critical care time) Labs Review Labs Reviewed  BASIC METABOLIC PANEL - Abnormal; Notable for the following:    Glucose, Bld 132 (*)    All other components within normal limits  CBC WITH DIFFERENTIAL/PLATELET - Abnormal; Notable for the following:    RBC 3.86 (*)    Hemoglobin 9.7 (*)    HCT 28.5 (*)    MCV 73.8 (*)    MCH 25.1 (*)    RDW 16.9 (*)    Platelets 107 (*)    All other components within normal limits  RETICULOCYTES - Abnormal; Notable for the following:    Retic Ct Pct 5.4 (*)    RBC. 3.86 (*)    Retic Count, Manual 208.4 (*)    All other components within normal limits  CBC WITH DIFFERENTIAL/PLATELET    Imaging Review No results found.   EKG Interpretation   Date/Time:  Saturday November 19 2014 12:34:26 EDT Ventricular Rate:  84 PR Interval:  141 QRS Duration: 99 QT Interval:  401 QTC Calculation: 474 R Axis:   24 Text Interpretation:  Sinus rhythm  Low voltage, precordial leads ED  PHYSICIAN INTERPRETATION AVAILABLE IN [facility] Confirmed by TEST,  Record ([id number]) on 11/20/2014 7:57:55 AM     Meds given in ED:  Medications  HYDROmorphone (DILAUDID) injection 1 mg (1 mg Intravenous Given 11/19/14 1326)  HYDROmorphone (DILAUDID) injection 1 mg (1 mg Intravenous Given 11/19/14 1450)  HYDROmorphone (DILAUDID) injection 1 mg (1 mg Intravenous Given 11/19/14 1619)    Discharge Medication List as of 11/19/2014  4:20 PM     Filed Vitals:   11/19/14 1236 11/19/14 1451 11/19/14 1519 11/19/14 1624  BP: 136/90 121/87  123/72  Pulse: 89 78  91  Temp:  98.6 F (37 C) 97.8 F (36.6 C)  98.7 F (37.1 C)  TempSrc:  Oral  Oral  Resp:  13  16  Height: 5\' 6"  (1.676 m)     Weight: 230 lb (104.327 kg)     SpO2: 99% 94% 99% 99%    MDM  Vitals stable - WNL -afebrile Pt resting comfortably in ED. reports she feels much better after administration of analgesia. Reports she feels like she is breathing better even prior to breathing treatment. After breathing treatment, patient states "it worked perfect". PE--physical exam is not concerning. On reevaluation, wheezing has much improved. Labwork: Hemoglobin, reticulocytes at baseline. Imaging: Chest x-ray shows no acute focal consolidations.  Patient states she feels better but would like one more round of pain medicine before going home. States that she feels like she can be discharged and can manage her pain at home. Will return if pain returns or any sign of respiratory difficulty. No evidence of acute chest syndrome, aplastic crisis. Signs and symptoms from today's visit are baseline for patient and are not new problems I discussed all relevant lab findings and imaging results with pt and they verbalized understanding. Discussed f/u with PCP within 48 hrs and return precautions, pt very amenable to plan.  Final diagnoses:  Sickle cell anemia with pain        Comer Locket,  PA-C 11/22/14 1558  Debby Freiberg, MD 11/25/14 1655

## 2014-11-21 ENCOUNTER — Ambulatory Visit (INDEPENDENT_AMBULATORY_CARE_PROVIDER_SITE_OTHER): Payer: Medicare Other | Admitting: Family Medicine

## 2014-11-21 VITALS — BP 123/74 | HR 86 | Temp 98.5°F | Resp 16 | Ht 66.0 in | Wt 235.0 lb

## 2014-11-21 DIAGNOSIS — M6740 Ganglion, unspecified site: Secondary | ICD-10-CM

## 2014-11-21 DIAGNOSIS — N926 Irregular menstruation, unspecified: Secondary | ICD-10-CM

## 2014-11-21 MED ORDER — LORAZEPAM 1 MG PO TABS: 1.0000 mg | ORAL_TABLET | Freq: Two times a day (BID) | ORAL | Status: DC | PRN

## 2014-11-21 NOTE — Patient Instructions (Signed)
It is too soon to refill your MS. According to your chart you have no been getting that on a regular basis. I am decreasing your Ativan 1 mg to #60. To be taken no more than 2 times a day. 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 Keep medications in a safe, locked place.

## 2014-11-21 NOTE — Progress Notes (Signed)
Patient ID: Cassie Montgomery, female   DOB: [birth year], 44 y.o.   MRN: [id number]   Cassie Montgomery, is a 44 y.o. female  KXF:[id number]  IRC:[id number]  DOB - [birth year]  CC:  Chief Complaint  Patient presents with  . Follow-up    scd follow up        HPI: Cassie Montgomery is a 44 y.o. female here for follow-up SCD and chronic pain.She also complains of a lump and pain in her right wrist and irregular periods. She was in ED recently for [place] pain. Today she needs a refill on her methadone and her ativan. She is not taking Hydrea due to side effects. She has been on ativan 1 mg. 2-3 times a day for the last several months for anxiety and sleep.  Allergies  Allergen Reactions  . Sulfa Antibiotics Other (See Comments)    Increased bilirubin   Past Medical History  Diagnosis Date  . PMDD (premenstrual dysphoric disorder)   . Sickle cell anemia   . Sickle cell disease without crisis 07/15/2011  . Migraine with aura 07/15/2011  . GERD (gastroesophageal reflux disease)   . Asthma in adult     probable  . Depression   . Chronic pain syndrome   . Left arm pain     chronic  . Anxiety    Current Outpatient Prescriptions on File Prior to Visit  Medication Sig Dispense Refill  . albuterol (PROVENTIL HFA;VENTOLIN HFA) 108 (90 BASE) MCG/ACT inhaler Inhale 2 puffs into the lungs every 6 (six) hours as needed for wheezing or shortness of breath (wheezing).     . busPIRone (BUSPAR) 7.5 MG tablet TAKE 1 TABLET BY MOUTH 3 TIMES A DAY 60 tablet 0  . butalbital-aspirin-caffeine (FIORINAL) 50-325-40 MG per capsule Take 1 capsule by mouth every 4 (four) hours as needed for headache. 30 capsule 0  . fluticasone (FLONASE) 50 MCG/ACT nasal spray Place 2 sprays into both nostrils daily as needed for allergies or rhinitis (allergies).     . folic acid (FOLVITE) 1 MG tablet Take 1 tablet (1 mg total) by mouth every morning. 30 tablet 11  . hydroxyurea (HYDREA) 500 MG capsule Take 1 capsule (500 mg total) by  mouth daily. May take with food to minimize GI side effects. 30 capsule 0  . ibuprofen (ADVIL,MOTRIN) 600 MG tablet Take 1 tablet (600 mg total) by mouth every 8 (eight) hours as needed for moderate pain. 30 tablet 1  . methadone (DOLOPHINE) 5 MG tablet Take 1 tablet (5 mg total) by mouth at bedtime. 30 tablet 0  . morphine (MSIR) 15 MG tablet Take 1 tablet (15 mg total) by mouth every 6 (six) hours as needed for severe pain. 90 tablet 0  . ondansetron (ZOFRAN) 4 MG tablet Take 1 tablet (4 mg total) by mouth every 8 (eight) hours as needed for nausea or vomiting. 6 tablet 0  . polyethylene glycol (MIRALAX / GLYCOLAX) packet Take 17 g by mouth daily as needed for mild constipation.      No current facility-administered medications on file prior to visit.   Family History  Problem Relation Age of Onset  . Hypertension Mother   . Asthma Son   . Asthma Son   . Asthma Daughter    History   Social History  . Marital Status: Single    Spouse Name: N/A  . Number of Children: N/A  . Years of Education: N/A   Occupational History  . Not on file.   Social  History Main Topics  . Smoking status: Never Smoker   . Smokeless tobacco: Not on file  . Alcohol Use: No  . Drug Use: No  . Sexual Activity: Yes   Other Topics Concern  . Not on file   Social History Narrative   Uses a cane occasionally.  Does not work.      Review of Systems: Constitutional: Denies chills, fever, weight loss HENT: Denies Problems Eyes: Denies problems Neck: Denies problems Respiratory: Negative for cough, shortness of breath,   Cardiovascular: Negative for chest pain, palpitations and leg swelling. Gastrointestinal: Negative for abdominal distention, abdominal pain, nausea, vomiting, diarrhea,  Genitourinary: Denies problems Musculoskeletal: extremity pain due to SCD. Right wrist pain with lump on back of wrist. Neurological: Denies problems Hematological: Denies problems Psychiatric/Behavioral: Admits to  depression and anxiety. See counselor.   Objective:   Filed Vitals:   11/21/14 1355  BP: 123/74  Pulse: 86  Temp: 98.5 F (36.9 C)  Resp: 16    Physical Exam: Constitutional: Patient appears well-developed and well-nourished. No distress. HENT: Normocephalic, atraumatic, External right and left ear normal. Oropharynx is clear and moist.  Eyes: Conjunctivae and EOM are normal. PERRLA, no scleral icterus. Neck: Normal ROM. Neck supple. No lymphadenopathy, No thyromegaly. CVS: RRR, S1/S2 +, no murmurs, no gallops, no rubs Pulmonary: Effort and breath sounds normal, no stridor, rhonchi, wheezes, rales.  Abdominal: Soft. Normoactive BS,, no distension, tenderness, rebound or guarding.  Musculoskeletal: Normal range of motion. No edema and no tenderness. There is a grape sized ganglion on the back of her right wrist. Neuro: Alert.Normal muscle tone coordination. Non-focal Skin: Skin is warm and dry. No rash noted. Not diaphoretic. No erythema. No pallor. Psychiatric: Normal mood and affect. Behavior, judgment, thought content normal.  Lab Results  Component Value Date   WBC 6.8 11/19/2014   HGB 9.7* 11/19/2014   HCT 28.5* 11/19/2014   MCV 73.8* 11/19/2014   PLT 107* 11/19/2014   Lab Results  Component Value Date   CREATININE 0.63 11/19/2014   BUN 6 11/19/2014   NA 140 11/19/2014   K 4.2 11/19/2014   CL 106 11/19/2014   CO2 26 11/19/2014    Lab Results  Component Value Date   HGBA1C 4.0 08/27/2013   Lipid Panel  No results found for: CHOL, TRIG, HDL, CHOLHDL, VLDL, LDLCALC     Assessment and plan:   SCD -Refill of her Methadone 5 mg #30, one po at HS -Is too soon to refill her Morphine -MSIR -Reminded to only get her controlled substances here and to keep in a safe place. -Follow-up in one month.  Anxiety - I have explained that we are  Not providing benzodiazapene as frequently as in the past and that we must begin to wean her off. -I have decreased her  prescription from 90 to 60 to last a month. On next visit will continue to wean -If she continues to need more than once a day, she will need to see mental health profession who can prescribe.  Ganglion cyst -referral to hand surgeon  Irregular periods -referral to GYN.   Micheline Chapman, FNP-BC  Return in about 1 month (around 12/22/2014) for SCD.  The patient was given clear instructions to go to ER or return to medical center if symptoms don't improve, worsen or new problems develop. The patient verbalized understanding. The patient was told to call to get lab results if they haven't heard anything in the next week.  Micheline Chapman, MSN, FNP-BC   11/21/2014, 2:40 PM

## 2014-11-23 ENCOUNTER — Telehealth: Payer: Self-pay

## 2014-11-23 ENCOUNTER — Other Ambulatory Visit: Payer: Self-pay | Admitting: Family Medicine

## 2014-11-23 DIAGNOSIS — D5720 Sickle-cell/Hb-C disease without crisis: Secondary | ICD-10-CM

## 2014-11-23 MED ORDER — MORPHINE SULFATE 15 MG PO TABS: 15.0000 mg | ORAL_TABLET | Freq: Four times a day (QID) | ORAL | Status: DC | PRN

## 2014-11-23 NOTE — Telephone Encounter (Signed)
Refill request for Morphine 15mg . LOV 11/21/2014. Please advise. Thanks!

## 2014-11-24 ENCOUNTER — Encounter: Payer: Self-pay | Admitting: Certified Nurse Midwife

## 2014-11-24 ENCOUNTER — Ambulatory Visit (INDEPENDENT_AMBULATORY_CARE_PROVIDER_SITE_OTHER): Payer: Medicare Other | Admitting: Certified Nurse Midwife

## 2014-11-24 VITALS — BP 124/93 | HR 89 | Temp 97.9°F | Ht 66.0 in | Wt 232.0 lb

## 2014-11-24 DIAGNOSIS — N939 Abnormal uterine and vaginal bleeding, unspecified: Secondary | ICD-10-CM

## 2014-11-24 DIAGNOSIS — D649 Anemia, unspecified: Secondary | ICD-10-CM

## 2014-11-24 DIAGNOSIS — E669 Obesity, unspecified: Secondary | ICD-10-CM

## 2014-11-24 DIAGNOSIS — N760 Acute vaginitis: Secondary | ICD-10-CM

## 2014-11-24 DIAGNOSIS — Z0000 Encounter for general adult medical examination without abnormal findings: Secondary | ICD-10-CM | POA: Diagnosis not present

## 2014-11-24 DIAGNOSIS — Z124 Encounter for screening for malignant neoplasm of cervix: Secondary | ICD-10-CM

## 2014-11-24 DIAGNOSIS — L680 Hirsutism: Secondary | ICD-10-CM

## 2014-11-24 DIAGNOSIS — Z113 Encounter for screening for infections with a predominantly sexual mode of transmission: Secondary | ICD-10-CM | POA: Diagnosis not present

## 2014-11-24 DIAGNOSIS — N926 Irregular menstruation, unspecified: Secondary | ICD-10-CM

## 2014-11-24 DIAGNOSIS — Z01419 Encounter for gynecological examination (general) (routine) without abnormal findings: Secondary | ICD-10-CM | POA: Diagnosis not present

## 2014-11-24 DIAGNOSIS — M791 Myalgia, unspecified site: Secondary | ICD-10-CM

## 2014-11-24 DIAGNOSIS — F3281 Premenstrual dysphoric disorder: Secondary | ICD-10-CM

## 2014-11-24 DIAGNOSIS — D571 Sickle-cell disease without crisis: Secondary | ICD-10-CM

## 2014-11-24 DIAGNOSIS — N912 Amenorrhea, unspecified: Secondary | ICD-10-CM

## 2014-11-24 DIAGNOSIS — R5382 Chronic fatigue, unspecified: Secondary | ICD-10-CM

## 2014-11-24 DIAGNOSIS — N949 Unspecified condition associated with female genital organs and menstrual cycle: Secondary | ICD-10-CM | POA: Diagnosis not present

## 2014-11-24 DIAGNOSIS — Z1231 Encounter for screening mammogram for malignant neoplasm of breast: Secondary | ICD-10-CM

## 2014-11-24 DIAGNOSIS — N943 Premenstrual tension syndrome: Secondary | ICD-10-CM | POA: Diagnosis not present

## 2014-11-24 DIAGNOSIS — E139 Other specified diabetes mellitus without complications: Secondary | ICD-10-CM

## 2014-11-24 DIAGNOSIS — E119 Type 2 diabetes mellitus without complications: Secondary | ICD-10-CM | POA: Diagnosis not present

## 2014-11-24 LAB — COMPREHENSIVE METABOLIC PANEL
ALBUMIN: 4.3 g/dL (ref 3.5–5.2)
ALK PHOS: 59 U/L (ref 39–117)
ALT: 10 U/L (ref 0–35)
AST: 13 U/L (ref 0–37)
BUN: 12 mg/dL (ref 6–23)
CHLORIDE: 104 meq/L (ref 96–112)
CO2: 22 meq/L (ref 19–32)
Calcium: 8.9 mg/dL (ref 8.4–10.5)
Creat: 0.75 mg/dL (ref 0.50–1.10)
Glucose, Bld: 92 mg/dL (ref 70–99)
Potassium: 3.7 mEq/L (ref 3.5–5.3)
SODIUM: 140 meq/L (ref 135–145)
Total Bilirubin: 2.2 mg/dL — ABNORMAL HIGH (ref 0.2–1.2)
Total Protein: 7.2 g/dL (ref 6.0–8.3)

## 2014-11-24 LAB — CHOLESTEROL, TOTAL: Cholesterol: 118 mg/dL (ref 0–200)

## 2014-11-24 LAB — HDL CHOLESTEROL: HDL: 31 mg/dL — AB (ref >=46)

## 2014-11-24 LAB — TRIGLYCERIDES: TRIGLYCERIDES: 194 mg/dL — AB (ref <150)

## 2014-11-24 NOTE — Progress Notes (Signed)
Patient ID: Cassie Montgomery, female   DOB: [birth year], 44 y.o.   MRN: [id number]    Subjective:     Cassie Montgomery is a 44 y.o. female here for a routine exam.  Current complaints: irregular periods, skipping 3-4 months between, not a pattern.  Menses last about 7-14 days with  Heavy flow 1-2 hours soaked tampons, dysmenorrhea and PMDD. Denies clots.  Currently sexually active.  Done having children.  Desires full STD screening exam.    Personal health questionnaire:  Is patient Ashkenazi Jewish, have a family history of breast and/or ovarian cancer: yes, 3 maternal cousins with BCA Is there a family history of uterine cancer diagnosed at age < 42, gastrointestinal cancer, urinary tract cancer, family member who is a Field se[employer]ologist syndrome-associated carrier: no Is the patient overweight and hypertensive, family history of diabetes, personal history of gestational diabetes, preeclampsia or PCOS: yes Is patient over 11, have PCOS,  family history of premature CHD under age 28, diabetes, smoke, have hypertension or peripheral artery disease:  Yes, mother has HTN At any time, has a partner hit, kicked or otherwise hurt or frightened you?: no Over the past 2 weeks, have you felt down, depressed or hopeless?: no Over the past 2 weeks, have you felt little interest or pleasure in doing things?:no   Gynecologic History Patient's last menstrual period was 10/17/2014. Contraception: none Last Pap: unknown. Results were: normal according to the patient Last mammogram: 06/23/13. Results were: normal  Obstetric History OB History  Gravida Para Term Preterm AB SAB TAB Ectopic Multiple Living  3         3    # Outcome Date GA Lbr Len/2nd Weight Sex Delivery Anes PTL Lv  3 Gravida 01/29/06    F CS-LTranv   Y  2 Gravida 02/15/95    M Vag-Spont   Y  1 Gravida 06/15/90    Cassie Montgomery   Y      Past Medical History  Diagnosis Date  . PMDD (premenstrual dysphoric disorder)   . Sickle cell anemia   . Sickle  cell disease without crisis 07/15/2011  . Migraine with aura 07/15/2011  . GERD (gastroesophageal reflux disease)   . Asthma in adult     probable  . Depression   . Chronic pain syndrome   . Left arm pain     chronic  . Anxiety   . Ganglion cyst     Past Surgical History  Procedure Laterality Date  . Cesarean section      2     Current outpatient prescriptions:  .  albuterol (PROVENTIL HFA;VENTOLIN HFA) 108 (90 BASE) MCG/ACT inhaler, Inhale 2 puffs into the lungs every 6 (six) hours as needed for wheezing or shortness of breath (wheezing). , Disp: , Rfl:  .  busPIRone (BUSPAR) 7.5 MG tablet, TAKE 1 TABLET BY MOUTH 3 TIMES A DAY, Disp: 60 tablet, Rfl: 0 .  butalbital-aspirin-caffeine (FIORINAL) 50-325-40 MG per capsule, Take 1 capsule by mouth every 4 (four) hours as needed for headache., Disp: 30 capsule, Rfl: 0 .  fluticasone (FLONASE) 50 MCG/ACT nasal spray, Place 2 sprays into both nostrils daily as needed for allergies or rhinitis (allergies). , Disp: , Rfl:  .  folic acid (FOLVITE) 1 MG tablet, Take 1 tablet (1 mg total) by mouth every morning., Disp: 30 tablet, Rfl: 11 .  ibuprofen (ADVIL,MOTRIN) 600 MG tablet, Take 1 tablet (600 mg total) by mouth every 8 (eight) hours as needed for moderate pain., Disp: 30  tablet, Rfl: 1 .  LORazepam (ATIVAN) 1 MG tablet, Take 1 tablet (1 mg total) by mouth 2 (two) times daily as needed for anxiety., Disp: 60 tablet, Rfl: 0 .  methadone (DOLOPHINE) 5 MG tablet, Take 1 tablet (5 mg total) by mouth at bedtime., Disp: 30 tablet, Rfl: 0 .  morphine (MSIR) 15 MG tablet, Take 1 tablet (15 mg total) by mouth every 6 (six) hours as needed for severe pain., Disp: 90 tablet, Rfl: 0 .  polyethylene glycol (MIRALAX / GLYCOLAX) packet, Take 17 g by mouth daily as needed for mild constipation. , Disp: , Rfl:  .  hydroxyurea (HYDREA) 500 MG capsule, Take 1 capsule (500 mg total) by mouth daily. May take with food to minimize GI side effects. (Patient not taking:  Reported on 11/24/2014), Disp: 30 capsule, Rfl: 0 .  ondansetron (ZOFRAN) 4 MG tablet, Take 1 tablet (4 mg total) by mouth every 8 (eight) hours as needed for nausea or vomiting. (Patient not taking: Reported on 11/24/2014), Disp: 6 tablet, Rfl: 0 Allergies  Allergen Reactions  . Sulfa Antibiotics Other (See Comments)    Increased bilirubin    History  Substance Use Topics  . Smoking status: Never Smoker   . Smokeless tobacco: Not on file  . Alcohol Use: No    Family History  Problem Relation Age of Onset  . Hypertension Mother   . Asthma Son   . Asthma Son   . Asthma Daughter       Review of Systems  Constitutional: negative for fatigue and weight loss Respiratory: negative for cough and wheezing Cardiovascular: negative for chest pain, fatigue and palpitations Gastrointestinal: negative for abdominal pain and change in bowel habits Musculoskeletal:negative for myalgias Neurological: negative for gait problems and tremors Behavioral/Psych: negative for abusive relationship, depression Endocrine: negative for temperature intolerance   Genitourinary:negative for abnormal menstrual periods, genital lesions, hot flashes, sexual problems and vaginal discharge Integument/breast: negative for breast lump, breast tenderness, nipple discharge and skin lesion(s)    Objective:       BP 124/93 mmHg  Pulse 89  Temp(Src) 97.9 F (36.6 C)  Ht 5\' 6"  (1.676 m)  Wt 232 lb (105.235 kg)  BMI 37.46 kg/m2  LMP 10/17/2014 General:   alert  Skin:   no rash or abnormalities  Lungs:   clear to auscultation bilaterally  Heart:   regular rate and rhythm, S1, S2 normal, no murmur, click, rub or gallop  Breasts:   normal without suspicious masses, skin or nipple changes or axillary nodes  Abdomen:  normal findings: no organomegaly, soft, non-tender and no hernia  Pelvis:  External genitalia: normal general appearance Urinary system: urethral meatus normal and bladder without fullness,  nontender Vaginal: normal without tenderness, induration or masses Cervix: normal appearance Adnexa: normal bimanual exam Uterus: anteverted and non-tender, normal size   Lab Review Urine pregnancy test Labs reviewed yes Radiologic studies reviewed yes  50% of 30 min visit spent on counseling and coordination of care.   Assessment:    Healthy female exam.   AUB Hx of DM type 2 Obesity PMDD Fatigue Contraception counseling Hx of sickle cell Dysmenorrhea Vulvovaginitis Depression hx of    Plan:    Education reviewed: depression evaluation, low fat, low cholesterol diet, safe sex/STD prevention, self breast exams, skin cancer screening and weight bearing exercise. Contraception: patient to decide on Mirena IUD vs OCPs. Mammogram ordered. Follow up in: 1 month.   No orders of the defined types were placed in  this encounter.   Orders Placed This Encounter  Procedures  . SureSwab, Vaginosis/Vaginitis Plus  . US Pelvis Complete    Standing Status: Future     Number of Occurrences:      Standing Expiration Date: 01/25/2016    Order Specific Question:  Reason for Exam (SYMPTOM  OR DIAGNOSIS REQUIRED)    Answer:  AUB    Order Specific Question:  Preferred imaging location?    Answer:  [facility]  . US Transvaginal Non-OB    Standing Status: Future     Number of Occurrences:      Standing Expiration Date: 01/25/2016    Order Specific Question:  Reason for Exam (SYMPTOM  OR DIAGNOSIS REQUIRED)    Answer:  AUB    Order Specific Question:  Preferred imaging location?    Answer:  [place] [facility]    Standing Status: Future     Number of Occurrences:      Standing Expiration Date: 01/25/2016    Order Specific Question:  Reason for Exam (SYMPTOM  OR DIAGNOSIS REQUIRED)    Answer:  well woman exam    Order Specific Question:  Is the patient pregnant?    Answer:  No    Order Specific Question:  Preferred imaging location?    Answer:   [facility]  . Hepatitis B surface antigen  . RPR  . Hepatitis C antibody  . Prolactin  . Testosterone, Free, Total, SHBG  . 17-Hydroxyprogesterone  . Progesterone  . CBC with Differential/Platelet  . Comprehensive metabolic panel  . Cholesterol, total  . Triglycerides  . HDL cholesterol  . Estrogens, Total  . TSH  . HIV antibody (with reflex)  . Hemoglobin A1c

## 2014-11-25 DIAGNOSIS — Z01419 Encounter for gynecological examination (general) (routine) without abnormal findings: Secondary | ICD-10-CM | POA: Diagnosis not present

## 2014-11-25 DIAGNOSIS — Z124 Encounter for screening for malignant neoplasm of cervix: Secondary | ICD-10-CM | POA: Diagnosis not present

## 2014-11-25 DIAGNOSIS — Z113 Encounter for screening for infections with a predominantly sexual mode of transmission: Secondary | ICD-10-CM | POA: Diagnosis not present

## 2014-11-25 DIAGNOSIS — E669 Obesity, unspecified: Secondary | ICD-10-CM | POA: Diagnosis not present

## 2014-11-25 DIAGNOSIS — E139 Other specified diabetes mellitus without complications: Secondary | ICD-10-CM | POA: Diagnosis not present

## 2014-11-25 DIAGNOSIS — D649 Anemia, unspecified: Secondary | ICD-10-CM | POA: Diagnosis not present

## 2014-11-25 DIAGNOSIS — N939 Abnormal uterine and vaginal bleeding, unspecified: Secondary | ICD-10-CM | POA: Diagnosis not present

## 2014-11-25 LAB — CBC WITH DIFFERENTIAL/PLATELET
Basophils Absolute: 0 10*3/uL (ref 0.0–0.1)
Basophils Relative: 0 % (ref 0–1)
EOS ABS: 0.3 10*3/uL (ref 0.0–0.7)
Eosinophils Relative: 4 % (ref 0–5)
HEMATOCRIT: 31.6 % — AB (ref 36.0–46.0)
Hemoglobin: 10.4 g/dL — ABNORMAL LOW (ref 12.0–15.0)
LYMPHS ABS: 2.2 10*3/uL (ref 0.7–4.0)
Lymphocytes Relative: 28 % (ref 12–46)
MCH: 24.8 pg — ABNORMAL LOW (ref 26.0–34.0)
MCHC: 32.9 g/dL (ref 30.0–36.0)
MCV: 75.4 fL — ABNORMAL LOW (ref 78.0–100.0)
MONO ABS: 0.2 10*3/uL (ref 0.1–1.0)
Monocytes Relative: 3 % (ref 3–12)
NEUTROS ABS: 5.1 10*3/uL (ref 1.7–7.7)
NEUTROS PCT: 65 % (ref 43–77)
Platelets: 111 10*3/uL — ABNORMAL LOW (ref 150–400)
RBC: 4.19 MIL/uL (ref 3.87–5.11)
RDW: 17.8 % — ABNORMAL HIGH (ref 11.5–15.5)
WBC: 7.8 10*3/uL (ref 4.0–10.5)

## 2014-11-25 LAB — TESTOSTERONE, FREE, TOTAL, SHBG
Sex Hormone Binding: 40 nmol/L (ref 17–124)
TESTOSTERONE FREE: 4.1 pg/mL (ref 0.6–6.8)
TESTOSTERONE-% FREE: 1.6 % (ref 0.4–2.4)
Testosterone: 26 ng/dL (ref 10–70)

## 2014-11-25 LAB — HEMOGLOBIN A1C
Hgb A1c MFr Bld: 4.1 % (ref <5.7)
Mean Plasma Glucose: 71 mg/dL (ref <117)

## 2014-11-25 LAB — HIV ANTIBODY (ROUTINE TESTING W REFLEX): HIV 1&2 Ab, 4th Generation: NONREACTIVE

## 2014-11-25 LAB — RPR

## 2014-11-25 LAB — PAP IG (IMAGE GUIDED)

## 2014-11-25 LAB — PROLACTIN: PROLACTIN: 14.5 ng/mL

## 2014-11-25 LAB — PROGESTERONE: Progesterone: 6.6 ng/mL

## 2014-11-25 LAB — TSH: TSH: 3.029 u[IU]/mL (ref 0.350–4.500)

## 2014-11-25 LAB — HEPATITIS C ANTIBODY: HCV Ab: NEGATIVE

## 2014-11-25 LAB — HEPATITIS B SURFACE ANTIGEN: HEP B S AG: NEGATIVE

## 2014-11-28 LAB — SURESWAB, VAGINOSIS/VAGINITIS PLUS
Atopobium vaginae: 7 Log (cells/mL)
BV CATEGORY: UNDETERMINED — AB
C. ALBICANS, DNA: DETECTED — AB
C. PARAPSILOSIS, DNA: NOT DETECTED
C. glabrata, DNA: NOT DETECTED
C. trachomatis RNA, TMA: NOT DETECTED
C. tropicalis, DNA: NOT DETECTED
Gardnerella vaginalis: 7.8 Log (cells/mL)
LACTOBACILLUS SPECIES: 7.4 Log (cells/mL)
MEGASPHAERA SPECIES: 7.3 Log (cells/mL)
N. gonorrhoeae RNA, TMA: NOT DETECTED
T. vaginalis RNA, QL TMA: NOT DETECTED

## 2014-11-28 LAB — ESTROGENS, TOTAL: Estrogen: 159 pg/mL

## 2014-11-28 LAB — 17-HYDROXYPROGESTERONE: 17-OH-PROGESTERONE, LC/MS/MS: 60 ng/dL

## 2014-11-29 ENCOUNTER — Other Ambulatory Visit: Payer: Self-pay | Admitting: Certified Nurse Midwife

## 2014-11-29 DIAGNOSIS — B373 Candidiasis of vulva and vagina: Secondary | ICD-10-CM

## 2014-11-29 DIAGNOSIS — B3731 Acute candidiasis of vulva and vagina: Secondary | ICD-10-CM

## 2014-11-29 DIAGNOSIS — N760 Acute vaginitis: Principal | ICD-10-CM

## 2014-11-29 DIAGNOSIS — B9689 Other specified bacterial agents as the cause of diseases classified elsewhere: Secondary | ICD-10-CM

## 2014-11-29 MED ORDER — FLUCONAZOLE 100 MG PO TABS: 100.0000 mg | ORAL_TABLET | Freq: Once | ORAL | Status: DC

## 2014-11-29 MED ORDER — TINIDAZOLE 500 MG PO TABS: 2.0000 g | ORAL_TABLET | Freq: Every day | ORAL | Status: DC

## 2014-11-29 MED ORDER — TERCONAZOLE 0.4 % VA CREA: 1.0000 | TOPICAL_CREAM | Freq: Every day | VAGINAL | Status: DC

## 2014-11-30 ENCOUNTER — Telehealth: Payer: Self-pay

## 2014-11-30 ENCOUNTER — Ambulatory Visit (HOSPITAL_COMMUNITY): Payer: Medicare Other

## 2014-11-30 ENCOUNTER — Other Ambulatory Visit: Payer: Self-pay | Admitting: Certified Nurse Midwife

## 2014-11-30 DIAGNOSIS — D5720 Sickle-cell/Hb-C disease without crisis: Secondary | ICD-10-CM

## 2014-11-30 DIAGNOSIS — N760 Acute vaginitis: Principal | ICD-10-CM

## 2014-11-30 DIAGNOSIS — B373 Candidiasis of vulva and vagina: Secondary | ICD-10-CM

## 2014-11-30 DIAGNOSIS — B9689 Other specified bacterial agents as the cause of diseases classified elsewhere: Secondary | ICD-10-CM

## 2014-11-30 DIAGNOSIS — E782 Mixed hyperlipidemia: Secondary | ICD-10-CM

## 2014-11-30 DIAGNOSIS — E669 Obesity, unspecified: Secondary | ICD-10-CM

## 2014-11-30 DIAGNOSIS — B3731 Acute candidiasis of vulva and vagina: Secondary | ICD-10-CM

## 2014-11-30 MED ORDER — TERCONAZOLE 0.4 % VA CREA: 1.0000 | TOPICAL_CREAM | Freq: Every day | VAGINAL | Status: DC

## 2014-11-30 MED ORDER — IBUPROFEN 600 MG PO TABS: 600.0000 mg | ORAL_TABLET | Freq: Three times a day (TID) | ORAL | Status: DC | PRN

## 2014-11-30 MED ORDER — FLUCONAZOLE 100 MG PO TABS: 100.0000 mg | ORAL_TABLET | Freq: Once | ORAL | Status: DC

## 2014-11-30 MED ORDER — TINIDAZOLE 500 MG PO TABS: 2.0000 g | ORAL_TABLET | Freq: Every day | ORAL | Status: DC

## 2014-11-30 NOTE — Telephone Encounter (Signed)
Every 4 hours exceeds the maximum dose allowed. If every 6 hours is not sufficient, will need referral to pain clinic.

## 2014-11-30 NOTE — Telephone Encounter (Signed)
Patient states she is having break-though pain in between doses 2 hours before her next dose is due and wants to know what can be done. If dosage can be changed back to every 4 hours. Thanks!

## 2014-12-01 ENCOUNTER — Other Ambulatory Visit: Payer: Self-pay | Admitting: *Deleted

## 2014-12-01 DIAGNOSIS — B9689 Other specified bacterial agents as the cause of diseases classified elsewhere: Secondary | ICD-10-CM

## 2014-12-01 DIAGNOSIS — N760 Acute vaginitis: Principal | ICD-10-CM

## 2014-12-01 MED ORDER — METRONIDAZOLE 500 MG PO TABS: 500.0000 mg | ORAL_TABLET | Freq: Two times a day (BID) | ORAL | Status: DC

## 2014-12-12 ENCOUNTER — Other Ambulatory Visit: Payer: Self-pay | Admitting: Family Medicine

## 2014-12-12 ENCOUNTER — Telehealth: Payer: Self-pay | Admitting: Family Medicine

## 2014-12-12 DIAGNOSIS — G894 Chronic pain syndrome: Secondary | ICD-10-CM

## 2014-12-12 DIAGNOSIS — D5720 Sickle-cell/Hb-C disease without crisis: Secondary | ICD-10-CM

## 2014-12-12 MED ORDER — METHADONE HCL 5 MG PO TABS: 5.0000 mg | ORAL_TABLET | Freq: Every day | ORAL | Status: DC

## 2014-12-12 NOTE — Telephone Encounter (Signed)
Refill request for methadone 5mg . LOV 11/21/2014. Please advise. Thanks!

## 2014-12-14 ENCOUNTER — Telehealth: Payer: Self-pay | Admitting: Family Medicine

## 2014-12-15 ENCOUNTER — Other Ambulatory Visit: Payer: Self-pay | Admitting: Family Medicine

## 2014-12-15 DIAGNOSIS — D5720 Sickle-cell/Hb-C disease without crisis: Secondary | ICD-10-CM

## 2014-12-15 MED ORDER — LORAZEPAM 0.5 MG PO TABS: 0.5000 mg | ORAL_TABLET | Freq: Three times a day (TID) | ORAL | Status: DC

## 2014-12-15 MED ORDER — MORPHINE SULFATE 15 MG PO TABS: 15.0000 mg | ORAL_TABLET | Freq: Four times a day (QID) | ORAL | Status: DC | PRN

## 2014-12-15 NOTE — Telephone Encounter (Signed)
Refill request for Ativan and MSIR. Please advise. Thanks! LOV 11/21/2014. Thanks!

## 2014-12-19 ENCOUNTER — Other Ambulatory Visit: Payer: Self-pay | Admitting: Family Medicine

## 2014-12-26 DIAGNOSIS — G541 Lumbosacral plexus disorders: Secondary | ICD-10-CM | POA: Diagnosis not present

## 2014-12-26 DIAGNOSIS — G8929 Other chronic pain: Secondary | ICD-10-CM | POA: Diagnosis not present

## 2014-12-26 DIAGNOSIS — G603 Idiopathic progressive neuropathy: Secondary | ICD-10-CM | POA: Diagnosis not present

## 2014-12-28 ENCOUNTER — Ambulatory Visit (INDEPENDENT_AMBULATORY_CARE_PROVIDER_SITE_OTHER): Payer: Medicare Other | Admitting: Certified Nurse Midwife

## 2014-12-28 ENCOUNTER — Encounter: Payer: Self-pay | Admitting: Certified Nurse Midwife

## 2014-12-28 VITALS — BP 132/78 | HR 116 | Temp 98.6°F | Ht 66.0 in | Wt 226.0 lb

## 2014-12-28 DIAGNOSIS — Z30011 Encounter for initial prescription of contraceptive pills: Secondary | ICD-10-CM | POA: Diagnosis not present

## 2014-12-28 DIAGNOSIS — B373 Candidiasis of vulva and vagina: Secondary | ICD-10-CM | POA: Diagnosis not present

## 2014-12-28 DIAGNOSIS — B3731 Acute candidiasis of vulva and vagina: Secondary | ICD-10-CM

## 2014-12-28 MED ORDER — NORGESTIM-ETH ESTRAD TRIPHASIC 0.18/0.215/0.25 MG-35 MCG PO TABS: 1.0000 | ORAL_TABLET | Freq: Every day | ORAL | Status: DC

## 2014-12-28 MED ORDER — TERCONAZOLE 0.4 % VA CREA: 1.0000 | TOPICAL_CREAM | Freq: Every day | VAGINAL | Status: DC

## 2014-12-28 MED ORDER — FLUCONAZOLE 100 MG PO TABS: 100.0000 mg | ORAL_TABLET | Freq: Once | ORAL | Status: DC

## 2014-12-28 NOTE — Progress Notes (Signed)
Patient ID: Cassie Montgomery, female   DOB: 04/06/1971, 44 y.o.   MRN: [id number]  Subjective:    Cassie Montgomery is a 43 y.o. female who presents for contraception counseling. The patient has complaints today about vaginal discharge. The patient is sexually active. Pertinent past medical history: none.  Is having roughly 2 week periods with large clots, dysmenorrhea and menorrhagia.  Desires to not have any periods.  Has a hx of sickle cell disease.  Is scheduled for pelvic ultrasound next week.  Is having vaginal irritation with white discharge.    The information documented in the HPI was reviewed and verified.  Menstrual History: OB History    Gravida Para Term Preterm AB TAB SAB Ectopic Multiple Living   3         3      Patient's last menstrual period was 12/26/2014.   Patient Active Problem List   Diagnosis Date Noted  . Abnormal uterine bleeding (AUB) 11/24/2014  . Cephalalgia 10/26/2014  . Medicare annual wellness visit, initial 07/27/2014  . Vitamin D deficiency 06/28/2014  . GERD (gastroesophageal reflux disease) 06/12/2014  . Asthma in adult   . S/P cholecystectomy 06/11/2014  . Cholecystitis, acute 06/10/2014  . Chronic prescription opiate use 01/12/2014  . Depression, major 10/07/2013  . PTSD (post-traumatic stress disorder) 10/07/2013  . Unspecified constipation 09/27/2013  . Weight gain 09/27/2013  . Irregular menstrual cycle 05/12/2013  . Panic anxiety syndrome 05/11/2013  . Insomnia 05/11/2013  . Pap smear for cervical cancer screening 05/11/2013  . Sickle cell disease, type [place] 02/15/2013  . Chronic pain syndrome 01/06/2013  . Depressive disorder, not elsewhere classified 10/20/2012  . Generalized anxiety disorder 10/20/2012  . Opiate analgesic contract exists 09/11/2012  . Anemia 12/07/2011   Past Medical History  Diagnosis Date  . PMDD (premenstrual dysphoric disorder)   . Sickle cell anemia   . Sickle cell disease without crisis 07/15/2011  . Migraine  with aura 07/15/2011  . GERD (gastroesophageal reflux disease)   . Asthma in adult     probable  . Depression   . Chronic pain syndrome   . Left arm pain     chronic  . Anxiety   . Ganglion cyst     Past Surgical History  Procedure Laterality Date  . Cesarean section      2     Current outpatient prescriptions:  .  albuterol (PROVENTIL HFA;VENTOLIN HFA) 108 (90 BASE) MCG/ACT inhaler, Inhale 2 puffs into the lungs every 6 (six) hours as needed for wheezing or shortness of breath (wheezing). , Disp: , Rfl:  .  busPIRone (BUSPAR) 7.5 MG tablet, TAKE 1 TABLET BY MOUTH 3 TIMES A DAY, Disp: 60 tablet, Rfl: 0 .  busPIRone (BUSPAR) 7.5 MG tablet, TAKE 1 TABLET BY MOUTH 3 TIMES A DAY, Disp: 60 tablet, Rfl: 0 .  folic acid (FOLVITE) 1 MG tablet, Take 1 tablet (1 mg total) by mouth every morning., Disp: 30 tablet, Rfl: 11 .  ibuprofen (ADVIL,MOTRIN) 600 MG tablet, Take 1 tablet (600 mg total) by mouth every 8 (eight) hours as needed for moderate pain., Disp: 30 tablet, Rfl: 1 .  oxyCODONE (ROXICODONE) 15 MG immediate release tablet, Take 15 mg by mouth every 4 (four) hours as needed for pain., Disp: , Rfl:  .  polyethylene glycol (MIRALAX / GLYCOLAX) packet, Take 17 g by mouth daily as needed for mild constipation. , Disp: , Rfl:  .  butalbital-aspirin-caffeine (FIORINAL) 50-325-40 MG per capsule, Take 1 capsule  by mouth every 4 (four) hours as needed for headache. (Patient not taking: Reported on 12/28/2014), Disp: 30 capsule, Rfl: 0 .  fluconazole (DIFLUCAN) 100 MG tablet, Take 1 tablet (100 mg total) by mouth once. Repeat dose in 48-72 hour., Disp: 3 tablet, Rfl: 0 .  fluticasone (FLONASE) 50 MCG/ACT nasal spray, Place 2 sprays into both nostrils daily as needed for allergies or rhinitis (allergies). , Disp: , Rfl:  .  hydroxyurea (HYDREA) 500 MG capsule, Take 1 capsule (500 mg total) by mouth daily. May take with food to minimize GI side effects. (Patient not taking: Reported on 11/24/2014), Disp:  30 capsule, Rfl: 0 .  LORazepam (ATIVAN) 0.5 MG tablet, Take 1 tablet (0.5 mg total) by mouth every 8 (eight) hours. (Patient not taking: Reported on 12/28/2014), Disp: 30 tablet, Rfl: 0 .  methadone (DOLOPHINE) 5 MG tablet, Take 1 tablet (5 mg total) by mouth at bedtime. (Patient not taking: Reported on 12/28/2014), Disp: 30 tablet, Rfl: 0 .  metroNIDAZOLE (FLAGYL) 500 MG tablet, Take 1 tablet (500 mg total) by mouth 2 (two) times daily. (Patient not taking: Reported on 12/28/2014), Disp: 14 tablet, Rfl: 0 .  morphine (MSIR) 15 MG tablet, Take 1 tablet (15 mg total) by mouth every 6 (six) hours as needed for severe pain. (Patient not taking: Reported on 12/28/2014), Disp: 90 tablet, Rfl: 0 .  Norgestimate-Ethinyl Estradiol Triphasic (TRI-SPRINTEC) 0.18/0.215/0.25 MG-35 MCG tablet, Take 1 tablet by mouth daily., Disp: 1 Package, Rfl: 11 .  ondansetron (ZOFRAN) 4 MG tablet, Take 1 tablet (4 mg total) by mouth every 8 (eight) hours as needed for nausea or vomiting. (Patient not taking: Reported on 11/24/2014), Disp: 6 tablet, Rfl: 0 .  terconazole (TERAZOL 7) 0.4 % vaginal cream, Place 1 applicator vaginally at bedtime., Disp: 45 g, Rfl: 0 Allergies  Allergen Reactions  . Sulfa Antibiotics Other (See Comments)    Increased bilirubin    Social History  Substance Use Topics  . Smoking status: Never Smoker   . Smokeless tobacco: Not on file  . Alcohol Use: No    Family History  Problem Relation Age of Onset  . Hypertension Mother   . Asthma Son   . Asthma Son   . Asthma Daughter        Review of Systems Constitutional: negative for weight loss Genitourinary:+ for abnormal menstrual periods and vaginal discharge   Objective:   BP 132/78 mmHg  Pulse 116  Temp(Src) 98.6 F (37 C)  Ht 5\' 6"  (1.676 m)  Wt 226 lb (102.513 kg)  BMI 36.49 kg/m2  LMP 12/26/2014   General:   alert  Skin:   no rash or abnormalities  Lungs:   clear to auscultation bilaterally  Heart:   regular rate and  rhythm, S1, S2 normal, no murmur, click, rub or gallop  Breasts:   deferred  Abdomen:  normal findings: no organomegaly, soft, non-tender and no hernia  Pelvis:  deferred   Lab Review Urine pregnancy test Labs reviewed yes Radiologic studies reviewed no  50% of 25 min visit spent on counseling and coordination of care.   Assessment:    44 y.o., starting OCP (estrogen/progesterone), no contraindications.   Plan:    All questions answered.  Meds ordered this encounter  Medications  . oxyCODONE (ROXICODONE) 15 MG immediate release tablet    Sig: Take 15 mg by mouth every 4 (four) hours as needed for pain.  . Norgestimate-Ethinyl Estradiol Triphasic (TRI-SPRINTEC) 0.18/0.215/0.25 MG-35 MCG tablet  Sig: Take 1 tablet by mouth daily.    Dispense:  1 Package    Refill:  11  . fluconazole (DIFLUCAN) 100 MG tablet    Sig: Take 1 tablet (100 mg total) by mouth once. Repeat dose in 48-72 hour.    Dispense:  3 tablet    Refill:  0  . terconazole (TERAZOL 7) 0.4 % vaginal cream    Sig: Place 1 applicator vaginally at bedtime.    Dispense:  45 g    Refill:  0   No orders of the defined types were placed in this encounter.

## 2015-01-02 ENCOUNTER — Ambulatory Visit (HOSPITAL_COMMUNITY)
Admission: RE | Admit: 2015-01-02 | Discharge: 2015-01-02 | Disposition: A | Discharge status: Home / Self Care | Payer: Medicare Other | Source: Ambulatory Visit | Attending: Certified Nurse Midwife | Admitting: Certified Nurse Midwife

## 2015-01-02 DIAGNOSIS — Z124 Encounter for screening for malignant neoplasm of cervix: Secondary | ICD-10-CM | POA: Insufficient documentation

## 2015-01-02 DIAGNOSIS — N939 Abnormal uterine and vaginal bleeding, unspecified: Secondary | ICD-10-CM | POA: Insufficient documentation

## 2015-01-02 DIAGNOSIS — Z1231 Encounter for screening mammogram for malignant neoplasm of breast: Secondary | ICD-10-CM | POA: Diagnosis not present

## 2015-01-02 DIAGNOSIS — Z01419 Encounter for gynecological examination (general) (routine) without abnormal findings: Secondary | ICD-10-CM | POA: Diagnosis not present

## 2015-01-02 DIAGNOSIS — E669 Obesity, unspecified: Secondary | ICD-10-CM

## 2015-01-02 DIAGNOSIS — Z113 Encounter for screening for infections with a predominantly sexual mode of transmission: Secondary | ICD-10-CM | POA: Diagnosis not present

## 2015-01-02 DIAGNOSIS — N946 Dysmenorrhea, unspecified: Secondary | ICD-10-CM | POA: Diagnosis not present

## 2015-01-09 DIAGNOSIS — D57419 Sickle-cell thalassemia with crisis, unspecified: Secondary | ICD-10-CM | POA: Diagnosis not present

## 2015-01-09 DIAGNOSIS — G8929 Other chronic pain: Secondary | ICD-10-CM | POA: Diagnosis not present

## 2015-01-09 DIAGNOSIS — D5740 Sickle-cell thalassemia without crisis: Secondary | ICD-10-CM | POA: Diagnosis not present

## 2015-01-09 DIAGNOSIS — Z79891 Long term (current) use of opiate analgesic: Secondary | ICD-10-CM | POA: Diagnosis not present

## 2015-01-12 ENCOUNTER — Telehealth: Payer: Self-pay

## 2015-01-12 NOTE — Telephone Encounter (Signed)
Misha from [facility] called 01/12/2015 @8:55am saying she has been unable to get in touch with patient to schedule an appointment.   

## 2015-02-01 ENCOUNTER — Other Ambulatory Visit: Payer: Self-pay | Admitting: Family Medicine

## 2015-02-02 ENCOUNTER — Non-Acute Institutional Stay (HOSPITAL_COMMUNITY)
Admission: AD | Admit: 2015-02-02 | Discharge: 2015-02-02 | Disposition: A | Discharge status: Home / Self Care | Payer: Medicare Other | Source: Ambulatory Visit | Attending: Internal Medicine | Admitting: Internal Medicine

## 2015-02-02 ENCOUNTER — Telehealth (HOSPITAL_COMMUNITY): Payer: Self-pay | Admitting: Hematology

## 2015-02-02 DIAGNOSIS — D57219 Sickle-cell/Hb-C disease with crisis, unspecified: Secondary | ICD-10-CM | POA: Diagnosis not present

## 2015-02-02 DIAGNOSIS — D5700 Hb-SS disease with crisis, unspecified: Secondary | ICD-10-CM | POA: Diagnosis not present

## 2015-02-02 DIAGNOSIS — Z791 Long term (current) use of non-steroidal anti-inflammatories (NSAID): Secondary | ICD-10-CM | POA: Diagnosis not present

## 2015-02-02 DIAGNOSIS — F419 Anxiety disorder, unspecified: Secondary | ICD-10-CM | POA: Insufficient documentation

## 2015-02-02 DIAGNOSIS — K219 Gastro-esophageal reflux disease without esophagitis: Secondary | ICD-10-CM | POA: Insufficient documentation

## 2015-02-02 DIAGNOSIS — J45909 Unspecified asthma, uncomplicated: Secondary | ICD-10-CM | POA: Diagnosis not present

## 2015-02-02 DIAGNOSIS — Z7951 Long term (current) use of inhaled steroids: Secondary | ICD-10-CM | POA: Diagnosis not present

## 2015-02-02 DIAGNOSIS — R52 Pain, unspecified: Secondary | ICD-10-CM | POA: Diagnosis present

## 2015-02-02 DIAGNOSIS — D5720 Sickle-cell/Hb-C disease without crisis: Secondary | ICD-10-CM | POA: Diagnosis present

## 2015-02-02 DIAGNOSIS — Z79899 Other long term (current) drug therapy: Secondary | ICD-10-CM | POA: Insufficient documentation

## 2015-02-02 LAB — COMPREHENSIVE METABOLIC PANEL
ALBUMIN: 4.7 g/dL (ref 3.5–5.0)
ALK PHOS: 66 U/L (ref 38–126)
ALT: 10 U/L — ABNORMAL LOW (ref 14–54)
AST: 21 U/L (ref 15–41)
Anion gap: 7 (ref 5–15)
BILIRUBIN TOTAL: 3 mg/dL — AB (ref 0.3–1.2)
BUN: 12 mg/dL (ref 6–20)
CALCIUM: 9.3 mg/dL (ref 8.9–10.3)
CO2: 26 mmol/L (ref 22–32)
Chloride: 106 mmol/L (ref 101–111)
Creatinine, Ser: 0.77 mg/dL (ref 0.44–1.00)
GFR calc Af Amer: >60 mL/min (ref >60)
GFR calc non Af Amer: >60 mL/min (ref >60)
Glucose, Bld: 111 mg/dL — ABNORMAL HIGH (ref 65–99)
Potassium: 3.9 mmol/L (ref 3.5–5.1)
Sodium: 139 mmol/L (ref 135–145)
Total Protein: 8.6 g/dL — ABNORMAL HIGH (ref 6.5–8.1)

## 2015-02-02 LAB — CBC WITH DIFFERENTIAL/PLATELET
BASOS PCT: 0 %
Basophils Absolute: 0 10*3/uL (ref 0.0–0.1)
EOS PCT: 2 %
Eosinophils Absolute: 0.2 10*3/uL (ref 0.0–0.7)
HEMATOCRIT: 32 % — AB (ref 36.0–46.0)
HEMOGLOBIN: 11.1 g/dL — AB (ref 12.0–15.0)
LYMPHS PCT: 26 %
Lymphs Abs: 2.5 10*3/uL (ref 0.7–4.0)
MCH: 25.8 pg — ABNORMAL LOW (ref 26.0–34.0)
MCHC: 34.7 g/dL (ref 30.0–36.0)
MCV: 74.4 fL — AB (ref 78.0–100.0)
Monocytes Absolute: 0.5 10*3/uL (ref 0.1–1.0)
Monocytes Relative: 5 %
NEUTROS PCT: 67 %
Neutro Abs: 6.3 10*3/uL (ref 1.7–7.7)
Platelets: 146 10*3/uL — ABNORMAL LOW (ref 150–400)
RBC: 4.3 MIL/uL (ref 3.87–5.11)
RDW: 16.6 % — ABNORMAL HIGH (ref 11.5–15.5)
WBC: 9.5 10*3/uL (ref 4.0–10.5)

## 2015-02-02 LAB — LACTATE DEHYDROGENASE: LDH: 204 U/L — ABNORMAL HIGH (ref 98–192)

## 2015-02-02 MED ORDER — SODIUM CHLORIDE 0.9 % IV SOLN
12.5000 mg | Freq: Four times a day (QID) | INTRAVENOUS | Status: DC | PRN
  Administered 2015-02-02: 12.5 mg via INTRAVENOUS
  Filled 2015-02-02 (×3): qty 0.25

## 2015-02-02 MED ORDER — SODIUM CHLORIDE 0.9 % IJ SOLN: 9.0000 mL | INTRAMUSCULAR | Status: DC | PRN

## 2015-02-02 MED ORDER — ONDANSETRON HCL 4 MG/2ML IJ SOLN: 4.0000 mg | Freq: Four times a day (QID) | INTRAMUSCULAR | Status: DC | PRN

## 2015-02-02 MED ORDER — HYDROMORPHONE 2 MG/ML HIGH CONCENTRATION IV PCA SOLN
INTRAVENOUS | Status: DC
  Administered 2015-02-02: 14:00:00 via INTRAVENOUS
  Administered 2015-02-02: 9.8 mg via INTRAVENOUS
  Filled 2015-02-02: qty 25

## 2015-02-02 MED ORDER — HYDROMORPHONE HCL 2 MG/ML IJ SOLN
1.0000 mg | Freq: Once | INTRAMUSCULAR | Status: AC
  Administered 2015-02-02: 1 mg via SUBCUTANEOUS
  Filled 2015-02-02: qty 1

## 2015-02-02 MED ORDER — DEXTROSE-NACL 5-0.45 % IV SOLN
INTRAVENOUS | Status: DC
  Administered 2015-02-02: 14:00:00 via INTRAVENOUS

## 2015-02-02 MED ORDER — DIPHENHYDRAMINE HCL 12.5 MG/5ML PO ELIX
12.5000 mg | ORAL_SOLUTION | Freq: Four times a day (QID) | ORAL | Status: DC | PRN
  Administered 2015-02-02: 12.5 mg via ORAL
  Filled 2015-02-02: qty 5

## 2015-02-02 MED ORDER — KETOROLAC TROMETHAMINE 30 MG/ML IJ SOLN
30.0000 mg | Freq: Once | INTRAMUSCULAR | Status: AC
  Administered 2015-02-02: 30 mg via INTRAVENOUS
  Filled 2015-02-02: qty 1

## 2015-02-02 MED ORDER — OXYCODONE HCL 5 MG PO TABS
15.0000 mg | ORAL_TABLET | Freq: Once | ORAL | Status: AC
  Administered 2015-02-02: 15 mg via ORAL
  Filled 2015-02-02: qty 3

## 2015-02-02 MED ORDER — NALOXONE HCL 0.4 MG/ML IJ SOLN: 0.4000 mg | INTRAMUSCULAR | Status: DC | PRN

## 2015-02-02 NOTE — Discharge Summary (Signed)
[facility] Discharge Summary   Patient ID: Cassie Montgomery MRN: [id number] DOB/AGE: 45/04/1971 45 y.o.  Admit date: 02/02/2015 Discharge date: 02/02/2015  Primary Care Physician:  MATTHEWS,MICHELLE A., MD  Admission Diagnoses:  Active Problems:   Sickle cell anemia with pain   Discharge Medications:    Medication List    ASK your doctor about these medications        albuterol 108 (90 BASE) MCG/ACT inhaler  Commonly known as:  PROVENTIL HFA;VENTOLIN HFA  Inhale 2 puffs into the lungs every 6 (six) hours as needed for wheezing or shortness of breath (wheezing).     busPIRone 7.5 MG tablet  Commonly known as:  BUSPAR  TAKE 1 TABLET BY MOUTH 3 TIMES A DAY     busPIRone 7.5 MG tablet  Commonly known as:  BUSPAR  TAKE 1 TABLET BY MOUTH 3 TIMES A DAY     butalbital-aspirin-caffeine 50-325-40 MG per capsule  Commonly known as:  FIORINAL  Take 1 capsule by mouth every 4 (four) hours as needed for headache.     fluconazole 100 MG tablet  Commonly known as:  DIFLUCAN  Take 1 tablet (100 mg total) by mouth once. Repeat dose in 48-72 hour.     fluticasone 50 MCG/ACT nasal spray  Commonly known as:  FLONASE  Place 2 sprays into both nostrils daily as needed for allergies or rhinitis (allergies).     folic acid 1 MG tablet  Commonly known as:  FOLVITE  Take 1 tablet (1 mg total) by mouth every morning.     hydroxyurea 500 MG capsule  Commonly known as:  HYDREA  Take 1 capsule (500 mg total) by mouth daily. May take with food to minimize GI side effects.     ibuprofen 600 MG tablet  Commonly known as:  ADVIL,MOTRIN  Take 1 tablet (600 mg total) by mouth every 8 (eight) hours as needed for moderate pain.     LORazepam 0.5 MG tablet  Commonly known as:  ATIVAN  Take 1 tablet (0.5 mg total) by mouth every 8 (eight) hours.     methadone 5 MG tablet  Commonly known as:  DOLOPHINE  Take 1 tablet (5 mg total) by mouth at bedtime.     metroNIDAZOLE 500 MG  tablet  Commonly known as:  FLAGYL  Take 1 tablet (500 mg total) by mouth 2 (two) times daily.     morphine 15 MG tablet  Commonly known as:  MSIR  Take 1 tablet (15 mg total) by mouth every 6 (six) hours as needed for severe pain.     Norgestimate-Ethinyl Estradiol Triphasic 0.18/0.215/0.25 MG-35 MCG tablet  Commonly known as:  TRI-SPRINTEC  Take 1 tablet by mouth daily.     ondansetron 4 MG tablet  Commonly known as:  ZOFRAN  Take 1 tablet (4 mg total) by mouth every 8 (eight) hours as needed for nausea or vomiting.     oxyCODONE 15 MG immediate release tablet  Commonly known as:  ROXICODONE  Take 15 mg by mouth every 4 (four) hours as needed for pain.     polyethylene glycol packet  Commonly known as:  MIRALAX / GLYCOLAX  Take 17 g by mouth daily as needed for mild constipation.     terconazole 0.4 % vaginal cream  Commonly known as:  TERAZOL 7  Place 1 applicator vaginally at bedtime.         Consults:  None  Significant Diagnostic Studies:  No results found.  [facility] Course: Patient was admitted to the day infusion center for extended observation. Was initially unable to gain IV access, administered Dilaudid 1 mg subcutaneously. Patient was started on high concentration PCA, she used a total of 9.8 mg with 18 demands and 14 deliveries. She was given Oxycodone IR 15 mg 45 minutes prior to discharge. Patient alert, oriented, and ambulatory. Patient will discharge home on current medication regimen.  Will discharge home in stable condition. Patient advised to follow up in office as scheduled.   Physical Exam at Discharge:   BP 112/60 mmHg  Pulse 84  Temp(Src) 98.1 F (36.7 C) (Oral)  Resp 10  SpO2 96%  LMP 01/25/2015  General Appearance:    Alert, cooperative, no distress, appears stated age  Head:    Normocephalic, without obvious abnormality, atraumatic  Eyes:    PERRL, conjunctiva/corneas clear, EOM's intact, fundi    benign, both eyes   Ears:    Normal TM's and external ear canals, both ears  Nose:   Nares normal, septum midline, mucosa normal, no drainage    or sinus tenderness  Throat:   Lips, mucosa, and tongue normal; teeth and gums normal  Neck:   Supple, symmetrical, trachea midline, no adenopathy;    thyroid:  no enlargement/tenderness/nodules; no carotid   bruit or JVD  Back:     Symmetric, no curvature, ROM normal, no CVA tenderness  Lungs:     Clear to auscultation bilaterally, respirations unlabored  Chest Wall:    No tenderness or deformity   Heart:    Regular rate and rhythm, S1 and S2 normal, no murmur, rub   or gallop  Abdomen:     Soft, non-tender, bowel sounds active all four quadrants,    no masses, no organomegaly  Extremities:   Extremities normal, atraumatic, no cyanosis or edema  Pulses:   2+ and symmetric all extremities  Skin:   Skin color, texture, turgor normal, no rashes or lesions  Lymph nodes:   Cervical, supraclavicular, and axillary nodes normal  Neurologic:   CNII-XII intact, normal strength, sensation and reflexes    throughout    Disposition at Discharge: 01-Home or Self Care  Discharge Orders:   Condition at Discharge:   Stable  Time spent on Discharge:  Greater than 30 minutes.  Signed: Hollis,Lachina M 02/02/2015, 4:56 PM

## 2015-02-02 NOTE — Progress Notes (Signed)
Patient came to day hospital for treatment. Rates pain 7/10 at discharge. Instructions reviewed. Patient alert, oriented, and ambulatory at discharge. No complaints.

## 2015-02-02 NOTE — H&P (Signed)
[facility] History and Physical   Date: 02/02/2015  Patient name: Cassie Montgomery Medical record number: [id number] Date of birth: [birth year] Age: 44 y.o. Gender: female PCP: MATTHEWS,MICHELLE A., MD  Attending physician: Tresa Garter, MD  Chief Complaint: generalized pain  History of Present Illness: [name], a 44 year old patient with a history of sickle cell anemia, HbSC presents with a 2 day history of generalized pain. Patient attributes current pain crisis to increase stress. Patient is having to move out of current apartment with short notice. She describes pain as constant and throbbing primarily to lower extremities. Patient states that she is taking prescribed medications consistently, however, Hydrea was discontinued in July.  [name] maintains that current pain intensity is 7/10 unrelieved by Oxycodone IR 15 mg last taken around 0930 am. She reports increased fatigue. [name] denies headache, chest pains, shortness of breath, abdominal pain, urinary problems, nausea, vomiting, or diarrhea.     Meds: Prescriptions prior to admission  Medication Sig Dispense Refill Last Dose  . busPIRone (BUSPAR) 7.5 MG tablet TAKE 1 TABLET BY MOUTH 3 TIMES A DAY 60 tablet 0 02/02/2015 at Unknown time  . ibuprofen (ADVIL,MOTRIN) 600 MG tablet Take 1 tablet (600 mg total) by mouth every 8 (eight) hours as needed for moderate pain. 30 tablet 1 02/01/2015 at Unknown time  . oxyCODONE (ROXICODONE) 15 MG immediate release tablet Take 15 mg by mouth every 4 (four) hours as needed for pain.   02/02/2015 at Unknown time  . albuterol (PROVENTIL HFA;VENTOLIN HFA) 108 (90 BASE) MCG/ACT inhaler Inhale 2 puffs into the lungs every 6 (six) hours as needed for wheezing or shortness of breath (wheezing).    More than a month at Unknown time  . busPIRone (BUSPAR) 7.5 MG tablet TAKE 1 TABLET BY MOUTH 3 TIMES A DAY 60 tablet 0 Taking  . butalbital-aspirin-caffeine (FIORINAL)  50-325-40 MG per capsule Take 1 capsule by mouth every 4 (four) hours as needed for headache. (Patient not taking: Reported on 12/28/2014) 30 capsule 0 More than a month at Unknown time  . fluconazole (DIFLUCAN) 100 MG tablet Take 1 tablet (100 mg total) by mouth once. Repeat dose in 48-72 hour. 3 tablet 0   . fluticasone (FLONASE) 50 MCG/ACT nasal spray Place 2 sprays into both nostrils daily as needed for allergies or rhinitis (allergies).    Unknown at Unknown time  . folic acid (FOLVITE) 1 MG tablet Take 1 tablet (1 mg total) by mouth every morning. 30 tablet 11 More than a month at Unknown time  . hydroxyurea (HYDREA) 500 MG capsule Take 1 capsule (500 mg total) by mouth daily. May take with food to minimize GI side effects. (Patient not taking: Reported on 11/24/2014) 30 capsule 0 More than a month at Unknown time  . LORazepam (ATIVAN) 0.5 MG tablet Take 1 tablet (0.5 mg total) by mouth every 8 (eight) hours. (Patient not taking: Reported on 12/28/2014) 30 tablet 0 More than a month at Unknown time  . methadone (DOLOPHINE) 5 MG tablet Take 1 tablet (5 mg total) by mouth at bedtime. (Patient not taking: Reported on 12/28/2014) 30 tablet 0 Unknown at Unknown time  . metroNIDAZOLE (FLAGYL) 500 MG tablet Take 1 tablet (500 mg total) by mouth 2 (two) times daily. (Patient not taking: Reported on 12/28/2014) 14 tablet 0 Unknown at Unknown time  . morphine (MSIR) 15 MG tablet Take 1 tablet (15 mg total) by mouth every 6 (six) hours as  needed for severe pain. (Patient not taking: Reported on 12/28/2014) 90 tablet 0 Unknown at Unknown time  . Norgestimate-Ethinyl Estradiol Triphasic (TRI-SPRINTEC) 0.18/0.215/0.25 MG-35 MCG tablet Take 1 tablet by mouth daily. 1 Package 11 Unknown at Unknown time  . ondansetron (ZOFRAN) 4 MG tablet Take 1 tablet (4 mg total) by mouth every 8 (eight) hours as needed for nausea or vomiting. (Patient not taking: Reported on 11/24/2014) 6 tablet 0 Unknown at Unknown time  . polyethylene  glycol (MIRALAX / GLYCOLAX) packet Take 17 g by mouth daily as needed for mild constipation.    More than a month at Unknown time  . terconazole (TERAZOL 7) 0.4 % vaginal cream Place 1 applicator vaginally at bedtime. 45 g 0 Unknown at Unknown time    Allergies: Sulfa antibiotics Past Medical History  Diagnosis Date  . PMDD (premenstrual dysphoric disorder)   . Sickle cell anemia   . Sickle cell disease without crisis 07/15/2011  . Migraine with aura 07/15/2011  . GERD (gastroesophageal reflux disease)   . Asthma in adult     probable  . Depression   . Chronic pain syndrome   . Left arm pain     chronic  . Anxiety   . Ganglion cyst    Past Surgical History  Procedure Laterality Date  . Cesarean section      2   Family History  Problem Relation Age of Onset  . Hypertension Mother   . Asthma Son   . Asthma Son   . Asthma Daughter    Social History   Social History  . Marital Status: Single    Spouse Name: N/A  . Number of Children: N/A  . Years of Education: N/A   Occupational History  . Not on file.   Social History Main Topics  . Smoking status: Never Smoker   . Smokeless tobacco: Not on file  . Alcohol Use: No  . Drug Use: No  . Sexual Activity: Yes   Other Topics Concern  . Not on file   Social History Narrative   Uses a cane occasionally.  Does not work.      Review of Systems: Constitutional: positive for fatigue, negative for chills, fevers and night sweats Eyes: positive for icterus Ears, nose, mouth, throat, and face: negative Respiratory: negative for cough, sputum and wheezing Cardiovascular: negative for chest pain, dyspnea, lower extremity edema and tachypnea Gastrointestinal: negative Genitourinary:negative for frequency, hematuria and urinary incontinence Integument/breast: negative Hematologic/lymphatic: negative Musculoskeletal:positive for myalgias Neurological: negative Behavioral/Psych: negative Endocrine:  negative Allergic/Immunologic: negative  Physical Exam: Last menstrual period 01/25/2015. LMP 01/25/2015  General Appearance:    Alert, cooperative, no distress, appears stated age  Head:    Normocephalic, without obvious abnormality, atraumatic  Eyes:    PERRL, conjunctiva/corneas clear, EOM's intact, fundi    benign, both eyes  Ears:    Normal TM's and external ear canals, both ears  Nose:   Nares normal, septum midline, mucosa normal, no drainage    or sinus tenderness  Throat:   Lips, mucosa, and tongue normal; teeth and gums normal  Neck:   Supple, symmetrical, trachea midline, no adenopathy;    thyroid:  no enlargement/tenderness/nodules; no carotid   bruit or JVD  Back:     Symmetric, no curvature, ROM normal, no CVA tenderness  Lungs:     Clear to auscultation bilaterally, respirations unlabored  Chest Wall:    No tenderness or deformity   Heart:    Regular rate and rhythm,  S1 and S2 normal, no murmur, rub   or gallop  Abdomen:     Soft, non-tender, bowel sounds active all four quadrants,    no masses, no organomegaly  Extremities:   Extremities normal, atraumatic, no cyanosis or edema  Pulses:   2+ and symmetric all extremities  Skin:   Skin color, texture, turgor normal, no rashes or lesions  Lymph nodes:   Cervical, supraclavicular, and axillary nodes normal  Neurologic:   CNII-XII intact, normal strength, sensation and reflexes    throughout   Lab results: No results found for this or any previous visit (from the past 24 hour(s)).  Imaging results:  No results found.   Assessment & Plan:   Patient will be admitted to the day infusion center for extended observation   IV D5.45 for cellular rehydration at 125/hr   Toradol 30 mg IV every 6 hours for inflammation.  Patient opiod tolerant. Start Dilaudid PCA High Concentration per weight based protocol.   Patient will be re-evaluated for pain intensity in the context of function and relationship to baseline as  care progresses.  If no significant pain relief, will transfer patient to inpatient services for a higher level of care.   Will check CMP,  LDH and CBC w/differential Hollis,Lachina M 02/02/2015, 12:05 PM

## 2015-02-02 NOTE — Telephone Encounter (Signed)
Patient C/O pain to arms, hands, shoulders, and legs.  Rate pain 7/10 on pain scale.  Increasing for several days.  Patient took 15mg  oxycodone around 0930.  Patient states she has been taking this as prescribed.  Patient denies N/V, abdominal pain, fever, chest pain or shortness of breath.  Placed caller on hold and discussed with NP.  Per Cassie Montgomery, patient can come to [facility].  Patient verbalizes understanding.

## 2015-02-04 ENCOUNTER — Other Ambulatory Visit: Payer: Self-pay | Admitting: Family Medicine

## 2015-02-06 ENCOUNTER — Ambulatory Visit: Payer: Medicare Other | Admitting: Dietician

## 2015-02-06 DIAGNOSIS — G8929 Other chronic pain: Secondary | ICD-10-CM | POA: Diagnosis not present

## 2015-02-06 DIAGNOSIS — M25511 Pain in right shoulder: Secondary | ICD-10-CM | POA: Diagnosis not present

## 2015-02-06 DIAGNOSIS — Z79891 Long term (current) use of opiate analgesic: Secondary | ICD-10-CM | POA: Diagnosis not present

## 2015-02-06 DIAGNOSIS — M25512 Pain in left shoulder: Secondary | ICD-10-CM | POA: Diagnosis not present

## 2015-02-06 DIAGNOSIS — M25561 Pain in right knee: Secondary | ICD-10-CM | POA: Diagnosis not present

## 2015-02-06 DIAGNOSIS — M25562 Pain in left knee: Secondary | ICD-10-CM | POA: Diagnosis not present

## 2015-02-06 DIAGNOSIS — M545 Low back pain: Secondary | ICD-10-CM | POA: Diagnosis not present

## 2015-02-09 DIAGNOSIS — M545 Low back pain: Secondary | ICD-10-CM | POA: Diagnosis not present

## 2015-02-09 DIAGNOSIS — G8929 Other chronic pain: Secondary | ICD-10-CM | POA: Diagnosis not present

## 2015-02-09 DIAGNOSIS — M542 Cervicalgia: Secondary | ICD-10-CM | POA: Diagnosis not present

## 2015-02-09 DIAGNOSIS — M546 Pain in thoracic spine: Secondary | ICD-10-CM | POA: Diagnosis not present

## 2015-02-22 ENCOUNTER — Ambulatory Visit: Payer: Medicare Other | Admitting: Family Medicine

## 2015-02-22 DIAGNOSIS — F411 Generalized anxiety disorder: Secondary | ICD-10-CM | POA: Diagnosis not present

## 2015-02-22 DIAGNOSIS — F4320 Adjustment disorder, unspecified: Secondary | ICD-10-CM | POA: Diagnosis not present

## 2015-02-23 DIAGNOSIS — M542 Cervicalgia: Secondary | ICD-10-CM | POA: Diagnosis not present

## 2015-02-23 DIAGNOSIS — M545 Low back pain: Secondary | ICD-10-CM | POA: Diagnosis not present

## 2015-02-23 DIAGNOSIS — G8929 Other chronic pain: Secondary | ICD-10-CM | POA: Diagnosis not present

## 2015-02-23 DIAGNOSIS — M546 Pain in thoracic spine: Secondary | ICD-10-CM | POA: Diagnosis not present

## 2015-02-24 ENCOUNTER — Ambulatory Visit: Payer: Medicare Other | Admitting: Dietician

## 2015-03-22 ENCOUNTER — Emergency Department (HOSPITAL_COMMUNITY)
Admission: EM | Admit: 2015-03-22 | Discharge: 2015-03-22 | Disposition: A | Discharge status: Home / Self Care | Payer: Medicare Other | Attending: Emergency Medicine | Admitting: Emergency Medicine

## 2015-03-22 ENCOUNTER — Encounter (HOSPITAL_COMMUNITY): Payer: Self-pay | Admitting: Emergency Medicine

## 2015-03-22 ENCOUNTER — Emergency Department (HOSPITAL_COMMUNITY): Payer: Medicare Other

## 2015-03-22 DIAGNOSIS — R05 Cough: Secondary | ICD-10-CM | POA: Diagnosis not present

## 2015-03-22 DIAGNOSIS — Z8739 Personal history of other diseases of the musculoskeletal system and connective tissue: Secondary | ICD-10-CM | POA: Diagnosis not present

## 2015-03-22 DIAGNOSIS — G894 Chronic pain syndrome: Secondary | ICD-10-CM | POA: Diagnosis not present

## 2015-03-22 DIAGNOSIS — Z8719 Personal history of other diseases of the digestive system: Secondary | ICD-10-CM | POA: Insufficient documentation

## 2015-03-22 DIAGNOSIS — F329 Major depressive disorder, single episode, unspecified: Secondary | ICD-10-CM | POA: Insufficient documentation

## 2015-03-22 DIAGNOSIS — F419 Anxiety disorder, unspecified: Secondary | ICD-10-CM | POA: Diagnosis not present

## 2015-03-22 DIAGNOSIS — J45901 Unspecified asthma with (acute) exacerbation: Secondary | ICD-10-CM | POA: Insufficient documentation

## 2015-03-22 DIAGNOSIS — Z79899 Other long term (current) drug therapy: Secondary | ICD-10-CM | POA: Diagnosis not present

## 2015-03-22 DIAGNOSIS — D5700 Hb-SS disease with crisis, unspecified: Secondary | ICD-10-CM | POA: Diagnosis not present

## 2015-03-22 DIAGNOSIS — Z8679 Personal history of other diseases of the circulatory system: Secondary | ICD-10-CM | POA: Diagnosis not present

## 2015-03-22 LAB — COMPREHENSIVE METABOLIC PANEL
ALK PHOS: 64 U/L (ref 38–126)
ALT: 8 U/L — ABNORMAL LOW (ref 14–54)
AST: 17 U/L (ref 15–41)
Albumin: 4.3 g/dL (ref 3.5–5.0)
Anion gap: 9 (ref 5–15)
BILIRUBIN TOTAL: 4.3 mg/dL — AB (ref 0.3–1.2)
BUN: 11 mg/dL (ref 6–20)
CALCIUM: 9.2 mg/dL (ref 8.9–10.3)
CHLORIDE: 105 mmol/L (ref 101–111)
CO2: 24 mmol/L (ref 22–32)
CREATININE: 0.83 mg/dL (ref 0.44–1.00)
Glucose, Bld: 161 mg/dL — ABNORMAL HIGH (ref 65–99)
Potassium: 3.2 mmol/L — ABNORMAL LOW (ref 3.5–5.1)
Sodium: 138 mmol/L (ref 135–145)
TOTAL PROTEIN: 7.8 g/dL (ref 6.5–8.1)

## 2015-03-22 LAB — CBC WITH DIFFERENTIAL/PLATELET
BASOS ABS: 0.1 10*3/uL (ref 0.0–0.1)
Basophils Relative: 1 %
EOS PCT: 5 %
Eosinophils Absolute: 0.5 10*3/uL (ref 0.0–0.7)
HEMATOCRIT: 31.7 % — AB (ref 36.0–46.0)
Hemoglobin: 10.8 g/dL — ABNORMAL LOW (ref 12.0–15.0)
LYMPHS PCT: 16 %
Lymphs Abs: 1.7 10*3/uL (ref 0.7–4.0)
MCH: 25.2 pg — AB (ref 26.0–34.0)
MCHC: 34.1 g/dL (ref 30.0–36.0)
MCV: 73.9 fL — AB (ref 78.0–100.0)
MONO ABS: 0.7 10*3/uL (ref 0.1–1.0)
MONOS PCT: 7 %
Neutro Abs: 8 10*3/uL — ABNORMAL HIGH (ref 1.7–7.7)
Neutrophils Relative %: 72 %
PLATELETS: 139 10*3/uL — AB (ref 150–400)
RBC: 4.29 MIL/uL (ref 3.87–5.11)
RDW: 16.3 % — AB (ref 11.5–15.5)
WBC: 11 10*3/uL — ABNORMAL HIGH (ref 4.0–10.5)

## 2015-03-22 LAB — RETICULOCYTES
RBC.: 4.29 MIL/uL (ref 3.87–5.11)
RETIC CT PCT: 5 % — AB (ref 0.4–3.1)
Retic Count, Absolute: 214.5 10*3/uL — ABNORMAL HIGH (ref 19.0–186.0)

## 2015-03-22 MED ORDER — HYDROMORPHONE HCL 1 MG/ML IJ SOLN
1.0000 mg | Freq: Once | INTRAMUSCULAR | Status: AC
  Administered 2015-03-22: 1 mg via INTRAVENOUS
  Filled 2015-03-22: qty 1

## 2015-03-22 MED ORDER — ALBUTEROL SULFATE (2.5 MG/3ML) 0.083% IN NEBU
5.0000 mg | INHALATION_SOLUTION | Freq: Once | RESPIRATORY_TRACT | Status: AC
  Administered 2015-03-22: 5 mg via RESPIRATORY_TRACT
  Filled 2015-03-22: qty 6

## 2015-03-22 MED ORDER — HYDROCODONE-HOMATROPINE 5-1.5 MG/5ML PO SYRP
5.0000 mL | ORAL_SOLUTION | ORAL | Status: DC | PRN
  Administered 2015-03-22: 5 mL via ORAL
  Filled 2015-03-22: qty 5

## 2015-03-22 MED ORDER — HYDROCODONE-HOMATROPINE 5-1.5 MG/5ML PO SYRP: 5.0000 mL | ORAL_SOLUTION | Freq: Four times a day (QID) | ORAL | Status: DC | PRN

## 2015-03-22 NOTE — Discharge Instructions (Signed)
Sickle Cell Anemia, Adult Sickle cell anemia is a condition where your red blood cells are shaped like sickles. Red blood cells carry oxygen through the body. Sickle-shaped red blood cells do not live as long as normal red blood cells. They also clump together and block blood from flowing through the blood vessels. These things prevent the body from getting enough oxygen. Sickle cell anemia causes organ damage and pain. It also increases the risk of infection. HOME CARE  Drink enough fluid to keep your pee (urine) clear or pale yellow. Drink more in hot weather and during exercise.  Do not smoke. Smoking lowers oxygen levels in the blood.  Only take over-the-counter or prescription medicines as told by your doctor.  Take antibiotic medicines as told by your doctor. Make sure you finish them even if you start to feel better.  Take supplements as told by your doctor.  Consider wearing a medical alert bracelet. This tells anyone caring for you in an emergency of your condition.  When traveling, keep your medical information, doctors' names, and the medicines you take with you at all times.  If you have a fever, do not take fever medicines right away. This could cover up a problem. Tell your doctor.   Keep all follow-up visits with your doctor. Sickle cell anemia requires regular medical care. GET HELP IF: You have a fever. GET HELP RIGHT AWAY IF:  You feel dizzy or faint.  You have new belly (abdominal) pain, especially on the left side near the stomach area.  You have a lasting, often uncomfortable and painful erection of the penis (priapism). If it is not treated right away, you will become unable to have sex (impotence).  You have numbness in your arms or legs or you have a hard time moving them.  You have a hard time talking.  You have a fever or lasting symptoms for more than 2-3 days.  You have a fever and your symptoms suddenly get worse.  You have signs or symptoms of  infection. These include:  Chills.  Being more tired than normal (lethargy).  Irritability.  Poor eating.  Throwing up (vomiting).  You have pain that is not helped with medicine.  You have shortness of breath.  You have pain in your chest.  You are coughing up pus-like or bloody mucus.  You have a stiff neck.  Your feet or hands swell or have pain.  Your belly looks bloated.  Your joints hurt. MAKE SURE YOU:  Understand these instructions.  Will watch your condition.  Will get help right away if you are not doing well or get worse.   This information is not intended to replace advice given to you by your health care provider. Make sure you discuss any questions you have with your health care provider.   Document Released: 02/17/2013 Document Revised: 09/13/2014 Document Reviewed: 02/17/2013 Elsevier Interactive Patient Education 2016 [employer].  Potassium Content of Foods Potassium is a mineral found in many foods and drinks. It helps keep fluids and minerals balanced in your body and affects how steadily your heart beats. Potassium also helps control your blood pressure and keep your muscles and nervous system healthy. Certain health conditions and medicines may change the balance of potassium in your body. When this happens, you can help balance your level of potassium through the foods that you do or do not eat. Your health care provider or dietitian may recommend an amount of potassium that you should have each  day. The following lists of foods provide the amount of potassium (in parentheses) per serving in each item. HIGH IN POTASSIUM  The following foods and beverages have 200 mg or more of potassium per serving:  Apricots, 2 raw or 5 dry (200 mg).  Artichoke, 1 medium (345 mg).  Avocado, raw,  each (245 mg).  Banana, 1 medium (425 mg).  Beans, lima, or baked beans, canned,  cup (280 mg).  Beans, white, canned,  cup (595 mg).  Beef roast, 3 oz  (320 mg).  Beef, ground, 3 oz (270 mg).  Beets, raw or cooked,  cup (260 mg).  Bran muffin, 2 oz (300 mg).  Broccoli,  cup (230 mg).  Brussels sprouts,  cup (250 mg).  Cantaloupe,  cup (215 mg).  Cereal, 100% bran,  cup (200-400 mg).  Cheeseburger, single, fast food, 1 each (225-400 mg).  Chicken, 3 oz (220 mg).  Clams, canned, 3 oz (535 mg).  Crab, 3 oz (225 mg).  Dates, 5 each (270 mg).  Dried beans and peas,  cup (300-475 mg).  Figs, dried, 2 each (260 mg).  Fish: halibut, tuna, cod, snapper, 3 oz (480 mg).  Fish: salmon, haddock, swordfish, perch, 3 oz (300 mg).  Fish, tuna, canned 3 oz (200 mg).  [place] fries, fast food, 3 oz (470 mg).  Granola with fruit and nuts,  cup (200 mg).  Grapefruit juice,  cup (200 mg).  Greens, beet,  cup (655 mg).  Honeydew melon,  cup (200 mg).  Kale, raw, 1 cup (300 mg).  Kiwi, 1 medium (240 mg).  Kohlrabi, rutabaga, parsnips,  cup (280 mg).  Lentils,  cup (365 mg).  Mango, 1 each (325 mg).  Milk, chocolate, 1 cup (420 mg).  Milk: nonfat, low-fat, whole, buttermilk, 1 cup (350-380 mg).  Molasses, 1 Tbsp (295 mg).  Mushrooms,  cup (280) mg.  Nectarine, 1 each (275 mg).  Nuts: almonds, peanuts, hazelnuts, [place], cashew, mixed, 1 oz (200 mg).  Nuts, pistachios, 1 oz (295 mg).  Orange, 1 each (240 mg).  Orange juice,  cup (235 mg).  Papaya, medium,  fruit (390 mg).  Peanut butter, chunky, 2 Tbsp (240 mg).  Peanut butter, smooth, 2 Tbsp (210 mg).  Pear, 1 medium (200 mg).  Pomegranate, 1 whole (400 mg).  Pomegranate juice,  cup (215 mg).  Pork, 3 oz (350 mg).  Potato chips, salted, 1 oz (465 mg).  Potato, baked with skin, 1 medium (925 mg).  Potatoes, boiled,  cup (255 mg).  Potatoes, mashed,  cup (330 mg).  Prune juice,  cup (370 mg).  Prunes, 5 each (305 mg).  Pudding, chocolate,  cup (230 mg).  Pumpkin, canned,  cup (250 mg).  Raisins, seedless,  cup (270  mg).  Seeds, sunflower or pumpkin, 1 oz (240 mg).  Soy milk, 1 cup (300 mg).  Spinach,  cup (420 mg).  Spinach, canned,  cup (370 mg).  Sweet potato, baked with skin, 1 medium (450 mg).  Swiss chard,  cup (480 mg).  Tomato or vegetable juice,  cup (275 mg).  Tomato sauce or puree,  cup (400-550 mg).  Tomato, raw, 1 medium (290 mg).  Tomatoes, canned,  cup (200-300 mg).  [place], 3 oz (250 mg).  Wheat germ, 1 oz (250 mg).  Winter squash,  cup (250 mg).  Yogurt, plain or fruited, 6 oz (260-435 mg).  Zucchini,  cup (220 mg). MODERATE IN POTASSIUM The following foods and beverages have 50-200 mg of potassium per  serving:  Apple, 1 each (150 mg).  Apple juice,  cup (150 mg).  Applesauce,  cup (90 mg).  Apricot nectar,  cup (140 mg).  Asparagus, small spears,  cup or 6 spears (155 mg).  Bagel, cinnamon raisin, 1 each (130 mg).  Bagel, egg or plain, 4 in., 1 each (70 mg).  Beans, green,  cup (90 mg).  Beans, yellow,  cup (190 mg).  Beer, regular, 12 oz (100 mg).  Beets, canned,  cup (125 mg).  Blackberries,  cup (115 mg).  Blueberries,  cup (60 mg).  Bread, whole wheat, 1 slice (70 mg).  Broccoli, raw,  cup (145 mg).  Cabbage,  cup (150 mg).  Carrots, cooked or raw,  cup (180 mg).  Cauliflower, raw,  cup (150 mg).  Celery, raw,  cup (155 mg).  Cereal, bran flakes, cup (120-150 mg).  Cheese, cottage,  cup (110 mg).  Cherries, 10 each (150 mg).  Chocolate, 1 oz bar (165 mg).  Coffee, brewed 6 oz (90 mg).  Corn,  cup or 1 ear (195 mg).  Cucumbers,  cup (80 mg).  Egg, large, 1 each (60 mg).  Eggplant,  cup (60 mg).  Endive, raw, cup (80 mg).  English muffin, 1 each (65 mg).  Fish, orange roughy, 3 oz (150 mg).  Frankfurter, beef or pork, 1 each (75 mg).  Fruit cocktail,  cup (115 mg).  Grape juice,  cup (170 mg).  Grapefruit,  fruit (175 mg).  Grapes,  cup (155 mg).  Greens: kale, turnip,  collard,  cup (110-150 mg).  Ice cream or frozen yogurt, chocolate,  cup (175 mg).  Ice cream or frozen yogurt, vanilla,  cup (120-150 mg).  Lemons, limes, 1 each (80 mg).  Lettuce, all types, 1 cup (100 mg).  Mixed vegetables,  cup (150 mg).  Mushrooms, raw,  cup (110 mg).  Nuts: walnuts, pecans, or macadamia, 1 oz (125 mg).  Oatmeal,  cup (80 mg).  Okra,  cup (110 mg).  Onions, raw,  cup (120 mg).  Peach, 1 each (185 mg).  Peaches, canned,  cup (120 mg).  Pears, canned,  cup (120 mg).  Peas, green, frozen,  cup (90 mg).  Peppers, green,  cup (130 mg).  Peppers, red,  cup (160 mg).  Pineapple juice,  cup (165 mg).  Pineapple, fresh or canned,  cup (100 mg).  Plums, 1 each (105 mg).  Pudding, vanilla,  cup (150 mg).  Raspberries,  cup (90 mg).  Rhubarb,  cup (115 mg).  Rice, wild,  cup (80 mg).  Shrimp, 3 oz (155 mg).  Spinach, raw, 1 cup (170 mg).  Strawberries,  cup (125 mg).  Summer squash  cup (175-200 mg).  Swiss chard, raw, 1 cup (135 mg).  Tangerines, 1 each (140 mg).  Tea, brewed, 6 oz (65 mg).  Turnips,  cup (140 mg).  Watermelon,  cup (85 mg).  Wine, red, table, 5 oz (180 mg).  Wine, white, table, 5 oz (100 mg). LOW IN POTASSIUM The following foods and beverages have less than 50 mg of potassium per serving.  Bread, white, 1 slice (30 mg).  Carbonated beverages, 12 oz (less than 5 mg).  Cheese, 1 oz (20-30 mg).  Cranberries,  cup (45 mg).  Cranberry juice cocktail,  cup (20 mg).  Fats and oils, 1 Tbsp (less than 5 mg).  Hummus, 1 Tbsp (32 mg).  Nectar: papaya, mango, or pear,  cup (35 mg).  Rice, white or brown,  cup (  50 mg).  Spaghetti or macaroni,  cup cooked (30 mg).  Tortilla, flour or corn, 1 each (50 mg).  Waffle, 4 in., 1 each (50 mg).  Water chestnuts,  cup (40 mg).   This information is not intended to replace advice given to you by your health care provider. Make sure you  discuss any questions you have with your health care provider.   Document Released: 12/11/2004 Document Revised: 05/04/2013 Document Reviewed: 03/26/2013 Elsevier Interactive Patient Education [employer].

## 2015-03-22 NOTE — ED Provider Notes (Signed)
CSN: [id number]     Arrival date & time 03/22/15  1856 History   First MD Initiated Contact with Patient 03/22/15 0825     Chief Complaint  Patient presents with  . Sickle Cell Pain Crisis     (Consider location/radiation/quality/duration/timing/severity/associated sxs/prior Treatment) The history is provided by the patient.   Patient is a 44yo F with history of sickle cell disease and asthma who presents with 5 days of cough and sickle cell pain. Patient reports that she currently has pain in her bilateral arms and legs which is consistent with her previous sickle cell pain crises. Patient has tried taking her home oxycodone 15mg  IR every 4 hours without significant relief. Patient reports that she has had URI symptoms for about a week, which she believes may have cause her current symptoms. Patient's daughter has URI symptoms as well. Patient endorses cough with occasional yellowish sputum production. Patient also has chest pain with her cough. No fevers or chills. No shortness of breath. No wheezing.   Past Medical History  Diagnosis Date  . PMDD (premenstrual dysphoric disorder)   . Sickle cell anemia (HCC)   . Sickle cell disease without crisis ([place]) 07/15/2011  . Migraine with aura 07/15/2011  . GERD (gastroesophageal reflux disease)   . Asthma in adult     probable  . Depression   . Chronic pain syndrome   . Left arm pain     chronic  . Anxiety   . Ganglion cyst    Past Surgical History  Procedure Laterality Date  . Cesarean section      2   Family History  Problem Relation Age of Onset  . Hypertension Mother   . Asthma Son   . Asthma Son   . Asthma Daughter    Social History  Substance Use Topics  . Smoking status: Never Smoker   . Smokeless tobacco: None  . Alcohol Use: No   OB History    Gravida Para Term Preterm AB TAB SAB Ectopic Multiple Living   3         3     Review of Systems  Constitutional: Negative for fever and chills.  HENT: Positive for  postnasal drip and rhinorrhea.   Eyes: Negative.   Respiratory: Positive for cough. Negative for shortness of breath.   Cardiovascular: Positive for chest pain.  Gastrointestinal: Negative.   Endocrine: Negative.   Genitourinary: Negative.   Musculoskeletal: Positive for myalgias.  Skin: Negative.   Allergic/Immunologic: Negative.   Neurological: Negative.   Hematological: Negative.   Psychiatric/Behavioral: Negative.       Allergies  Hydrea and Sulfa antibiotics  Home Medications   Prior to Admission medications   Medication Sig Start Date End Date Taking? Authorizing Provider  albuterol (PROVENTIL HFA;VENTOLIN HFA) 108 (90 BASE) MCG/ACT inhaler Inhale 2 puffs into the lungs every 6 (six) hours as needed for wheezing or shortness of breath (wheezing).    Yes Historical Provider, MD  Artificial Tear Ointment (DRY EYES OP) Apply 2 drops to eye daily as needed (dry eyes).   Yes Historical Provider, MD  DiphenhydrAMINE HCl (ZZZQUIL) 50 MG/30ML LIQD Take 30 mLs by mouth at bedtime as needed (sleep).   Yes Historical Provider, MD  fluticasone (FLONASE) 50 MCG/ACT nasal spray Place 2 sprays into both nostrils daily as needed for allergies or rhinitis (allergies).    Yes Historical Provider, MD  folic acid (FOLVITE) 1 MG tablet Take 1 tablet (1 mg total) by mouth every morning. 05/26/14  Yes [name], MD  ibuprofen (ADVIL,MOTRIN) 600 MG tablet Take 1 tablet (600 mg total) by mouth every 8 (eight) hours as needed for moderate pain. 11/30/14  Yes Micheline Chapman, NP  oxyCODONE (ROXICODONE) 15 MG immediate release tablet Take 15 mg by mouth every 4 (four) hours as needed for pain.   Yes Historical Provider, MD  polyethylene glycol (MIRALAX / GLYCOLAX) packet Take 17 g by mouth daily as needed for mild constipation.    Yes Historical Provider, MD  busPIRone (BUSPAR) 7.5 MG tablet TAKE 1 TABLET BY MOUTH 3 TIMES A DAY 12/20/14   Micheline Chapman, NP  butalbital-aspirin-caffeine  [facility]) 50-325-40 MG per capsule Take 1 capsule by mouth every 4 (four) hours as needed for headache. Patient not taking: Reported on 12/28/2014 10/25/14   Dorena Dew, FNP  fluconazole (DIFLUCAN) 100 MG tablet Take 1 tablet (100 mg total) by mouth once. Repeat dose in 48-72 hour. Patient not taking: Reported on 03/22/2015 12/28/14   Rachelle A Denney, CNM  HYDROcodone-homatropine (HYCODAN) 5-1.5 MG/5ML syrup Take 5 mLs by mouth every 6 (six) hours as needed for cough. 03/22/15   Vivi Barrack, MD  hydroxyurea (HYDREA) 500 MG capsule Take 1 capsule (500 mg total) by mouth daily. May take with food to minimize GI side effects. Patient not taking: Reported on 11/24/2014 10/17/14   Dorena Dew, FNP  metroNIDAZOLE (FLAGYL) 500 MG tablet Take 1 tablet (500 mg total) by mouth 2 (two) times daily. Patient not taking: Reported on 12/28/2014 12/01/14   Shelly Bombard, MD  Norgestimate-Ethinyl Estradiol Triphasic (TRI-SPRINTEC) 0.18/0.215/0.25 MG-35 MCG tablet Take 1 tablet by mouth daily. 12/28/14   Rachelle A Denney, CNM  terconazole (TERAZOL 7) 0.4 % vaginal cream Place 1 applicator vaginally at bedtime. Patient not taking: Reported on 03/22/2015 12/28/14   Rachelle A Denney, CNM   BP 110/79 mmHg  Pulse 121  Temp(Src) 98.4 F (36.9 C) (Oral)  Resp 18  Ht 5\' 6"  (1.676 m)  Wt 230 lb (104.327 kg)  BMI 37.14 kg/m2  SpO2 96%  LMP 02/25/2015 Physical Exam  Constitutional: She is oriented to person, place, and time. She appears well-developed and well-nourished. No distress.  HENT:  Head: Normocephalic and atraumatic.  Eyes: EOM are normal. Pupils are equal, round, and reactive to light. Scleral icterus is present.  Neck: Normal range of motion. Neck supple.  Cardiovascular: Normal rate, regular rhythm and normal heart sounds.   Pulmonary/Chest: Effort normal. No respiratory distress. She has wheezes.  Abdominal: Soft. Bowel sounds are normal. She exhibits no distension. There is no tenderness.   Musculoskeletal: Normal range of motion. She exhibits tenderness (in upper legs and backs). She exhibits no edema.  Neurological: She is alert and oriented to person, place, and time. No cranial nerve deficit.  Skin: Skin is warm and dry. No rash noted.  Psychiatric: She has a normal mood and affect. Her behavior is normal.    ED Course  Procedures (including critical care time) Labs Review Labs Reviewed  CBC WITH DIFFERENTIAL/PLATELET - Abnormal; Notable for the following:    WBC 11.0 (*)    Hemoglobin 10.8 (*)    HCT 31.7 (*)    MCV 73.9 (*)    MCH 25.2 (*)    RDW 16.3 (*)    Platelets 139 (*)    Neutro Abs 8.0 (*)    All other components within normal limits  COMPREHENSIVE METABOLIC PANEL - Abnormal; Notable for the following:  Potassium 3.2 (*)    Glucose, Bld 161 (*)    ALT 8 (*)    Total Bilirubin 4.3 (*)    All other components within normal limits  RETICULOCYTES - Abnormal; Notable for the following:    Retic Ct Pct 5.0 (*)    Retic Count, Manual 214.5 (*)    All other components within normal limits    Imaging Review Dg Chest 2 View  03/22/2015  CLINICAL DATA:  Sickle cell pain crisis, pt states cough, congestion, mid chest pain for 4-5 days now, not getting any better. nonsmoker EXAM: CHEST  2 VIEW COMPARISON:  11/19/2014 FINDINGS: Numerous leads and wires project over the chest. Midline trachea. Normal heart size and mediastinal contours. No pleural effusion or pneumothorax. Subtle lower lobe predominant interstitial thickening is greater on the right. Felt to be similar back to 06/11/2014, given differences in technique. IMPRESSION: Subtle interstitial thickening, asymmetric at the right lung base. Favored to be chronic. If patient's symptoms persist or worsen, short-term radiographic follow-up could be performed to exclude unlikely developing right lower lobe pneumonia. Electronically Signed   By: Abigail Miyamoto M.D.   On: 03/22/2015 09:18   I have personally  reviewed and evaluated these images and lab results as part of my medical decision-making.   EKG Interpretation   Date/Time:  Wednesday March 22 2015 08:34:08 EST Ventricular Rate:  113 PR Interval:  140 QRS Duration: 95 QT Interval:  359 QTC Calculation: 492 R Axis:   61 Text Interpretation:  Sinus tachycardia Borderline repolarization  abnormality Borderline prolonged QT interval Baseline wander in lead(s) II  III aVF V3 Abnormal ekg Confirmed by BEATON  MD, ROBERT ([id number]) on  03/22/2015 8:42:31 AM      MDM   Final diagnoses:  Sickle cell pain crisis ([place])    8:48 AM Patient is a 44yo F presenting with sickle cell pain and cough in setting of recent URI. Will give 1mg  of dilaudid, check CBC, retic count, CMP, CXR and give breathing treatment.   10:07 AM Pain about the same after 1mg  of dilaudid. CXR with no new infiltrates. Hgb and retic count at baseline. Will give additional dose of dilaudid and give hycodan for cough.   11:04 AM Pain improved. No evidence for acute chest or aplastic crisis. Will discharge home with hycodan for cough. Potassium of 3.2 noted - encouraged patient to eat diet high in potassium. Encouraged follow up with PCP and reviewed return precautions.  Vivi Barrack, MD 03/22/15 1112  Leonard Schwartz, MD 03/22/15 [phone]

## 2015-03-22 NOTE — ED Notes (Signed)
Pt c/o SSC pain in arms, legs and back x 2 days despite taking oxycodone as directed. Pt also c/o cough x 4-5 days, unrelenting, preventing sleep. Dry cough noted.

## 2015-03-27 DIAGNOSIS — M545 Low back pain: Secondary | ICD-10-CM | POA: Diagnosis not present

## 2015-03-27 DIAGNOSIS — M25512 Pain in left shoulder: Secondary | ICD-10-CM | POA: Diagnosis not present

## 2015-03-27 DIAGNOSIS — Z79891 Long term (current) use of opiate analgesic: Secondary | ICD-10-CM | POA: Diagnosis not present

## 2015-03-27 DIAGNOSIS — G8929 Other chronic pain: Secondary | ICD-10-CM | POA: Diagnosis not present

## 2015-03-27 DIAGNOSIS — M25511 Pain in right shoulder: Secondary | ICD-10-CM | POA: Diagnosis not present

## 2015-03-27 DIAGNOSIS — D5740 Sickle-cell thalassemia without crisis: Secondary | ICD-10-CM | POA: Diagnosis not present

## 2015-03-31 ENCOUNTER — Ambulatory Visit (INDEPENDENT_AMBULATORY_CARE_PROVIDER_SITE_OTHER): Payer: Medicare Other | Admitting: Family Medicine

## 2015-03-31 VITALS — BP 132/82 | HR 108 | Temp 98.1°F | Resp 16 | Ht 66.0 in | Wt 235.0 lb

## 2015-03-31 DIAGNOSIS — D5700 Hb-SS disease with crisis, unspecified: Secondary | ICD-10-CM

## 2015-03-31 DIAGNOSIS — Z8709 Personal history of other diseases of the respiratory system: Secondary | ICD-10-CM

## 2015-03-31 DIAGNOSIS — J069 Acute upper respiratory infection, unspecified: Secondary | ICD-10-CM

## 2015-03-31 LAB — COMPLETE METABOLIC PANEL WITH GFR
ALBUMIN: 4.1 g/dL (ref 3.6–5.1)
ALK PHOS: 57 U/L (ref 33–115)
ALT: 9 U/L (ref 6–29)
AST: 15 U/L (ref 10–35)
BILIRUBIN TOTAL: 5.3 mg/dL — AB (ref 0.2–1.2)
BUN: 6 mg/dL — AB (ref 7–25)
CO2: 28 mmol/L (ref 20–31)
CREATININE: 0.75 mg/dL (ref 0.50–1.10)
Calcium: 8.9 mg/dL (ref 8.6–10.2)
Chloride: 98 mmol/L (ref 98–110)
GFR, Est African American: >89 mL/min (ref >=60)
GFR, Est Non African American: >89 mL/min (ref >=60)
GLUCOSE: 89 mg/dL (ref 65–99)
Potassium: 3.6 mmol/L (ref 3.5–5.3)
SODIUM: 135 mmol/L (ref 135–146)
TOTAL PROTEIN: 6.7 g/dL (ref 6.1–8.1)

## 2015-03-31 LAB — POCT URINALYSIS DIP (DEVICE)
Glucose, UA: NEGATIVE mg/dL
Hgb urine dipstick: NEGATIVE
Ketones, ur: NEGATIVE mg/dL
LEUKOCYTES UA: NEGATIVE
NITRITE: NEGATIVE
PH: 5.5 (ref 5.0–8.0)
Protein, ur: 100 mg/dL — AB
Specific Gravity, Urine: 1.02 (ref 1.005–1.030)
UROBILINOGEN UA: 2 mg/dL — AB (ref 0.0–1.0)

## 2015-03-31 LAB — LACTATE DEHYDROGENASE: LDH: 246 U/L (ref 94–250)

## 2015-03-31 MED ORDER — ALBUTEROL SULFATE HFA 108 (90 BASE) MCG/ACT IN AERS: 2.0000 | INHALATION_SPRAY | Freq: Four times a day (QID) | RESPIRATORY_TRACT | Status: DC | PRN

## 2015-03-31 MED ORDER — AZITHROMYCIN 250 MG PO TABS: ORAL_TABLET | ORAL | Status: DC

## 2015-03-31 NOTE — Patient Instructions (Addendum)
Upper Respiratory Infection, Adult Most upper respiratory infections (URIs) are a viral infection of the air passages leading to the lungs. A URI affects the nose, throat, and upper air passages. The most common type of URI is nasopharyngitis and is typically referred to as "the common cold." URIs run their course and usually go away on their own. Most of the time, a URI does not require medical attention, but sometimes a bacterial infection in the upper airways can follow a viral infection. This is called a secondary infection. Sinus and middle ear infections are common types of secondary upper respiratory infections. Bacterial pneumonia can also complicate a URI. A URI can worsen asthma and chronic obstructive pulmonary disease (COPD). Sometimes, these complications can require emergency medical care and may be life threatening.  CAUSES Almost all URIs are caused by viruses. A virus is a type of germ and can spread from one person to another.  RISKS FACTORS You may be at risk for a URI if:   You smoke.   You have chronic heart or lung disease.  You have a weakened defense (immune) system.   You are very young or very old.   You have nasal allergies or asthma.  You work in crowded or poorly ventilated areas.  You work in health care facilities or schools. SIGNS AND SYMPTOMS  Symptoms typically develop 2-3 days after you come in contact with a cold virus. Most viral URIs last 7-10 days. However, viral URIs from the influenza virus (flu virus) can last 14-18 days and are typically more severe. Symptoms may include:   Runny or stuffy (congested) nose.   Sneezing.   Cough.   Sore throat.   Headache.   Fatigue.   Fever.   Loss of appetite.   Pain in your forehead, behind your eyes, and over your cheekbones (sinus pain).  Muscle aches.  DIAGNOSIS  Your health care provider may diagnose a URI by:  Physical exam.  Tests to check that your symptoms are not due to  another condition such as:  Strep throat.  Sinusitis.  Pneumonia.  Asthma. TREATMENT  A URI goes away on its own with time. It cannot be cured with medicines, but medicines may be prescribed or recommended to relieve symptoms. Medicines may help:  Reduce your fever.  Reduce your cough.  Relieve nasal congestion. HOME CARE INSTRUCTIONS   Take medicines only as directed by your health care provider.   Gargle warm saltwater or take cough drops to comfort your throat as directed by your health care provider.  Use a warm mist humidifier or inhale steam from a shower to increase air moisture. This may make it easier to breathe.  Drink enough fluid to keep your urine clear or pale yellow.   Eat soups and other clear broths and maintain good nutrition.   Rest as needed.   Return to work when your temperature has returned to normal or as your health care provider advises. You may need to stay home longer to avoid infecting others. You can also use a face mask and careful hand washing to prevent spread of the virus.  Increase the usage of your inhaler if you have asthma.   Do not use any tobacco products, including cigarettes, chewing tobacco, or electronic cigarettes. If you need help quitting, ask your health care provider. PREVENTION  The best way to protect yourself from getting a cold is to practice good hygiene.   Avoid oral or hand contact with people with cold   symptoms.   Wash your hands often if contact occurs.  There is no clear evidence that vitamin C, vitamin E, echinacea, or exercise reduces the chance of developing a cold. However, it is always recommended to get plenty of rest, exercise, and practice good nutrition.  SEEK MEDICAL CARE IF:   You are getting worse rather than better.   Your symptoms are not controlled by medicine.   You have chills.  You have worsening shortness of breath.  You have brown or red mucus.  You have yellow or brown nasal  discharge.  You have pain in your face, especially when you bend forward.  You have a fever.  You have swollen neck glands.  You have pain while swallowing.  You have white areas in the back of your throat. SEEK IMMEDIATE MEDICAL CARE IF:   You have severe or persistent:  Headache.  Ear pain.  Sinus pain.  Chest pain.  You have chronic lung disease and any of the following:  Wheezing.  Prolonged cough.  Coughing up blood.  A change in your usual mucus.  You have a stiff neck.  You have changes in your:  Vision.  Hearing.  Thinking.  Mood. MAKE SURE YOU:   Understand these instructions.  Will watch your condition.  Will get help right away if you are not doing well or get worse.   This information is not intended to replace advice given to you by your health care provider. Make sure you discuss any questions you have with your health care provider.   Document Released: 10/23/2000 Document Revised: 09/13/2014 Document Reviewed: 08/04/2013 Elsevier Interactive Patient Education 2016 [employer] Sickle Cell Anemia, Adult Sickle cell anemia is a condition in which red blood cells have an abnormal "sickle" shape. This abnormal shape shortens the cells' life span, which results in a lower than normal concentration of red blood cells in the blood. The sickle shape also causes the cells to clump together and block free blood flow through the blood vessels. As a result, the tissues and organs of the body do not receive enough oxygen. Sickle cell anemia causes organ damage and pain and increases the risk of infection. CAUSES  Sickle cell anemia is a genetic disorder. Those who receive two copies of the gene have the condition, and those who receive one copy have the trait. RISK FACTORS The sickle cell gene is most common in people whose families originated in [place]. Other areas of the globe where sickle cell trait occurs include the [place], [place] and  [place], the [place], and the [place].  SIGNS AND SYMPTOMS  Pain, especially in the extremities, back, chest, or abdomen (common). The pain may start suddenly or may develop following an illness, especially if there is dehydration. Pain can also occur due to overexertion or exposure to extreme temperature changes.  Frequent severe bacterial infections, especially certain types of pneumonia and meningitis.  Pain and swelling in the hands and feet.  Decreased activity.   Loss of appetite.   Change in behavior.  Headaches.  Seizures.  Shortness of breath or difficulty breathing.  Vision changes.  Skin ulcers. Those with the trait may not have symptoms or they may have mild symptoms.  DIAGNOSIS  Sickle cell anemia is diagnosed with blood tests that demonstrate the genetic trait. It is often diagnosed during the newborn period, due to mandatory testing nationwide. A variety of blood tests, X-rays, CT scans, MRI scans, ultrasounds, and lung function tests may also be   done to monitor the condition. TREATMENT  Sickle cell anemia may be treated with:  Medicines. You may be given pain medicines, antibiotic medicines (to treat and prevent infections) or medicines to increase the production of certain types of hemoglobin.  Fluids.  Oxygen.  Blood transfusions. HOME CARE INSTRUCTIONS   Drink enough fluid to keep your urine clear or pale yellow. Increase your fluid intake in hot weather and during exercise.  Do not smoke. Smoking lowers oxygen levels in the blood.   Only take over-the-counter or prescription medicines for pain, fever, or discomfort as directed by your health care provider.  Take antibiotics as directed by your health care provider. Make sure you finish them it even if you start to feel better.   Take supplements as directed by your health care provider.   Consider wearing a medical alert bracelet. This tells anyone caring for you in an emergency  of your condition.   When traveling, keep your medical information, health care provider's names, and the medicines you take with you at all times.   If you develop a fever, do not take medicines to reduce the fever right away. This could cover up a problem that is developing. Notify your health care provider.  Keep all follow-up appointments with your health care provider. Sickle cell anemia requires regular medical care. SEEK MEDICAL CARE IF: You have a fever. SEEK IMMEDIATE MEDICAL CARE IF:   You feel dizzy or faint.   You have new abdominal pain, especially on the left side near the stomach area.   You develop a persistent, often uncomfortable and painful penile erection (priapism). If this is not treated immediately it will lead to impotence.   You have numbness your arms or legs or you have a hard time moving them.   You have a hard time with speech.   You have a fever or persistent symptoms for more than 2-3 days.   You have a fever and your symptoms suddenly get worse.   You have signs or symptoms of infection. These include:   Chills.   Abnormal tiredness (lethargy).   Irritability.   Poor eating.   Vomiting.   You develop pain that is not helped with medicine.   You develop shortness of breath.  You have pain in your chest.   You are coughing up pus-like or bloody sputum.   You develop a stiff neck.  Your feet or hands swell or have pain.  Your abdomen appears bloated.  You develop joint pain. MAKE SURE YOU:  Understand these instructions.   This information is not intended to replace advice given to you by your health care provider. Make sure you discuss any questions you have with your health care provider.   Document Released: 08/07/2005 Document Revised: 05/20/2014 Document Reviewed: 12/09/2012 Elsevier Interactive Patient Education 2016 [employer]  

## 2015-03-31 NOTE — Progress Notes (Signed)
Subjective:    Patient ID: Cassie Montgomery, female    DOB: [birth year], 44 y.o.   MRN: [id number]  HPI  Ms. Syona Craige, a 44 year old patient with a history of sickle cell anemia, HbSC presents for a 3 month follow up of sickle cell anemia. Ms. Cunning is currently followed by [facility] for chronic pain. She maintains that she is taking folic acid consistently. Patient was previously trialed on hydroxyurea. She could not tolerate hydrea due to increased abdominal pain and nausea. She maintains that current pain intensity is 4/10 on current medication regimen. Pain is primarily to lower back and extremities.  She is followed by pain management monthly.    Ms. Otis is also complaining of fatigue, nasal congestion, sinus pressure and a productive cough over the past 2 weeks. She states that she has been taking OTC pain medication with minimal relief. She says that she has increased her fluid intake over the past week.  Ms. Taing denies chest pains, shortness of breath, constipation, nausea, vomiting, or diarrhea.   Immunization History  Administered Date(s) Administered  . Influenza,inj,Quad PF,36+ Mos 02/15/2013, 01/19/2014, 06/13/2014  . Pneumococcal Polysaccharide-23 06/11/2013, 06/13/2014  . Tdap 06/11/2013   Past Medical History  Diagnosis Date  . PMDD (premenstrual dysphoric disorder)   . Sickle cell anemia (HCC)   . Sickle cell disease without crisis ([place]) 07/15/2011  . Migraine with aura 07/15/2011  . GERD (gastroesophageal reflux disease)   . Asthma in adult     probable  . Depression   . Chronic pain syndrome   . Left arm pain     chronic  . Anxiety   . Ganglion cyst    Social History   Social History  . Marital Status: Single    Spouse Name: N/A  . Number of Children: N/A  . Years of Education: N/A   Occupational History  . Not on file.   Social History Main Topics  . Smoking status: Never Smoker   . Smokeless tobacco: Not on file  . Alcohol  Use: No  . Drug Use: No  . Sexual Activity: Yes   Other Topics Concern  . Not on file   Social History Narrative   Uses a cane occasionally.  Does not work.     Immunization History  Administered Date(s) Administered  . Influenza,inj,Quad PF,36+ Mos 02/15/2013, 01/19/2014, 06/13/2014  . Pneumococcal Polysaccharide-23 06/11/2013, 06/13/2014  . Tdap 06/11/2013   Review of Systems  Constitutional: Positive for fatigue.  HENT: Positive for postnasal drip, sinus pressure and sneezing. Negative for sore throat.   Eyes: Negative.  Negative for photophobia and visual disturbance.  Respiratory: Positive for cough. Negative for chest tightness and shortness of breath.   Cardiovascular: Negative.   Gastrointestinal: Negative.   Endocrine: Negative.  Negative for polydipsia, polyphagia and polyuria.  Genitourinary: Negative.  Negative for urgency.  Musculoskeletal: Positive for myalgias and back pain.  Skin: Negative.   Allergic/Immunologic: Negative.   Neurological: Positive for headaches. Negative for dizziness, tremors and speech difficulty.  Psychiatric/Behavioral: Negative.  The patient is not nervous/anxious.        Objective:   Physical Exam  Constitutional: She is oriented to person, place, and time. She appears well-developed and well-nourished. She has a sickly appearance.  HENT:  Head: Normocephalic and atraumatic.  Right Ear: Hearing, tympanic membrane, external ear and ear canal normal.  Left Ear: Hearing, tympanic membrane, external ear and ear canal normal.  Nose: No mucosal edema or sinus  tenderness. Right sinus exhibits maxillary sinus tenderness. Left sinus exhibits maxillary sinus tenderness.  Mouth/Throat: Oropharyngeal exudate (clear) present.  Eyes: Conjunctivae, EOM and lids are normal. Pupils are equal, round, and reactive to light.  Fundoscopic exam:      The right eye shows no exudate. The right eye shows red reflex.       The left eye shows no exudate. The  left eye shows red reflex.  Neck: Normal range of motion. Neck supple.  Cardiovascular: Normal rate, regular rhythm, normal heart sounds and intact distal pulses.   Pulmonary/Chest: Effort normal and breath sounds normal.  Abdominal: Soft. Bowel sounds are normal.  Neurological: She is alert and oriented to person, place, and time. She has normal strength and normal reflexes. She displays no tremor. No cranial nerve deficit or sensory deficit. She exhibits normal muscle tone. She displays a negative Romberg sign. Coordination and gait normal.  Skin: Skin is warm and dry.  Psychiatric: She has a normal mood and affect. Her behavior is normal. Judgment and thought content normal.         BP 132/82 mmHg  Pulse 108  Temp(Src) 98.1 F (36.7 C) (Oral)  Resp 16  Ht 5\' 6"  (1.676 m)  Wt 235 lb (106.595 kg)  BMI 37.95 kg/m2  LMP 02/25/2015 Assessment & Plan:  1. Sickle cell anemia with pain ([place]) Ms. Phelan continues folic acid 1 mg daily to prevent aplastic bone marrow crises. Recommend increasing water intake to 64 ounces daily. Patient is to follow up with pain management as scheduled.  - POCT urinalysis dipstick - COMPLETE METABOLIC PANEL WITH GFR - Reticulocytes - Lactate Dehydrogenase - CBC with Differential - Hemoglobinopathy evaluation  2. Acute upper respiratory infection Recommend that Ms. Deshaies increase rest, fluid intake and handwashing.  - azithromycin (ZITHROMAX) 250 MG tablet; Take 500 mg today; Start 250 mg days 2-5  Dispense: 6 tablet; Refill: 0  3. History of asthma Patient denies a asthma exacerbation. She states that she does not have an up to date inhaler, will prescribe.  - albuterol (PROVENTIL HFA;VENTOLIN HFA) 108 (90 BASE) MCG/ACT inhaler; Inhale 2 puffs into the lungs every 6 (six) hours as needed for wheezing or shortness of breath (wheezing).  Dispense: 1 Inhaler; Refill: 0  RTC: Follow up in 3 months for sickle cell anemia  The patient was given clear  instructions to go to ER or return to medical center if symptoms do not improve, worsen or new problems develop. The patient verbalized understanding. Will notify patient with laboratory results.  Dorena Dew, FNP

## 2015-04-01 LAB — CBC WITH DIFFERENTIAL/PLATELET
BASOS PCT: 0 % (ref 0–1)
Basophils Absolute: 0 10*3/uL (ref 0.0–0.1)
Eosinophils Absolute: 0.3 10*3/uL (ref 0.0–0.7)
Eosinophils Relative: 4 % (ref 0–5)
HCT: 27.6 % — ABNORMAL LOW (ref 36.0–46.0)
HEMOGLOBIN: 9 g/dL — AB (ref 12.0–15.0)
LYMPHS ABS: 2 10*3/uL (ref 0.7–4.0)
LYMPHS PCT: 26 % (ref 12–46)
MCH: 26 pg (ref 26.0–34.0)
MCHC: 32.6 g/dL (ref 30.0–36.0)
MCV: 79.8 fL (ref 78.0–100.0)
MONOS PCT: 8 % (ref 3–12)
Monocytes Absolute: 0.6 10*3/uL (ref 0.1–1.0)
NEUTROS ABS: 4.8 10*3/uL (ref 1.7–7.7)
NEUTROS PCT: 62 % (ref 43–77)
Platelets: 131 10*3/uL — ABNORMAL LOW (ref 150–400)
RBC: 3.46 MIL/uL — ABNORMAL LOW (ref 3.87–5.11)
RDW: 19.1 % — ABNORMAL HIGH (ref 11.5–15.5)
WBC: 7.8 10*3/uL (ref 4.0–10.5)

## 2015-04-01 LAB — RETICULOCYTES
ABS Retic: 290.6 10*3/uL — ABNORMAL HIGH (ref 19.0–186.0)
RBC.: 3.46 MIL/uL — ABNORMAL LOW (ref 3.87–5.11)
Retic Ct Pct: 8.4 % — ABNORMAL HIGH (ref 0.4–2.3)

## 2015-04-03 ENCOUNTER — Encounter: Payer: Self-pay | Admitting: Family Medicine

## 2015-04-03 LAB — HEMOGLOBINOPATHY EVALUATION
HEMOGLOBIN OTHER: 38.1 % — AB
HGB A: 0 % — AB (ref 96.8–97.8)
HGB S QUANTITAION: 48.9 % — AB
Hgb A2 Quant: 3.7 % — ABNORMAL HIGH (ref 2.2–3.2)
Hgb F Quant: 9.3 % — ABNORMAL HIGH (ref 0.0–2.0)

## 2015-04-08 LAB — HGB ELECTROPHORESIS REFLEXED REPORT
HEMOGLOBIN A2 - HGBRFX: 3.9 % — AB (ref 1.8–3.5)
Hemoglobin A - HGBRFX: 0 % — ABNORMAL LOW (ref >96.0)
Hemoglobin Elect C: 39.9 % — ABNORMAL HIGH
Hemoglobin F - HGBRFX: 7.2 % — ABNORMAL HIGH (ref <2.0)
Hemoglobin S - HGBRFX: 49 % — ABNORMAL HIGH
SICKLE SOLUBILITY TEST - HGBRFX: POSITIVE — AB

## 2015-04-10 ENCOUNTER — Telehealth: Payer: Self-pay | Admitting: *Deleted

## 2015-04-10 NOTE — Telephone Encounter (Signed)
Pt concerned about pain. Claims she is taking meds as prescribed with 15mg  tabs. Feels they are not working for 4 days now. Also experiencing increased tiredness and no pain relief. Asks for callback.

## 2015-04-11 ENCOUNTER — Ambulatory Visit: Payer: Medicare Other | Admitting: Family Medicine

## 2015-04-17 ENCOUNTER — Other Ambulatory Visit: Payer: Self-pay | Admitting: Family Medicine

## 2015-04-17 DIAGNOSIS — Z8709 Personal history of other diseases of the respiratory system: Secondary | ICD-10-CM

## 2015-04-17 MED ORDER — ALBUTEROL SULFATE HFA 108 (90 BASE) MCG/ACT IN AERS: 2.0000 | INHALATION_SPRAY | Freq: Four times a day (QID) | RESPIRATORY_TRACT | Status: DC | PRN

## 2015-04-17 NOTE — Telephone Encounter (Signed)
Refill for albuterol sent into pharmacy. Thanks!  

## 2015-04-18 ENCOUNTER — Telehealth: Payer: Self-pay | Admitting: Clinical

## 2015-04-18 NOTE — Telephone Encounter (Signed)
Follow-up with Cassie Montgomery, she will find out tomorrow the outcome of housing inspection. She says she needs help with housing deposit, has already contacted [employer] and [employer], but no deposit help is available at this time. Pt low on food resources, receives inadequate ebt amount monthly, currently has vehicle for transportation, uncertain when next [facility] group meeting will be held. Cassie Montgomery states that she plans to attend group [facility] meeting on 04-26-15 from12-1; will accept community resource listing of free meals in [place], and agencies that provide utility assistance, and is aware that she may be unable to find resource to pay for rent deposit on new apartments.

## 2015-04-24 DIAGNOSIS — M545 Low back pain: Secondary | ICD-10-CM | POA: Diagnosis not present

## 2015-04-24 DIAGNOSIS — G8929 Other chronic pain: Secondary | ICD-10-CM | POA: Diagnosis not present

## 2015-05-09 ENCOUNTER — Emergency Department (HOSPITAL_COMMUNITY)
Admission: EM | Admit: 2015-05-09 | Discharge: 2015-05-09 | Discharge status: Against Medical Advice | Payer: Medicare Other | Attending: Emergency Medicine | Admitting: Emergency Medicine

## 2015-05-09 ENCOUNTER — Encounter (HOSPITAL_COMMUNITY): Payer: Self-pay | Admitting: Emergency Medicine

## 2015-05-09 DIAGNOSIS — G894 Chronic pain syndrome: Secondary | ICD-10-CM | POA: Insufficient documentation

## 2015-05-09 DIAGNOSIS — D5700 Hb-SS disease with crisis, unspecified: Secondary | ICD-10-CM | POA: Insufficient documentation

## 2015-05-09 DIAGNOSIS — J45909 Unspecified asthma, uncomplicated: Secondary | ICD-10-CM | POA: Insufficient documentation

## 2015-05-09 NOTE — ED Notes (Signed)
Patient presents for SS pain x3 days. Reports pain in legs, hands, back and shoulders. Denies other c/c. Rates pain 7/10. 15mg  oxycodone, 600mg  motrin approximately 2000.

## 2015-05-24 DIAGNOSIS — G894 Chronic pain syndrome: Secondary | ICD-10-CM | POA: Diagnosis not present

## 2015-05-24 DIAGNOSIS — Z79891 Long term (current) use of opiate analgesic: Secondary | ICD-10-CM | POA: Diagnosis not present

## 2015-05-24 DIAGNOSIS — M25562 Pain in left knee: Secondary | ICD-10-CM | POA: Diagnosis not present

## 2015-05-24 DIAGNOSIS — M25561 Pain in right knee: Secondary | ICD-10-CM | POA: Diagnosis not present

## 2015-05-24 DIAGNOSIS — R1030 Lower abdominal pain, unspecified: Secondary | ICD-10-CM | POA: Diagnosis not present

## 2015-05-24 DIAGNOSIS — M25511 Pain in right shoulder: Secondary | ICD-10-CM | POA: Diagnosis not present

## 2015-06-20 ENCOUNTER — Ambulatory Visit: Payer: Medicare Other | Admitting: Internal Medicine

## 2015-06-27 ENCOUNTER — Encounter (HOSPITAL_COMMUNITY): Payer: Self-pay

## 2015-06-27 ENCOUNTER — Emergency Department (HOSPITAL_COMMUNITY)
Admission: EM | Admit: 2015-06-27 | Discharge: 2015-06-27 | Disposition: A | Discharge status: Home / Self Care | Payer: Medicare Other | Attending: Emergency Medicine | Admitting: Emergency Medicine

## 2015-06-27 DIAGNOSIS — G894 Chronic pain syndrome: Secondary | ICD-10-CM | POA: Diagnosis not present

## 2015-06-27 DIAGNOSIS — D5700 Hb-SS disease with crisis, unspecified: Secondary | ICD-10-CM | POA: Insufficient documentation

## 2015-06-27 DIAGNOSIS — J45909 Unspecified asthma, uncomplicated: Secondary | ICD-10-CM | POA: Insufficient documentation

## 2015-06-27 LAB — CBC WITH DIFFERENTIAL/PLATELET
BASOS ABS: 0 10*3/uL (ref 0.0–0.1)
Basophils Relative: 0 %
Eosinophils Absolute: 0.2 10*3/uL (ref 0.0–0.7)
Eosinophils Relative: 2 %
HEMATOCRIT: 34.1 % — AB (ref 36.0–46.0)
HEMOGLOBIN: 11.7 g/dL — AB (ref 12.0–15.0)
LYMPHS PCT: 18 %
Lymphs Abs: 1.8 10*3/uL (ref 0.7–4.0)
MCH: 25.2 pg — ABNORMAL LOW (ref 26.0–34.0)
MCHC: 34.3 g/dL (ref 30.0–36.0)
MCV: 73.3 fL — AB (ref 78.0–100.0)
MONO ABS: 0.5 10*3/uL (ref 0.1–1.0)
Monocytes Relative: 5 %
NEUTROS ABS: 7.4 10*3/uL (ref 1.7–7.7)
NEUTROS PCT: 75 %
PLATELETS: 131 10*3/uL — AB (ref 150–400)
RBC: 4.65 MIL/uL (ref 3.87–5.11)
RDW: 17 % — AB (ref 11.5–15.5)
WBC: 10 10*3/uL (ref 4.0–10.5)

## 2015-06-27 LAB — COMPREHENSIVE METABOLIC PANEL
ALBUMIN: 4.6 g/dL (ref 3.5–5.0)
ALT: 10 U/L — ABNORMAL LOW (ref 14–54)
ANION GAP: 10 (ref 5–15)
AST: 13 U/L — ABNORMAL LOW (ref 15–41)
Alkaline Phosphatase: 61 U/L (ref 38–126)
BILIRUBIN TOTAL: 2.5 mg/dL — AB (ref 0.3–1.2)
BUN: 15 mg/dL (ref 6–20)
CO2: 22 mmol/L (ref 22–32)
Calcium: 9 mg/dL (ref 8.9–10.3)
Chloride: 110 mmol/L (ref 101–111)
Creatinine, Ser: 0.76 mg/dL (ref 0.44–1.00)
GFR calc Af Amer: >60 mL/min (ref >60)
GFR calc non Af Amer: >60 mL/min (ref >60)
GLUCOSE: 113 mg/dL — AB (ref 65–99)
POTASSIUM: 3.7 mmol/L (ref 3.5–5.1)
SODIUM: 142 mmol/L (ref 135–145)
TOTAL PROTEIN: 7.6 g/dL (ref 6.5–8.1)

## 2015-06-27 LAB — RETICULOCYTES
RBC.: 4.65 MIL/uL (ref 3.87–5.11)
RETIC COUNT ABSOLUTE: 153.5 10*3/uL (ref 19.0–186.0)
RETIC CT PCT: 3.3 % — AB (ref 0.4–3.1)

## 2015-06-27 NOTE — ED Notes (Signed)
Called in waiting room x 2.

## 2015-06-27 NOTE — ED Notes (Signed)
Called in waiting room x1

## 2015-06-27 NOTE — ED Notes (Signed)
Attempted to call Pt x 3 w/o response.  It is assumed that the Pt left.

## 2015-06-27 NOTE — ED Notes (Signed)
Pt c/o L shoulder, L hip and L leg pain r/t Sickle Cell Crisis x 1 day. Pain score 7/10.  Pt reports taking all medications as prescribed.  Denies SOB, chest pain, and back pain.  Denies n/v/d.

## 2015-06-29 IMAGING — CR DG ABDOMEN ACUTE W/ 1V CHEST
4 series · 4 of 4 positions shown · non-contrast
Comparison: Chest radiograph June 11, 2014 ; CT abdomen and
pelvis April 19, 2012

CLINICAL DATA: Abdominal pain with nausea, vomiting, and diarrhea
for 1 day. Chest pain for 1 day. Sickle cell disease.

EXAM:
ACUTE ABDOMEN SERIES (ABDOMEN 2 VIEW & CHEST 1 VIEW)

[w chest pa]
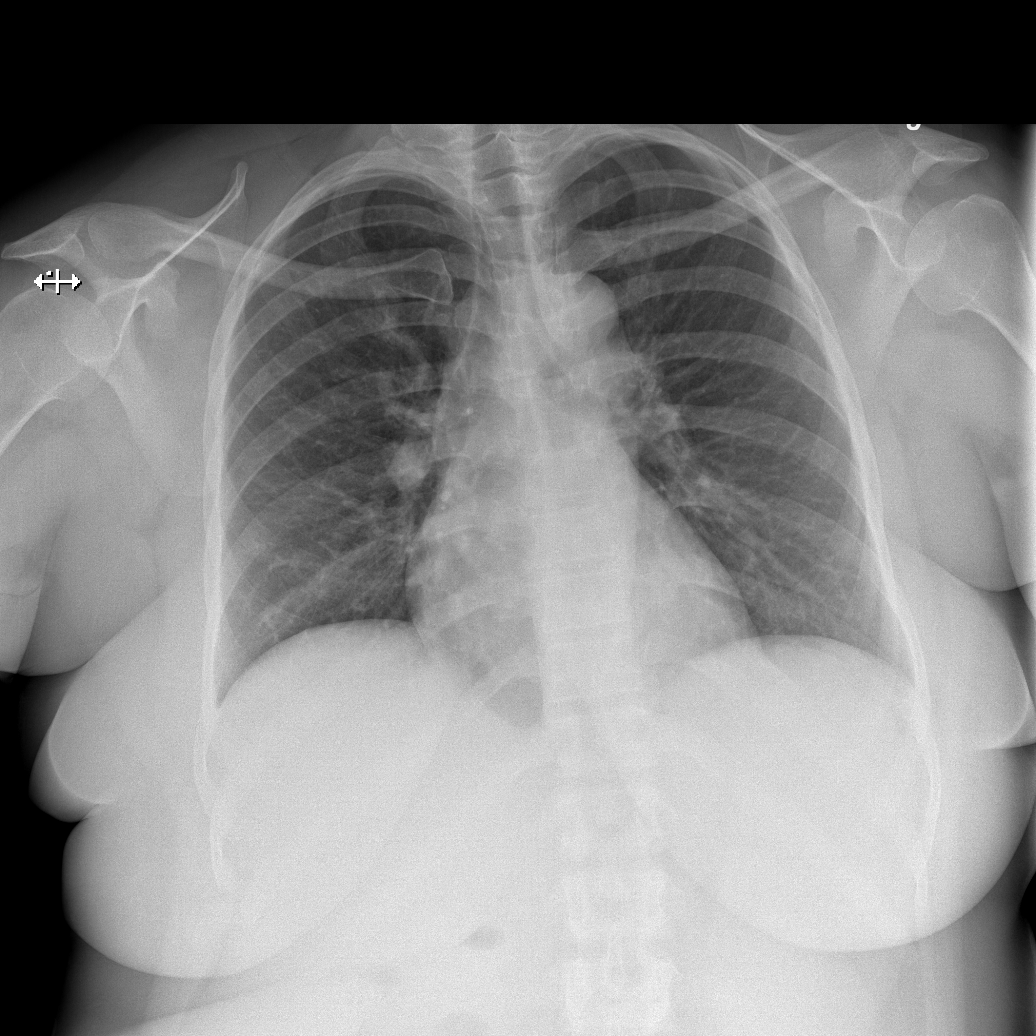

[w abdomen upright]
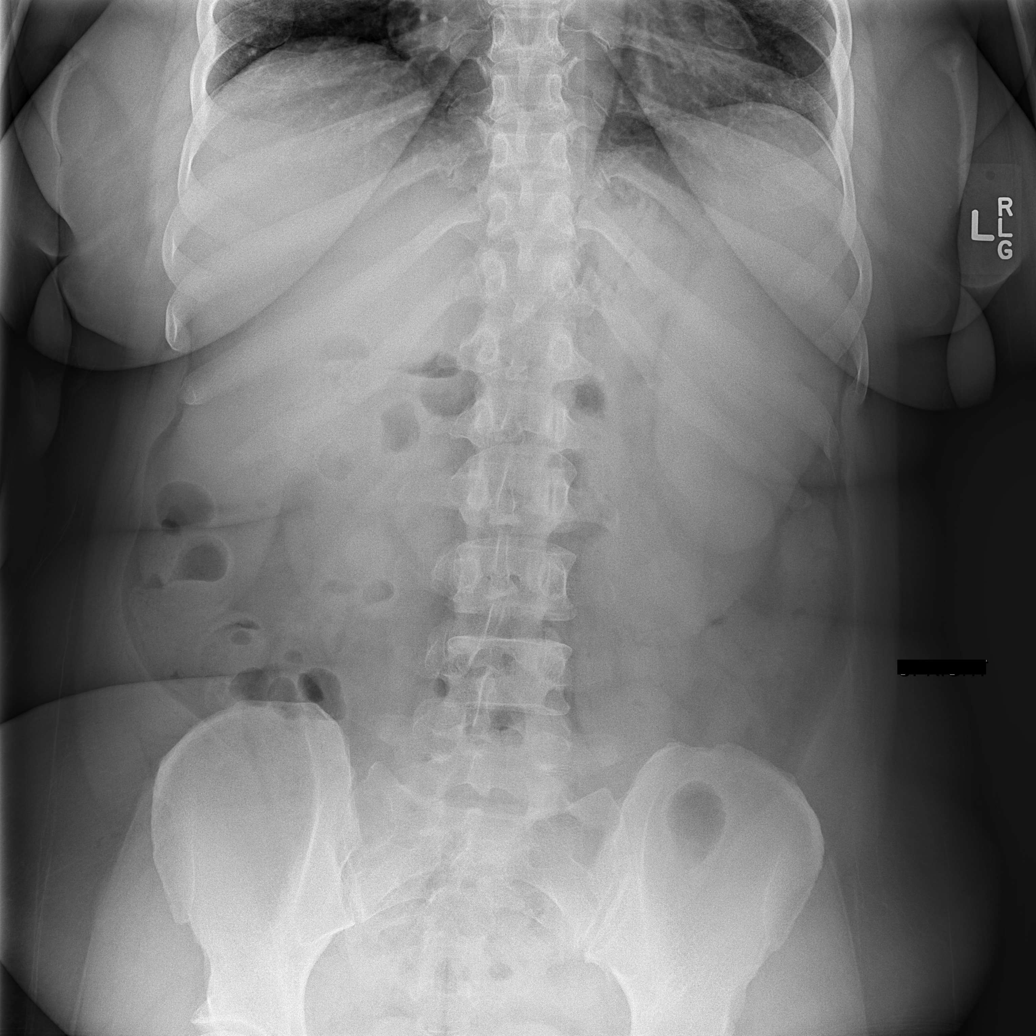

[t abdomen supine (1 of 2)]
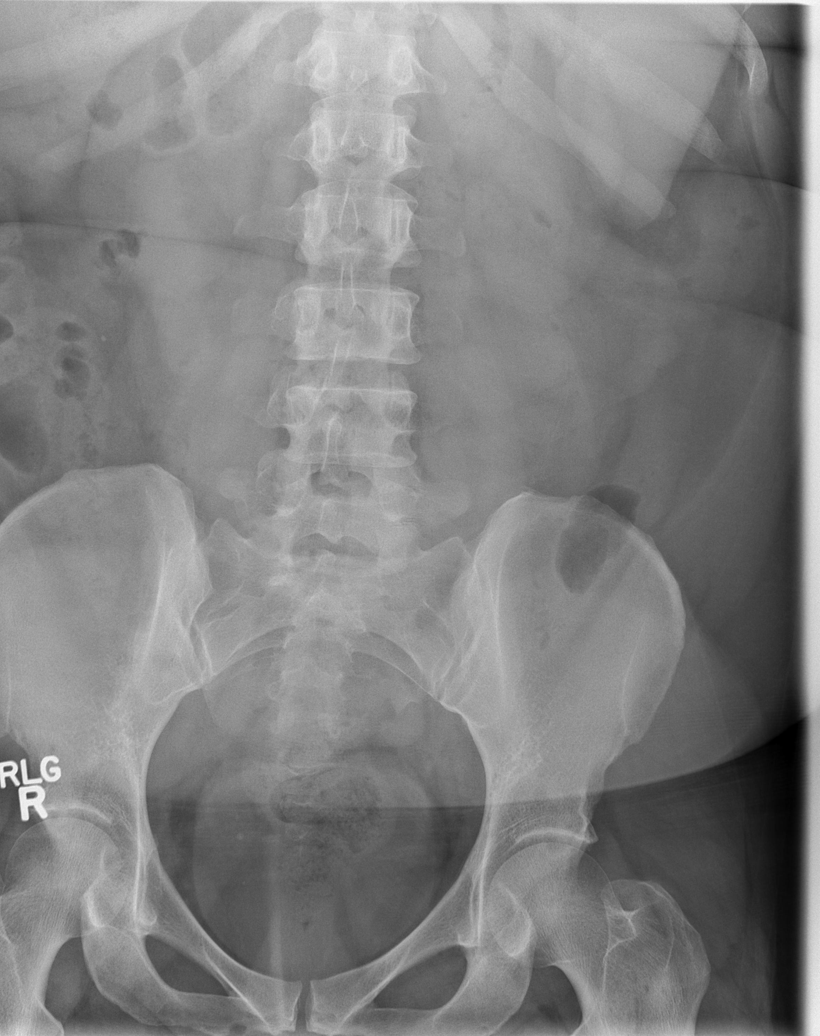

[t abdomen supine (2 of 2)]
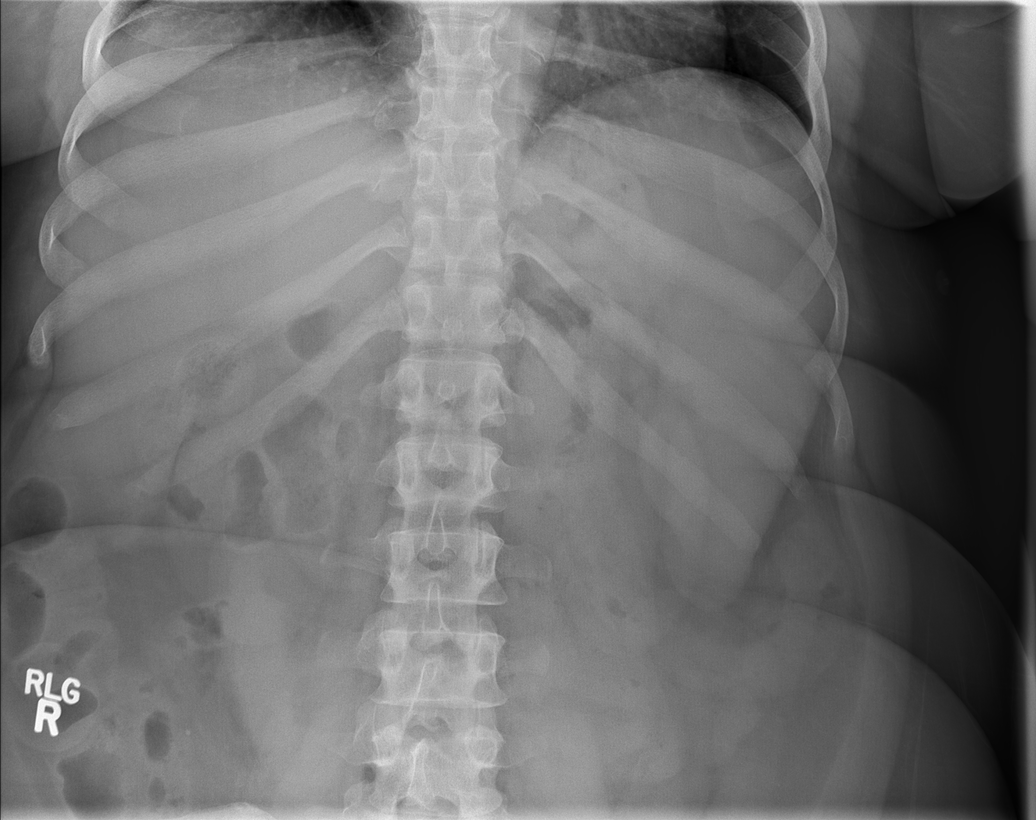

[4 of 4 positions shown; findings below may reference images not displayed]

FINDINGS: PA chest: No edema or consolidation. Heart size and pulmonary
vascularity are normal. No adenopathy.

Supine and upright abdomen: There is moderate stool throughout the
colon. There is no bowel dilatation or air-fluid level suggesting
obstruction. No free air. Spleen is enlarged. No bone lesions
appreciable.
IMPRESSION: Enlarged spleen.  Bowel gas pattern unremarkable.  Lungs clear.

## 2015-09-05 ENCOUNTER — Encounter: Payer: Self-pay | Admitting: Family Medicine

## 2015-09-05 ENCOUNTER — Ambulatory Visit (HOSPITAL_COMMUNITY)
Admission: RE | Admit: 2015-09-05 | Discharge: 2015-09-05 | Disposition: A | Discharge status: Home / Self Care | Payer: Medicare Other | Source: Ambulatory Visit | Attending: Family Medicine | Admitting: Family Medicine

## 2015-09-05 ENCOUNTER — Ambulatory Visit (INDEPENDENT_AMBULATORY_CARE_PROVIDER_SITE_OTHER): Payer: Medicare Other | Admitting: Family Medicine

## 2015-09-05 VITALS — BP 126/82 | HR 90 | Temp 98.4°F | Resp 16 | Ht 66.0 in | Wt 220.0 lb

## 2015-09-05 DIAGNOSIS — D5700 Hb-SS disease with crisis, unspecified: Secondary | ICD-10-CM

## 2015-09-05 DIAGNOSIS — M25512 Pain in left shoulder: Secondary | ICD-10-CM

## 2015-09-05 DIAGNOSIS — M19012 Primary osteoarthritis, left shoulder: Secondary | ICD-10-CM | POA: Insufficient documentation

## 2015-09-05 DIAGNOSIS — G8929 Other chronic pain: Secondary | ICD-10-CM

## 2015-09-05 LAB — COMPLETE METABOLIC PANEL WITH GFR
ALBUMIN: 4.8 g/dL (ref 3.6–5.1)
ALK PHOS: 62 U/L (ref 33–115)
ALT: 9 U/L (ref 6–29)
AST: 10 U/L (ref 10–35)
BILIRUBIN TOTAL: 2.8 mg/dL — AB (ref 0.2–1.2)
BUN: 11 mg/dL (ref 7–25)
CALCIUM: 9.7 mg/dL (ref 8.6–10.2)
CO2: 24 mmol/L (ref 20–31)
Chloride: 110 mmol/L (ref 98–110)
Creat: 0.72 mg/dL (ref 0.50–1.10)
Glucose, Bld: 97 mg/dL (ref 65–99)
Potassium: 3.7 mmol/L (ref 3.5–5.3)
Sodium: 140 mmol/L (ref 135–146)
TOTAL PROTEIN: 7.4 g/dL (ref 6.1–8.1)

## 2015-09-05 LAB — CBC WITH DIFFERENTIAL/PLATELET
BASOS ABS: 0 {cells}/uL (ref 0–200)
Basophils Relative: 0 %
EOS ABS: 91 {cells}/uL (ref 15–500)
Eosinophils Relative: 1 %
HCT: 32.4 % — ABNORMAL LOW (ref 35.0–45.0)
Hemoglobin: 10.6 g/dL — ABNORMAL LOW (ref 11.7–15.5)
LYMPHS ABS: 1911 {cells}/uL (ref 850–3900)
Lymphocytes Relative: 21 %
MCH: 25.7 pg — AB (ref 27.0–33.0)
MCHC: 32.7 g/dL (ref 32.0–36.0)
MCV: 78.5 fL — AB (ref 80.0–100.0)
MONOS PCT: 4 %
Monocytes Absolute: 364 cells/uL (ref 200–950)
NEUTROS ABS: 6734 {cells}/uL (ref 1500–7800)
NEUTROS PCT: 74 %
Platelets: 134 10*3/uL — ABNORMAL LOW (ref 140–400)
RBC: 4.13 MIL/uL (ref 3.80–5.10)
RDW: 18.6 % — ABNORMAL HIGH (ref 11.0–15.0)
WBC: 9.1 10*3/uL (ref 3.8–10.8)

## 2015-09-05 LAB — POCT URINALYSIS DIP (DEVICE)
Bilirubin Urine: NEGATIVE
Glucose, UA: NEGATIVE mg/dL
KETONES UR: NEGATIVE mg/dL
Leukocytes, UA: NEGATIVE
NITRITE: NEGATIVE
PH: 5.5 (ref 5.0–8.0)
PROTEIN: NEGATIVE mg/dL
Specific Gravity, Urine: 1.02 (ref 1.005–1.030)
Urobilinogen, UA: 0.2 mg/dL (ref 0.0–1.0)

## 2015-09-05 LAB — RETICULOCYTES
ABS RETIC: 198240 {cells}/uL — AB (ref 20000–80000)
RBC.: 4.13 MIL/uL (ref 3.80–5.10)
RETIC CT PCT: 4.8 %

## 2015-09-05 MED ORDER — OXYCODONE HCL 10 MG PO TABS: 10.0000 mg | ORAL_TABLET | ORAL | Status: DC | PRN

## 2015-09-05 NOTE — Progress Notes (Signed)
Subjective:    Patient ID: Cassie Montgomery, female    DOB: 1970-09-15, 45 y.o.   MRN: [id number]  HPI  Cassie Montgomery, a 45 year old patient with a history of sickle cell anemia, HbSC presents for a 46 month follow up of sickle cell anemia. Cassie Montgomery was followed by [facility] for chronic pain related to sickle cell anemia. She has been lost to follow-up due to lack of childcare. She says that current pain intensity is 7/10 primarily to left shoulder. She describes pain as constant and throbbing. She says that it is difficult to lift objects or reach above head due to left shoulder pain.  She states that she has not taken opiate pain medication in greater than 1 week. She says that she is taking folic acid consistently. She denies fatigue, shortness of breath, chest pain, dysuria, nausea, vomiting, or diarrhea.  Immunization History  Administered Date(s) Administered  . Influenza,inj,Quad PF,36+ Mos 02/15/2013, 01/19/2014, 06/13/2014  . Pneumococcal Polysaccharide-23 06/11/2013, 06/13/2014  . Tdap 06/11/2013   Past Medical History  Diagnosis Date  . PMDD (premenstrual dysphoric disorder)   . Sickle cell anemia (HCC)   . Sickle cell disease without crisis ([place]) 07/15/2011  . Migraine with aura 07/15/2011  . GERD (gastroesophageal reflux disease)   . Asthma in adult     probable  . Depression   . Chronic pain syndrome   . Left arm pain     chronic  . Anxiety   . Ganglion cyst    Social History   Social History  . Marital Status: Single    Spouse Name: N/A  . Number of Children: N/A  . Years of Education: N/A   Occupational History  . Not on file.   Social History Main Topics  . Smoking status: Never Smoker   . Smokeless tobacco: Not on file  . Alcohol Use: No  . Drug Use: No  . Sexual Activity: Yes   Other Topics Concern  . Not on file   Social History Narrative   Uses a cane occasionally.  Does not work.     Immunization History  Administered  Date(s) Administered  . Influenza,inj,Quad PF,36+ Mos 02/15/2013, 01/19/2014, 06/13/2014  . Pneumococcal Polysaccharide-23 06/11/2013, 06/13/2014  . Tdap 06/11/2013   Review of Systems  Constitutional: Negative.   HENT: Negative.  Negative for sore throat.   Eyes: Negative.  Negative for photophobia and visual disturbance.  Respiratory: Negative for chest tightness and shortness of breath.   Cardiovascular: Negative.   Gastrointestinal: Negative.   Endocrine: Negative.  Negative for polydipsia, polyphagia and polyuria.  Genitourinary: Negative.  Negative for urgency.  Musculoskeletal: Myalgias: left shoulder pain.  Skin: Negative.   Allergic/Immunologic: Negative.   Neurological: Negative for dizziness, tremors and speech difficulty.  Psychiatric/Behavioral: Negative.  The patient is not nervous/anxious.        Objective:   Physical Exam  Constitutional: She is oriented to person, place, and time. She appears well-developed and well-nourished. She has a sickly appearance.  HENT:  Head: Normocephalic and atraumatic.  Right Ear: Hearing, tympanic membrane, external ear and ear canal normal.  Left Ear: Hearing, tympanic membrane, external ear and ear canal normal.  Nose: No mucosal edema or sinus tenderness. Right sinus exhibits maxillary sinus tenderness. Left sinus exhibits maxillary sinus tenderness.  Mouth/Throat: Oropharyngeal exudate: clear.  Eyes: Conjunctivae, EOM and lids are normal. Pupils are equal, round, and reactive to light.  Fundoscopic exam:      The right  eye shows no exudate. The right eye shows red reflex.       The left eye shows no exudate. The left eye shows red reflex.  Neck: Normal range of motion. Neck supple.  Cardiovascular: Normal rate, regular rhythm, normal heart sounds and intact distal pulses.   Pulmonary/Chest: Effort normal and breath sounds normal.  Abdominal: Soft. Bowel sounds are normal.  Musculoskeletal:       Left shoulder: She exhibits  decreased range of motion, tenderness, pain and decreased strength. She exhibits no spasm.  Neurological: She is alert and oriented to person, place, and time. She has normal strength and normal reflexes. She displays no tremor. No cranial nerve deficit or sensory deficit. She exhibits normal muscle tone. She displays a negative Romberg sign. Coordination and gait normal.  Skin: Skin is warm and dry.  Psychiatric: She has a normal mood and affect. Her behavior is normal. Judgment and thought content normal.       BP 126/82 mmHg  Pulse 90  Temp(Src) 98.4 F (36.9 C) (Oral)  Resp 16  Ht 5\' 6"  (1.676 m)  Wt 220 lb (99.791 kg)  BMI 35.53 kg/m2  SpO2 100%  LMP 06/21/2015 Assessment & Plan:   1. Sickle cell anemia with pain (HCC)  Will continue folic acid 1 mg daily to prevent aplastic bone marrow crises.   Pulmonary evaluation - Patient denies severe recurrent wheezes, shortness of breath with exercise, or persistent cough. If these symptoms develop, pulmonary function tests with spirometry will be ordered, and if abnormal, plan on referral to Pulmonology for further evaluation.  Cardiac - Routine screening for pulmonary hypertension is not recommended.  Eye - High risk of proliferative retinopathy. Annual eye exam with retinal exam recommended to patient. Last eye examination 6 months ago  Immunization status - Patient is up to date with vaccinations  Acute and chronic painful episodes - We agreed on Oxycodone 10 mg every 4 hours for pain management.  We discussed that pt is to receive her Schedule II prescriptions only from [place]. Pt is also aware that the prescription history is available to [place] online through the [facility] CSRS. Controlled substance agreement signed 09/05/2015. We reminded Cassie Montgomery that all patients receiving Schedule II narcotics must be seen for follow within one month of prescription being requested. We reviewed the terms of our pain agreement, including the need to keep  medicines in a safe locked location away from children or pets, and the need to report excess sedation or constipation, measures to avoid constipation, and policies related to early refills and stolen prescriptions. According to the [place] [facility] program, we have reviewed details related to analgesia, adverse effects, aberrant behaviors. Reviewed [place] Substance Reporting system prior to prescribing opiate medication, no inconsistencies noted.   - oxyCODONE 10 MG TABS; Take 1 tablet (10 mg total) by mouth every 4 (four) hours as needed for pain. Reported on 09/05/2015  Dispense: 90 tablet; Refill: 0 - DG Shoulder Left; Future - CBC with Differential - COMPLETE METABOLIC PANEL WITH GFR - Reticulocytes  2. Chronic shoulder pain, left Patient has decreased strength to left upper extremity. Physical exam was limited due to increased guarding. Will start with an xray of left shoulder. Patient is at rist for AVN, may warrant orthopedic referral for further evaluation.  - DG Shoulder Left; Future     RTC: Follow up in 1  for sickle cell anemia and medication mangement  The patient was given clear instructions to go to  ER or return to medical center if symptoms do not improve, worsen or new problems develop. The patient verbalized understanding. Will notify patient with laboratory results.  Dorena Dew, FNP

## 2015-09-05 NOTE — Patient Instructions (Signed)
Sickle Cell Anemia, Adult Sickle cell anemia is a condition in which red blood cells have an abnormal "sickle" shape. This abnormal shape shortens the cells' life span, which results in a lower than normal concentration of red blood cells in the blood. The sickle shape also causes the cells to clump together and block free blood flow through the blood vessels. As a result, the tissues and organs of the body do not receive enough oxygen. Sickle cell anemia causes organ damage and pain and increases the risk of infection. CAUSES  Sickle cell anemia is a genetic disorder. Those who receive two copies of the gene have the condition, and those who receive one copy have the trait. RISK FACTORS The sickle cell gene is most common in people whose families originated in [place]. Other areas of the globe where sickle cell trait occurs include the Mediterranean, [place] and [place], the [place], and the [place].  SIGNS AND SYMPTOMS  Pain, especially in the extremities, back, chest, or abdomen (common). The pain may start suddenly or may develop following an illness, especially if there is dehydration. Pain can also occur due to overexertion or exposure to extreme temperature changes.  Frequent severe bacterial infections, especially certain types of pneumonia and meningitis.  Pain and swelling in the hands and feet.  Decreased activity.   Loss of appetite.   Change in behavior.  Headaches.  Seizures.  Shortness of breath or difficulty breathing.  Vision changes.  Skin ulcers. Those with the trait may not have symptoms or they may have mild symptoms.  DIAGNOSIS  Sickle cell anemia is diagnosed with blood tests that demonstrate the genetic trait. It is often diagnosed during the newborn period, due to mandatory testing nationwide. A variety of blood tests, X-rays, CT scans, MRI scans, ultrasounds, and lung function tests may also be done to monitor the condition. TREATMENT  Sickle  cell anemia may be treated with:  Medicines. You may be given pain medicines, antibiotic medicines (to treat and prevent infections) or medicines to increase the production of certain types of hemoglobin.  Fluids.  Oxygen.  Blood transfusions. HOME CARE INSTRUCTIONS   Drink enough fluid to keep your urine clear or pale yellow. Increase your fluid intake in hot weather and during exercise.  Do not smoke. Smoking lowers oxygen levels in the blood.   Only take over-the-counter or prescription medicines for pain, fever, or discomfort as directed by your health care provider.  Take antibiotics as directed by your health care provider. Make sure you finish them it even if you start to feel better.   Take supplements as directed by your health care provider.   Consider wearing a medical alert bracelet. This tells anyone caring for you in an emergency of your condition.   When traveling, keep your medical information, health care provider's names, and the medicines you take with you at all times.   If you develop a fever, do not take medicines to reduce the fever right away. This could cover up a problem that is developing. Notify your health care provider.  Keep all follow-up appointments with your health care provider. Sickle cell anemia requires regular medical care. SEEK MEDICAL CARE IF: You have a fever. SEEK IMMEDIATE MEDICAL CARE IF:   You feel dizzy or faint.   You have new abdominal pain, especially on the left side near the stomach area.   You develop a persistent, often uncomfortable and painful penile erection (priapism). If this is not treated immediately it   will lead to impotence.   You have numbness your arms or legs or you have a hard time moving them.   You have a hard time with speech.   You have a fever or persistent symptoms for more than 2-3 days.   You have a fever and your symptoms suddenly get worse.   You have signs or symptoms of infection.  These include:   Chills.   Abnormal tiredness (lethargy).   Irritability.   Poor eating.   Vomiting.   You develop pain that is not helped with medicine.   You develop shortness of breath.  You have pain in your chest.   You are coughing up pus-like or bloody sputum.   You develop a stiff neck.  Your feet or hands swell or have pain.  Your abdomen appears bloated.  You develop joint pain. MAKE SURE YOU:  Understand these instructions.   This information is not intended to replace advice given to you by your health care provider. Make sure you discuss any questions you have with your health care provider.   Document Released: 08/07/2005 Document Revised: 05/20/2014 Document Reviewed: 12/09/2012 Elsevier Interactive Patient Education 2016 Elsevier Inc. Shoulder Pain The shoulder is the joint that connects your arms to your body. The bones that form the shoulder joint include the upper arm bone (humerus), the shoulder blade (scapula), and the collarbone (clavicle). The top of the humerus is shaped like a ball and fits into a rather flat socket on the scapula (glenoid cavity). A combination of muscles and strong, fibrous tissues that connect muscles to bones (tendons) support your shoulder joint and hold the ball in the socket. Small, fluid-filled sacs (bursae) are located in different areas of the joint. They act as cushions between the bones and the overlying soft tissues and help reduce friction between the gliding tendons and the bone as you move your arm. Your shoulder joint allows a wide range of motion in your arm. This range of motion allows you to do things like scratch your back or throw a ball. However, this range of motion also makes your shoulder more prone to pain from overuse and injury. Causes of shoulder pain can originate from both injury and overuse and usually can be grouped in the following four categories:  Redness, swelling, and pain (inflammation)  of the tendon (tendinitis) or the bursae (bursitis).  Instability, such as a dislocation of the joint.  Inflammation of the joint (arthritis).  Broken bone (fracture). HOME CARE INSTRUCTIONS   Apply ice to the sore area.  Put ice in a plastic bag.  Place a towel between your skin and the bag.  Leave the ice on for 15-20 minutes, 3-4 times per day for the first 2 days, or as directed by your health care provider.  Stop using cold packs if they do not help with the pain.  If you have a shoulder sling or immobilizer, wear it as long as your caregiver instructs. Only remove it to shower or bathe. Move your arm as little as possible, but keep your hand moving to prevent swelling.  Squeeze a soft ball or foam pad as much as possible to help prevent swelling.  Only take over-the-counter or prescription medicines for pain, discomfort, or fever as directed by your caregiver. SEEK MEDICAL CARE IF:   Your shoulder pain increases, or new pain develops in your arm, hand, or fingers.  Your hand or fingers become cold and numb.  Your pain is not relieved with  medicines. SEEK IMMEDIATE MEDICAL CARE IF:   Your arm, hand, or fingers are numb or tingling.  Your arm, hand, or fingers are significantly swollen or turn white or blue. MAKE SURE YOU:   Understand these instructions.  Will watch your condition.  Will get help right away if you are not doing well or get worse.   This information is not intended to replace advice given to you by your health care provider. Make sure you discuss any questions you have with your health care provider.   Document Released: 02/06/2005 Document Revised: 05/20/2014 Document Reviewed: 08/22/2014 Elsevier Interactive Patient Education [employer].

## 2015-09-06 ENCOUNTER — Other Ambulatory Visit: Payer: Self-pay | Admitting: Family Medicine

## 2015-09-06 DIAGNOSIS — G8929 Other chronic pain: Secondary | ICD-10-CM

## 2015-09-06 DIAGNOSIS — M25512 Pain in left shoulder: Principal | ICD-10-CM

## 2015-09-11 DIAGNOSIS — M19012 Primary osteoarthritis, left shoulder: Secondary | ICD-10-CM | POA: Diagnosis not present

## 2015-09-11 DIAGNOSIS — M542 Cervicalgia: Secondary | ICD-10-CM | POA: Diagnosis not present

## 2015-09-13 ENCOUNTER — Ambulatory Visit: Payer: Medicare Other | Admitting: Family Medicine

## 2015-10-03 ENCOUNTER — Telehealth: Payer: Self-pay

## 2015-10-03 DIAGNOSIS — D5700 Hb-SS disease with crisis, unspecified: Secondary | ICD-10-CM

## 2015-10-03 MED ORDER — OXYCODONE HCL 10 MG PO TABS: 10.0000 mg | ORAL_TABLET | ORAL | Status: DC | PRN

## 2015-10-03 NOTE — Telephone Encounter (Signed)
Reviewed [place] Substance Reporting system prior to prescribing opiate medications.   Meds ordered this encounter  Medications  . Oxycodone HCl 10 MG TABS    Sig: Take 1 tablet (10 mg total) by mouth every 4 (four) hours as needed. Reported on 09/05/2015    Dispense:  90 tablet    Refill:  0    Rx not to be filled prior to 10/05/2015    Order Specific Question:  Supervising Provider    Answer:  Tresa Garter [id number]   Dorena Dew, FNP

## 2015-10-03 NOTE — Telephone Encounter (Signed)
Refill request for oxycodone. LOV 09/05/2015. Please advise. Thanks!

## 2015-10-03 NOTE — Telephone Encounter (Signed)
Pt is requesting a medication refill for Oxycodone. Thanks!

## 2015-10-10 ENCOUNTER — Ambulatory Visit (INDEPENDENT_AMBULATORY_CARE_PROVIDER_SITE_OTHER): Payer: Medicare Other | Admitting: Internal Medicine

## 2015-10-10 ENCOUNTER — Encounter: Payer: Self-pay | Admitting: Internal Medicine

## 2015-10-10 VITALS — BP 116/66 | HR 84 | Temp 98.3°F | Resp 18 | Ht 66.0 in | Wt 220.0 lb

## 2015-10-10 DIAGNOSIS — R600 Localized edema: Secondary | ICD-10-CM

## 2015-10-10 DIAGNOSIS — D5700 Hb-SS disease with crisis, unspecified: Secondary | ICD-10-CM | POA: Diagnosis not present

## 2015-10-10 DIAGNOSIS — E785 Hyperlipidemia, unspecified: Secondary | ICD-10-CM | POA: Diagnosis not present

## 2015-10-10 DIAGNOSIS — R609 Edema, unspecified: Secondary | ICD-10-CM | POA: Diagnosis not present

## 2015-10-10 LAB — LIPID PANEL
Cholesterol: 99 mg/dL — ABNORMAL LOW (ref 125–200)
HDL: 32 mg/dL — ABNORMAL LOW (ref >=46)
LDL CALC: 48 mg/dL (ref <130)
Total CHOL/HDL Ratio: 3.1 Ratio (ref <=5.0)
Triglycerides: 93 mg/dL (ref <150)
VLDL: 19 mg/dL (ref <30)

## 2015-10-10 LAB — COMPLETE METABOLIC PANEL WITH GFR
ALT: 11 U/L (ref 6–29)
AST: 14 U/L (ref 10–35)
Albumin: 4.3 g/dL (ref 3.6–5.1)
Alkaline Phosphatase: 60 U/L (ref 33–115)
BUN: 7 mg/dL (ref 7–25)
CALCIUM: 8.9 mg/dL (ref 8.6–10.2)
CHLORIDE: 103 mmol/L (ref 98–110)
CO2: 25 mmol/L (ref 20–31)
CREATININE: 0.66 mg/dL (ref 0.50–1.10)
GFR, Est African American: >89 mL/min (ref >=60)
GFR, Est Non African American: >89 mL/min (ref >=60)
Glucose, Bld: 87 mg/dL (ref 65–99)
Potassium: 4 mmol/L (ref 3.5–5.3)
Sodium: 137 mmol/L (ref 135–146)
TOTAL PROTEIN: 6.7 g/dL (ref 6.1–8.1)
Total Bilirubin: 3.4 mg/dL — ABNORMAL HIGH (ref 0.2–1.2)

## 2015-10-10 LAB — CBC WITH DIFFERENTIAL/PLATELET
BASOS ABS: 0 {cells}/uL (ref 0–200)
Basophils Relative: 0 %
EOS ABS: 189 {cells}/uL (ref 15–500)
Eosinophils Relative: 3 %
HCT: 31.2 % — ABNORMAL LOW (ref 35.0–45.0)
HEMOGLOBIN: 10 g/dL — AB (ref 11.7–15.5)
LYMPHS ABS: 2079 {cells}/uL (ref 850–3900)
Lymphocytes Relative: 33 %
MCH: 25.3 pg — AB (ref 27.0–33.0)
MCHC: 32.1 g/dL (ref 32.0–36.0)
MCV: 78.8 fL — AB (ref 80.0–100.0)
MONO ABS: 441 {cells}/uL (ref 200–950)
Monocytes Relative: 7 %
NEUTROS ABS: 3591 {cells}/uL (ref 1500–7800)
Neutrophils Relative %: 57 %
Platelets: 97 10*3/uL — ABNORMAL LOW (ref 140–400)
RBC: 3.96 MIL/uL (ref 3.80–5.10)
RDW: 18.1 % — ABNORMAL HIGH (ref 11.0–15.0)
WBC: 6.3 10*3/uL (ref 3.8–10.8)

## 2015-10-10 MED ORDER — DIPHENHYDRAMINE HCL 25 MG PO TABS: 25.0000 mg | ORAL_TABLET | Freq: Every evening | ORAL | Status: AC | PRN

## 2015-10-10 NOTE — Patient Instructions (Signed)
Sickle Cell Anemia, Adult Sickle cell anemia is a condition in which red blood cells have an abnormal "sickle" shape. This abnormal shape shortens the cells' life span, which results in a lower than normal concentration of red blood cells in the blood. The sickle shape also causes the cells to clump together and block free blood flow through the blood vessels. As a result, the tissues and organs of the body do not receive enough oxygen. Sickle cell anemia causes organ damage and pain and increases the risk of infection. CAUSES  Sickle cell anemia is a genetic disorder. Those who receive two copies of the gene have the condition, and those who receive one copy have the trait. RISK FACTORS The sickle cell gene is most common in people whose families originated in [place]. Other areas of the globe where sickle cell trait occurs include the Mediterranean, South and [place], the [place], and the [place].  SIGNS AND SYMPTOMS  Pain, especially in the extremities, back, chest, or abdomen (common). The pain may start suddenly or may develop following an illness, especially if there is dehydration. Pain can also occur due to overexertion or exposure to extreme temperature changes.  Frequent severe bacterial infections, especially certain types of pneumonia and meningitis.  Pain and swelling in the hands and feet.  Decreased activity.   Loss of appetite.   Change in behavior.  Headaches.  Seizures.  Shortness of breath or difficulty breathing.  Vision changes.  Skin ulcers. Those with the trait may not have symptoms or they may have mild symptoms.  DIAGNOSIS  Sickle cell anemia is diagnosed with blood tests that demonstrate the genetic trait. It is often diagnosed during the newborn period, due to mandatory testing nationwide. A variety of blood tests, X-rays, CT scans, MRI scans, ultrasounds, and lung function tests may also be done to monitor the condition. TREATMENT  Sickle  cell anemia may be treated with:  Medicines. You may be given pain medicines, antibiotic medicines (to treat and prevent infections) or medicines to increase the production of certain types of hemoglobin.  Fluids.  Oxygen.  Blood transfusions. HOME CARE INSTRUCTIONS   Drink enough fluid to keep your urine clear or pale yellow. Increase your fluid intake in hot weather and during exercise.  Do not smoke. Smoking lowers oxygen levels in the blood.   Only take over-the-counter or prescription medicines for pain, fever, or discomfort as directed by your health care provider.  Take antibiotics as directed by your health care provider. Make sure you finish them it even if you start to feel better.   Take supplements as directed by your health care provider.   Consider wearing a medical alert bracelet. This tells anyone caring for you in an emergency of your condition.   When traveling, keep your medical information, health care provider's names, and the medicines you take with you at all times.   If you develop a fever, do not take medicines to reduce the fever right away. This could cover up a problem that is developing. Notify your health care provider.  Keep all follow-up appointments with your health care provider. Sickle cell anemia requires regular medical care. SEEK MEDICAL CARE IF: You have a fever. SEEK IMMEDIATE MEDICAL CARE IF:   You feel dizzy or faint.   You have new abdominal pain, especially on the left side near the stomach area.   You develop a persistent, often uncomfortable and painful penile erection (priapism). If this is not treated immediately it   will lead to impotence.   You have numbness your arms or legs or you have a hard time moving them.   You have a hard time with speech.   You have a fever or persistent symptoms for more than 2-3 days.   You have a fever and your symptoms suddenly get worse.   You have signs or symptoms of infection.  These include:   Chills.   Abnormal tiredness (lethargy).   Irritability.   Poor eating.   Vomiting.   You develop pain that is not helped with medicine.   You develop shortness of breath.  You have pain in your chest.   You are coughing up pus-like or bloody sputum.   You develop a stiff neck.  Your feet or hands swell or have pain.  Your abdomen appears bloated.  You develop joint pain. MAKE SURE YOU:  Understand these instructions.   This information is not intended to replace advice given to you by your health care provider. Make sure you discuss any questions you have with your health care provider.   Document Released: 08/07/2005 Document Revised: 05/20/2014 Document Reviewed: 12/09/2012 Elsevier Interactive Patient Education 2016 Elsevier Inc.  

## 2015-10-10 NOTE — Progress Notes (Signed)
Patient is here for FU SCD  Patient complains of left shoulder pain being present for the past few months.  Patient has taken medication today and patient has eaten.

## 2015-10-10 NOTE — Progress Notes (Signed)
Patient ID: Cassie Montgomery, female   DOB: 06/11/1970, 45 y.o.   MRN: [id number]   Dalal Hatem, is a 45 y.o. female  [id number]  [id number]  DOB - [birth year]  Chief Complaint  Patient presents with  . Follow-up    SCD        Subjective:   Cassie Montgomery is a 45 y.o. female with history of sickle cell disease here today for a follow up visit. She has no new complaints today. No ED visit since last follow-up, and is compliant with her medications, keeping well with hydration and regular physical exercise. Her left shoulder pain persists although she got an intra-articular injection recently which has been helping some. She wonders if this pain will ever go away but she will not like to have surgery. Her menstrual period is still irregular, pregnancy test today is negative. She is sexually active with only 1 partner, no vaginal discharge or pelvic pain. She is up-to-date with her health maintenance. She has had bag under her eyes bilaterally for years and has been worked up without major findings. The swelling is occasionally painful, but no redness. Patient has No headache, No chest pain, No abdominal pain - No Nausea, No new weakness tingling or numbness, No Cough - SOB.  Problem  Periorbital Edema  Dyslipidemia    ALLERGIES: Allergies  Allergen Reactions  . Hydrea [Hydroxyurea] Nausea Only  . Sulfa Antibiotics Other (See Comments)    Increased bilirubin    PAST MEDICAL HISTORY: Past Medical History  Diagnosis Date  . PMDD (premenstrual dysphoric disorder)   . Sickle cell anemia (HCC)   . Sickle cell disease without crisis ([place]) 07/15/2011  . Migraine with aura 07/15/2011  . GERD (gastroesophageal reflux disease)   . Asthma in adult     probable  . Depression   . Chronic pain syndrome   . Left arm pain     chronic  . Anxiety   . Ganglion cyst     MEDICATIONS AT HOME: Prior to Admission medications   Medication Sig Start Date End Date Taking? Authorizing  Provider  albuterol (PROVENTIL HFA;VENTOLIN HFA) 108 (90 BASE) MCG/ACT inhaler Inhale 2 puffs into the lungs every 6 (six) hours as needed for wheezing or shortness of breath (wheezing). 04/17/15  Yes Dorena Dew, FNP  Artificial Tear Ointment (DRY EYES OP) Apply 2 drops to eye daily as needed (dry eyes).   Yes Historical Provider, MD  diphenhydrAMINE (BENADRYL) 25 MG tablet Take 25-50 mg by mouth at bedtime as needed for sleep.   Yes Historical Provider, MD  fluticasone (FLONASE) 50 MCG/ACT nasal spray Place 2 sprays into both nostrils daily as needed for allergies or rhinitis (allergies).    Yes Historical Provider, MD  folic acid (FOLVITE) 1 MG tablet Take 1 tablet (1 mg total) by mouth every morning. 05/26/14  Yes [name], MD  ibuprofen (ADVIL,MOTRIN) 600 MG tablet Take 1 tablet (600 mg total) by mouth every 8 (eight) hours as needed for moderate pain. 11/30/14  Yes Micheline Chapman, NP  Oxycodone HCl 10 MG TABS Take 1 tablet (10 mg total) by mouth every 4 (four) hours as needed. Reported on 09/05/2015 10/03/15  Yes Dorena Dew, FNP  polyethylene glycol [facility] / GLYCOLAX) packet Take 17 g by mouth daily as needed for mild constipation.    Yes Historical Provider, MD  butalbital-aspirin-caffeine [facility]) 50-325-40 MG per capsule Take 1 capsule by mouth every 4 (four) hours as needed for headache. Patient not  taking: Reported on 12/28/2014 10/25/14   Dorena Dew, FNP     Objective:   Filed Vitals:   10/10/15 1206  BP: 116/66  Pulse: 84  Temp: 98.3 F (36.8 C)  TempSrc: Oral  Resp: 18  Height: 5\' 6"  (1.676 m)  Weight: 220 lb (99.791 kg)  SpO2: 100%    Exam General appearance : Awake, alert, not in any distress. Speech Clear. Not toxic looking, bilateral infraorbital edema HEENT: Atraumatic and Normocephalic, pupils equally reactive to light and accomodation Neck: supple, no JVD. No cervical lymphadenopathy.  Chest:Good air entry bilaterally, no added sounds   CVS: S1 S2 regular, no murmurs.  Abdomen: Bowel sounds present, Non tender and not distended with no gaurding, rigidity or rebound. Extremities: B/L Lower Ext shows no edema, both legs are warm to touch Neurology: Awake alert, and oriented X 3, CN II-XII intact, Non focal Skin:No Rash  Data Review Lab Results  Component Value Date   HGBA1C 4.1 11/24/2014   HGBA1C 4.0 08/27/2013   HGBA1C  07/27/2008    (NOTE)   The ADA recommends the following therapeutic goal for glycemic   control related to Hgb A1C measurement:   Goal of Therapy:   < 7.0% Hgb A1C   Reference: American Diabetes Association: Clinical Practice   Recommendations 2008, Diabetes Care,  2008, 31:(Suppl 1). Unable to determine HgbA1C using standard method due to interference of probable abnormal hemoglobin. Suggest Glycohemoglobin, Total (80088).      Assessment & Plan   1. Sickle cell anemia with pain (HCC) Not on Hydrea. We discussed the need for good hydration, monitoring of hydration status, avoidance of heat, cold, stress, and infection triggers. Continue folic acid 1 mg daily.   Acute and chronic painful episodes - We agreed on Opiate dose and amount of pills  per month. We discussed that pt is to receive Schedule II prescriptions only from our clinic. Pt is also aware that the prescription history is available to [place] online through the [facility] CSRS. Controlled substance agreement reviewed and signed. We reminded [place] that all patients receiving Schedule II narcotics must be seen for follow within one month of prescription being requested. We reviewed the terms of our pain agreement, including the need to keep medicines in a safe locked location away from children or pets, and the need to report excess sedation or constipation, measures to avoid constipation, and policies related to early refills and stolen prescriptions. According to the [place] [facility] program, we have reviewed details related to analgesia, adverse  effects and aberrant behaviors.  2. Infraorbital edema  - Urinalysis, Complete - Microalbumin, urine - CBC with Differential/Platelet - COMPLETE METABOLIC PANEL WITH GFR  3. Dyslipidemia  - Lipid panel  Patient have been counseled extensively about nutrition and exercise  Return in about 3 months (around 01/10/2016) for Sickle Cell Disease/Pain.  The patient was given clear instructions to go to ER or return to medical center if symptoms don't improve, worsen or new problems develop. The patient verbalized understanding. The patient was told to call to get lab results if they haven't heard anything in the next week.   This note has been created with Surveyor, quantity. Any transcriptional errors are unintentional.    Angelica Chessman, MD, [place], [place], [place], [place] and [place], [place]   10/10/2015, 12:49 PM

## 2015-10-11 LAB — URINALYSIS, COMPLETE
BACTERIA UA: NONE SEEN [HPF]
BILIRUBIN URINE: NEGATIVE
CASTS: NONE SEEN [LPF]
CRYSTALS: NONE SEEN [HPF]
Glucose, UA: NEGATIVE
Hgb urine dipstick: NEGATIVE
Ketones, ur: NEGATIVE
Leukocytes, UA: NEGATIVE
Nitrite: NEGATIVE
Specific Gravity, Urine: 1.017 (ref 1.001–1.035)
WBC UA: NONE SEEN WBC/HPF (ref <=5)
YEAST: NONE SEEN [HPF]
pH: 5.5 (ref 5.0–8.0)

## 2015-10-11 LAB — MICROALBUMIN, URINE: MICROALB UR: 6.8 mg/dL

## 2015-10-23 ENCOUNTER — Telehealth: Payer: Self-pay

## 2015-10-23 NOTE — Telephone Encounter (Signed)
Pt is requesting a medication refill for Oxycodone, 10mg. Thanks! 

## 2015-10-23 NOTE — Telephone Encounter (Signed)
Refill request for oxycodone 10mg . LOV 10/10/2015. Please advise. Thanks!

## 2015-10-24 ENCOUNTER — Other Ambulatory Visit: Payer: Self-pay | Admitting: Internal Medicine

## 2015-10-24 DIAGNOSIS — D5700 Hb-SS disease with crisis, unspecified: Secondary | ICD-10-CM

## 2015-10-24 MED ORDER — OXYCODONE HCL 10 MG PO TABS: 10.0000 mg | ORAL_TABLET | ORAL | Status: DC | PRN

## 2015-10-31 DIAGNOSIS — D571 Sickle-cell disease without crisis: Secondary | ICD-10-CM | POA: Diagnosis not present

## 2015-10-31 DIAGNOSIS — H04123 Dry eye syndrome of bilateral lacrimal glands: Secondary | ICD-10-CM | POA: Diagnosis not present

## 2015-10-31 DIAGNOSIS — H2513 Age-related nuclear cataract, bilateral: Secondary | ICD-10-CM | POA: Diagnosis not present

## 2015-10-31 DIAGNOSIS — M3501 Sicca syndrome with keratoconjunctivitis: Secondary | ICD-10-CM | POA: Diagnosis not present

## 2015-11-02 ENCOUNTER — Telehealth: Payer: Self-pay | Admitting: *Deleted

## 2015-11-02 NOTE — Telephone Encounter (Signed)
Voicemail states to give a call back to [place] with [facility] at [phone]. Medical Assistant left message on patient's home and cell voicemail.

## 2015-11-02 NOTE — Telephone Encounter (Signed)
-----   Message from Tresa Garter, MD sent at 10/25/2015 10:43 AM EDT ----- Please inform patient her laboratory results are mostly normal, hemoglobin is stable. No evidence of abnormal kidney function. No change in current medications and treatment.

## 2015-11-02 NOTE — Telephone Encounter (Signed)
Patient verified DOB Patient is aware of lab results being mostly normal and hemoglobin being stable. Patient is aware of no abnormal kidney function being present. Patient is aware of no changes being made to current medication or treatment. No further questions at this time.

## 2015-11-09 ENCOUNTER — Telehealth: Payer: Self-pay

## 2015-11-09 DIAGNOSIS — D5700 Hb-SS disease with crisis, unspecified: Secondary | ICD-10-CM

## 2015-11-09 NOTE — Telephone Encounter (Signed)
Refill request for oxycodone 10mg . LOV 10/10/2015. Please advise. Thanks!

## 2015-11-09 NOTE — Telephone Encounter (Signed)
Pt is requesting a medication refill on Oxycodone, 10mg . Thanks!

## 2015-11-10 ENCOUNTER — Other Ambulatory Visit: Payer: Self-pay

## 2015-11-10 MED ORDER — OXYCODONE HCL 10 MG PO TABS: 10.0000 mg | ORAL_TABLET | ORAL | Status: DC | PRN

## 2015-11-10 NOTE — Telephone Encounter (Signed)
[place] can you please refill oxycodone for patient? Lov 10/10/2015 with [place]. Please advise. Thanks!

## 2015-11-10 NOTE — Telephone Encounter (Signed)
Reviewed [place] Substance Reporting system prior to prescribing opiate medications, no inconsistencies noted.   Meds ordered this encounter  Medications  . Oxycodone HCl 10 MG TABS    Sig: Take 1 tablet (10 mg total) by mouth every 4 (four) hours as needed.    Dispense:  90 tablet    Refill:  0    Order Specific Question:  Supervising Provider    Answer:  Tresa Garter [id number]    Dorena Dew, FNP

## 2015-11-28 ENCOUNTER — Other Ambulatory Visit: Payer: Self-pay | Admitting: *Deleted

## 2015-11-28 ENCOUNTER — Encounter (HOSPITAL_COMMUNITY): Payer: Self-pay | Admitting: Emergency Medicine

## 2015-11-28 ENCOUNTER — Other Ambulatory Visit: Payer: Self-pay | Admitting: Internal Medicine

## 2015-11-28 ENCOUNTER — Telehealth: Payer: Self-pay

## 2015-11-28 ENCOUNTER — Emergency Department (HOSPITAL_COMMUNITY)
Admission: EM | Admit: 2015-11-28 | Discharge: 2015-11-29 | Disposition: A | Discharge status: Home / Self Care | Payer: Medicare Other | Attending: Emergency Medicine | Admitting: Emergency Medicine

## 2015-11-28 ENCOUNTER — Emergency Department (HOSPITAL_COMMUNITY): Payer: Medicare Other

## 2015-11-28 DIAGNOSIS — R05 Cough: Secondary | ICD-10-CM | POA: Diagnosis not present

## 2015-11-28 DIAGNOSIS — Z79899 Other long term (current) drug therapy: Secondary | ICD-10-CM | POA: Insufficient documentation

## 2015-11-28 DIAGNOSIS — J45909 Unspecified asthma, uncomplicated: Secondary | ICD-10-CM | POA: Insufficient documentation

## 2015-11-28 DIAGNOSIS — R112 Nausea with vomiting, unspecified: Secondary | ICD-10-CM | POA: Diagnosis not present

## 2015-11-28 DIAGNOSIS — Z79891 Long term (current) use of opiate analgesic: Secondary | ICD-10-CM | POA: Diagnosis not present

## 2015-11-28 DIAGNOSIS — K219 Gastro-esophageal reflux disease without esophagitis: Secondary | ICD-10-CM | POA: Diagnosis not present

## 2015-11-28 DIAGNOSIS — D5700 Hb-SS disease with crisis, unspecified: Secondary | ICD-10-CM

## 2015-11-28 DIAGNOSIS — F329 Major depressive disorder, single episode, unspecified: Secondary | ICD-10-CM | POA: Insufficient documentation

## 2015-11-28 DIAGNOSIS — R079 Chest pain, unspecified: Secondary | ICD-10-CM

## 2015-11-28 DIAGNOSIS — R1013 Epigastric pain: Secondary | ICD-10-CM

## 2015-11-28 DIAGNOSIS — R109 Unspecified abdominal pain: Secondary | ICD-10-CM | POA: Diagnosis present

## 2015-11-28 DIAGNOSIS — R059 Cough, unspecified: Secondary | ICD-10-CM

## 2015-11-28 LAB — COMPREHENSIVE METABOLIC PANEL
ALK PHOS: 59 U/L (ref 38–126)
ALT: 17 U/L (ref 14–54)
AST: 20 U/L (ref 15–41)
Albumin: 4.5 g/dL (ref 3.5–5.0)
Anion gap: 8 (ref 5–15)
BILIRUBIN TOTAL: 3.4 mg/dL — AB (ref 0.3–1.2)
BUN: 13 mg/dL (ref 6–20)
CALCIUM: 9.2 mg/dL (ref 8.9–10.3)
CO2: 27 mmol/L (ref 22–32)
CREATININE: 0.89 mg/dL (ref 0.44–1.00)
Chloride: 104 mmol/L (ref 101–111)
Glucose, Bld: 92 mg/dL (ref 65–99)
Potassium: 2.9 mmol/L — ABNORMAL LOW (ref 3.5–5.1)
Sodium: 139 mmol/L (ref 135–145)
Total Protein: 7.7 g/dL (ref 6.5–8.1)

## 2015-11-28 LAB — CBC
HCT: 32.5 % — ABNORMAL LOW (ref 36.0–46.0)
Hemoglobin: 11.7 g/dL — ABNORMAL LOW (ref 12.0–15.0)
MCH: 25.6 pg — AB (ref 26.0–34.0)
MCHC: 36 g/dL (ref 30.0–36.0)
MCV: 71.1 fL — ABNORMAL LOW (ref 78.0–100.0)
PLATELETS: 138 10*3/uL — AB (ref 150–400)
RBC: 4.57 MIL/uL (ref 3.87–5.11)
RDW: 16.7 % — ABNORMAL HIGH (ref 11.5–15.5)
WBC: 10.2 10*3/uL (ref 4.0–10.5)

## 2015-11-28 LAB — URINALYSIS, ROUTINE W REFLEX MICROSCOPIC
Bilirubin Urine: NEGATIVE
Glucose, UA: NEGATIVE mg/dL
HGB URINE DIPSTICK: NEGATIVE
Ketones, ur: NEGATIVE mg/dL
LEUKOCYTES UA: NEGATIVE
Nitrite: NEGATIVE
PROTEIN: 30 mg/dL — AB
Specific Gravity, Urine: 1.018 (ref 1.005–1.030)
pH: 5.5 (ref 5.0–8.0)

## 2015-11-28 LAB — URINE MICROSCOPIC-ADD ON: RBC / HPF: NONE SEEN RBC/hpf (ref 0–5)

## 2015-11-28 LAB — I-STAT TROPONIN, ED: TROPONIN I, POC: 0 ng/mL (ref 0.00–0.08)

## 2015-11-28 LAB — PREGNANCY, URINE: Preg Test, Ur: NEGATIVE

## 2015-11-28 LAB — LIPASE, BLOOD: Lipase: 11 U/L (ref 11–51)

## 2015-11-28 LAB — RETICULOCYTES
RBC.: 4.61 MIL/uL (ref 3.87–5.11)
Retic Count, Absolute: 170.6 10*3/uL (ref 19.0–186.0)
Retic Ct Pct: 3.7 % — ABNORMAL HIGH (ref 0.4–3.1)

## 2015-11-28 MED ORDER — POTASSIUM CHLORIDE 10 MEQ/100ML IV SOLN
10.0000 meq | Freq: Once | INTRAVENOUS | Status: AC
  Administered 2015-11-28: 10 meq via INTRAVENOUS
  Filled 2015-11-28: qty 100

## 2015-11-28 MED ORDER — GI COCKTAIL ~~LOC~~
30.0000 mL | Freq: Once | ORAL | Status: AC
  Administered 2015-11-28: 30 mL via ORAL
  Filled 2015-11-28: qty 30

## 2015-11-28 MED ORDER — SODIUM CHLORIDE 0.9 % IV BOLUS (SEPSIS)
1000.0000 mL | Freq: Once | INTRAVENOUS | Status: AC
  Administered 2015-11-28: 1000 mL via INTRAVENOUS

## 2015-11-28 MED ORDER — OXYCODONE HCL 10 MG PO TABS: 10.0000 mg | ORAL_TABLET | ORAL | Status: DC | PRN

## 2015-11-28 MED ORDER — SUCRALFATE 1 GM/10ML PO SUSP: 1.0000 g | Freq: Three times a day (TID) | ORAL | Status: DC

## 2015-11-28 MED ORDER — OXYCODONE-ACETAMINOPHEN 5-325 MG PO TABS
2.0000 | ORAL_TABLET | Freq: Once | ORAL | Status: AC
  Administered 2015-11-28: 2 via ORAL
  Filled 2015-11-28: qty 2

## 2015-11-28 MED ORDER — HYDROMORPHONE HCL 1 MG/ML IJ SOLN
1.0000 mg | Freq: Once | INTRAMUSCULAR | Status: AC
  Administered 2015-11-28: 1 mg via INTRAVENOUS
  Filled 2015-11-28: qty 1

## 2015-11-28 MED ORDER — POTASSIUM CHLORIDE CRYS ER 20 MEQ PO TBCR
40.0000 meq | EXTENDED_RELEASE_TABLET | Freq: Once | ORAL | Status: AC
  Administered 2015-11-28: 40 meq via ORAL
  Filled 2015-11-28: qty 2

## 2015-11-28 MED ORDER — OMEPRAZOLE 20 MG PO CPDR: 20.0000 mg | DELAYED_RELEASE_CAPSULE | Freq: Every day | ORAL | Status: DC

## 2015-11-28 NOTE — Telephone Encounter (Signed)
Dr. Doreene Burke, Patient is requesting a refill on oxycodone. Please advise. Thanks!

## 2015-11-28 NOTE — ED Provider Notes (Signed)
CSN: [id number]     Arrival date & time 11/28/15  [id number] History   First MD Initiated Contact with Patient 11/28/15 1843     Chief Complaint  Patient presents with  . Abdominal Pain  . Cough   HPI  Cassie Montgomery is a 45 year old female with PMHx of sickle cell anemia and GERD presenting with cough, abdominal pain and vomiting. Pt reports symptoms have been intermittent over the past few weeks with acute worsening yesterday. Pt states that she has had a dry cough intermittently and yesterday this began producing yellow sputum. She states that occasionally she coughs so hard that she vomits. She notes that she would vomit 1-2 per week with several episodes yesterday. She states the vomit is very bilious. She notes that she is now experiencing epigastric abdominal pain that radiates up to her central chest and to the base of her throat. Her chest pain is not exertional and is not associated with diaphoresis, dizziness, palpitations or SOB. She describes the pain as burning and "painful". She is unsure if food exacerbates this but notes that her appetite has been decreased due to the nausea. She states her coughing and vomiting are worse at night and occasionally wake her from sleep. She also reports increased belching when sitting upright. She states that her current symptoms do not feel like her typical sickle cell pain. She took oxycodone at home without relief of symptoms. She has taken no other OTC symptom relievers. She reports a history of GERD and H pylori but does not take daily medications. Denies fevers, chills, congestion, rhinorrhea, sore throat, sneezing, shortness of breath, wheezing, back pain, palpitations, diarrhea, constipation, dysuria, extremity pain or extremity weakness.   Past Medical History  Diagnosis Date  . PMDD (premenstrual dysphoric disorder)   . Sickle cell anemia (HCC)   . Sickle cell disease without crisis ([place]) 07/15/2011  . Migraine with aura 07/15/2011  . GERD  (gastroesophageal reflux disease)   . Asthma in adult     probable  . Depression   . Chronic pain syndrome   . Left arm pain     chronic  . Anxiety   . Ganglion cyst    Past Surgical History  Procedure Laterality Date  . Cesarean section      2   Family History  Problem Relation Age of Onset  . Hypertension Mother   . Asthma Son   . Asthma Son   . Asthma Daughter    Social History  Substance Use Topics  . Smoking status: Never Smoker   . Smokeless tobacco: None  . Alcohol Use: No   OB History    Gravida Para Term Preterm AB TAB SAB Ectopic Multiple Living   3         3     Review of Systems  All other systems reviewed and are negative.     Allergies  Hydrea; Latex; and Sulfa antibiotics  Home Medications   Prior to Admission medications   Medication Sig Start Date End Date Taking? Authorizing Provider  acetaminophen (TYLENOL) 500 MG tablet Take 500 mg by mouth every 6 (six) hours as needed for fever.   Yes Historical Provider, MD  diphenhydrAMINE (BENADRYL) 25 MG tablet Take 1-2 tablets (25-50 mg total) by mouth at bedtime as needed for sleep. 10/10/15  Yes Tresa Garter, MD  folic acid (FOLVITE) 1 MG tablet Take 1 tablet (1 mg total) by mouth every morning. 05/26/14  Yes [name],  MD  Oxycodone HCl 10 MG TABS Take 1 tablet (10 mg total) by mouth every 4 (four) hours as needed. 11/28/15  Yes Olugbemiga Essie Christine, MD  RESTASIS MULTIDOSE 0.05 % ophthalmic emulsion INSTILL 1 DROP INTO BOTH EYES TWICE A DAY 11/01/15  Yes Historical Provider, MD  albuterol (PROVENTIL HFA;VENTOLIN HFA) 108 (90 BASE) MCG/ACT inhaler Inhale 2 puffs into the lungs every 6 (six) hours as needed for wheezing or shortness of breath (wheezing). 04/17/15   Dorena Dew, FNP  Artificial Tear Ointment (DRY EYES OP) Apply 2 drops to eye daily as needed (dry eyes).    Historical Provider, MD  butalbital-aspirin-caffeine [facility]) 50-325-40 MG per capsule Take 1 capsule by mouth  every 4 (four) hours as needed for headache. Patient not taking: Reported on 12/28/2014 10/25/14   Dorena Dew, FNP  fluticasone [facility]) 50 MCG/ACT nasal spray Place 2 sprays into both nostrils daily as needed for allergies or rhinitis (allergies).     Historical Provider, MD  ibuprofen (ADVIL,MOTRIN) 600 MG tablet Take 1 tablet (600 mg total) by mouth every 8 (eight) hours as needed for moderate pain. Patient not taking: Reported on 11/28/2015 11/30/14   Micheline Chapman, NP  omeprazole (PRILOSEC) 20 MG capsule Take 1 capsule (20 mg total) by mouth daily. 11/28/15   Amorette Charrette, PA-C  polyethylene glycol (MIRALAX / GLYCOLAX) packet Take 17 g by mouth daily as needed for mild constipation.     Historical Provider, MD  sucralfate (CARAFATE) 1 GM/10ML suspension Take 10 mLs (1 g total) by mouth 4 (four) times daily -  with meals and at bedtime. 11/28/15   Ved Martos, PA-C   BP 115/72 mmHg  Pulse 106  Temp(Src) 98 F (36.7 C) (Oral)  Resp 22  SpO2 97%  LMP 06/27/2015 (Approximate) Physical Exam  Constitutional: She appears well-developed and well-nourished. No distress.  HENT:  Head: Normocephalic and atraumatic.  Right Ear: Tympanic membrane and ear canal normal.  Left Ear: Tympanic membrane and ear canal normal.  Nose: Nose normal. No mucosal edema or rhinorrhea.  Mouth/Throat: Uvula is midline and oropharynx is clear and moist.  Eyes: Conjunctivae are normal. Right eye exhibits no discharge. Left eye exhibits no discharge. No scleral icterus.  Neck: Normal range of motion.  Cardiovascular: Normal rate, regular rhythm and normal heart sounds.   Pulmonary/Chest: Effort normal and breath sounds normal. No respiratory distress. She has no wheezes. She exhibits no tenderness.  Abdominal: Soft. Bowel sounds are normal. She exhibits no distension. There is no tenderness. There is no rebound and no guarding.  Musculoskeletal: Normal range of motion.  Moving all extremities spontaneously.  No TTP. All joints are supple without swelling or deformity. Ambulates unassisted with a steady gait.   Neurological: She is alert. Coordination normal.  Skin: Skin is warm and dry. No rash noted.  Psychiatric: She has a normal mood and affect. Her behavior is normal.  Nursing note and vitals reviewed.   ED Course  Procedures (including critical care time) Labs Review Labs Reviewed  COMPREHENSIVE METABOLIC PANEL - Abnormal; Notable for the following:    Potassium 2.9 (*)    Total Bilirubin 3.4 (*)    All other components within normal limits  CBC - Abnormal; Notable for the following:    Hemoglobin 11.7 (*)    HCT 32.5 (*)    MCV 71.1 (*)    MCH 25.6 (*)    RDW 16.7 (*)    Platelets 138 (*)    All  other components within normal limits  URINALYSIS, ROUTINE W REFLEX MICROSCOPIC (NOT AT [facility]) - Abnormal; Notable for the following:    Color, Urine AMBER (*)    APPearance CLOUDY (*)    Protein, ur 30 (*)    All other components within normal limits  RETICULOCYTES - Abnormal; Notable for the following:    Retic Ct Pct 3.7 (*)    All other components within normal limits  URINE MICROSCOPIC-ADD ON - Abnormal; Notable for the following:    Squamous Epithelial / LPF 0-5 (*)    Bacteria, UA MANY (*)    Casts HYALINE CASTS (*)    All other components within normal limits  LIPASE, BLOOD  PREGNANCY, URINE  I-STAT TROPOININ, ED    Imaging Review Dg Chest 2 View  11/28/2015  CLINICAL DATA:  Acute cough with vomiting, history of asthma, sickle cell disease, occasional smoker EXAM: CHEST  2 VIEW COMPARISON:  03/22/2015 FINDINGS: The heart size and mediastinal contours are within normal limits. Both lungs are clear. The visualized skeletal structures are unremarkable. IMPRESSION: No active cardiopulmonary disease. Electronically Signed   By: Jerilynn Mages.  Shick M.D.   On: 11/28/2015 19:36   I have personally reviewed and evaluated these images and lab results as part of my medical  decision-making.   EKG Interpretation   Date/Time:  Tuesday November 28 2015 19:53:54 EDT Ventricular Rate:  64 PR Interval:    QRS Duration: 95 QT Interval:  454 QTC Calculation: 469 R Axis:   61 Text Interpretation:  Sinus rhythm No acute changes Confirmed by LIU MD,  Hinton Dyer [phone]) on 11/28/2015 10:11:21 PM      MDM   Final diagnoses:  Non-intractable vomiting with nausea, vomiting of unspecified type  Cough  Chest pain, unspecified chest pain type  Epigastric pain  Gastroesophageal reflux disease, esophagitis presence not specified   45 year old female presenting with cough, nausea and vomiting times multiple weeks of worsening yesterday and now epigastric abdominal pain. Afebrile and hemodynamically stable. Patient is nontoxic-appearing. Abdomen is soft without tenderness. No peritoneal signs. Heart regular rate and rhythm. Lungs clear to auscultation bilaterally. No oral pharyngeal erythema. All extremities are neurovascularly intact with full range of motion. No leukocytosis. Hemoglobin at patient's baseline. Hypokalemic which was repleted in emergency department. Reticulocyte count slightly elevated. UA with many bacteria with squams and negative leuks. Troponin negative with non-ischemic ECG. CXR negative. GI cocktail which pt reports improved her burning sensation in chest and abdomen. Pt's chest pain is atypical and likely related to her GERD. This does not appear to be a sickle cell crises. Pt reports feeling well and ready for discharge home. Discussed pt with Dr. Oleta Mouse who agrees with assessment and plan. Pt is to follow up with GI. Discharged on omeprazole and carafate. Strict chest pain and sickle cell return precautions given. Pt states understanding and is stable for discharge.     Lahoma Crocker Faythe Heitzenrater, PA-C 11/28/15 2224  Forde Dandy, MD 11/29/15 1355

## 2015-11-28 NOTE — Discharge Instructions (Signed)
Chest Pain Observation °It is often hard to give a specific diagnosis for the cause of chest pain. Among other possibilities your symptoms might be caused by inadequate oxygen delivery to your heart (angina). Angina that is not treated or evaluated can lead to a heart attack (myocardial infarction) or death. °Blood tests, electrocardiograms, and X-rays may have been done to help determine a possible cause of your chest pain. After evaluation and observation, your health care provider has determined that it is unlikely your pain was caused by an unstable condition that requires hospitalization. However, a full evaluation of your pain may need to be completed, with additional diagnostic testing as directed. It is very important to keep your follow-up appointments. Not keeping your follow-up appointments could result in permanent heart damage, disability, or death. If there is any problem keeping your follow-up appointments, you must call your health care provider. °HOME CARE INSTRUCTIONS  °Due to the slight chance that your pain could be angina, it is important to follow your health care provider's treatment plan and also maintain a healthy lifestyle: °· Maintain or work toward achieving a healthy weight. °· Stay physically active and exercise regularly. °· Decrease your salt intake. °· Eat a balanced, healthy diet. Talk to a dietitian to learn about heart-healthy foods. °· Increase your fiber intake by including whole grains, vegetables, fruits, and nuts in your diet. °· Avoid situations that cause stress, anger, or depression. °· Take medicines as advised by your health care provider. Report any side effects to your health care provider. Do not stop medicines or adjust the dosages on your own. °· Quit smoking. Do not use nicotine patches or gum until you check with your health care provider. °· Keep your blood pressure, blood sugar, and cholesterol levels within normal limits. °· Limit alcohol intake to no more than  1 drink per day for women who are not pregnant and 2 drinks per day for men. °· Do not abuse drugs. °SEEK IMMEDIATE MEDICAL CARE IF: °You have severe chest pain or pressure which may include symptoms such as: °· You feel pain or pressure in your arms, neck, jaw, or back. °· You have severe back or abdominal pain, feel sick to your stomach (nauseous), or throw up (vomit). °· You are sweating profusely. °· You are having a fast or irregular heartbeat. °· You feel short of breath while at rest. °· You notice increasing shortness of breath during rest, sleep, or with activity. °· You have chest pain that does not get better after rest or after taking your usual medicine. °· You wake from sleep with chest pain. °· You are unable to sleep because you cannot breathe. °· You develop a frequent cough or you are coughing up blood. °· You feel dizzy, faint, or experience extreme fatigue. °· You develop severe weakness, dizziness, fainting, or chills. °Any of these symptoms may represent a serious problem that is an emergency. Do not wait to see if the symptoms will go away. Call your local emergency services (911 in the [place] Do not drive yourself to the hospital. °MAKE SURE YOU: °· Understand these instructions. °· Will watch your condition. °· Will get help right away if you are not doing well or get worse. °  °This information is not intended to replace advice given to you by your health care provider. Make sure you discuss any questions you have with your health care provider. °  °Document Released: 06/01/2010 Document Revised: 05/04/2013 Document Reviewed: 10/29/2012 °Elsevier Interactive Patient   Education 2016 [place] for Gastroesophageal Reflux Disease, Adult When you have gastroesophageal reflux disease (GERD), the foods you eat and your eating habits are very important. Choosing the right foods can help ease the discomfort of GERD. WHAT GENERAL GUIDELINES DO I NEED TO FOLLOW?  Choose fruits,  vegetables, whole grains, low-fat dairy products, and low-fat meat, fish, and poultry.  Limit fats such as oils, salad dressings, butter, nuts, and avocado.  Keep a food diary to identify foods that cause symptoms.  Avoid foods that cause reflux. These may be different for different people.  Eat frequent small meals instead of three large meals each day.  Eat your meals slowly, in a relaxed setting.  Limit fried foods.  Cook foods using methods other than frying.  Avoid drinking alcohol.  Avoid drinking large amounts of liquids with your meals.  Avoid bending over or lying down until 2-3 hours after eating. WHAT FOODS ARE NOT RECOMMENDED? The following are some foods and drinks that may worsen your symptoms: Vegetables Tomatoes. Tomato juice. Tomato and spaghetti sauce. Chili peppers. Onion and garlic. Horseradish. Fruits Oranges, grapefruit, and lemon (fruit and juice). Meats High-fat meats, fish, and poultry. This includes hot dogs, ribs, ham, sausage, salami, and bacon. Dairy Whole milk and chocolate milk. Sour cream. Cream. Butter. Ice cream. Cream cheese.  Beverages Coffee and tea, with or without caffeine. Carbonated beverages or energy drinks. Condiments Hot sauce. Barbecue sauce.  Sweets/Desserts Chocolate and cocoa. Donuts. Peppermint and spearmint. Fats and Oils High-fat foods, including [place] fries and potato chips. Other Vinegar. Strong spices, such as black pepper, white pepper, red pepper, cayenne, curry powder, cloves, ginger, and chili powder. The items listed above may not be a complete list of foods and beverages to avoid. Contact your dietitian for more information.   This information is not intended to replace advice given to you by your health care provider. Make sure you discuss any questions you have with your health care provider.   Document Released: 04/29/2005 Document Revised: 05/20/2014 Document Reviewed: 03/03/2013 Elsevier Interactive  Patient Education 2016 [place]. Gastroesophageal Reflux Disease, Adult Normally, food travels down the esophagus and stays in the stomach to be digested. However, when a person has gastroesophageal reflux disease (GERD), food and stomach acid move back up into the esophagus. When this happens, the esophagus becomes sore and inflamed. Over time, GERD can create small holes (ulcers) in the lining of the esophagus.  CAUSES This condition is caused by a problem with the muscle between the esophagus and the stomach (lower esophageal sphincter, or LES). Normally, the LES muscle closes after food passes through the esophagus to the stomach. When the LES is weakened or abnormal, it does not close properly, and that allows food and stomach acid to go back up into the esophagus. The LES can be weakened by certain dietary substances, medicines, and medical conditions, including:  Tobacco use.  Pregnancy.  Having a hiatal hernia.  Heavy alcohol use.  Certain foods and beverages, such as coffee, chocolate, onions, and peppermint. RISK FACTORS This condition is more likely to develop in:  People who have an increased body weight.  People who have connective tissue disorders.  People who use NSAID medicines. SYMPTOMS Symptoms of this condition include:  Heartburn.  Difficult or painful swallowing.  The feeling of having a lump in the throat.  Abitter taste in the mouth.  Bad breath.  Having a large amount of saliva.  Having an upset or bloated stomach.  Belching.  Chest pain.  Shortness of breath or wheezing.  Ongoing (chronic) cough or a night-time cough.  Wearing away of tooth enamel.  Weight loss. Different conditions can cause chest pain. Make sure to see your health care provider if you experience chest pain. DIAGNOSIS Your health care provider will take a medical history and perform a physical exam. To determine if you have mild or severe GERD, your health care  provider may also monitor how you respond to treatment. You may also have other tests, including:  An endoscopy toexamine your stomach and esophagus with a small camera.  A test thatmeasures the acidity level in your esophagus.  A test thatmeasures how much pressure is on your esophagus.  A barium swallow or modified barium swallow to show the shape, size, and functioning of your esophagus. TREATMENT The goal of treatment is to help relieve your symptoms and to prevent complications. Treatment for this condition may vary depending on how severe your symptoms are. Your health care provider may recommend:  Changes to your diet.  Medicine.  Surgery. HOME CARE INSTRUCTIONS Diet  Follow a diet as recommended by your health care provider. This may involve avoiding foods and drinks such as:  Coffee and tea (with or without caffeine).  Drinks that containalcohol.  Energy drinks and sports drinks.  Carbonated drinks or sodas.  Chocolate and cocoa.  Peppermint and mint flavorings.  Garlic and onions.  Horseradish.  Spicy and acidic foods, including peppers, chili powder, curry powder, vinegar, hot sauces, and barbecue sauce.  Citrus fruit juices and citrus fruits, such as oranges, lemons, and limes.  Tomato-based foods, such as red sauce, chili, salsa, and pizza with red sauce.  Fried and fatty foods, such as donuts, french fries, potato chips, and high-fat dressings.  High-fat meats, such as hot dogs and fatty cuts of red and white meats, such as rib eye steak, sausage, ham, and bacon.  High-fat dairy items, such as whole milk, butter, and cream cheese.  Eat small, frequent meals instead of large meals.  Avoid drinking large amounts of liquid with your meals.  Avoid eating meals during the 2-3 hours before bedtime.  Avoid lying down right after you eat.  Do not exercise right after you eat. General Instructions  Pay attention to any changes in your  symptoms.  Take over-the-counter and prescription medicines only as told by your health care provider. Do not take aspirin, ibuprofen, or other NSAIDs unless your health care provider told you to do so.  Do not use any tobacco products, including cigarettes, chewing tobacco, and e-cigarettes. If you need help quitting, ask your health care provider.  Wear loose-fitting clothing. Do not wear anything tight around your waist that causes pressure on your abdomen.  Raise (elevate) the head of your bed 6 inches (15cm).  Try to reduce your stress, such as with yoga or meditation. If you need help reducing stress, ask your health care provider.  If you are overweight, reduce your weight to an amount that is healthy for you. Ask your health care provider for guidance about a safe weight loss goal.  Keep all follow-up visits as told by your health care provider. This is important. SEEK MEDICAL CARE IF:  You have new symptoms.  You have unexplained weight loss.  You have difficulty swallowing, or it hurts to swallow.  You have wheezing or a persistent cough.  Your symptoms do not improve with treatment.  You have a hoarse voice. [place]  IF:  You have pain in your arms, neck, jaw, teeth, or back.  You feel sweaty, dizzy, or light-headed.  You have chest pain or shortness of breath.  You vomit and your vomit looks like blood or coffee grounds.  You faint.  Your stool is bloody or black.  You cannot swallow, drink, or eat.   This information is not intended to replace advice given to you by your health care provider. Make sure you discuss any questions you have with your health care provider.   Document Released: 02/06/2005 Document Revised: 01/18/2015 Document Reviewed: 08/24/2014 Elsevier Interactive Patient Education [employer].

## 2015-11-28 NOTE — ED Notes (Signed)
Pt unable to void at this time. 

## 2015-11-28 NOTE — ED Notes (Signed)
Pt reports cough since Friday which has caused her to have emesis. Since pt has had emesis she has began to have abd pain. Hx of sickle cell, but not sure this is a sickle cell crisis. No diarrhea.

## 2015-11-28 NOTE — Telephone Encounter (Signed)
Pt is requesting a medication refill for her Oxycodone. Thanks!

## 2015-11-28 NOTE — ED Notes (Signed)
Original EKG given to Dr. Viviana Simpler, copy printed and given to [facility]. Stevi,PA states results were not showing in pt's chart.

## 2015-11-29 ENCOUNTER — Telehealth: Payer: Self-pay | Admitting: Gastroenterology

## 2015-11-29 ENCOUNTER — Other Ambulatory Visit: Payer: Self-pay | Admitting: Internal Medicine

## 2015-11-29 DIAGNOSIS — Z1231 Encounter for screening mammogram for malignant neoplasm of breast: Secondary | ICD-10-CM

## 2015-11-29 NOTE — Telephone Encounter (Signed)
Patient was seen in the ED for vomiting and GERD.  She has a remote history with Dr. Fuller Plan in 2010.  She will come in and see Nicoletta Ba PA on 12/04/15 3:00

## 2015-11-29 NOTE — Telephone Encounter (Signed)
Left message for patient to call back  

## 2015-12-04 ENCOUNTER — Encounter: Payer: Self-pay | Admitting: Physician Assistant

## 2015-12-04 ENCOUNTER — Ambulatory Visit (INDEPENDENT_AMBULATORY_CARE_PROVIDER_SITE_OTHER): Payer: Medicare Other | Admitting: Physician Assistant

## 2015-12-04 VITALS — BP 90/62 | HR 81 | Ht 66.0 in | Wt 212.4 lb

## 2015-12-04 DIAGNOSIS — R1110 Vomiting, unspecified: Secondary | ICD-10-CM

## 2015-12-04 DIAGNOSIS — R1013 Epigastric pain: Secondary | ICD-10-CM

## 2015-12-04 MED ORDER — ONDANSETRON HCL 4 MG PO TABS: 4.0000 mg | ORAL_TABLET | Freq: Four times a day (QID) | ORAL | 1 refills | Status: DC

## 2015-12-04 MED ORDER — OMEPRAZOLE 20 MG PO CPDR: 20.0000 mg | DELAYED_RELEASE_CAPSULE | Freq: Every day | ORAL | 3 refills | Status: DC

## 2015-12-04 NOTE — Patient Instructions (Addendum)
Continue the Prilosec 20 mg every morning. Finish the Carafate. We  Sent refills for Prilosec and Zofran 4 mg to your pharmacy. [facility].   You have been scheduled for an endoscopy. Please follow written instructions given to you at your visit today. If you use inhalers (even only as needed), please bring them with you on the day of your procedure. Your physician has requested that you go to www.startemmi.com and enter the access code given to you at your visit today. This web site gives a general overview about your procedure. However, you should still follow specific instructions given to you by our office regarding your preparation for the procedure.  You have been scheduled for an abdominal ultrasound at [facility] (1st floor of hospital) on Wednesday 12-06-2014-2017 at 7:30 am. Please arrive 15 minutes prior to your appointment for registration. Make certain not to have anything to eat or drink after midnight to your appointment. Should you need to reschedule your appointment, please contact radiology at [phone]. This test typically takes about 30 minutes to perform.

## 2015-12-04 NOTE — Progress Notes (Signed)
Subjective:    Patient ID: Cassie Montgomery, female    DOB: 09/06/1970, 45 y.o.   MRN: [id number]  HPI  Cassie Montgomery is a pleasant 45 year old African-American female known to Cassie Montgomery. She is referred by the emergency room currently after a recent visit with abdominal pain chest discomfort vomiting and complaints of burning in her chest and abdomen. This felt she likely had esophagitis and was given a prescription for Prilosec and Carafate. She said she had been having intermittent symptoms for a few weeks prior to that visit and then symptoms became worse for 2 days with persistent nausea and vomiting. Her potassium was 2.9, hemoglobin 11.7 hematocrit of 32.5 MCV of 71. She does have sickle cell anemia. She has not had prior EGD but did have colonoscopy in 2010 which was normal. Patient says with the omeprazole and Carafate she is feeling better and that her symptoms have a "slow down" but have not resolved. She continues to have intermittent nausea and had an episode of nausea with vomiting this morning. She also describes a rumbling type discomfort in her upper abdomen. She does use periodic NSAIDs though not on a daily basis. Overall she's had nausea off and on for the past 6 weeks to dysphagia or odynophagia. Appetite is been fine and weight has been stable. She is status post C-section no other abdominal surgeries, family history negative for gallbladder disease.  Review of Systems Pertinent positive and negative review of systems were noted in the above HPI section.  All other review of systems was otherwise negative.  Outpatient Encounter Prescriptions as of 12/04/2015  Medication Sig  . acetaminophen (TYLENOL) 500 MG tablet Take 500 mg by mouth every 6 (six) hours as needed for fever.  Marland Kitchen albuterol (PROVENTIL HFA;VENTOLIN HFA) 108 (90 BASE) MCG/ACT inhaler Inhale 2 puffs into the lungs every 6 (six) hours as needed for wheezing or shortness of breath (wheezing).  . Artificial Tear Ointment (DRY  EYES OP) Apply 2 drops to eye daily as needed (dry eyes).  . diphenhydrAMINE (BENADRYL) 25 MG tablet Take 1-2 tablets (25-50 mg total) by mouth at bedtime as needed for sleep.  . fluticasone (FLONASE) 50 MCG/ACT nasal spray Place 2 sprays into both nostrils daily as needed for allergies or rhinitis (allergies).   . folic acid (FOLVITE) 1 MG tablet Take 1 tablet (1 mg total) by mouth every morning.  Marland Kitchen ibuprofen (ADVIL,MOTRIN) 600 MG tablet Take 1 tablet (600 mg total) by mouth every 8 (eight) hours as needed for moderate pain.  Marland Kitchen omeprazole (PRILOSEC) 20 MG capsule Take 1 capsule (20 mg total) by mouth daily.  . Oxycodone HCl 10 MG TABS Take 1 tablet (10 mg total) by mouth every 4 (four) hours as needed.  . RESTASIS MULTIDOSE 0.05 % ophthalmic emulsion INSTILL 1 DROP INTO BOTH EYES TWICE A DAY  . sucralfate (CARAFATE) 1 GM/10ML suspension Take 10 mLs (1 g total) by mouth 4 (four) times daily -  with meals and at bedtime.  . [DISCONTINUED] omeprazole (PRILOSEC) 20 MG capsule Take 1 capsule (20 mg total) by mouth daily.  . ondansetron (ZOFRAN) 4 MG tablet Take 1 tablet (4 mg total) by mouth every 6 (six) hours.  . [DISCONTINUED] butalbital-aspirin-caffeine (FIORINAL) 50-325-40 MG per capsule Take 1 capsule by mouth every 4 (four) hours as needed for headache. (Patient not taking: Reported on 12/28/2014)  . [DISCONTINUED] polyethylene glycol (MIRALAX / GLYCOLAX) packet Take 17 g by mouth daily as needed for mild constipation.    No  facility-administered encounter medications on file as of 12/04/2015.    Allergies  Allergen Reactions  . Hydrea [Hydroxyurea] Nausea Only  . Latex Swelling  . Sulfa Antibiotics Other (See Comments)    Increased bilirubin   Patient Active Problem List   Diagnosis Date Noted  . Periorbital edema 10/10/2015  . Dyslipidemia 10/10/2015  . Chronic left shoulder pain 09/06/2015  . Sickle cell anemia with pain ([place]) 02/02/2015  . Cephalalgia 10/26/2014  . Medicare annual  wellness visit, initial 07/27/2014  . Vitamin D deficiency 06/28/2014  . GERD (gastroesophageal reflux disease) 06/12/2014  . Asthma in adult   . S/P cholecystectomy 06/11/2014  . Cholecystitis, acute 06/10/2014  . Chronic prescription opiate use 01/12/2014  . Depression, major ([place]) 10/07/2013  . PTSD (post-traumatic stress disorder) 10/07/2013  . Weight gain 09/27/2013  . Irregular menstrual cycle 05/12/2013  . Panic anxiety syndrome 05/11/2013  . Insomnia 05/11/2013  . Pap smear for cervical cancer screening 05/11/2013  . Chronic pain syndrome 01/06/2013  . Depressive disorder, not elsewhere classified 10/20/2012  . Generalized anxiety disorder 10/20/2012  . Opiate analgesic contract exists 09/11/2012  . Anemia 12/07/2011   Social History   Social History  . Marital status: Single    Spouse name: N/A  . Number of children: 3  . Years of education: N/A   Occupational History  . Not on file.   Social History Main Topics  . Smoking status: Never Smoker  . Smokeless tobacco: Never Used  . Alcohol use No  . Drug use: No  . Sexual activity: Yes   Other Topics Concern  . Not on file   Social History Narrative   Uses a cane occasionally.  Does not work.      Cassie Montgomery's family history includes Asthma in her daughter, son, and son; Hypertension in her mother.      Objective:    Vitals:   12/04/15 1317  BP: 90/62  Pulse: 81    Physical Exam well-developed African-American female in no acute distress, blood pressure 90/62 pulse 81 height 5 foot 6 weight 212 BMI 34. HEENT; nontraumatic normocephalic EOMI PERRLA sclera anicteric, Cardiovascular; regular rate and rhythm with S1-S2 no murmur or gallop, Pulmonary; clear bilaterally, Abdomen ;soft, she has some mild tenderness in the epigastrium no guarding or rebound no palpable mass or hepatosplenomegaly bowel sounds are present, Rectal ;exam not done, Extremities; no clubbing cyanosis or edema skin warm and dry,  Neuropsych; mood and affect appropriate       Assessment & Montgomery:   #45 year old African-American female with sickle cell anemia with 6 week history of recurring nausea and intermittent vomiting, epigastric discomfort. She has had some improvement in symptoms over the past week and a half with Carafate and omeprazole continues to complain of nausea and had an episode of vomiting this morning. Rule out peptic ulcer disease, NSAID-induced gastropathy, gallbladder disease  #2 chronic intermittent opiate use #3 anxiety/depression #4 PTSD  Montgomery; Continue omeprazole 20 mg by mouth every morning Complete current course of Carafate 4 times daily then stop Add Zofran 4 mg every 6 hours when necessary for nausea and vomiting #25 one refill Schedule for upper abdominal ultrasound Avoid NSAIDs Schedule for EGD with Cassie Montgomery. Procedure discussed in detail with patient, including risks and benefits and she is agreeable to proceed.    Montgomery;   Alfredia Ferguson PA-C 12/04/2015   Cc: Dorena Dew, FNP

## 2015-12-04 NOTE — Progress Notes (Signed)
Reviewed and agree with management plan.  Malcolm T. Stark, MD FACG 

## 2015-12-06 ENCOUNTER — Ambulatory Visit (HOSPITAL_COMMUNITY): Admission: RE | Admit: 2015-12-06 | Payer: Medicare Other | Source: Ambulatory Visit

## 2015-12-08 ENCOUNTER — Telehealth: Payer: Self-pay

## 2015-12-11 ENCOUNTER — Ambulatory Visit (HOSPITAL_COMMUNITY)
Admission: RE | Admit: 2015-12-11 | Discharge: 2015-12-11 | Disposition: A | Discharge status: Home / Self Care | Payer: Medicare Other | Source: Ambulatory Visit | Attending: Physician Assistant | Admitting: Physician Assistant

## 2015-12-11 ENCOUNTER — Other Ambulatory Visit: Payer: Self-pay | Admitting: *Deleted

## 2015-12-11 DIAGNOSIS — R160 Hepatomegaly, not elsewhere classified: Secondary | ICD-10-CM | POA: Insufficient documentation

## 2015-12-11 DIAGNOSIS — R1013 Epigastric pain: Secondary | ICD-10-CM | POA: Diagnosis not present

## 2015-12-11 DIAGNOSIS — R161 Splenomegaly, not elsewhere classified: Secondary | ICD-10-CM | POA: Diagnosis not present

## 2015-12-11 DIAGNOSIS — R1110 Vomiting, unspecified: Secondary | ICD-10-CM

## 2015-12-11 DIAGNOSIS — R932 Abnormal findings on diagnostic imaging of liver and biliary tract: Secondary | ICD-10-CM | POA: Diagnosis not present

## 2015-12-11 NOTE — Telephone Encounter (Signed)
Medication was refilled on 11/28/15 by Dr. Doreene Burke.

## 2015-12-12 ENCOUNTER — Other Ambulatory Visit: Payer: Self-pay | Admitting: *Deleted

## 2015-12-12 ENCOUNTER — Ambulatory Visit (AMBULATORY_SURGERY_CENTER): Payer: Medicare Other | Admitting: Gastroenterology

## 2015-12-12 ENCOUNTER — Encounter: Payer: Self-pay | Admitting: Gastroenterology

## 2015-12-12 VITALS — BP 138/88 | HR 77 | Temp 97.3°F | Resp 22 | Ht 66.0 in | Wt 212.0 lb

## 2015-12-12 DIAGNOSIS — R1013 Epigastric pain: Secondary | ICD-10-CM

## 2015-12-12 DIAGNOSIS — R112 Nausea with vomiting, unspecified: Secondary | ICD-10-CM

## 2015-12-12 DIAGNOSIS — Z1211 Encounter for screening for malignant neoplasm of colon: Secondary | ICD-10-CM | POA: Diagnosis not present

## 2015-12-12 DIAGNOSIS — D5700 Hb-SS disease with crisis, unspecified: Secondary | ICD-10-CM

## 2015-12-12 DIAGNOSIS — J45909 Unspecified asthma, uncomplicated: Secondary | ICD-10-CM | POA: Diagnosis not present

## 2015-12-12 MED ORDER — OMEPRAZOLE 20 MG PO CPDR: 20.0000 mg | DELAYED_RELEASE_CAPSULE | Freq: Two times a day (BID) | ORAL | 11 refills | Status: AC

## 2015-12-12 MED ORDER — SODIUM CHLORIDE 0.9 % IV SOLN: 500.0000 mL | INTRAVENOUS | Status: DC

## 2015-12-12 MED ORDER — OXYCODONE HCL 10 MG PO TABS: 10.0000 mg | ORAL_TABLET | ORAL | 0 refills | Status: DC | PRN

## 2015-12-12 NOTE — Telephone Encounter (Signed)
Patients medication was refilled per PCP approval.

## 2015-12-12 NOTE — Progress Notes (Signed)
A/ox3 pleased with MAC, report to [facility]

## 2015-12-12 NOTE — Progress Notes (Signed)
Dental advisory given to patient 

## 2015-12-12 NOTE — Op Note (Signed)
[facility] Patient Name: Cassie Montgomery Procedure Date: 12/12/2015 8:32 AM MRN: [id number] Endoscopist: Ladene Artist , MD Age: 45 Referring MD:  Date of Birth: [birth year] Gender: Female Account #: [id number] Procedure:                Upper GI endoscopy Indications:              Epigastric abdominal pain, Esophageal reflux                            symptoms that persist despite appropriate therapy,                            Nausea with vomiting Medicines:                Monitored Anesthesia Care Procedure:                Pre-Anesthesia Assessment:                           - Prior to the procedure, a History and Physical                            was performed, and patient medications and                            allergies were reviewed. The patient's tolerance of                            previous anesthesia was also reviewed. The risks                            and benefits of the procedure and the sedation                            options and risks were discussed with the patient.                            All questions were answered, and informed consent                            was obtained. Prior Anticoagulants: The patient has                            taken no previous anticoagulant or antiplatelet                            agents. ASA Grade Assessment: II - A patient with                            mild systemic disease. After reviewing the risks                            and benefits, the patient was deemed in  satisfactory condition to undergo the procedure.                           After obtaining informed consent, the endoscope was                            passed under direct vision. Throughout the                            procedure, the patient's blood pressure, pulse, and                            oxygen saturations were monitored continuously. The                            Model GIF-HQ190 [id number]) scope  was introduced                            through the mouth, and advanced to the second part                            of duodenum. The upper GI endoscopy was                            accomplished without difficulty. The patient                            tolerated the procedure well. Scope In: Scope Out: Findings:                 The esophagus was normal.                           The stomach was normal.                           The examined duodenum was normal.                           The cardia and gastric fundus were normal on                            retroflexion.                           A small amount of food (residue) was found in the                            gastric body and in the gastric antrum. Complications:            No immediate complications. Estimated Blood Loss:     Estimated blood loss: none. Impression:               - Normal esophagus.                           - Normal stomach.                           -  Normal examined duodenum.                           - A small amount of food (residue) in the stomach.                           - No specimens collected. Recommendation:           - Patient has a contact number available for                            emergencies. The signs and symptoms of potential                            delayed complications were discussed with the                            patient. Return to normal activities tomorrow.                            Written discharge instructions were provided to the                            patient.                           - Antireflux diet.                           - Continue present medications.                           - Prilosec (omeprazole) 20 mg PO BID.                           - Recommend GES (gastric emptying scan) at the next                            available appointment. Ladene Artist, MD 12/12/2015 8:48:39 AM This report has been signed electronically.

## 2015-12-12 NOTE — Patient Instructions (Addendum)
YOU HAD AN ENDOSCOPIC PROCEDURE TODAY AT [facility]:   Refer to the procedure report that was given to you for any specific questions about what was found during the examination.  If the procedure report does not answer your questions, please call your gastroenterologist to clarify.  If you requested that your care partner not be given the details of your procedure findings, then the procedure report has been included in a sealed envelope for you to review at your convenience later.  YOU SHOULD EXPECT: Some feelings of bloating in the abdomen. Passage of more gas than usual.  Walking can help get rid of the air that was put into your GI tract during the procedure and reduce the bloating. If you had a lower endoscopy (such as a colonoscopy or flexible sigmoidoscopy) you may notice spotting of blood in your stool or on the toilet paper. If you underwent a bowel prep for your procedure, you may not have a normal bowel movement for a few days.  Please Note:  You might notice some irritation and congestion in your nose or some drainage.  This is from the oxygen used during your procedure.  There is no need for concern and it should clear up in a day or so.  SYMPTOMS TO REPORT IMMEDIATELY:   Following upper endoscopy (EGD)  Vomiting of blood or coffee ground material  New chest pain or pain under the shoulder blades  Painful or persistently difficult swallowing  New shortness of breath  Fever of 100F or higher  Black, tarry-looking stools  For urgent or emergent issues, a gastroenterologist can be reached at any hour by calling [phone].   DIET: Your first meal following the procedure should be a small meal and then it is ok to progress to your normal diet. Heavy or fried foods are harder to digest and may make you feel nauseous or bloated.  Likewise, meals heavy in dairy and vegetables can increase bloating.  Drink plenty of fluids but you should avoid alcoholic beverages for  24 hours.  ACTIVITY:  You should plan to take it easy for the rest of today and you should NOT DRIVE or use heavy machinery until tomorrow (because of the sedation medicines used during the test).       FOLLOW UP: Our staff will call the number listed on your records the next business day following your procedure to check on you and address any questions or concerns that you may have regarding the information given to you following your procedure. If we do not reach you, we will leave a message.  However, if you are feeling well and you are not experiencing any problems, there is no need to return our call.  We will assume that you have returned to your regular daily activities without incident.  If any biopsies were taken you will be contacted by phone or by letter within the next 1-3 weeks.  Please call us at [phone] if you have not heard about the biopsies in 3 weeks.    SIGNATURES/CONFIDENTIALITY: You and/or your care partner have signed paperwork which will be entered into your electronic medical record.  These signatures attest to the fact that that the information above on your After Visit Summary has been reviewed and is understood.  Full responsibility of the confidentiality of this discharge information lies with you and/or your care-partner.    Antireflux diet given. Increase Prilosec 20 mg to twice daily. Continue present medications.YOU HAD AN  ENDOSCOPIC PROCEDURE TODAY AT [facility]:   Refer to the procedure report that was given to you for any specific questions about what was found during the examination.  If the procedure report does not answer your questions, please call your gastroenterologist to clarify.  If you requested that your care partner not be given the details of your procedure findings, then the procedure report has been included in a sealed envelope for you to review at your convenience later.  YOU SHOULD EXPECT: Some feelings of bloating in  the abdomen. Passage of more gas than usual.  Walking can help get rid of the air that was put into your GI tract during the procedure and reduce the bloating. If you had a lower endoscopy (such as a colonoscopy or flexible sigmoidoscopy) you may notice spotting of blood in your stool or on the toilet paper. If you underwent a bowel prep for your procedure, you may not have a normal bowel movement for a few days.  Please Note:  You might notice some irritation and congestion in your nose or some drainage.  This is from the oxygen used during your procedure.  There is no need for concern and it should clear up in a day or so.  SYMPTOMS TO REPORT IMMEDIATELY:   Following lower endoscopy (colonoscopy or flexible sigmoidoscopy):  Excessive amounts of blood in the stool  Significant tenderness or worsening of abdominal pains  Swelling of the abdomen that is new, acute  Fever of 100F or higher   Following upper endoscopy (EGD)  Vomiting of blood or coffee ground material  New chest pain or pain under the shoulder blades  Painful or persistently difficult swallowing  New shortness of breath  Fever of 100F or higher  Black, tarry-looking stools  For urgent or emergent issues, a gastroenterologist can be reached at any hour by calling [phone].   DIET: Your first meal following the procedure should be a small meal and then it is ok to progress to your normal diet. Heavy or fried foods are harder to digest and may make you feel nauseous or bloated.  Likewise, meals heavy in dairy and vegetables can increase bloating.  Drink plenty of fluids but you should avoid alcoholic beverages for 24 hours.  ACTIVITY:  You should plan to take it easy for the rest of today and you should NOT DRIVE or use heavy machinery until tomorrow (because of the sedation medicines used during the test).    FOLLOW UP: Our staff will call the number listed on your records the next business day following your  procedure to check on you and address any questions or concerns that you may have regarding the information given to you following your procedure. If we do not reach you, we will leave a message.  However, if you are feeling well and you are not experiencing any problems, there is no need to return our call.  We will assume that you have returned to your regular daily activities without incident.  If any biopsies were taken you will be contacted by phone or by letter within the next 1-3 weeks.  Please call us at [phone] if you have not heard about the biopsies in 3 weeks.    SIGNATURES/CONFIDENTIALITY: You and/or your care partner have signed paperwork which will be entered into your electronic medical record.  These signatures attest to the fact that that the information above on your After Visit Summary has been reviewed and is  understood.  Full responsibility of the confidentiality of this discharge information lies with you and/or your care-partner.YOU HAD AN ENDOSCOPIC PROCEDURE TODAY AT [facility]:   Refer to the procedure report that was given to you for any specific questions about what was found during the examination.  If the procedure report does not answer your questions, please call your gastroenterologist to clarify.  If you requested that your care partner not be given the details of your procedure findings, then the procedure report has been included in a sealed envelope for you to review at your convenience later.  YOU SHOULD EXPECT: Some feelings of bloating in the abdomen. Passage of more gas than usual.  Walking can help get rid of the air that was put into your GI tract during the procedure and reduce the bloating. If you had a lower endoscopy (such as a colonoscopy or flexible sigmoidoscopy) you may notice spotting of blood in your stool or on the toilet paper. If you underwent a bowel prep for your procedure, you may not have a normal bowel movement for a few  days.  Please Note:  You might notice some irritation and congestion in your nose or some drainage.  This is from the oxygen used during your procedure.  There is no need for concern and it should clear up in a day or so.  SYMPTOMS TO REPORT IMMEDIATELY:    Following upper endoscopy (EGD)  Vomiting of blood or coffee ground material  New chest pain or pain under the shoulder blades  Painful or persistently difficult swallowing  New shortness of breath  Fever of 100F or higher  Black, tarry-looking stools  For urgent or emergent issues, a gastroenterologist can be reached at any hour by calling [phone].   DIET: Your first meal following the procedure should be a small meal and then it is ok to progress to your normal diet. Heavy or fried foods are harder to digest and may make you feel nauseous or bloated.  Likewise, meals heavy in dairy and vegetables can increase bloating.  Drink plenty of fluids but you should avoid alcoholic beverages for 24 hours.  ACTIVITY:  You should plan to take it easy for the rest of today and you should NOT DRIVE or use heavy machinery until tomorrow (because of the sedation medicines used during the test).    FOLLOW UP: Our staff will call the number listed on your records the next business day following your procedure to check on you and address any questions or concerns that you may have regarding the information given to you following your procedure. If we do not reach you, we will leave a message.  However, if you are feeling well and you are not experiencing any problems, there is no need to return our call.  We will assume that you have returned to your regular daily activities without incident.  If any biopsies were taken you will be contacted by phone or by letter within the next 1-3 weeks.  Please call us at [phone] if you have not heard about the biopsies in 3 weeks.    SIGNATURES/CONFIDENTIALITY: You and/or your care partner have  signed paperwork which will be entered into your electronic medical record.  These signatures attest to the fact that that the information above on your After Visit Summary has been reviewed and is understood.  Full responsibility of the confidentiality of this discharge information lies with you and/or your care-partner.

## 2015-12-13 ENCOUNTER — Other Ambulatory Visit: Payer: Self-pay

## 2015-12-13 ENCOUNTER — Ambulatory Visit: Payer: Medicare Other | Admitting: Certified Nurse Midwife

## 2015-12-13 ENCOUNTER — Telehealth: Payer: Self-pay | Admitting: *Deleted

## 2015-12-13 DIAGNOSIS — K219 Gastro-esophageal reflux disease without esophagitis: Secondary | ICD-10-CM

## 2015-12-13 DIAGNOSIS — R112 Nausea with vomiting, unspecified: Secondary | ICD-10-CM

## 2015-12-13 NOTE — Telephone Encounter (Signed)
No answer, left message to call if questions or concerns. 

## 2015-12-14 NOTE — Progress Notes (Signed)
Patient notified of GES scheduled for 12/27/15 recommended at EGD on 12/12/15.  She is not going to be in town and wants to wait until she returns to schedule.  She will call me back once she returns to schedule.

## 2015-12-22 ENCOUNTER — Ambulatory Visit: Payer: Medicare Other | Admitting: Family Medicine

## 2015-12-25 ENCOUNTER — Telehealth: Payer: Self-pay

## 2015-12-25 NOTE — Telephone Encounter (Signed)
Refill for oxycodone 10mg . Please advise. Thanks!

## 2015-12-26 ENCOUNTER — Other Ambulatory Visit: Payer: Self-pay | Admitting: Internal Medicine

## 2015-12-26 DIAGNOSIS — D5700 Hb-SS disease with crisis, unspecified: Secondary | ICD-10-CM

## 2015-12-26 MED ORDER — OXYCODONE HCL 10 MG PO TABS: 10.0000 mg | ORAL_TABLET | ORAL | 0 refills | Status: DC | PRN

## 2015-12-27 ENCOUNTER — Encounter (HOSPITAL_COMMUNITY): Payer: Medicare Other

## 2016-01-03 ENCOUNTER — Ambulatory Visit (INDEPENDENT_AMBULATORY_CARE_PROVIDER_SITE_OTHER): Payer: Medicare Other | Admitting: Obstetrics and Gynecology

## 2016-01-03 ENCOUNTER — Encounter: Payer: Self-pay | Admitting: Obstetrics and Gynecology

## 2016-01-03 VITALS — BP 123/106 | HR 92 | Temp 98.7°F | Ht 66.0 in | Wt 202.8 lb

## 2016-01-03 DIAGNOSIS — Z0000 Encounter for general adult medical examination without abnormal findings: Secondary | ICD-10-CM

## 2016-01-03 DIAGNOSIS — Z01419 Encounter for gynecological examination (general) (routine) without abnormal findings: Secondary | ICD-10-CM | POA: Diagnosis not present

## 2016-01-03 DIAGNOSIS — N390 Urinary tract infection, site not specified: Secondary | ICD-10-CM | POA: Diagnosis not present

## 2016-01-03 DIAGNOSIS — Z36 Encounter for antenatal screening of mother: Secondary | ICD-10-CM | POA: Diagnosis not present

## 2016-01-03 DIAGNOSIS — Z1389 Encounter for screening for other disorder: Secondary | ICD-10-CM

## 2016-01-03 LAB — POCT URINALYSIS DIPSTICK
Bilirubin, UA: NEGATIVE
GLUCOSE UA: NEGATIVE
Ketones, UA: NEGATIVE
NITRITE UA: NEGATIVE
SPEC GRAV UA: 1.015
UROBILINOGEN UA: 0.2
pH, UA: 5

## 2016-01-03 NOTE — Addendum Note (Signed)
Addended by: Dorothyann Gibbs on: 01/03/2016 02:49 PM   Modules accepted: Orders

## 2016-01-03 NOTE — Progress Notes (Signed)
Subjective:     Ruperta Redler is a 45 y.o. female G3P3 with BMI 32 who is here for a comprehensive physical exam. The patient reports a 30lb weight loss over the past 2 months. She states that she lost her appetite but it has since returned. She denies trying to lose weight during that time. She is sexually active without contraception. She experiences irregular cycles often skipping months followed by a 4 day period. She denies any urinary incontinence  Past Medical History:  Diagnosis Date  . Anxiety   . Asthma in adult    probable  . Chronic pain syndrome   . Depression   . Ganglion cyst   . GERD (gastroesophageal reflux disease)   . Left arm pain    chronic  . Migraine with aura 07/15/2011  . PMDD (premenstrual dysphoric disorder)   . Sickle cell anemia (HCC)   . Sickle cell disease without crisis ([place]) 07/15/2011   Past Surgical History:  Procedure Laterality Date  . CESAREAN SECTION     2   Family History  Problem Relation Age of Onset  . Hypertension Mother   . Asthma Son   . Asthma Son   . Asthma Daughter    Social History   Social History  . Marital status: Single    Spouse name: N/A  . Number of children: 3  . Years of education: N/A   Occupational History  . Not on file.   Social History Main Topics  . Smoking status: Never Smoker  . Smokeless tobacco: Never Used  . Alcohol use No  . Drug use: No  . Sexual activity: Yes   Other Topics Concern  . Not on file   Social History Narrative   Uses a cane occasionally.  Does not work.     Health Maintenance  Topic Date Due  . FOOT EXAM  11/25/1979  . OPHTHALMOLOGY EXAM  11/25/1979  . HEMOGLOBIN A1C  05/27/2015  . URINE MICROALBUMIN  10/09/2016  . PAP SMEAR  11/23/2017  . PNEUMOCOCCAL POLYSACCHARIDE VACCINE (2) 06/11/2018  . TETANUS/TDAP  06/12/2023  . HIV Screening  Completed       Review of Systems Pertinent items are noted in HPI.   Objective:      GENERAL: Well-developed, well-nourished  female in no acute distress.  HEENT: Normocephalic, atraumatic. Sclerae anicteric.  NECK: Supple. Normal thyroid.  LUNGS: Clear to auscultation bilaterally.  HEART: Regular rate and rhythm. BREASTS: Symmetric in size. No palpable masses or lymphadenopathy, skin changes, or nipple drainage. ABDOMEN: Soft, nontender, nondistended. No organomegaly. PELVIC: Normal external female genitalia. Vagina is pink and rugated.  Normal discharge. Normal appearing cervix. Uterus is normal in size. No adnexal mass or tenderness. EXTREMITIES: No cyanosis, clubbing, or edema, 2+ distal pulses.    Assessment:    Healthy female exam.      Plan:    Pap smear performed Patient is scheduled for her mammogram this month Patient will be contacted with any abnormal results See After Visit Summary for Counseling Recommendations

## 2016-01-05 LAB — PAP IG W/ RFLX HPV ASCU: PAP SMEAR COMMENT: 0

## 2016-01-06 LAB — CULTURE, URINE COMPREHENSIVE

## 2016-01-07 ENCOUNTER — Other Ambulatory Visit: Payer: Self-pay | Admitting: Obstetrics and Gynecology

## 2016-01-07 LAB — NUSWAB VG+, CANDIDA 6SP
Atopobium vaginae: HIGH Score — AB
BVAB 2: HIGH {score} — AB
CANDIDA ALBICANS, NAA: NEGATIVE
CHLAMYDIA TRACHOMATIS, NAA: NEGATIVE
Candida glabrata, NAA: NEGATIVE
Candida krusei, NAA: NEGATIVE
Candida lusitaniae, NAA: NEGATIVE
Candida parapsilosis, NAA: NEGATIVE
Candida tropicalis, NAA: NEGATIVE
MEGASPHAERA 1: HIGH {score} — AB
NEISSERIA GONORRHOEAE, NAA: NEGATIVE
TRICH VAG BY NAA: NEGATIVE

## 2016-01-07 MED ORDER — METRONIDAZOLE 500 MG PO TABS: 500.0000 mg | ORAL_TABLET | Freq: Two times a day (BID) | ORAL | 0 refills | Status: DC

## 2016-01-09 ENCOUNTER — Other Ambulatory Visit: Payer: Self-pay | Admitting: Internal Medicine

## 2016-01-09 ENCOUNTER — Telehealth: Payer: Self-pay | Admitting: *Deleted

## 2016-01-09 ENCOUNTER — Telehealth: Payer: Self-pay

## 2016-01-09 DIAGNOSIS — D5700 Hb-SS disease with crisis, unspecified: Secondary | ICD-10-CM

## 2016-01-09 MED ORDER — OXYCODONE HCL 10 MG PO TABS: 10.0000 mg | ORAL_TABLET | ORAL | 0 refills | Status: AC | PRN

## 2016-01-09 NOTE — Telephone Encounter (Signed)
Pt made aware of pap and Nuswab results.  Pt made aware that provider has sent Rx to her pharmacy.

## 2016-01-16 ENCOUNTER — Ambulatory Visit: Payer: Medicare Other

## 2016-01-19 ENCOUNTER — Ambulatory Visit: Payer: Medicare Other | Admitting: Family Medicine

## 2016-01-23 ENCOUNTER — Telehealth: Payer: Self-pay

## 2016-01-23 NOTE — Telephone Encounter (Signed)
Refill on oxycodone. Please advise. Thanks!

## 2016-01-24 ENCOUNTER — Other Ambulatory Visit: Payer: Self-pay | Admitting: Family Medicine

## 2016-01-24 ENCOUNTER — Telehealth: Payer: Self-pay | Admitting: Gastroenterology

## 2016-01-24 ENCOUNTER — Other Ambulatory Visit: Payer: Self-pay

## 2016-01-24 DIAGNOSIS — R112 Nausea with vomiting, unspecified: Secondary | ICD-10-CM

## 2016-01-24 NOTE — Telephone Encounter (Signed)
Patient was to have a GES after EGD, but she declined to schedule because she was going out of town.  She reports continued reflux.  I advised need to schedule GES.  She is rescheduled for 02/13/16 7:30.  She is advised to be NPO after midnight and hold stomach meds for 24 hours prior.

## 2016-01-26 ENCOUNTER — Other Ambulatory Visit: Payer: Self-pay | Admitting: Family Medicine

## 2016-01-26 MED ORDER — OXYCODONE HCL 10 MG PO TABS: 10.0000 mg | ORAL_TABLET | ORAL | 0 refills | Status: DC

## 2016-01-26 MED ORDER — OXYCODONE HCL 5 MG PO TABS: 10.0000 mg | ORAL_TABLET | ORAL | Status: DC | PRN

## 2016-01-30 ENCOUNTER — Other Ambulatory Visit: Payer: Self-pay | Admitting: Obstetrics and Gynecology

## 2016-02-01 ENCOUNTER — Emergency Department (HOSPITAL_COMMUNITY)
Admission: EM | Admit: 2016-02-01 | Discharge: 2016-02-02 | Disposition: A | Discharge status: Home / Self Care | Payer: Medicare Other | Attending: Emergency Medicine | Admitting: Emergency Medicine

## 2016-02-01 ENCOUNTER — Encounter (HOSPITAL_COMMUNITY): Payer: Self-pay | Admitting: Emergency Medicine

## 2016-02-01 DIAGNOSIS — Z79899 Other long term (current) drug therapy: Secondary | ICD-10-CM | POA: Insufficient documentation

## 2016-02-01 DIAGNOSIS — J45909 Unspecified asthma, uncomplicated: Secondary | ICD-10-CM | POA: Diagnosis not present

## 2016-02-01 DIAGNOSIS — Z7951 Long term (current) use of inhaled steroids: Secondary | ICD-10-CM | POA: Diagnosis not present

## 2016-02-01 DIAGNOSIS — R1010 Upper abdominal pain, unspecified: Secondary | ICD-10-CM

## 2016-02-01 DIAGNOSIS — Z791 Long term (current) use of non-steroidal anti-inflammatories (NSAID): Secondary | ICD-10-CM | POA: Diagnosis not present

## 2016-02-01 DIAGNOSIS — Z91040 Latex allergy status: Secondary | ICD-10-CM | POA: Diagnosis not present

## 2016-02-01 DIAGNOSIS — D5700 Hb-SS disease with crisis, unspecified: Secondary | ICD-10-CM

## 2016-02-01 DIAGNOSIS — R1084 Generalized abdominal pain: Secondary | ICD-10-CM | POA: Diagnosis not present

## 2016-02-01 LAB — CBC WITH DIFFERENTIAL/PLATELET
BASOS ABS: 0 10*3/uL (ref 0.0–0.1)
BASOS PCT: 0 %
EOS ABS: 0 10*3/uL (ref 0.0–0.7)
EOS PCT: 0 %
HCT: 32.5 % — ABNORMAL LOW (ref 36.0–46.0)
Hemoglobin: 11.8 g/dL — ABNORMAL LOW (ref 12.0–15.0)
Lymphocytes Relative: 9 %
Lymphs Abs: 1 10*3/uL (ref 0.7–4.0)
MCH: 25.4 pg — ABNORMAL LOW (ref 26.0–34.0)
MCHC: 36.3 g/dL — ABNORMAL HIGH (ref 30.0–36.0)
MCV: 70 fL — ABNORMAL LOW (ref 78.0–100.0)
Monocytes Absolute: 0.1 10*3/uL (ref 0.1–1.0)
Monocytes Relative: 1 %
NEUTROS ABS: 10.2 10*3/uL — AB (ref 1.7–7.7)
NEUTROS PCT: 90 %
PLATELETS: 132 10*3/uL — AB (ref 150–400)
RBC: 4.64 MIL/uL (ref 3.87–5.11)
RDW: 17.3 % — ABNORMAL HIGH (ref 11.5–15.5)
WBC: 11.3 10*3/uL — ABNORMAL HIGH (ref 4.0–10.5)

## 2016-02-01 LAB — RETICULOCYTES
RBC.: 4.66 MIL/uL (ref 3.87–5.11)
RETIC COUNT ABSOLUTE: 223.7 10*3/uL — AB (ref 19.0–186.0)
Retic Ct Pct: 4.8 % — ABNORMAL HIGH (ref 0.4–3.1)

## 2016-02-01 LAB — COMPREHENSIVE METABOLIC PANEL
ALT: 11 U/L — ABNORMAL LOW (ref 14–54)
ANION GAP: 8 (ref 5–15)
AST: 16 U/L (ref 15–41)
Albumin: 4.9 g/dL (ref 3.5–5.0)
Alkaline Phosphatase: 59 U/L (ref 38–126)
BUN: 13 mg/dL (ref 6–20)
CALCIUM: 9.8 mg/dL (ref 8.9–10.3)
CHLORIDE: 106 mmol/L (ref 101–111)
CO2: 26 mmol/L (ref 22–32)
Creatinine, Ser: 0.77 mg/dL (ref 0.44–1.00)
GFR calc non Af Amer: >60 mL/min (ref >60)
Glucose, Bld: 147 mg/dL — ABNORMAL HIGH (ref 65–99)
Potassium: 3.2 mmol/L — ABNORMAL LOW (ref 3.5–5.1)
SODIUM: 140 mmol/L (ref 135–145)
Total Bilirubin: 2.2 mg/dL — ABNORMAL HIGH (ref 0.3–1.2)
Total Protein: 8.2 g/dL — ABNORMAL HIGH (ref 6.5–8.1)

## 2016-02-01 LAB — LIPASE, BLOOD: LIPASE: 13 U/L (ref 11–51)

## 2016-02-01 MED ORDER — HYDROMORPHONE HCL 2 MG/ML IJ SOLN
0.0313 mg/kg | INTRAMUSCULAR | Status: AC
  Administered 2016-02-01: 2.9 mg via INTRAVENOUS
  Filled 2016-02-01: qty 2

## 2016-02-01 MED ORDER — HYDROMORPHONE HCL 2 MG/ML IJ SOLN
0.0250 mg/kg | INTRAMUSCULAR | Status: AC
  Administered 2016-02-01: 1.8 mg via INTRAVENOUS
  Filled 2016-02-01: qty 1

## 2016-02-01 MED ORDER — ONDANSETRON HCL 4 MG/2ML IJ SOLN
4.0000 mg | Freq: Once | INTRAMUSCULAR | Status: AC
  Administered 2016-02-01: 4 mg via INTRAVENOUS
  Filled 2016-02-01: qty 2

## 2016-02-01 MED ORDER — PROMETHAZINE HCL 25 MG PO TABS
25.0000 mg | ORAL_TABLET | Freq: Once | ORAL | Status: AC
  Administered 2016-02-01: 25 mg via ORAL
  Filled 2016-02-01: qty 1

## 2016-02-01 MED ORDER — HYDROMORPHONE HCL 2 MG/ML IJ SOLN: 0.0250 mg/kg | INTRAMUSCULAR | Status: AC

## 2016-02-01 MED ORDER — DEXTROSE-NACL 5-0.45 % IV SOLN
INTRAVENOUS | Status: DC
  Administered 2016-02-01: 22:00:00 via INTRAVENOUS

## 2016-02-01 MED ORDER — SUCRALFATE 1 G PO TABS
1.0000 g | ORAL_TABLET | Freq: Once | ORAL | Status: AC
  Administered 2016-02-01: 1 g via ORAL
  Filled 2016-02-01: qty 1

## 2016-02-01 MED ORDER — HYDROMORPHONE HCL 2 MG/ML IJ SOLN: 0.0313 mg/kg | INTRAMUSCULAR | Status: AC

## 2016-02-01 MED ORDER — HYDROMORPHONE HCL 2 MG/ML IJ SOLN: 0.0375 mg/kg | INTRAMUSCULAR | Status: AC

## 2016-02-01 MED ORDER — HYDROMORPHONE HCL 2 MG/ML IJ SOLN
0.0375 mg/kg | INTRAMUSCULAR | Status: AC
  Administered 2016-02-02: 3.5 mg via INTRAVENOUS
  Filled 2016-02-01: qty 2

## 2016-02-01 MED ORDER — GI COCKTAIL ~~LOC~~
30.0000 mL | Freq: Once | ORAL | Status: AC
  Administered 2016-02-01: 30 mL via ORAL
  Filled 2016-02-01: qty 30

## 2016-02-01 MED ORDER — FAMOTIDINE IN NACL 20-0.9 MG/50ML-% IV SOLN
20.0000 mg | Freq: Once | INTRAVENOUS | Status: AC
  Administered 2016-02-01: 20 mg via INTRAVENOUS
  Filled 2016-02-01: qty 50

## 2016-02-01 MED ORDER — DIPHENHYDRAMINE HCL 50 MG/ML IJ SOLN
25.0000 mg | Freq: Once | INTRAMUSCULAR | Status: AC
  Administered 2016-02-01: 25 mg via INTRAVENOUS
  Filled 2016-02-01: qty 1

## 2016-02-01 MED ORDER — POTASSIUM CHLORIDE CRYS ER 20 MEQ PO TBCR
60.0000 meq | EXTENDED_RELEASE_TABLET | Freq: Once | ORAL | Status: AC
  Administered 2016-02-01: 60 meq via ORAL
  Filled 2016-02-01: qty 3

## 2016-02-01 NOTE — ED Provider Notes (Signed)
[facility] DEPT Provider Note   CSN: [id number] Arrival date & time: 02/01/16  2040  History   Chief Complaint Chief Complaint  Patient presents with  . Sickle Cell Pain Crisis   HPI  Cassie Montgomery is an 45 y.o. female with history of sickle cell anemia, chronic pain, GERD, who presents to the ED for evaluation of generalized pain consistent with sickle cell crisis as well as abdominal pain, nausea, and vomiting. She states she typically takes carafate and prilosec for her abdominal pain. States she ran out of her carafate at home. Today started feeling nauseated and generalize abdominal pain. States she has had at least 10 episodes of NBNB emesis today. She states consequently she was unable to keep down her home oxycodone that she takes for her chronic pain. States now she has pain in her shoulders, back, hips, and legs that is her typical sickle cell pain. She states she follows with Dr. Fuller Plan of GI and had a normal EGD but is scheduled to have a gastric emptying study in October for possible delayed gastric emptying. Sees L.Hollis for her sickle cell care. Denies fever or chills. Denies diarrhea. Denies dysuria, increase urinary frequency or urgency.  Past Medical History:  Diagnosis Date  . Anxiety   . Asthma in adult    probable  . Chronic pain syndrome   . Depression   . Ganglion cyst   . GERD (gastroesophageal reflux disease)   . Left arm pain    chronic  . Migraine with aura 07/15/2011  . PMDD (premenstrual dysphoric disorder)   . Sickle cell anemia (HCC)   . Sickle cell disease without crisis ([place]) 07/15/2011    Patient Active Problem List   Diagnosis Date Noted  . Periorbital edema 10/10/2015  . Dyslipidemia 10/10/2015  . Chronic left shoulder pain 09/06/2015  . Sickle cell anemia with pain ([place]) 02/02/2015  . Cephalalgia 10/26/2014  . Medicare annual wellness visit, initial 07/27/2014  . Vitamin D deficiency 06/28/2014  . GERD (gastroesophageal reflux disease)  06/12/2014  . Asthma in adult   . Cholecystitis, acute 06/10/2014  . Chronic prescription opiate use 01/12/2014  . Depression, major ([place]) 10/07/2013  . PTSD (post-traumatic stress disorder) 10/07/2013  . Weight gain 09/27/2013  . Irregular menstrual cycle 05/12/2013  . Panic anxiety syndrome 05/11/2013  . Insomnia 05/11/2013  . Pap smear for cervical cancer screening 05/11/2013  . Chronic pain syndrome 01/06/2013  . Depressive disorder, not elsewhere classified 10/20/2012  . Generalized anxiety disorder 10/20/2012  . Opiate analgesic contract exists 09/11/2012  . Anemia 12/07/2011    Past Surgical History:  Procedure Laterality Date  . CESAREAN SECTION     2    OB History    Gravida Para Term Preterm AB Living   3         3   SAB TAB Ectopic Multiple Live Births           3       Home Medications    Prior to Admission medications   Medication Sig Start Date End Date Taking? Authorizing Provider  acetaminophen (TYLENOL) 500 MG tablet Take 500 mg by mouth every 6 (six) hours as needed for fever.   Yes Historical Provider, MD  albuterol (PROVENTIL HFA;VENTOLIN HFA) 108 (90 BASE) MCG/ACT inhaler Inhale 2 puffs into the lungs every 6 (six) hours as needed for wheezing or shortness of breath (wheezing). 04/17/15  Yes Dorena Dew, FNP  Artificial Tear Ointment (DRY EYES OP) Apply 2  drops to eye daily as needed (dry eyes).   Yes Historical Provider, MD  diphenhydrAMINE (BENADRYL) 25 MG tablet Take 1-2 tablets (25-50 mg total) by mouth at bedtime as needed for sleep. 10/10/15  Yes Tresa Garter, MD  fluticasone (FLONASE) 50 MCG/ACT nasal spray Place 2 sprays into both nostrils daily as needed for allergies or rhinitis (allergies).    Yes Historical Provider, MD  folic acid (FOLVITE) 1 MG tablet Take 1 tablet (1 mg total) by mouth every morning. 05/26/14  Yes [name], MD  ibuprofen (ADVIL,MOTRIN) 600 MG tablet Take 1 tablet (600 mg total) by mouth every 8 (eight)  hours as needed for moderate pain. 11/30/14  Yes Micheline Chapman, NP  metroNIDAZOLE (FLAGYL) 500 MG tablet TAKE 1 TABLET BY MOUTH TWICE A DAY Patient taking differently: TAKE 500 MG BY MOUTH TWICE A DAY 01/31/16  Yes Peggy Constant, MD  omeprazole (PRILOSEC) 20 MG capsule Take 1 capsule (20 mg total) by mouth 2 (two) times daily before a meal. 12/12/15  Yes Ladene Artist, MD  ondansetron (ZOFRAN) 4 MG tablet Take 1 tablet (4 mg total) by mouth every 6 (six) hours. Patient taking differently: Take 4 mg by mouth every 6 (six) hours as needed for nausea or vomiting.  12/04/15  Yes Amy S Esterwood, PA-C  Oxycodone HCl 10 MG TABS Take 1 tablet (10 mg total) by mouth every 4 (four) hours. 01/26/16  Yes Micheline Chapman, NP    Family History Family History  Problem Relation Age of Onset  . Hypertension Mother   . Asthma Son   . Asthma Son   . Asthma Daughter     Social History Social History  Substance Use Topics  . Smoking status: Never Smoker  . Smokeless tobacco: Never Used  . Alcohol use No     Allergies   Hydrea [hydroxyurea]; Latex; Sulfa antibiotics; and Adhesive [tape]   Review of Systems Review of Systems  All other systems reviewed and are negative.    Physical Exam Updated Vital Signs BP 154/94 (BP Location: Right Arm)   Pulse 64   Temp 98.3 F (36.8 C) (Oral)   Resp 12   Wt 92 kg   LMP 12/16/2015 (Exact Date)   SpO2 100%   BMI 32.74 kg/m   Physical Exam  Constitutional: She is oriented to person, place, and time.  Appears uncomfortable. Tearful.  HENT:  Right Ear: External ear normal.  Left Ear: External ear normal.  Nose: Nose normal.  Mouth/Throat: Oropharynx is clear and moist. No oropharyngeal exudate.  Eyes: Conjunctivae are normal.  Neck: Neck supple.  Cardiovascular: Normal rate, regular rhythm, normal heart sounds and intact distal pulses.   Pulmonary/Chest: Effort normal and breath sounds normal. No respiratory distress. She has no wheezes.  She exhibits no tenderness.  Abdominal: Soft. Bowel sounds are normal. She exhibits no distension. There is no rebound and no guarding.  Abdomen diffusely tender without rebound or guarding. No CVA tenderness  Musculoskeletal: She exhibits no edema.  Mild diffuse bilateral paraspinal tenderness. No hip or LE tenderness. Moves all extremities freely.  Lymphadenopathy:    She has no cervical adenopathy.  Neurological: She is alert and oriented to person, place, and time. No cranial nerve deficit.  Skin: Skin is warm and dry.  Psychiatric: She has a normal mood and affect.  Nursing note and vitals reviewed.    ED Treatments / Results  Labs (all labs ordered are listed, but only abnormal results are  displayed) Labs Reviewed  COMPREHENSIVE METABOLIC PANEL - Abnormal; Notable for the following:       Result Value   Potassium 3.2 (*)    Glucose, Bld 147 (*)    Total Protein 8.2 (*)    ALT 11 (*)    Total Bilirubin 2.2 (*)    All other components within normal limits  CBC WITH DIFFERENTIAL/PLATELET - Abnormal; Notable for the following:    WBC 11.3 (*)    Hemoglobin 11.8 (*)    HCT 32.5 (*)    MCV 70.0 (*)    MCH 25.4 (*)    MCHC 36.3 (*)    RDW 17.3 (*)    Platelets 132 (*)    Neutro Abs 10.2 (*)    All other components within normal limits  RETICULOCYTES - Abnormal; Notable for the following:    Retic Ct Pct 4.8 (*)    Retic Count, Manual 223.7 (*)    All other components within normal limits  LIPASE, BLOOD  URINALYSIS, ROUTINE W REFLEX MICROSCOPIC (NOT AT [facility])  POC URINE PREG, ED    EKG  EKG Interpretation None       Radiology No results found.  Procedures Procedures (including critical care time)  Medications Ordered in ED Medications  dextrose 5 %-0.45 % sodium chloride infusion ( Intravenous New Bag/Given 02/01/16 2214)  dicyclomine (BENTYL) capsule 10 mg (not administered)  metoCLOPramide (REGLAN) injection 10 mg (not administered)  ondansetron (ZOFRAN)  injection 4 mg (4 mg Intravenous Given 02/01/16 2214)  HYDROmorphone (DILAUDID) injection 1.8 mg (1.8 mg Intravenous Given 02/01/16 2214)    Or  HYDROmorphone (DILAUDID) injection 1.8 mg ( Subcutaneous See Alternative 02/01/16 2214)  diphenhydrAMINE (BENADRYL) injection 25 mg (25 mg Intravenous Given 02/01/16 2214)  HYDROmorphone (DILAUDID) injection 2.9 mg (2.9 mg Intravenous Given 02/01/16 2318)    Or  HYDROmorphone (DILAUDID) injection 2.9 mg ( Subcutaneous See Alternative 02/01/16 2318)  promethazine (PHENERGAN) tablet 25 mg (25 mg Oral Given 02/01/16 2316)  famotidine (PEPCID) IVPB 20 mg premix (0 mg Intravenous Stopped 02/02/16 0006)  potassium chloride SA (K-DUR,KLOR-CON) CR tablet 60 mEq (60 mEq Oral Given 02/01/16 2345)  sucralfate (CARAFATE) tablet 1 g (1 g Oral Given 02/01/16 2345)  HYDROmorphone (DILAUDID) injection 3.5 mg (3.5 mg Intravenous Given 02/02/16 0010)    Or  HYDROmorphone (DILAUDID) injection 3.5 mg ( Subcutaneous See Alternative 02/02/16 0010)  gi cocktail (Maalox,Lidocaine,Donnatal) (30 mLs Oral Given 02/01/16 2348)     Initial Impression / Assessment and Plan / ED Course  I have reviewed the triage vital signs and the nursing notes.  Pertinent labs & imaging results that were available during my care of the patient were reviewed by me and considered in my medical decision making (see chart for details).  Clinical Course   12:38 AM Pain vastly improved after 3rd dose of dilaudid. However, pt states she still has persistent epigastric and LUQ pain. She still feels a bit nauseated. She states she has had no relief in her abdominal pain. I discussed that her pain could likely be coming from her possible delayed gastric emptying and the gastric emptying study will help provide more clarity. She states she wants a CT scan tonight since the pain is not better. She doe remain quite tender in her epigastrum and LUQ. Labs largely at baseline. Will add dose of reglan and bentyl.  However, as she has not had a CT abd/pelvis since 2010 (only abdominal US July of this year), will add CT abd/pelvis with  contrast.  1:00 AM At end of shift CT abd/pelvis is still pending. Will sign out to Junius Creamer, NP. If CT negative for acute findings can d/c home with instructions for close GI and PCP follow up. Will plan to give rx for reglan and carafate.   Final Clinical Impressions(s) / ED Diagnoses   Final diagnoses:  None    New Prescriptions New Prescriptions   No medications on file     Anne Ng, PA-C 02/02/16 [place], DO 02/02/16 0101

## 2016-02-01 NOTE — ED Triage Notes (Signed)
Pt from home with complaints of sickle cell related pain. Pt also has complaints of emesis more than ten times today. Pt is unable to localize her pain. Pt rate sit as a 10/10. Pt states she also has fatty liver disease

## 2016-02-02 ENCOUNTER — Emergency Department (HOSPITAL_COMMUNITY): Payer: Medicare Other

## 2016-02-02 ENCOUNTER — Encounter (HOSPITAL_COMMUNITY): Payer: Self-pay

## 2016-02-02 DIAGNOSIS — D5700 Hb-SS disease with crisis, unspecified: Secondary | ICD-10-CM | POA: Diagnosis not present

## 2016-02-02 DIAGNOSIS — R1084 Generalized abdominal pain: Secondary | ICD-10-CM | POA: Diagnosis not present

## 2016-02-02 LAB — URINALYSIS, ROUTINE W REFLEX MICROSCOPIC
BILIRUBIN URINE: NEGATIVE
Glucose, UA: NEGATIVE mg/dL
Hgb urine dipstick: NEGATIVE
KETONES UR: NEGATIVE mg/dL
NITRITE: NEGATIVE
PROTEIN: 100 mg/dL — AB
Specific Gravity, Urine: 1.016 (ref 1.005–1.030)
pH: 7 (ref 5.0–8.0)

## 2016-02-02 LAB — URINE MICROSCOPIC-ADD ON: RBC / HPF: NONE SEEN RBC/hpf (ref 0–5)

## 2016-02-02 LAB — POC URINE PREG, ED: PREG TEST UR: NEGATIVE

## 2016-02-02 MED ORDER — IOPAMIDOL (ISOVUE-300) INJECTION 61%
100.0000 mL | Freq: Once | INTRAVENOUS | Status: AC | PRN
  Administered 2016-02-02: 100 mL via INTRAVENOUS

## 2016-02-02 MED ORDER — METOCLOPRAMIDE HCL 5 MG/ML IJ SOLN
10.0000 mg | Freq: Once | INTRAMUSCULAR | Status: AC
  Administered 2016-02-02: 10 mg via INTRAVENOUS
  Filled 2016-02-02: qty 2

## 2016-02-02 MED ORDER — METOCLOPRAMIDE HCL 10 MG PO TABS: 10.0000 mg | ORAL_TABLET | Freq: Four times a day (QID) | ORAL | 0 refills | Status: DC | PRN

## 2016-02-02 MED ORDER — SUCRALFATE 1 G PO TABS: 1.0000 g | ORAL_TABLET | Freq: Three times a day (TID) | ORAL | 0 refills | Status: DC

## 2016-02-02 MED ORDER — DICYCLOMINE HCL 10 MG PO CAPS
10.0000 mg | ORAL_CAPSULE | Freq: Once | ORAL | Status: AC
  Administered 2016-02-02: 10 mg via ORAL
  Filled 2016-02-02: qty 1

## 2016-02-02 NOTE — Discharge Instructions (Addendum)
Please follow up with Dr. Fuller Plan of GI as scheduled for your gastric emptying study. Also follow up with your primary care provider. I gave you a couple prescriptions to help with your abdominal pain and nausea in the meantime. The CT scan reveals no pathology to explain your abdominal symptoms.  Please continue your pursuit for a definitive diagnosis through Dr. Fuller Plan of GI

## 2016-02-02 NOTE — ED Provider Notes (Signed)
I was asked to review CT scan results which are normal.  If this was normal.  Patient is to pursue her appointment with Dr. Fuller Plan of GI for gastric emptying study.  Preparations for discharge were made prior to my review of the CT scan results were discussed with patient   Junius Creamer, NP 02/02/16 [place], DO 02/05/16 [phone]

## 2016-02-07 ENCOUNTER — Telehealth: Payer: Self-pay

## 2016-02-08 ENCOUNTER — Other Ambulatory Visit: Payer: Self-pay | Admitting: Internal Medicine

## 2016-02-08 MED ORDER — OXYCODONE HCL 10 MG PO TABS: 10.0000 mg | ORAL_TABLET | ORAL | 0 refills | Status: DC

## 2016-02-13 ENCOUNTER — Encounter (HOSPITAL_COMMUNITY)
Admission: RE | Admit: 2016-02-13 | Discharge: 2016-02-13 | Disposition: A | Discharge status: Home / Self Care | Payer: Medicare Other | Source: Ambulatory Visit | Attending: Gastroenterology | Admitting: Gastroenterology

## 2016-02-13 DIAGNOSIS — R112 Nausea with vomiting, unspecified: Secondary | ICD-10-CM | POA: Diagnosis not present

## 2016-02-13 DIAGNOSIS — R140 Abdominal distension (gaseous): Secondary | ICD-10-CM | POA: Diagnosis not present

## 2016-02-13 MED ORDER — TECHNETIUM TC 99M SULFUR COLLOID: 2.0000 | Freq: Once | INTRAVENOUS | Status: DC | PRN

## 2016-02-14 ENCOUNTER — Telehealth: Payer: Self-pay | Admitting: Gastroenterology

## 2016-02-14 NOTE — Telephone Encounter (Signed)
Patient notified of the results.  She reports she is still having nausea and vomiting.  She will come in on 02/28/16 9:00 to see Dr. Fuller Plan

## 2016-02-16 ENCOUNTER — Ambulatory Visit: Payer: Medicare Other | Admitting: Family Medicine

## 2016-02-19 ENCOUNTER — Ambulatory Visit
Admission: RE | Admit: 2016-02-19 | Discharge: 2016-02-19 | Disposition: A | Discharge status: Home / Self Care | Payer: Medicare Other | Source: Ambulatory Visit | Attending: Internal Medicine | Admitting: Internal Medicine

## 2016-02-19 DIAGNOSIS — Z1231 Encounter for screening mammogram for malignant neoplasm of breast: Secondary | ICD-10-CM

## 2016-02-21 ENCOUNTER — Telehealth: Payer: Self-pay

## 2016-02-21 NOTE — Telephone Encounter (Signed)
Refill request for oxycodone. Please advise. Thanks!  

## 2016-02-23 ENCOUNTER — Other Ambulatory Visit: Payer: Self-pay | Admitting: Family Medicine

## 2016-02-23 ENCOUNTER — Other Ambulatory Visit: Payer: Self-pay | Admitting: Internal Medicine

## 2016-02-23 MED ORDER — OXYCODONE HCL 10 MG PO TABS: 10.0000 mg | ORAL_TABLET | ORAL | 0 refills | Status: DC

## 2016-02-27 ENCOUNTER — Telehealth: Payer: Self-pay | Admitting: *Deleted

## 2016-02-27 NOTE — Telephone Encounter (Signed)
Patient verified DOB Patient is aware of mammogram screening showing no evidence of malignancy. Patient is advised to have a repeat completed in one year. Patient expressed her understanding and had no further questions at this time.

## 2016-02-27 NOTE — Telephone Encounter (Signed)
-----   Message from Tresa Garter, MD sent at 02/25/2016  3:37 PM EDT ----- Please inform patient that her screening mammogram shows no evidence of malignancy. Recommend screening mammogram in one year

## 2016-02-28 ENCOUNTER — Ambulatory Visit (INDEPENDENT_AMBULATORY_CARE_PROVIDER_SITE_OTHER): Payer: Medicare Other | Admitting: Gastroenterology

## 2016-02-28 ENCOUNTER — Encounter: Payer: Self-pay | Admitting: Gastroenterology

## 2016-02-28 VITALS — BP 116/74 | HR 76 | Ht 67.0 in | Wt 191.2 lb

## 2016-02-28 DIAGNOSIS — R112 Nausea with vomiting, unspecified: Secondary | ICD-10-CM

## 2016-02-28 DIAGNOSIS — R634 Abnormal weight loss: Secondary | ICD-10-CM

## 2016-02-28 DIAGNOSIS — R1084 Generalized abdominal pain: Secondary | ICD-10-CM

## 2016-02-28 MED ORDER — DICYCLOMINE HCL 10 MG PO CAPS: 10.0000 mg | ORAL_CAPSULE | Freq: Three times a day (TID) | ORAL | 5 refills | Status: DC

## 2016-02-28 NOTE — Patient Instructions (Signed)
Discontinue your metoclopramide and carafate.  Change taking your ondansantron to one tablet by mouth every 6 hours daily.   We have sent the following medications to your pharmacy for you to pick up at your convenience:bentyl.  Please follow up with your primary care physician.   You have been scheduled for a CT scan of the head at [facility] ([address]---this is in the same building as [facility]).   You are scheduled on 03/06/16 at 1:30pm. You should arrive 15 minutes prior to your appointment time for registration. Please follow the written instructions below on the day of your exam:  WARNING: IF YOU ARE ALLERGIC TO IODINE/X-RAY DYE, PLEASE NOTIFY RADIOLOGY IMMEDIATELY AT [phone]! YOU WILL BE GIVEN A 13 HOUR PREMEDICATION PREP.   Do not eat or drink anything after 11:30am (4 hours prior to your test)  The purpose of you drinking the oral contrast is to aid in the visualization of your intestinal tract. The contrast solution may cause some diarrhea. Before your exam is started, you will be given a small amount of fluid to drink. Depending on your individual set of symptoms, you may also receive an intravenous injection of x-ray contrast/dye. Plan on being at [facility] for 30 minutes or longer, depending on the type of exam you are having performed.  This test typically takes 30-45 minutes to complete.  If you have any questions regarding your exam or if you need to reschedule, you may call the CT department at [phone] between the hours of 8:00 am and 5:00 pm, Monday-Friday.  ________________________________________________________________________  Thank you for choosing me and [facility].  Pricilla Riffle. Dagoberto Ligas., MD., Marval Regal

## 2016-02-28 NOTE — Progress Notes (Signed)
    History of Present Illness: This is a 45 year old female returning for follow-up of abdominal pain, nausea, vomiting and weight loss. She has had an extensive gastrointestinal evaluation over the past few months as outlined below. No GI cause of her symptoms has been identified. She is having regular formed bowel movements.   EGD unremarkable Abd/pelvic CT hepatic hemangioma, mild splenomegaly  GES normal abd US hepatic hemangioma, fatty liver  Current Medications, Allergies, Past Medical History, Past Surgical History, Family History and Social History were reviewed in [employer] record.  Physical Exam: General: Well developed, well nourished, no acute distress Head: Normocephalic and atraumatic Eyes:  sclerae anicteric, EOMI Ears: Normal auditory acuity Mouth: No deformity or lesions Lungs: Clear throughout to auscultation Heart: Regular rate and rhythm; no murmurs, rubs or bruits Abdomen: Soft, mild generalized tenderness and non distended. No masses, hepatosplenomegaly or hernias noted. Normal Bowel sounds Musculoskeletal: Symmetrical with no gross deformities  Pulses:  Normal pulses noted Extremities: No clubbing, cyanosis, edema or deformities noted Neurological: Alert oriented x 4, grossly nonfocal Psychological:  Alert and cooperative. Depressed affect, tearful  Assessment and Recommendations:  1. N/V, wt loss, generalized abdominal pain. No gastrointestinal cause has been identified. Schedule head CT to evaluate for centrally mediated cause.  DC Reglan as it is not providing any benefit.  Discontinue Carafate as it is not providing any benefit.   Continue omeprazole 20 mg twice a day.  Change Zofran to 4 mg q6h (not prn) for the next 2 weeks and then may change to prn or continue RTC use if it is helpful.  Start dicyclomine 10 mg qid for abdominal pain. If not helpful then DC.  I've recommended that she return to her PCP to evaluate for  nongastrointestinal causes of her symptoms: possibly anxiety, depression, hypokalemia, sickle cell related, opioid related, etc. No further GI evaluation at this time.

## 2016-03-06 ENCOUNTER — Telehealth: Payer: Self-pay | Admitting: Internal Medicine

## 2016-03-06 ENCOUNTER — Inpatient Hospital Stay: Admission: RE | Admit: 2016-03-06 | Payer: Medicare Other | Source: Ambulatory Visit

## 2016-03-06 ENCOUNTER — Telehealth: Payer: Self-pay

## 2016-03-07 ENCOUNTER — Other Ambulatory Visit: Payer: Self-pay | Admitting: Internal Medicine

## 2016-03-07 MED ORDER — OXYCODONE HCL 10 MG PO TABS: 10.0000 mg | ORAL_TABLET | ORAL | 0 refills | Status: DC

## 2016-03-19 ENCOUNTER — Ambulatory Visit (INDEPENDENT_AMBULATORY_CARE_PROVIDER_SITE_OTHER): Payer: Medicare Other | Admitting: Family Medicine

## 2016-03-19 ENCOUNTER — Encounter: Payer: Self-pay | Admitting: Family Medicine

## 2016-03-19 VITALS — BP 109/77 | HR 97 | Temp 98.1°F | Resp 18 | Ht 66.0 in | Wt 185.0 lb

## 2016-03-19 DIAGNOSIS — R112 Nausea with vomiting, unspecified: Secondary | ICD-10-CM

## 2016-03-19 DIAGNOSIS — Z23 Encounter for immunization: Secondary | ICD-10-CM

## 2016-03-19 LAB — CBC WITH DIFFERENTIAL/PLATELET
BASOS ABS: 0 {cells}/uL (ref 0–200)
BASOS PCT: 0 %
EOS PCT: 2 %
Eosinophils Absolute: 188 cells/uL (ref 15–500)
HCT: 36.7 % (ref 35.0–45.0)
HEMOGLOBIN: 11.9 g/dL (ref 11.7–15.5)
LYMPHS ABS: 2632 {cells}/uL (ref 850–3900)
Lymphocytes Relative: 28 %
MCH: 25.5 pg — ABNORMAL LOW (ref 27.0–33.0)
MCHC: 32.4 g/dL (ref 32.0–36.0)
MCV: 78.6 fL — ABNORMAL LOW (ref 80.0–100.0)
MONOS PCT: 5 %
Monocytes Absolute: 470 cells/uL (ref 200–950)
NEUTROS ABS: 6110 {cells}/uL (ref 1500–7800)
Neutrophils Relative %: 65 %
PLATELETS: 151 10*3/uL (ref 140–400)
RBC: 4.67 MIL/uL (ref 3.80–5.10)
RDW: 17.7 % — ABNORMAL HIGH (ref 11.0–15.0)
WBC: 9.4 10*3/uL (ref 3.8–10.8)

## 2016-03-19 LAB — COMPLETE METABOLIC PANEL WITH GFR
ALBUMIN: 4.7 g/dL (ref 3.6–5.1)
ALK PHOS: 58 U/L (ref 33–115)
ALT: 7 U/L (ref 6–29)
AST: 12 U/L (ref 10–35)
BILIRUBIN TOTAL: 2.7 mg/dL — AB (ref 0.2–1.2)
BUN: 11 mg/dL (ref 7–25)
CO2: 24 mmol/L (ref 20–31)
CREATININE: 0.71 mg/dL (ref 0.50–1.10)
Calcium: 9.7 mg/dL (ref 8.6–10.2)
Chloride: 109 mmol/L (ref 98–110)
GFR, Est African American: >89 mL/min (ref >=60)
GLUCOSE: 93 mg/dL (ref 65–99)
Potassium: 4.1 mmol/L (ref 3.5–5.3)
SODIUM: 141 mmol/L (ref 135–146)
TOTAL PROTEIN: 7.3 g/dL (ref 6.1–8.1)

## 2016-03-19 LAB — LIPASE: Lipase: <5 U/L — ABNORMAL LOW (ref 7–60)

## 2016-03-19 LAB — AMYLASE: Amylase: 34 U/L (ref 0–105)

## 2016-03-19 LAB — SEDIMENTATION RATE: SED RATE: 4 mm/h (ref 0–20)

## 2016-03-19 LAB — LACTATE DEHYDROGENASE: LDH: 174 U/L (ref 100–200)

## 2016-03-19 MED ORDER — METOCLOPRAMIDE HCL 10 MG PO TABS: 10.0000 mg | ORAL_TABLET | Freq: Three times a day (TID) | ORAL | 1 refills | Status: DC

## 2016-03-19 MED ORDER — OXYCODONE HCL 10 MG PO TABS: 10.0000 mg | ORAL_TABLET | ORAL | 0 refills | Status: DC

## 2016-03-19 NOTE — Progress Notes (Signed)
Patient is here for 3 month FU  Patient has taken medication. Patient has not eaten.  Patient would like flu vaccine today. Patient tolerated flu vaccine well today.

## 2016-03-20 NOTE — Patient Instructions (Signed)
Will schedule visit with Dr. Doreene Burke when labs back

## 2016-03-20 NOTE — Progress Notes (Signed)
Cassie Montgomery, is a 45 y.o. female  [id number]  [id number]  DOB - 1971-02-22  CC:  Chief Complaint  Patient presents with  . Follow-up       HPI: Cassie Montgomery is a 45 y.o. female here for follow-up sickle cell disease and nausea/vomiting and weight loss. The nausea and vomiting and weight loss has been going on for several months. Her weight was 220 about 6 months ago, now down to 185. She has been referred to GI with no reason found for her nausea/vomiting. She was referred back here for further evaluation. She reports Zofran has not helped.She has a history of anxiety and depression, asthma and chronic pain. She is on narcotic for her chronic and sickle cell pain. She has been tried on carafate and prilosec with no success. She has also been tried on Bentyl.   There has been no significant  Change in her sickle cell status. She does need refill on her narcotic .   Allergies  Allergen Reactions  . Hydrea [Hydroxyurea] Nausea Only  . Latex Swelling  . Sulfa Antibiotics Other (See Comments)    Increased bilirubin  . Adhesive [Tape] Rash   Past Medical History:  Diagnosis Date  . Anxiety   . Asthma in adult    probable  . Chronic pain syndrome   . Depression   . Ganglion cyst   . GERD (gastroesophageal reflux disease)   . Left arm pain    chronic  . Migraine with aura 07/15/2011  . PMDD (premenstrual dysphoric disorder)   . Sickle cell anemia (HCC)   . Sickle cell disease without crisis ([place]) 07/15/2011   Current Outpatient Prescriptions on File Prior to Visit  Medication Sig Dispense Refill  . acetaminophen (TYLENOL) 500 MG tablet Take 500 mg by mouth every 6 (six) hours as needed for fever.    Marland Kitchen albuterol (PROVENTIL HFA;VENTOLIN HFA) 108 (90 BASE) MCG/ACT inhaler Inhale 2 puffs into the lungs every 6 (six) hours as needed for wheezing or shortness of breath (wheezing). 1 Inhaler 3  . Artificial Tear Ointment (DRY EYES OP) Apply 2 drops to eye daily as needed  (dry eyes).    . diphenhydrAMINE (BENADRYL) 25 MG tablet Take 1-2 tablets (25-50 mg total) by mouth at bedtime as needed for sleep. 60 tablet 2  . fluticasone (FLONASE) 50 MCG/ACT nasal spray Place 2 sprays into both nostrils daily as needed for allergies or rhinitis (allergies).     . folic acid (FOLVITE) 1 MG tablet Take 1 tablet (1 mg total) by mouth every morning. 30 tablet 11  . omeprazole (PRILOSEC) 20 MG capsule Take 1 capsule (20 mg total) by mouth 2 (two) times daily before a meal. 60 capsule 11  . ondansetron (ZOFRAN) 4 MG tablet Take 1 tablet (4 mg total) by mouth every 6 (six) hours.    Marland Kitchen dicyclomine (BENTYL) 10 MG capsule Take 1 capsule (10 mg total) by mouth 4 (four) times daily -  before meals and at bedtime. (Patient not taking: Reported on 03/19/2016) 120 capsule 5  . ibuprofen (ADVIL,MOTRIN) 600 MG tablet Take 1 tablet (600 mg total) by mouth every 8 (eight) hours as needed for moderate pain. (Patient not taking: Reported on 03/19/2016) 30 tablet 1   No current facility-administered medications on file prior to visit.    Family History  Problem Relation Age of Onset  . Hypertension Mother   . Asthma Son   . Asthma Son   . Asthma Daughter  Social History   Social History  . Marital status: Single    Spouse name: N/A  . Number of children: 3  . Years of education: N/A   Occupational History  . Not on file.   Social History Main Topics  . Smoking status: Never Smoker  . Smokeless tobacco: Never Used  . Alcohol use No  . Drug use: No  . Sexual activity: Yes    Birth control/ protection: None   Other Topics Concern  . Not on file   Social History Narrative   Uses a cane occasionally.  Does not work.      Review of Systems: Constitutional: + fatigue, hot/cold, sweating, weight loss Skin: Negative HENT: Negative  Eyes: Negative  Neck: Negative Respiratory: Negative Cardiovascular: Negative Gastrointestinal: + abd pain, nausea/vomiting Genitourinary:  Negative  Musculoskeletal: Negative   Neurological: Negative for Hematological: Negative  Psychiatric/Behavioral: Negative    Objective:   Vitals:   03/19/16 0909  BP: 109/77  Pulse: 97  Resp: 18  Temp: 98.1 F (36.7 C)    Physical Exam: Constitutional: Patient appears well-developed and well-nourished. No distress. HENT: Normocephalic, atraumatic, External right and left ear normal. Oropharynx is clear and moist.  Eyes: Conjunctivae and EOM are normal. PERRLA, no scleral icterus. Neck: Normal ROM. Neck supple. No lymphadenopathy, No thyromegaly. CVS: RRR, S1/S2 +, no murmurs, no gallops, no rubs Pulmonary: Effort and breath sounds normal, no stridor, rhonchi, wheezes, rales.  Abdominal: Soft. Normoactive BS,, no distension, tenderness, rebound or guarding.  Musculoskeletal: Normal range of motion. No edema and no tenderness.  Neuro: Alert.Normal muscle tone coordination. Non-focal Skin: Skin is warm and dry. No rash noted. Not diaphoretic. No erythema. No pallor. Psychiatric: Normal mood and affect. Behavior, judgment, thought content normal.  Lab Results  Component Value Date   WBC 9.4 03/19/2016   HGB 11.9 03/19/2016   HCT 36.7 03/19/2016   MCV 78.6 (L) 03/19/2016   PLT 151 03/19/2016   Lab Results  Component Value Date   CREATININE 0.71 03/19/2016   BUN 11 03/19/2016   NA 141 03/19/2016   K 4.1 03/19/2016   CL 109 03/19/2016   CO2 24 03/19/2016    Lab Results  Component Value Date   HGBA1C 4.1 11/24/2014   Lipid Panel     Component Value Date/Time   CHOL 99 (L) 10/10/2015 1250   TRIG 93 10/10/2015 1250   HDL 32 (L) 10/10/2015 1250   CHOLHDL 3.1 10/10/2015 1250   VLDL 19 10/10/2015 1250   LDLCALC 48 10/10/2015 1250        Assessment and plan:   1. Needs flu shot  - Flu Vaccine QUAD 36+ mos PF IM (Fluarix & Fluzone Quad PF)  2. Nausea and vomiting, intractability of vomiting not specified, unspecified vomiting type - Discussed with Dr. Doreene Burke.   - CBC with Differential - COMPLETE METABOLIC PANEL WITH GFR - Lipase - Amylase - Sedimentation Rate - Lactate Dehydrogenase - Celiac Disease Panel  3. Sickle cell disease -Refill of oxycodone  Will have follow-up with Dr. Doreene Burke as soon as possible.    The patient was given clear instructions to go to ER or return to medical center if symptoms don't improve, worsen or new problems develop. The patient verbalized understanding.    Micheline Chapman FNP  03/20/2016, 10:01 AM

## 2016-03-25 LAB — CELIAC DISEASE PANEL
ENDOMYSIAL IGA: NEGATIVE
IgA/Immunoglobulin A, Serum: 192 mg/dL (ref 87–352)

## 2016-03-26 ENCOUNTER — Telehealth: Payer: Self-pay

## 2016-03-26 NOTE — Telephone Encounter (Signed)
Cassie Montgomery, Patient is still vomiting and nausea. What else can be done. She has already seen Gastro and they asked her to follow up here cause all the testing was ok. Please advise. Thanks!

## 2016-03-27 ENCOUNTER — Other Ambulatory Visit: Payer: Self-pay | Admitting: Family Medicine

## 2016-03-27 MED ORDER — PROMETHAZINE HCL 25 MG PO TABS
25.0000 mg | ORAL_TABLET | Freq: Three times a day (TID) | ORAL | 0 refills | Status: DC | PRN
Start: 2016-03-27 — End: 2016-04-09

## 2016-03-27 NOTE — Telephone Encounter (Signed)
I am sending in prescription for phenergan. She needs appt with Dr. Doreene Burke ASAP

## 2016-03-27 NOTE — Telephone Encounter (Signed)
Called, no answer. Left message advising that rx for phenergan had been sent to pharmacy and that patient needed to see dr. Doreene Burke. Advised patient that we have set up an appointment for 04/09/2016 @9 :00am and if this needs to be changed to please call our office. Thanks!

## 2016-04-01 ENCOUNTER — Telehealth: Payer: Self-pay

## 2016-04-01 ENCOUNTER — Other Ambulatory Visit: Payer: Self-pay | Admitting: Internal Medicine

## 2016-04-01 MED ORDER — OXYCODONE HCL 10 MG PO TABS: 10.0000 mg | ORAL_TABLET | ORAL | 0 refills | Status: DC

## 2016-04-03 NOTE — Telephone Encounter (Signed)
ERROR

## 2016-04-09 ENCOUNTER — Encounter: Payer: Self-pay | Admitting: Internal Medicine

## 2016-04-09 ENCOUNTER — Ambulatory Visit (INDEPENDENT_AMBULATORY_CARE_PROVIDER_SITE_OTHER): Payer: Medicare Other | Admitting: Internal Medicine

## 2016-04-09 VITALS — Ht 66.0 in | Wt 190.0 lb

## 2016-04-09 DIAGNOSIS — R112 Nausea with vomiting, unspecified: Secondary | ICD-10-CM | POA: Diagnosis not present

## 2016-04-09 DIAGNOSIS — D5700 Hb-SS disease with crisis, unspecified: Secondary | ICD-10-CM | POA: Diagnosis not present

## 2016-04-09 DIAGNOSIS — N951 Menopausal and female climacteric states: Secondary | ICD-10-CM | POA: Diagnosis not present

## 2016-04-09 MED ORDER — PROMETHAZINE HCL 25 MG PO TABS: 25.0000 mg | ORAL_TABLET | Freq: Three times a day (TID) | ORAL | 0 refills | Status: DC | PRN

## 2016-04-09 MED ORDER — OXYCODONE HCL ER 15 MG PO T12A: 15.0000 mg | EXTENDED_RELEASE_TABLET | Freq: Two times a day (BID) | ORAL | 0 refills | Status: DC

## 2016-04-09 MED ORDER — ESCITALOPRAM OXALATE 20 MG PO TABS
20.0000 mg | ORAL_TABLET | Freq: Every day | ORAL | 3 refills | Status: DC
Start: 2016-04-09 — End: 2016-04-12

## 2016-04-09 NOTE — Patient Instructions (Signed)
Sickle Cell Anemia, Adult Sickle cell anemia is a condition where your red blood cells are shaped like sickles. Red blood cells carry oxygen through the body. Sickle-shaped red blood cells do not live as long as normal red blood cells. They also clump together and block blood from flowing through the blood vessels. These things prevent the body from getting enough oxygen. Sickle cell anemia causes organ damage and pain. It also increases the risk of infection. Follow these instructions at home:  Drink enough fluid to keep your pee (urine) clear or pale yellow. Drink more in hot weather and during exercise.  Do not smoke. Smoking lowers oxygen levels in the blood.  Only take over-the-counter or prescription medicines as told by your doctor.  Take antibiotic medicines as told by your doctor. Make sure you finish them even if you start to feel better.  Take supplements as told by your doctor.  Consider wearing a medical alert bracelet. This tells anyone caring for you in an emergency of your condition.  When traveling, keep your medical information, doctors' names, and the medicines you take with you at all times.  If you have a fever, do not take fever medicines right away. This could cover up a problem. Tell your doctor.  Keep all follow-up visits with your doctor. Sickle cell anemia requires regular medical care. Contact a doctor if: You have a fever. Get help right away if:  You feel dizzy or faint.  You have new belly (abdominal) pain, especially on the left side near the stomach area.  You have a lasting, often uncomfortable and painful erection of the penis (priapism). If it is not treated right away, you will become unable to have sex (impotence).  You have numbness in your arms or legs or you have a hard time moving them.  You have a hard time talking.  You have a fever or lasting symptoms for more than 2-3 days.  You have a fever and your symptoms suddenly get  worse.  You have signs or symptoms of infection. These include:  Chills.  Being more tired than normal (lethargy).  Irritability.  Poor eating.  Throwing up (vomiting).  You have pain that is not helped with medicine.  You have shortness of breath.  You have pain in your chest.  You are coughing up pus-like or bloody mucus.  You have a stiff neck.  Your feet or hands swell or have pain.  Your belly looks bloated.  Your joints hurt. This information is not intended to replace advice given to you by your health care provider. Make sure you discuss any questions you have with your health care provider. Document Released: 02/17/2013 Document Revised: 10/05/2015 Document Reviewed: 12/09/2012 Elsevier Interactive Patient Education  2017 [employer] Nausea and Vomiting, Adult Introduction Feeling sick to your stomach (nausea) means that your stomach is upset or you feel like you have to throw up (vomit). Feeling more and more sick to your stomach can lead to throwing up. Throwing up happens when food and liquid from your stomach are thrown up and out the mouth. Throwing up can make you feel weak and cause you to get dehydrated. Dehydration can make you tired and thirsty, make you have a dry mouth, and make it so you pee (urinate) less often. Older adults and people with other diseases or a weak defense system (immune system) are at higher risk for dehydration. If you feel sick to your stomach or if you throw up, it is  important to follow instructions from your doctor about how to take care of yourself. Follow these instructions at home: Eating and drinking Follow these instructions as told by your doctor:  Take an oral rehydration solution (ORS). This is a drink that is sold at pharmacies and stores.  Drink clear fluids in small amounts as you are able, such as:  Water.  Ice chips.  Diluted fruit juice.  Low-calorie sports drinks.  Eat bland, easy-to-digest foods in  small amounts as you are able, such as:  Bananas.  Applesauce.  Rice.  Low-fat (lean) meats.  Toast.  Crackers.  Avoid fluids that have a lot of sugar or caffeine in them.  Avoid alcohol.  Avoid spicy or fatty foods. General instructions  Drink enough fluid to keep your pee (urine) clear or pale yellow.  Wash your hands often. If you cannot use soap and water, use hand sanitizer.  Make sure that all people in your home wash their hands well and often.  Take over-the-counter and prescription medicines only as told by your doctor.  Rest at home while you get better.  Watch your condition for any changes.  Breathe slowly and deeply when you feel sick to your stomach.  Keep all follow-up visits as told by your doctor. This is important. Contact a doctor if:  You have a fever.  You cannot keep fluids down.  Your symptoms get worse.  You have new symptoms.  You feel sick to your stomach for more than two days.  You feel light-headed or dizzy.  You have a headache.  You have muscle cramps. Get help right away if:  You have pain in your chest, neck, arm, or jaw.  You feel very weak or you pass out (faint).  You throw up again and again.  You see blood in your throw-up.  Your throw-up looks like black coffee grounds.  You have bloody or black poop (stools) or poop that look like tar.  You have a very bad headache, a stiff neck, or both.  You have a rash.  You have very bad pain, cramping, or bloating in your belly (abdomen).  You have trouble breathing.  You are breathing very quickly.  Your heart is beating very quickly.  Your skin feels cold and clammy.  You feel confused.  You have pain when you pee.  You have signs of dehydration, such as:  Dark pee, hardly any pee, or no pee.  Cracked lips.  Dry mouth.  Sunken eyes.  Sleepiness.  Weakness. These symptoms may be an emergency. Do not wait to see if the symptoms will go away.  Get medical help right away. Call your local emergency services (911 in the [place] Do not drive yourself to the hospital.  This information is not intended to replace advice given to you by your health care provider. Make sure you discuss any questions you have with your health care provider. Document Released: 10/16/2007 Document Revised: 11/17/2015 Document Reviewed: 01/03/2015  2017 Elsevier

## 2016-04-09 NOTE — Progress Notes (Signed)
Patient is here for N/V  Patient last vomited this morning (oxycodone was taken prior)  Patient has had coffee this morning. Patient has taken medication today.  Patient states Phenergan does provide relief.

## 2016-04-09 NOTE — Progress Notes (Signed)
Cassie Montgomery, is a 45 y.o. female  [id number]  [id number]  DOB - April 11, 1971  Chief Complaint  Patient presents with  . Nausea      Subjective:   Cassie Montgomery is a 46 y.o. female with history of sickle cell disease here today for a follow up visit for nausea and vomiting. Patient has disturbing pattern of early morning nausea and vomiting, she is not able to keep her morning medications down sometimes and for that reason has had sickle cell pains in the mornings.  Patient has noticed an association between an early morning hot flashes preceding intense nausea and then vomiting. She is otherwise ok during the day except that she is scared to eat which is contributing to her weight loss. She has been having irregular menses for about 6 months and scanty bleeding even when she has her menstrual flow. She agrees this may be perimenopausal. She has no headache, no blurry or double vision. No abdominal pain associated with vomiting, she may be constipated occasionally because of poor oral intake due to fear of vomiting. She has been extensively worked up by GI specialist including Upper GI endoscopy, Gastric Emptying Studies and CT abdomen and pelvis, with no significant finding. She has no other symptom today.    Problem  Menopausal Syndrome  Perimenopausal Symptoms  Nausea and Vomiting in Adult   ALLERGIES: Allergies  Allergen Reactions  . Hydrea [Hydroxyurea] Nausea Only  . Latex Swelling  . Sulfa Antibiotics Other (See Comments)    Increased bilirubin  . Adhesive [Tape] Rash    PAST MEDICAL HISTORY: Past Medical History:  Diagnosis Date  . Anxiety   . Asthma in adult    probable  . Chronic pain syndrome   . Depression   . Ganglion cyst   . GERD (gastroesophageal reflux disease)   . Left arm pain    chronic  . Migraine with aura 07/15/2011  . PMDD (premenstrual dysphoric disorder)   . Sickle cell anemia (HCC)   . Sickle cell disease without crisis ([place])  07/15/2011    MEDICATIONS AT HOME: Prior to Admission medications   Medication Sig Start Date End Date Taking? Authorizing Provider  acetaminophen (TYLENOL) 500 MG tablet Take 500 mg by mouth every 6 (six) hours as needed for fever.   Yes Historical Provider, MD  albuterol (PROVENTIL HFA;VENTOLIN HFA) 108 (90 BASE) MCG/ACT inhaler Inhale 2 puffs into the lungs every 6 (six) hours as needed for wheezing or shortness of breath (wheezing). 04/17/15  Yes Dorena Dew, FNP  Artificial Tear Ointment (DRY EYES OP) Apply 2 drops to eye daily as needed (dry eyes).   Yes Historical Provider, MD  diphenhydrAMINE (BENADRYL) 25 MG tablet Take 1-2 tablets (25-50 mg total) by mouth at bedtime as needed for sleep. 10/10/15  Yes Tresa Garter, MD  fluticasone (FLONASE) 50 MCG/ACT nasal spray Place 2 sprays into both nostrils daily as needed for allergies or rhinitis (allergies).    Yes Historical Provider, MD  folic acid (FOLVITE) 1 MG tablet Take 1 tablet (1 mg total) by mouth every morning. 05/26/14  Yes [name], MD  omeprazole (PRILOSEC) 20 MG capsule Take 1 capsule (20 mg total) by mouth 2 (two) times daily before a meal. 12/12/15  Yes Ladene Artist, MD  Oxycodone HCl 10 MG TABS Take 1 tablet (10 mg total) by mouth every 4 (four) hours. 04/01/16 04/16/16 Yes Tresa Garter, MD  promethazine (PHENERGAN) 25 MG tablet Take 1 tablet (25  mg total) by mouth every 8 (eight) hours as needed for nausea or vomiting. 04/09/16  Yes Tresa Garter, MD  escitalopram (LEXAPRO) 20 MG tablet Take 1 tablet (20 mg total) by mouth daily. 04/09/16   Tresa Garter, MD  oxyCODONE (OXYCONTIN) 15 mg 12 hr tablet Take 1 tablet (15 mg total) by mouth every 12 (twelve) hours. 04/09/16   Tresa Garter, MD    Objective:   Vitals:   04/09/16 0847  Weight: 190 lb (86.2 kg)  Height: 5\' 6"  (1.676 m)   Exam General appearance : Awake, alert, not in any distress. Speech Clear. Not toxic  looking HEENT: Atraumatic and Normocephalic, pupils equally reactive to light and accomodation Neck: Supple, no JVD. No cervical lymphadenopathy.  Chest: Good air entry bilaterally, no added sounds  CVS: S1 S2 regular, no murmurs.  Abdomen: Bowel sounds present, Non tender and not distended with no gaurding, rigidity or rebound. Extremities: B/L Lower Ext shows no edema, both legs are warm to touch Neurology: Awake alert, and oriented X 3, CN II-XII intact, Non focal Skin: No Rash  Data Review Lab Results  Component Value Date   HGBA1C 4.1 11/24/2014   HGBA1C 4.0 08/27/2013   HGBA1C  07/27/2008    (NOTE)   The ADA recommends the following therapeutic goal for glycemic   control related to Hgb A1C measurement:   Goal of Therapy:   < 7.0% Hgb A1C   Reference: American Diabetes Association: Clinical Practice   Recommendations 2008, Diabetes Care,  2008, 31:(Suppl 1). Unable to determine HgbA1C using standard method due to interference of probable abnormal hemoglobin. Suggest Glycohemoglobin, Total (80088).     Assessment & Plan   1. Nausea and vomiting in adult  - promethazine (PHENERGAN) 25 MG tablet; Take 1 tablet (25 mg total) by mouth every 8 (eight) hours as needed for nausea or vomiting.  Dispense: 20 tablet; Refill: 0 - No gastrointestinal cause has been identified. Upper GI endoscopy was normal as well as Gastric Emptying study.   - I think this is most likely related to perimenopausal syndrome and some depression associated with it. Patient has noticed an association between early morning hot flashes preceding intense nausea and then vomiting. She is otherwise ok during the day except that she is scared to eat which is contributing to her weight loss. She has been having irregular menses for about 6 months and scanty bleeding even when she has her menstrual flow.   - Will start patient on SSRI today and a long acting pain medication which if taken at night, should work though her  morning symptoms and she would not have to be in pain because of vomiting her am pills.   CT Abdomen and Pelvis on 02/02/2016 showed:  IMPRESSION:  1. No acute abnormality seen to explain the patient's symptoms. 2. A 2.2 cm hypodensity within the right hepatic lobe appears mildly increased in size from 2013 but more likely still reflects a hemangioma given the very slow rate of growth. Depending on the degree of clinical concern, dynamic liver protocol MRI or CT could be considered for further evaluation. 3. Mild splenomegaly. 4. Likely small uterine fibroids noted  - Discontinue Reglan as it is not providing any benefit.  - Discontinue Carafate as it is not providing any benefit.   - Discontinue Zofran as it is not helpful - Continue omeprazole 20 mg twice a day.   2. Sickle cell anemia with pain (HCC)  Add - oxyCODONE (OXYCONTIN)  15 mg 12 hr tablet; Take 1 tablet (15 mg total) by mouth every 12 (twelve) hours.  Dispense: 30 tablet; Refill: 0  Patient is allergic to Hydrea.  Continue folic acid 1 mg daily to prevent aplastic bone marrow crises.   Pulmonary evaluation - Patient denies severe recurrent wheezes, shortness of breath with exercise, or persistent cough. If these symptoms develop, pulmonary function tests with spirometry will be ordered, and if abnormal, plan on referral to Pulmonology for further evaluation.  Immunization status - Yearly influenza vaccination is recommended, as well as being up to date with Meningococcal and Pneumococcal vaccines.   Acute and chronic painful episodes - We agreed on Opiate dose and amount of pills  per month. We discussed that pt is to receive Schedule II prescriptions only from our clinic. Pt is also aware that the prescription history is available to [place] online through the [facility] CSRS. Controlled substance agreement reviewed and signed. We reminded [place] that all patients receiving Schedule II narcotics must be seen for follow within one  month of prescription being requested. We reviewed the terms of our pain agreement, including the need to keep medicines in a safe locked location away from children or pets, and the need to report excess sedation or constipation, measures to avoid constipation, and policies related to early refills and stolen prescriptions. According to the [place] [facility] program, we have reviewed details related to analgesia, adverse effects and aberrant behaviors.  3. Perimenopausal symptoms  - escitalopram (LEXAPRO) 20 MG tablet; Take 1 tablet (20 mg total) by mouth daily.  Dispense: 30 tablet; Refill: 3  Patient have been counseled extensively about nutrition and exercise. Other issues discussed during this visit include: low cholesterol diet, weight control and daily exercise, importance of adherence with medications and regular follow-up.   Return in about 4 weeks (around 05/07/2016) for Nausea and Vomiting, Sickle Cell Disease/Pain.  The patient was given clear instructions to go to ER or return to medical center if symptoms don't improve, worsen or new problems develop. The patient verbalized understanding. The patient was told to call to get lab results if they haven't heard anything in the next week.   This note has been created with Surveyor, quantity. Any transcriptional errors are unintentional.    Angelica Chessman, MD, [place], Karilyn Cota, [place] and [place], [place]   04/09/2016, 9:26 AM

## 2016-04-10 NOTE — Telephone Encounter (Signed)
No additional actions were needed

## 2016-04-11 ENCOUNTER — Telehealth: Payer: Self-pay

## 2016-04-12 ENCOUNTER — Telehealth: Payer: Self-pay

## 2016-04-12 ENCOUNTER — Other Ambulatory Visit: Payer: Self-pay | Admitting: Internal Medicine

## 2016-04-12 MED ORDER — FLUOXETINE HCL 40 MG PO CAPS: 40.0000 mg | ORAL_CAPSULE | Freq: Every day | ORAL | 3 refills | Status: DC

## 2016-04-12 NOTE — Telephone Encounter (Signed)
Changed to Fluoxetine 40 mg PO daily

## 2016-04-12 NOTE — Telephone Encounter (Signed)
Dr. Doreene Burke, Patient's insurance will not pay for Oxycontin 15mg . I have verified this with The pharmacists "Thayer Headings" at [facility]. Patient is asking if you can change it to something else. Please advise. Thanks!

## 2016-04-15 ENCOUNTER — Telehealth: Payer: Self-pay

## 2016-04-15 ENCOUNTER — Other Ambulatory Visit: Payer: Self-pay | Admitting: Internal Medicine

## 2016-04-15 MED ORDER — MORPHINE SULFATE ER 15 MG PO TBCR: 15.0000 mg | EXTENDED_RELEASE_TABLET | Freq: Two times a day (BID) | ORAL | 0 refills | Status: DC

## 2016-04-15 MED ORDER — OXYCODONE HCL 10 MG PO TABS: 10.0000 mg | ORAL_TABLET | ORAL | 0 refills | Status: DC

## 2016-04-15 NOTE — Telephone Encounter (Signed)
Medication has been changed to MS Contin. Thanks

## 2016-04-15 NOTE — Telephone Encounter (Signed)
Refilled. Thanks!

## 2016-04-15 NOTE — Telephone Encounter (Signed)
I tried to reach Cassie Montgomery and the telephone number states it is only for text. Please inform her of the Lexapro being changed to Fluoxetine.

## 2016-04-15 NOTE — Telephone Encounter (Signed)
Thank you for this information.

## 2016-05-03 ENCOUNTER — Other Ambulatory Visit: Payer: Self-pay | Admitting: Internal Medicine

## 2016-05-03 ENCOUNTER — Telehealth: Payer: Self-pay

## 2016-05-03 MED ORDER — OXYCODONE HCL 10 MG PO TABS: 10.0000 mg | ORAL_TABLET | ORAL | 0 refills | Status: DC

## 2016-05-09 ENCOUNTER — Ambulatory Visit: Payer: Medicare Other | Admitting: Family Medicine

## 2016-05-15 ENCOUNTER — Telehealth: Payer: Self-pay

## 2016-05-16 ENCOUNTER — Other Ambulatory Visit: Payer: Self-pay | Admitting: Internal Medicine

## 2016-05-16 MED ORDER — OXYCODONE HCL 10 MG PO TABS: 10.0000 mg | ORAL_TABLET | ORAL | 0 refills | Status: DC

## 2016-05-30 ENCOUNTER — Other Ambulatory Visit: Payer: Self-pay | Admitting: Internal Medicine

## 2016-05-30 ENCOUNTER — Telehealth: Payer: Self-pay

## 2016-05-30 MED ORDER — MORPHINE SULFATE ER 15 MG PO TBCR: 15.0000 mg | EXTENDED_RELEASE_TABLET | Freq: Two times a day (BID) | ORAL | 0 refills | Status: DC

## 2016-05-30 MED ORDER — MORPHINE SULFATE 15 MG PO TABS: 15.0000 mg | ORAL_TABLET | ORAL | 0 refills | Status: DC | PRN

## 2016-05-30 NOTE — Progress Notes (Signed)
Discontinued Oxycodone 10 mg Q4H prn. Start Morphine MSIR 15 mg Q4H prn, prescribed 90 tablets with no refill today 05/30/2016.

## 2016-05-31 ENCOUNTER — Other Ambulatory Visit: Payer: Self-pay | Admitting: Internal Medicine

## 2016-05-31 MED ORDER — OXYCODONE HCL 10 MG PO TABS: 10.0000 mg | ORAL_TABLET | ORAL | 0 refills | Status: DC | PRN

## 2016-06-13 ENCOUNTER — Telehealth: Payer: Self-pay

## 2016-06-13 ENCOUNTER — Other Ambulatory Visit: Payer: Self-pay | Admitting: Internal Medicine

## 2016-06-13 MED ORDER — OXYCODONE HCL 10 MG PO TABS: 10.0000 mg | ORAL_TABLET | ORAL | 0 refills | Status: DC | PRN

## 2016-06-19 ENCOUNTER — Ambulatory Visit: Payer: Medicare Other | Admitting: Family Medicine

## 2016-06-19 DIAGNOSIS — R030 Elevated blood-pressure reading, without diagnosis of hypertension: Secondary | ICD-10-CM | POA: Diagnosis not present

## 2016-06-19 DIAGNOSIS — D5700 Hb-SS disease with crisis, unspecified: Secondary | ICD-10-CM | POA: Diagnosis not present

## 2016-06-19 DIAGNOSIS — I10 Essential (primary) hypertension: Secondary | ICD-10-CM | POA: Diagnosis not present

## 2016-06-20 ENCOUNTER — Emergency Department (HOSPITAL_COMMUNITY): Payer: Medicare Other

## 2016-06-20 ENCOUNTER — Encounter (HOSPITAL_COMMUNITY): Payer: Self-pay

## 2016-06-20 ENCOUNTER — Emergency Department (HOSPITAL_COMMUNITY)
Admission: EM | Admit: 2016-06-20 | Discharge: 2016-06-20 | Disposition: A | Discharge status: Home / Self Care | Payer: Medicare Other | Attending: Emergency Medicine | Admitting: Emergency Medicine

## 2016-06-20 DIAGNOSIS — R079 Chest pain, unspecified: Secondary | ICD-10-CM | POA: Diagnosis not present

## 2016-06-20 DIAGNOSIS — D5700 Hb-SS disease with crisis, unspecified: Secondary | ICD-10-CM | POA: Diagnosis not present

## 2016-06-20 DIAGNOSIS — R112 Nausea with vomiting, unspecified: Secondary | ICD-10-CM

## 2016-06-20 DIAGNOSIS — R0789 Other chest pain: Secondary | ICD-10-CM | POA: Diagnosis not present

## 2016-06-20 DIAGNOSIS — J45909 Unspecified asthma, uncomplicated: Secondary | ICD-10-CM | POA: Insufficient documentation

## 2016-06-20 DIAGNOSIS — Z91040 Latex allergy status: Secondary | ICD-10-CM | POA: Diagnosis not present

## 2016-06-20 DIAGNOSIS — Z79899 Other long term (current) drug therapy: Secondary | ICD-10-CM | POA: Diagnosis not present

## 2016-06-20 LAB — CBC WITH DIFFERENTIAL/PLATELET
Basophils Absolute: 0 10*3/uL (ref 0.0–0.1)
Basophils Relative: 0 %
EOS PCT: 0 %
Eosinophils Absolute: 0 10*3/uL (ref 0.0–0.7)
HCT: 35.4 % — ABNORMAL LOW (ref 36.0–46.0)
Hemoglobin: 12.8 g/dL (ref 12.0–15.0)
LYMPHS ABS: 2.5 10*3/uL (ref 0.7–4.0)
LYMPHS PCT: 17 %
MCH: 25.4 pg — AB (ref 26.0–34.0)
MCHC: 36.2 g/dL — ABNORMAL HIGH (ref 30.0–36.0)
MCV: 70.4 fL — AB (ref 78.0–100.0)
MONO ABS: 1.1 10*3/uL — AB (ref 0.1–1.0)
Monocytes Relative: 7 %
Neutro Abs: 11 10*3/uL — ABNORMAL HIGH (ref 1.7–7.7)
Neutrophils Relative %: 76 %
PLATELETS: 166 10*3/uL (ref 150–400)
RBC: 5.03 MIL/uL (ref 3.87–5.11)
RDW: 16.7 % — AB (ref 11.5–15.5)
WBC: 14.6 10*3/uL — ABNORMAL HIGH (ref 4.0–10.5)

## 2016-06-20 LAB — COMPREHENSIVE METABOLIC PANEL
ALBUMIN: 5 g/dL (ref 3.5–5.0)
ALT: 19 U/L (ref 14–54)
AST: 24 U/L (ref 15–41)
Alkaline Phosphatase: 66 U/L (ref 38–126)
Anion gap: 11 (ref 5–15)
BUN: 17 mg/dL (ref 6–20)
CHLORIDE: 102 mmol/L (ref 101–111)
CO2: 26 mmol/L (ref 22–32)
Calcium: 9.7 mg/dL (ref 8.9–10.3)
Creatinine, Ser: 0.89 mg/dL (ref 0.44–1.00)
GFR calc Af Amer: >60 mL/min (ref >60)
GFR calc non Af Amer: >60 mL/min (ref >60)
GLUCOSE: 113 mg/dL — AB (ref 65–99)
POTASSIUM: 3.1 mmol/L — AB (ref 3.5–5.1)
Sodium: 139 mmol/L (ref 135–145)
Total Bilirubin: 3.1 mg/dL — ABNORMAL HIGH (ref 0.3–1.2)
Total Protein: 8.2 g/dL — ABNORMAL HIGH (ref 6.5–8.1)

## 2016-06-20 LAB — RETICULOCYTES
RBC.: 5.03 MIL/uL (ref 3.87–5.11)
Retic Count, Absolute: 196.2 10*3/uL — ABNORMAL HIGH (ref 19.0–186.0)
Retic Ct Pct: 3.9 % — ABNORMAL HIGH (ref 0.4–3.1)

## 2016-06-20 LAB — LIPASE, BLOOD: Lipase: 13 U/L (ref 11–51)

## 2016-06-20 MED ORDER — ONDANSETRON HCL 4 MG/2ML IJ SOLN
4.0000 mg | Freq: Once | INTRAMUSCULAR | Status: AC
  Administered 2016-06-20: 4 mg via INTRAVENOUS
  Filled 2016-06-20: qty 2

## 2016-06-20 MED ORDER — HYDROMORPHONE HCL 2 MG/ML IJ SOLN: 2.0000 mg | INTRAMUSCULAR | Status: AC

## 2016-06-20 MED ORDER — HYDROMORPHONE HCL 2 MG/ML IJ SOLN
2.0000 mg | INTRAMUSCULAR | Status: AC
  Administered 2016-06-20: 2 mg via INTRAVENOUS
  Filled 2016-06-20: qty 1

## 2016-06-20 MED ORDER — HYDROMORPHONE HCL 2 MG/ML IJ SOLN: 2.0000 mg | INTRAMUSCULAR | Status: DC

## 2016-06-20 MED ORDER — IOPAMIDOL (ISOVUE-370) INJECTION 76%
100.0000 mL | Freq: Once | INTRAVENOUS | Status: AC | PRN
  Administered 2016-06-20: 100 mL via INTRAVENOUS

## 2016-06-20 MED ORDER — GI COCKTAIL ~~LOC~~
30.0000 mL | Freq: Once | ORAL | Status: AC
  Administered 2016-06-20: 30 mL via ORAL
  Filled 2016-06-20: qty 30

## 2016-06-20 MED ORDER — HYDROMORPHONE HCL 2 MG/ML IJ SOLN
2.0000 mg | INTRAMUSCULAR | Status: DC
  Administered 2016-06-20: 2 mg via INTRAVENOUS

## 2016-06-20 MED ORDER — HYDROMORPHONE HCL 2 MG/ML IJ SOLN
2.0000 mg | Freq: Once | INTRAMUSCULAR | Status: AC
  Administered 2016-06-20: 2 mg via INTRAVENOUS
  Filled 2016-06-20: qty 1

## 2016-06-20 MED ORDER — DEXTROSE-NACL 5-0.45 % IV SOLN
INTRAVENOUS | Status: DC
  Administered 2016-06-20: 02:00:00 via INTRAVENOUS

## 2016-06-20 MED ORDER — PROMETHAZINE HCL 25 MG/ML IJ SOLN
25.0000 mg | Freq: Once | INTRAMUSCULAR | Status: AC
  Administered 2016-06-20: 25 mg via INTRAVENOUS
  Filled 2016-06-20: qty 1

## 2016-06-20 MED ORDER — IOPAMIDOL (ISOVUE-370) INJECTION 76%
INTRAVENOUS | Status: AC
  Administered 2016-06-20: 100 mL via INTRAVENOUS
  Filled 2016-06-20: qty 100

## 2016-06-20 MED ORDER — ONDANSETRON HCL 4 MG/2ML IJ SOLN: 4.0000 mg | INTRAMUSCULAR | Status: DC | PRN

## 2016-06-20 MED ORDER — KETOROLAC TROMETHAMINE 30 MG/ML IJ SOLN
30.0000 mg | Freq: Once | INTRAMUSCULAR | Status: AC
  Administered 2016-06-20: 30 mg via INTRAVENOUS
  Filled 2016-06-20: qty 1

## 2016-06-20 MED ORDER — HYDROMORPHONE HCL 2 MG/ML IJ SOLN
2.0000 mg | INTRAMUSCULAR | Status: AC
  Filled 2016-06-20: qty 1

## 2016-06-20 MED ORDER — SODIUM CHLORIDE 0.9 % IJ SOLN
INTRAMUSCULAR | Status: AC
  Filled 2016-06-20: qty 50

## 2016-06-20 MED ORDER — HYDROMORPHONE HCL 1 MG/ML IJ SOLN
0.5000 mg | Freq: Once | INTRAMUSCULAR | Status: AC
Start: 2016-06-20 — End: 2016-06-20
  Administered 2016-06-20: 0.5 mg via INTRAVENOUS
  Filled 2016-06-20: qty 1

## 2016-06-20 MED ORDER — POTASSIUM CHLORIDE CRYS ER 20 MEQ PO TBCR
40.0000 meq | EXTENDED_RELEASE_TABLET | Freq: Once | ORAL | Status: AC
  Administered 2016-06-20: 40 meq via ORAL
  Filled 2016-06-20: qty 2

## 2016-06-20 MED ORDER — DIPHENHYDRAMINE HCL 50 MG/ML IJ SOLN
25.0000 mg | Freq: Once | INTRAMUSCULAR | Status: AC
  Administered 2016-06-20: 25 mg via INTRAVENOUS
  Filled 2016-06-20: qty 1

## 2016-06-20 NOTE — ED Triage Notes (Addendum)
Pt states she has had SCC pain since Monday and started having chest pain with it today. Taking oxycodone at home w/o relief. Usually works for her. Called PCP today who told her to come in tomorrow but she felt worse tonight. Alert and oriented. 4mg  zofran given IV en route.

## 2016-06-20 NOTE — Discharge Instructions (Signed)
Read the information below.  You may return to the Emergency Department at any time for worsening condition or any new symptoms that concern you.   If you develop worsening chest pain, shortness of breath, fever, you pass out, or become weak or dizzy, return to the ER for a recheck.    °

## 2016-06-20 NOTE — ED Provider Notes (Signed)
[facility] DEPT Provider Note   CSN: [id number] Arrival date & time: 06/20/16  0004   By signing my name below, I, Eunice Blase, attest that this documentation has been prepared under the direction and in the presence of [facility], PA-C. Electronically Signed: Eunice Blase, Scribe. 06/20/16. 2:10 AM.   History   Chief Complaint Chief Complaint  Patient presents with  . Sickle Cell Pain Crisis   The history is provided by the patient and medical records. No language interpreter was used.    HPI Comments: Cassie Montgomery is a 46 y.o. female BIB EMS with Hx of Sickle Cell Anemia who presents to the Emergency Department complaining of pain to the central chest wall, general abdomen and bilateral shoulders x 3-4 days. She states the chest pain is new onset on 06/19/2016 ~5 Pm, noting that it comes on with deep breathing. Pt reports associated nausea, vomiting > 10 times in the last 24 hours, dry heaving, diarrhea that has subsided, subjective fever, chills and decreased appetite d/t vomiting. Pt states she has taken prescribed hydrocodone without relief at home. She adds she was recently advised to report to Dr. Lovett Sox later today for her pain. She reveals Hx of acute chest during a past pregnancy and 2 C-sections, and she further reports that she is perimenopausal. Pt states she does not drink alcohol, and she denies vaginal discharge, vaginal bleeding, rhinorrhea, congestion and sore throat.  Past Medical History:  Diagnosis Date  . Anxiety   . Asthma in adult    probable  . Chronic pain syndrome   . Depression   . Ganglion cyst   . GERD (gastroesophageal reflux disease)   . Left arm pain    chronic  . Migraine with aura 07/15/2011  . PMDD (premenstrual dysphoric disorder)   . Sickle cell anemia (HCC)   . Sickle cell disease without crisis ([place]) 07/15/2011    Patient Active Problem List   Diagnosis Date Noted  . Menopausal syndrome 04/09/2016  . Perimenopausal symptoms  04/09/2016  . Periorbital edema 10/10/2015  . Dyslipidemia 10/10/2015  . Chronic left shoulder pain 09/06/2015  . Sickle cell anemia with pain ([place]) 02/02/2015  . Cephalalgia 10/26/2014  . Medicare annual wellness visit, initial 07/27/2014  . Vitamin D deficiency 06/28/2014  . GERD (gastroesophageal reflux disease) 06/12/2014  . Asthma in adult   . Cholecystitis, acute 06/10/2014  . Chronic prescription opiate use 01/12/2014  . Depression, major 10/07/2013  . PTSD (post-traumatic stress disorder) 10/07/2013  . Weight gain 09/27/2013  . Irregular menstrual cycle 05/12/2013  . Panic anxiety syndrome 05/11/2013  . Insomnia 05/11/2013  . Pap smear for cervical cancer screening 05/11/2013  . Nausea and vomiting in adult 01/06/2013  . Chronic pain syndrome 01/06/2013  . Depressive disorder, not elsewhere classified 10/20/2012  . Generalized anxiety disorder 10/20/2012  . Opiate analgesic contract exists 09/11/2012  . Anemia 12/07/2011    Past Surgical History:  Procedure Laterality Date  . CESAREAN SECTION     2    OB History    Gravida Para Term Preterm AB Living   3         3   SAB TAB Ectopic Multiple Live Births           3       Home Medications    Prior to Admission medications   Medication Sig Start Date End Date Taking? Authorizing Provider  acetaminophen (TYLENOL) 500 MG tablet Take 1,000 mg by mouth every 6 (six) hours  as needed for mild pain, moderate pain, fever or headache.    Yes Historical Provider, MD  albuterol (PROVENTIL HFA;VENTOLIN HFA) 108 (90 Base) MCG/ACT inhaler Inhale 1-2 puffs into the lungs every 6 (six) hours as needed for wheezing or shortness of breath (and/or cough).   Yes Historical Provider, MD  diphenhydrAMINE (BENADRYL) 25 MG tablet Take 1-2 tablets (25-50 mg total) by mouth at bedtime as needed for sleep. 10/10/15  Yes Tresa Garter, MD  escitalopram (LEXAPRO) 20 MG tablet Take 20 mg by mouth daily.   Yes Historical Provider, MD    folic acid (FOLVITE) 1 MG tablet Take 1 mg by mouth daily.   Yes Historical Provider, MD  omeprazole (PRILOSEC) 20 MG capsule Take 1 capsule (20 mg total) by mouth 2 (two) times daily before a meal. 12/12/15  Yes Ladene Artist, MD  Oxycodone HCl 10 MG TABS Take 10 mg by mouth every 4 (four) hours as needed (for pain).   Yes Historical Provider, MD    Family History Family History  Problem Relation Age of Onset  . Hypertension Mother   . Asthma Son   . Asthma Son   . Asthma Daughter     Social History Social History  Substance Use Topics  . Smoking status: Never Smoker  . Smokeless tobacco: Never Used  . Alcohol use No     Allergies   Hydrea [hydroxyurea]; Latex; Sulfa antibiotics; and Adhesive [tape]   Review of Systems Review of Systems  Constitutional: Positive for chills. Negative for appetite change.  HENT: Negative for rhinorrhea.   Gastrointestinal: Positive for abdominal pain and diarrhea.  Genitourinary: Negative for dysuria, vaginal bleeding and vaginal discharge.  Musculoskeletal: Positive for arthralgias and myalgias. Negative for back pain.     Physical Exam Updated Vital Signs BP 152/98 (BP Location: Left Arm)   Pulse 60   Temp 99.3 F (37.4 C) (Oral)   Resp 13   Ht 5\' 6"  (1.676 m)   Wt 180 lb (81.6 kg)   SpO2 99%   BMI 29.05 kg/m   Physical Exam  Constitutional: She appears well-developed and well-nourished. No distress.  HENT:  Head: Normocephalic and atraumatic.  Neck: Neck supple.  Cardiovascular: Normal rate and regular rhythm.   Pulmonary/Chest: Effort normal and breath sounds normal. No respiratory distress. She has no wheezes. She has no rales.  Abdominal: Soft. She exhibits no distension. There is generalized tenderness (mild, diffuse). There is no rebound and no guarding.  Neurological: She is alert.  Skin: She is not diaphoretic.  Nursing note and vitals reviewed.    ED Treatments / Results  DIAGNOSTIC STUDIES: Oxygen  Saturation is 99% on RA, normal by my interpretation.    COORDINATION OF CARE: 2:00 AM Discussed treatment plan with pt at bedside and pt agreed to plan. Will order labs and medications then reassess.  Labs (all labs ordered are listed, but only abnormal results are displayed) Labs Reviewed  COMPREHENSIVE METABOLIC PANEL - Abnormal; Notable for the following:       Result Value   Potassium 3.1 (*)    Glucose, Bld 113 (*)    Total Protein 8.2 (*)    Total Bilirubin 3.1 (*)    All other components within normal limits  CBC WITH DIFFERENTIAL/PLATELET - Abnormal; Notable for the following:    WBC 14.6 (*)    HCT 35.4 (*)    MCV 70.4 (*)    MCH 25.4 (*)    MCHC 36.2 (*)  RDW 16.7 (*)    Neutro Abs 11.0 (*)    Monocytes Absolute 1.1 (*)    All other components within normal limits  RETICULOCYTES - Abnormal; Notable for the following:    Retic Ct Pct 3.9 (*)    Retic Count, Manual 196.2 (*)    All other components within normal limits  LIPASE, BLOOD    EKG  EKG Interpretation  Date/Time:  Thursday June 20 2016 00:18:01 EST Ventricular Rate:  61 PR Interval:    QRS Duration: 91 QT Interval:  456 QTC Calculation: 460 R Axis:   62 Text Interpretation:  Sinus arrhythmia Ventricular premature complex Borderline T wave abnormalities Confirmed by Stark Jock  MD, DOUGLAS ([id number]) on 06/20/2016 2:34:28 AM       Radiology Dg Chest 2 View  Result Date: 06/20/2016 CLINICAL DATA:  46 y/o  F; sickle cell disease with chest pain. EXAM: CHEST  2 VIEW COMPARISON:  11/28/2015 chest radiograph FINDINGS: Stable heart size and mediastinal contours are within normal limits. Both lungs are clear. The visualized skeletal structures are unremarkable. IMPRESSION: No active cardiopulmonary disease. Electronically Signed   By: Kristine Garbe M.D.   On: 06/20/2016 02:40   Ct Angio Chest Pe W And/or Wo Contrast  Result Date: 06/20/2016 CLINICAL DATA:  Sickle cell anemia. Pleuritic chest pain  since 1700 hours. Worse with deep breathing. White cell count 14.6. EXAM: CT ANGIOGRAPHY CHEST WITH CONTRAST TECHNIQUE: Multidetector CT imaging of the chest was performed using the standard protocol during bolus administration of intravenous contrast. Multiplanar CT image reconstructions and MIPs were obtained to evaluate the vascular anatomy. CONTRAST:  100 mL Isovue 370 COMPARISON:  None. FINDINGS: Cardiovascular: Satisfactory opacification of the pulmonary arteries to the segmental level. No evidence of pulmonary embolism. Normal heart size. No pericardial effusion. Mediastinum/Nodes: No enlarged mediastinal, hilar, or axillary lymph nodes. Thyroid gland, trachea, and esophagus demonstrate no significant findings. Lungs/Pleura: Lungs are clear. No pleural effusion or pneumothorax. Upper Abdomen: No acute abnormality. Musculoskeletal: No chest wall abnormality. No acute or significant osseous findings. Review of the MIP images confirms the above findings. IMPRESSION: No evidence of significant pulmonary embolus. No evidence of active pulmonary disease. Electronically Signed   By: Lucienne Capers M.D.   On: 06/20/2016 04:29    Procedures Procedures (including critical care time)  Medications Ordered in ED Medications  HYDROmorphone (DILAUDID) injection 2 mg (not administered)    Or  HYDROmorphone (DILAUDID) injection 2 mg (not administered)  HYDROmorphone (DILAUDID) injection 2 mg (2 mg Intravenous Given 06/20/16 0424)    Or  HYDROmorphone (DILAUDID) injection 2 mg ( Subcutaneous See Alternative 06/20/16 0424)  ondansetron (ZOFRAN) injection 4 mg (not administered)  dextrose 5 %-0.45 % sodium chloride infusion ( Intravenous Stopped 06/20/16 0648)  sodium chloride 0.9 % injection (not administered)  HYDROmorphone (DILAUDID) injection 0.5 mg (0.5 mg Intravenous Given 06/20/16 0055)  ondansetron (ZOFRAN) injection 4 mg (4 mg Intravenous Given 06/20/16 0055)  HYDROmorphone (DILAUDID) injection 2 mg (2 mg  Intravenous Given 06/20/16 0213)    Or  HYDROmorphone (DILAUDID) injection 2 mg ( Subcutaneous See Alternative 06/20/16 0213)  HYDROmorphone (DILAUDID) injection 2 mg (2 mg Intravenous Given 06/20/16 0259)    Or  HYDROmorphone (DILAUDID) injection 2 mg ( Subcutaneous See Alternative 06/20/16 0259)  diphenhydrAMINE (BENADRYL) injection 25 mg (25 mg Intravenous Given 06/20/16 0210)  promethazine (PHENERGAN) injection 25 mg (25 mg Intravenous Given 06/20/16 0210)  ketorolac (TORADOL) 30 MG/ML injection 30 mg (30 mg Intravenous Given 06/20/16 0424)  iopamidol (  ISOVUE-370) 76 % injection 100 mL (100 mLs Intravenous Contrast Given 06/20/16 0408)  potassium chloride SA (K-DUR,KLOR-CON) CR tablet 40 mEq (40 mEq Oral Given 06/20/16 0516)  gi cocktail (Maalox,Lidocaine,Donnatal) (30 mLs Oral Given 06/20/16 [id number])  HYDROmorphone (DILAUDID) injection 2 mg (2 mg Intravenous Given 06/20/16 0615)     Initial Impression / Assessment and Plan / ED Course  I have reviewed the triage vital signs and the nursing notes.  Pertinent labs & imaging results that were available during my care of the patient were reviewed by me and considered in my medical decision making (see chart for details).   Afebrile nontoxic patient with N/V/D with more vomiting than diarrhea with flare of sickle cell pain including central chest pain.  I suspect the chest pain is related to the vomiting.  CT angio chest negative.  EKG nonischemic.  CXR negative.  No SOB, cough, or fever.  Suspect this is viral GI illness.   Pt feeling better after treatment. D/C home with PCP follow up.  Discussed result, findings, treatment, and follow up  with patient.  Pt given return precautions.  Pt verbalizes understanding and agrees with plan.      I personally performed the services described in this documentation, which was scribed in my presence. The recorded information has been reviewed and is accurate.   Final Clinical Impressions(s) / ED Diagnoses   Final diagnoses:   Sickle cell pain crisis ([place])  Nausea and vomiting, intractability of vomiting not specified, unspecified vomiting type    New Prescriptions Discharge Medication List as of 06/20/2016  6:42 AM       Clayton Bibles, PA-C 06/20/16 [id number]    Veryl Speak, MD 06/20/16 [phone]

## 2016-06-20 NOTE — ED Notes (Signed)
Bed: WA03 Expected date:  Expected time:  Means of arrival:  Comments: EMS 46 yo female sickle cell crisis/chest pain

## 2016-06-25 ENCOUNTER — Non-Acute Institutional Stay (HOSPITAL_COMMUNITY)
Admission: AD | Admit: 2016-06-25 | Discharge: 2016-06-25 | Disposition: A | Discharge status: Home / Self Care | Payer: Medicare Other | Source: Ambulatory Visit | Attending: Internal Medicine | Admitting: Internal Medicine

## 2016-06-25 ENCOUNTER — Telehealth: Payer: Self-pay | Admitting: Hematology

## 2016-06-25 ENCOUNTER — Other Ambulatory Visit: Payer: Self-pay | Admitting: Internal Medicine

## 2016-06-25 DIAGNOSIS — G894 Chronic pain syndrome: Secondary | ICD-10-CM | POA: Insufficient documentation

## 2016-06-25 DIAGNOSIS — R112 Nausea with vomiting, unspecified: Secondary | ICD-10-CM | POA: Insufficient documentation

## 2016-06-25 DIAGNOSIS — D5700 Hb-SS disease with crisis, unspecified: Secondary | ICD-10-CM | POA: Diagnosis not present

## 2016-06-25 DIAGNOSIS — K219 Gastro-esophageal reflux disease without esophagitis: Secondary | ICD-10-CM | POA: Insufficient documentation

## 2016-06-25 DIAGNOSIS — F419 Anxiety disorder, unspecified: Secondary | ICD-10-CM | POA: Diagnosis not present

## 2016-06-25 DIAGNOSIS — F329 Major depressive disorder, single episode, unspecified: Secondary | ICD-10-CM | POA: Diagnosis not present

## 2016-06-25 DIAGNOSIS — Z79899 Other long term (current) drug therapy: Secondary | ICD-10-CM | POA: Insufficient documentation

## 2016-06-25 DIAGNOSIS — J45909 Unspecified asthma, uncomplicated: Secondary | ICD-10-CM | POA: Diagnosis not present

## 2016-06-25 LAB — RETICULOCYTES
RBC.: 4.44 MIL/uL (ref 3.87–5.11)
Retic Count, Absolute: 182 10*3/uL (ref 19.0–186.0)
Retic Ct Pct: 4.1 % — ABNORMAL HIGH (ref 0.4–3.1)

## 2016-06-25 LAB — CBC WITH DIFFERENTIAL/PLATELET
BASOS PCT: 0 %
Basophils Absolute: 0 10*3/uL (ref 0.0–0.1)
EOS PCT: 1 %
Eosinophils Absolute: 0.1 10*3/uL (ref 0.0–0.7)
HCT: 31.8 % — ABNORMAL LOW (ref 36.0–46.0)
Hemoglobin: 11.4 g/dL — ABNORMAL LOW (ref 12.0–15.0)
LYMPHS ABS: 1.7 10*3/uL (ref 0.7–4.0)
Lymphocytes Relative: 19 %
MCH: 25.7 pg — AB (ref 26.0–34.0)
MCHC: 35.8 g/dL (ref 30.0–36.0)
MCV: 71.6 fL — AB (ref 78.0–100.0)
MONO ABS: 0.5 10*3/uL (ref 0.1–1.0)
Monocytes Relative: 5 %
NEUTROS PCT: 75 %
Neutro Abs: 6.9 10*3/uL (ref 1.7–7.7)
PLATELETS: 324 10*3/uL (ref 150–400)
RBC: 4.44 MIL/uL (ref 3.87–5.11)
RDW: 19.8 % — ABNORMAL HIGH (ref 11.5–15.5)
WBC: 9.2 10*3/uL (ref 4.0–10.5)

## 2016-06-25 LAB — URINALYSIS, ROUTINE W REFLEX MICROSCOPIC
Bilirubin Urine: NEGATIVE
Glucose, UA: NEGATIVE mg/dL
HGB URINE DIPSTICK: NEGATIVE
Ketones, ur: NEGATIVE mg/dL
LEUKOCYTES UA: NEGATIVE
Nitrite: NEGATIVE
Protein, ur: NEGATIVE mg/dL
SPECIFIC GRAVITY, URINE: 1.008 (ref 1.005–1.030)
pH: 6 (ref 5.0–8.0)

## 2016-06-25 MED ORDER — ESCITALOPRAM OXALATE 20 MG PO TABS: 20.0000 mg | ORAL_TABLET | Freq: Every day | ORAL | 3 refills | Status: DC

## 2016-06-25 MED ORDER — POLYETHYLENE GLYCOL 3350 17 G PO PACK: 17.0000 g | PACK | Freq: Every day | ORAL | Status: DC | PRN

## 2016-06-25 MED ORDER — KETOROLAC TROMETHAMINE 30 MG/ML IJ SOLN
30.0000 mg | Freq: Four times a day (QID) | INTRAMUSCULAR | Status: DC
  Administered 2016-06-25: 30 mg via INTRAVENOUS
  Filled 2016-06-25: qty 1

## 2016-06-25 MED ORDER — DIPHENHYDRAMINE HCL 25 MG PO CAPS
25.0000 mg | ORAL_CAPSULE | ORAL | Status: DC | PRN
  Administered 2016-06-25: 25 mg via ORAL
  Filled 2016-06-25: qty 1

## 2016-06-25 MED ORDER — SODIUM CHLORIDE 0.9% FLUSH: 9.0000 mL | INTRAVENOUS | Status: DC | PRN

## 2016-06-25 MED ORDER — DEXTROSE-NACL 5-0.45 % IV SOLN
INTRAVENOUS | Status: DC
  Administered 2016-06-25: 12:00:00 via INTRAVENOUS

## 2016-06-25 MED ORDER — PROMETHAZINE HCL 25 MG PO TABS: 25.0000 mg | ORAL_TABLET | Freq: Three times a day (TID) | ORAL | 2 refills | Status: DC | PRN

## 2016-06-25 MED ORDER — OXYCODONE HCL 15 MG PO TABS: 15.0000 mg | ORAL_TABLET | ORAL | 0 refills | Status: DC | PRN

## 2016-06-25 MED ORDER — SODIUM CHLORIDE 0.9 % IV SOLN
25.0000 mg | INTRAVENOUS | Status: DC | PRN
  Filled 2016-06-25: qty 0.5

## 2016-06-25 MED ORDER — HYDROMORPHONE 1 MG/ML IV SOLN
INTRAVENOUS | Status: DC
  Administered 2016-06-25: 14.7 mg via INTRAVENOUS
  Administered 2016-06-25: 12:00:00 via INTRAVENOUS
  Filled 2016-06-25: qty 25

## 2016-06-25 MED ORDER — ONDANSETRON HCL 4 MG/2ML IJ SOLN
4.0000 mg | Freq: Four times a day (QID) | INTRAMUSCULAR | Status: DC | PRN
  Administered 2016-06-25: 4 mg via INTRAVENOUS
  Filled 2016-06-25: qty 2

## 2016-06-25 MED ORDER — SENNOSIDES-DOCUSATE SODIUM 8.6-50 MG PO TABS
1.0000 | ORAL_TABLET | Freq: Two times a day (BID) | ORAL | Status: DC
  Filled 2016-06-25: qty 1

## 2016-06-25 MED ORDER — NALOXONE HCL 0.4 MG/ML IJ SOLN: 0.4000 mg | INTRAMUSCULAR | Status: DC | PRN

## 2016-06-25 NOTE — Telephone Encounter (Signed)
Reached patient and advised she can come to [facility].

## 2016-06-25 NOTE — Progress Notes (Signed)
Patient  received at the [facility] for treatment. Pt states her pain is generalized and rates it 6/10 on pain scale. Patient was treated with IV fluids, Dilaudid PCA and Toradol. Patient's pain was 5/10 at time of  discharge. Patient was alert,oriented and ambulatory at discharge. D/C instructions given with verbal understanding.

## 2016-06-25 NOTE — Telephone Encounter (Signed)
Patient C/O pain to stomach and back that is 6/10 on pain scale. Patient states the pain has been going on for approx. 1 week.  She has been to ED on thrusday and pain still not controlled.  Patient denies N/V/D however, she had some vomiting 2-3 days ago.  Patient denies shortness of breath or chest pain.  I advised I would discuss with provider and call her back.

## 2016-06-25 NOTE — H&P (Signed)
[facility] History and Physical  Cassie Montgomery [id number] DOB: August 05, 1970 DOA: 06/25/2016  PCP: Dorena Dew, FNP   Chief Complaint: Sickle Cell Pain  HPI: Cassie Montgomery is a 46 y.o. female with history of sickle cell disease who presents to the day hospital today complaining of pain to the central chest wall, general abdomen and bilateral shoulders for few days. The abdominal pain is chronic, has been worked up by GI specialist with upper endoscopy and colonoscopy, no significant finding to suggest origin or cause of pain. Patient reports associated nausea, vomiting, dry heaving. She gets relieve after vomiting but she is tired of vomiting to get relief from her pain. There is no fever or chills. She complained of losing so much weight in the last few months. Patient states she has taken prescribed hydrocodone without relief at home. Patient states she does not drink alcohol, and she denies vaginal discharge, vaginal bleeding, rhinorrhea, congestion and sore throat. She denies any urinary symptom.  Systemic Review: General: The patient denies anorexia, fever,  Cardiac: Denies  syncope, palpitations, pedal edema  Respiratory: Denies cough, shortness of breath, wheezing GU: Denies hematuria, incontinence, dysuria  Musculoskeletal: Denies arthritis  Skin: Denies suspicious skin lesions Neurologic: Denies focal weakness or numbness, change in vision  Past Medical History:  Diagnosis Date  . Anxiety   . Asthma in adult    probable  . Chronic pain syndrome   . Depression   . Ganglion cyst   . GERD (gastroesophageal reflux disease)   . Left arm pain    chronic  . Migraine with aura 07/15/2011  . PMDD (premenstrual dysphoric disorder)   . Sickle cell anemia (HCC)   . Sickle cell disease without crisis ([place]) 07/15/2011    Past Surgical History:  Procedure Laterality Date  . CESAREAN SECTION     2    Allergies  Allergen Reactions  . Hydrea [Hydroxyurea] Nausea  Only  . Latex Swelling and Other (See Comments)    Reaction:  Localized swelling   . Sulfa Antibiotics Other (See Comments)    Reaction:  Increased bilirubin   . Adhesive [Tape] Rash   Family History  Problem Relation Age of Onset  . Hypertension Mother   . Asthma Son   . Asthma Son   . Asthma Daughter     Prior to Admission medications   Medication Sig Start Date End Date Taking? Authorizing Provider  acetaminophen (TYLENOL) 500 MG tablet Take 1,000 mg by mouth every 6 (six) hours as needed for mild pain, moderate pain, fever or headache.    Yes Historical Provider, MD  diphenhydrAMINE (BENADRYL) 25 MG tablet Take 1-2 tablets (25-50 mg total) by mouth at bedtime as needed for sleep. 10/10/15  Yes Tresa Garter, MD  escitalopram (LEXAPRO) 20 MG tablet Take 20 mg by mouth daily.   Yes Historical Provider, MD  folic acid (FOLVITE) 1 MG tablet Take 1 mg by mouth daily.   Yes Historical Provider, MD  omeprazole (PRILOSEC) 20 MG capsule Take 1 capsule (20 mg total) by mouth 2 (two) times daily before a meal. 12/12/15  Yes Ladene Artist, MD  Oxycodone HCl 10 MG TABS Take 10 mg by mouth every 4 (four) hours as needed (for pain).   Yes Historical Provider, MD  albuterol (PROVENTIL HFA;VENTOLIN HFA) 108 (90 Base) MCG/ACT inhaler Inhale 1-2 puffs into the lungs every 6 (six) hours as needed for wheezing or shortness of breath (and/or cough).    Historical Provider, MD  Physical Exam: Vitals:   06/25/16 1102  BP: (!) 145/96  Pulse: 88  Resp: 18  Temp: 99 F (37.2 C)  TempSrc: Oral  SpO2: 100%  Weight: 175 lb (79.4 kg)  Height: 5\' 6"  (1.676 m)   General: Alert, awake, afebrile, anicteric, not in obvious distress HEENT: Normocephalic and Atraumatic, Mucous membranes pink                PERRLA; EOM intact; No scleral icterus,                 Nares: Patent, Oropharynx: Clear, Fair Dentition                 Neck: FROM, no cervical lymphadenopathy, thyromegaly, carotid bruit or JVD;   CHEST WALL: No tenderness  CHEST: Normal respiration, clear to auscultation bilaterally  HEART: Regular rate and rhythm; no murmurs rubs or gallops  BACK: No kyphosis or scoliosis; no CVA tenderness  ABDOMEN: Positive Bowel Sounds, soft, non-tender; no masses, no organomegaly EXTREMITIES: No cyanosis, clubbing, or edema SKIN:  no rash or ulceration  CNS: Alert and Oriented x 4, Nonfocal exam, CN 2-12 intact  Labs on Admission:  Basic Metabolic Panel:  Recent Labs Lab 06/20/16 0011  NA 139  K 3.1*  CL 102  CO2 26  GLUCOSE 113*  BUN 17  CREATININE 0.89  CALCIUM 9.7   Liver Function Tests:  Recent Labs Lab 06/20/16 0011  AST 24  ALT 19  ALKPHOS 66  BILITOT 3.1*  PROT 8.2*  ALBUMIN 5.0    Recent Labs Lab 06/20/16 0014  LIPASE 13   No results for input(s): AMMONIA in the last 168 hours. CBC:  Recent Labs Lab 06/20/16 0011  WBC 14.6*  NEUTROABS 11.0*  HGB 12.8  HCT 35.4*  MCV 70.4*  PLT 166   Cardiac Enzymes: No results for input(s): CKTOTAL, CKMB, CKMBINDEX, TROPONINI in the last 168 hours.  BNP (last 3 results) No results for input(s): BNP in the last 8760 hours.  ProBNP (last 3 results) No results for input(s): PROBNP in the last 8760 hours.  CBG: No results for input(s): GLUCAP in the last 168 hours.  Assessment/Plan Active Problems:   Sickle cell anemia with pain ([place])   Admits to the Day Hospital  IVF D5 .45% Saline @ 125 mls/hour  Weight based Dilaudid PCA started within 30 minutes of admission  IV Toradol 30 mg Q 6 H  Monitor vitals very closely, Re-evaluate pain scale every hour  2 L of Oxygen by [place]  Patient will be re-evaluated for pain in the context of function and relationship to baseline as care progresses.  If no significant relieve from pain (remains above 5/10) will transfer patient to inpatient services for further evaluation and management  Code Status: Full  Family Communication: None  DVT Prophylaxis:  Ambulate as tolerated   Time spent: 66 Minutes  Aleksei Goodlin, MD, MHA, FACP, FAAP, CPE  If 7PM-7AM, please contact night-coverage [email or web address] 06/25/2016, 11:13 AM

## 2016-06-25 NOTE — Telephone Encounter (Signed)
Attempted to call  patient back but there is no answer.  Phone just rings, no voicemail picks up.  If patient calls back, per Dr. Doreene Burke [facility].

## 2016-06-25 NOTE — Discharge Summary (Signed)
Physician Discharge Summary  Darinka Marcello [id number] DOB: 25-Oct-1970 DOA: 06/25/2016  PCP: Dorena Dew, FNP  Admit date: 06/25/2016  Discharge date: 06/25/2016  Time spent: 30 minutes  Discharge Diagnoses:  Active Problems:   Sickle cell anemia with pain [facility])  Discharge Condition: Stable  Diet recommendation: Regular  Filed Weights   06/25/16 1102  Weight: 175 lb (79.4 kg)   History of present illness:  Cassie Montgomery is a 46 y.o. female with history of sickle cell disease who presents to the day hospital today complaining of pain to the central chest wall, general abdomen and bilateral shoulders for few days. The abdominal pain is chronic, has been worked up by GI specialist with upper endoscopy and colonoscopy, no significant finding to suggest origin or cause of pain. Patient reports associated nausea, vomiting, dry heaving. She gets relieve after vomiting but she is tired of vomiting to get relief from her pain. There is no fever or chills. She complained of losing so much weight in the last few months. Patient states she has taken prescribed hydrocodone without relief at home. Patient states she does not drink alcohol, and she denies vaginal discharge, vaginal bleeding, rhinorrhea, congestion and sore throat. She denies any urinary symptom.  Hospital Course:  Claudine Ping was admitted to the day hospital with sickle cell painful crisis. Patient was treated with weight based IV Dilaudid PCA, IV Toradol as well as IV fluids. Viktoria showed significant improvement symptomatically, pain improved from 10 to 5/10 at the time of discharge. Patient was discharged home in a hemodynamically stable condition. Annise will follow-up at the clinic as previously scheduled, continue with home medications as per prior to admission.  Discharge Instructions We discussed the need for good hydration, monitoring of hydration status, avoidance of heat, cold, stress, and infection triggers.  We discussed the need to be compliant with taking Hydrea. Anwar was reminded of the need to seek medical attention of any symptoms of bleeding, anemia, or infection occurs.  Discharge Exam: Vitals:   06/25/16 1307 06/25/16 1408  BP: 114/75 132/87  Pulse: 66 86  Resp: 10 12  Temp:     General appearance: alert, cooperative and no distress Eyes: conjunctivae/corneas clear. PERRL, EOM's intact. Fundi benign. Neck: no adenopathy, no carotid bruit, no JVD, supple, symmetrical, trachea midline and thyroid not enlarged, symmetric, no tenderness/mass/nodules Back: symmetric, no curvature. ROM normal. No CVA tenderness. Resp: clear to auscultation bilaterally Chest wall: no tenderness Cardio: regular rate and rhythm, S1, S2 normal, no murmur, click, rub or gallop GI: soft, non-tender; bowel sounds normal; no masses, no organomegaly Extremities: extremities normal, atraumatic, no cyanosis or edema Pulses: 2+ and symmetric Skin: Skin color, texture, turgor normal. No rashes or lesions Neurologic: Grossly normal  Discharge Instructions    Diet - low sodium heart healthy    Complete by:  As directed    Increase activity slowly    Complete by:  As directed      Current Discharge Medication List    START taking these medications   Details  escitalopram (LEXAPRO) 20 MG tablet Take 1 tablet (20 mg total) by mouth daily. Qty: 30 tablet, Refills: 3    oxyCODONE (ROXICODONE) 15 MG immediate release tablet Take 1 tablet (15 mg total) by mouth every 4 (four) hours as needed for pain. Qty: 90 tablet, Refills: 0    promethazine (PHENERGAN) 25 MG tablet Take 1 tablet (25 mg total) by mouth every 8 (eight) hours as needed for nausea or vomiting. Qty: 60  tablet, Refills: 2      CONTINUE these medications which have NOT CHANGED   Details  acetaminophen (TYLENOL) 500 MG tablet Take 1,000 mg by mouth every 6 (six) hours as needed for mild pain, moderate pain, fever or headache.      diphenhydrAMINE (BENADRYL) 25 MG tablet Take 1-2 tablets (25-50 mg total) by mouth at bedtime as needed for sleep. Qty: 60 tablet, Refills: 2    folic acid (FOLVITE) 1 MG tablet Take 1 mg by mouth daily.    omeprazole (PRILOSEC) 20 MG capsule Take 1 capsule (20 mg total) by mouth 2 (two) times daily before a meal. Qty: 60 capsule, Refills: 11   Associated Diagnoses: Abdominal pain, epigastric; Non-intractable vomiting with nausea, unspecified vomiting type    albuterol (PROVENTIL HFA;VENTOLIN HFA) 108 (90 Base) MCG/ACT inhaler Inhale 1-2 puffs into the lungs every 6 (six) hours as needed for wheezing or shortness of breath (and/or cough).       Allergies  Allergen Reactions  . Hydrea [Hydroxyurea] Nausea Only  . Latex Swelling and Other (See Comments)    Reaction:  Localized swelling   . Sulfa Antibiotics Other (See Comments)    Reaction:  Increased bilirubin   . Adhesive [Tape] Rash    Significant Diagnostic Studies: Dg Chest 2 View  Result Date: 06/20/2016 CLINICAL DATA:  46 y/o  F; sickle cell disease with chest pain. EXAM: CHEST  2 VIEW COMPARISON:  11/28/2015 chest radiograph FINDINGS: Stable heart size and mediastinal contours are within normal limits. Both lungs are clear. The visualized skeletal structures are unremarkable. IMPRESSION: No active cardiopulmonary disease. Electronically Signed   By: Kristine Garbe M.D.   On: 06/20/2016 02:40   Ct Angio Chest Pe W And/or Wo Contrast  Result Date: 06/20/2016 CLINICAL DATA:  Sickle cell anemia. Pleuritic chest pain since 1700 hours. Worse with deep breathing. White cell count 14.6. EXAM: CT ANGIOGRAPHY CHEST WITH CONTRAST TECHNIQUE: Multidetector CT imaging of the chest was performed using the standard protocol during bolus administration of intravenous contrast. Multiplanar CT image reconstructions and MIPs were obtained to evaluate the vascular anatomy. CONTRAST:  100 mL Isovue 370 COMPARISON:  None. FINDINGS:  Cardiovascular: Satisfactory opacification of the pulmonary arteries to the segmental level. No evidence of pulmonary embolism. Normal heart size. No pericardial effusion. Mediastinum/Nodes: No enlarged mediastinal, hilar, or axillary lymph nodes. Thyroid gland, trachea, and esophagus demonstrate no significant findings. Lungs/Pleura: Lungs are clear. No pleural effusion or pneumothorax. Upper Abdomen: No acute abnormality. Musculoskeletal: No chest wall abnormality. No acute or significant osseous findings. Review of the MIP images confirms the above findings. IMPRESSION: No evidence of significant pulmonary embolus. No evidence of active pulmonary disease. Electronically Signed   By: Lucienne Capers M.D.   On: 06/20/2016 04:29    Signed:  Angelica Chessman MD, [place], [place], [place], CPE   06/25/2016, 3:26 PM

## 2016-07-08 ENCOUNTER — Telehealth: Payer: Self-pay

## 2016-07-09 ENCOUNTER — Other Ambulatory Visit: Payer: Self-pay | Admitting: Internal Medicine

## 2016-07-09 MED ORDER — OXYCODONE HCL 15 MG PO TABS: 15.0000 mg | ORAL_TABLET | ORAL | 0 refills | Status: DC | PRN

## 2016-07-18 ENCOUNTER — Telehealth: Payer: Self-pay

## 2016-07-18 ENCOUNTER — Other Ambulatory Visit: Payer: Self-pay | Admitting: Internal Medicine

## 2016-07-18 MED ORDER — ESCITALOPRAM OXALATE 20 MG PO TABS: 20.0000 mg | ORAL_TABLET | Freq: Every day | ORAL | 3 refills | Status: DC

## 2016-07-18 MED ORDER — MORPHINE SULFATE 15 MG PO TABS: 15.0000 mg | ORAL_TABLET | ORAL | 0 refills | Status: DC | PRN

## 2016-07-23 ENCOUNTER — Telehealth: Payer: Self-pay

## 2016-07-23 ENCOUNTER — Other Ambulatory Visit: Payer: Self-pay | Admitting: Internal Medicine

## 2016-07-23 MED ORDER — OXYCODONE HCL 15 MG PO TABS: 15.0000 mg | ORAL_TABLET | ORAL | 0 refills | Status: DC | PRN

## 2016-08-12 ENCOUNTER — Telehealth: Payer: Self-pay

## 2016-08-12 ENCOUNTER — Other Ambulatory Visit: Payer: Self-pay | Admitting: Internal Medicine

## 2016-08-12 MED ORDER — MORPHINE SULFATE 15 MG PO TABS: 15.0000 mg | ORAL_TABLET | ORAL | 0 refills | Status: DC | PRN

## 2016-08-18 ENCOUNTER — Telehealth: Payer: Self-pay

## 2016-08-20 ENCOUNTER — Other Ambulatory Visit: Payer: Self-pay | Admitting: Internal Medicine

## 2016-08-20 MED ORDER — OXYCODONE HCL 15 MG PO TABS: 15.0000 mg | ORAL_TABLET | ORAL | 0 refills | Status: AC | PRN

## 2016-08-26 ENCOUNTER — Telehealth: Payer: Self-pay

## 2016-08-27 ENCOUNTER — Other Ambulatory Visit: Payer: Self-pay | Admitting: Family Medicine

## 2016-08-27 ENCOUNTER — Telehealth: Payer: Self-pay

## 2016-08-28 ENCOUNTER — Other Ambulatory Visit: Payer: Self-pay | Admitting: Internal Medicine

## 2016-08-28 MED ORDER — MORPHINE SULFATE 15 MG PO TABS: 15.0000 mg | ORAL_TABLET | ORAL | 0 refills | Status: DC | PRN

## 2016-09-02 ENCOUNTER — Telehealth: Payer: Self-pay

## 2016-09-06 ENCOUNTER — Telehealth: Payer: Self-pay

## 2016-09-06 NOTE — Telephone Encounter (Signed)
Patient is calling again regarding her refill on Oxycodone 15mg . Patient is aware that Dr. Doreene Burke is out of office.

## 2016-09-09 ENCOUNTER — Encounter: Payer: Self-pay | Admitting: Family Medicine

## 2016-09-09 ENCOUNTER — Non-Acute Institutional Stay (HOSPITAL_BASED_OUTPATIENT_CLINIC_OR_DEPARTMENT_OTHER)
Admission: AD | Admit: 2016-09-09 | Discharge: 2016-09-09 | Disposition: A | Discharge status: Home / Self Care | Payer: Medicare Other | Source: Ambulatory Visit | Attending: Internal Medicine | Admitting: Internal Medicine

## 2016-09-09 ENCOUNTER — Ambulatory Visit (INDEPENDENT_AMBULATORY_CARE_PROVIDER_SITE_OTHER): Payer: Medicare Other | Admitting: Family Medicine

## 2016-09-09 VITALS — BP 150/82 | HR 74 | Temp 98.6°F | Resp 14 | Ht 66.0 in | Wt 196.0 lb

## 2016-09-09 DIAGNOSIS — F329 Major depressive disorder, single episode, unspecified: Secondary | ICD-10-CM

## 2016-09-09 DIAGNOSIS — Z79899 Other long term (current) drug therapy: Secondary | ICD-10-CM

## 2016-09-09 DIAGNOSIS — G894 Chronic pain syndrome: Secondary | ICD-10-CM | POA: Insufficient documentation

## 2016-09-09 DIAGNOSIS — K219 Gastro-esophageal reflux disease without esophagitis: Secondary | ICD-10-CM

## 2016-09-09 DIAGNOSIS — E876 Hypokalemia: Secondary | ICD-10-CM | POA: Diagnosis not present

## 2016-09-09 DIAGNOSIS — Z79891 Long term (current) use of opiate analgesic: Secondary | ICD-10-CM

## 2016-09-09 DIAGNOSIS — D5700 Hb-SS disease with crisis, unspecified: Secondary | ICD-10-CM | POA: Diagnosis not present

## 2016-09-09 DIAGNOSIS — E860 Dehydration: Secondary | ICD-10-CM | POA: Diagnosis not present

## 2016-09-09 DIAGNOSIS — R809 Proteinuria, unspecified: Secondary | ICD-10-CM | POA: Diagnosis not present

## 2016-09-09 DIAGNOSIS — D57219 Sickle-cell/Hb-C disease with crisis, unspecified: Secondary | ICD-10-CM | POA: Insufficient documentation

## 2016-09-09 DIAGNOSIS — J45909 Unspecified asthma, uncomplicated: Secondary | ICD-10-CM | POA: Diagnosis not present

## 2016-09-09 DIAGNOSIS — R112 Nausea with vomiting, unspecified: Secondary | ICD-10-CM | POA: Diagnosis not present

## 2016-09-09 LAB — COMPREHENSIVE METABOLIC PANEL
ALT: 23 U/L (ref 14–54)
AST: 26 U/L (ref 15–41)
Albumin: 4.1 g/dL (ref 3.5–5.0)
Alkaline Phosphatase: 65 U/L (ref 38–126)
Anion gap: 8 (ref 5–15)
BILIRUBIN TOTAL: 1.9 mg/dL — AB (ref 0.3–1.2)
BUN: 8 mg/dL (ref 6–20)
CO2: 30 mmol/L (ref 22–32)
Calcium: 8.8 mg/dL — ABNORMAL LOW (ref 8.9–10.3)
Chloride: 101 mmol/L (ref 101–111)
Creatinine, Ser: 0.57 mg/dL (ref 0.44–1.00)
GFR calc Af Amer: >60 mL/min (ref >60)
GFR calc non Af Amer: >60 mL/min (ref >60)
GLUCOSE: 103 mg/dL — AB (ref 65–99)
POTASSIUM: 2.5 mmol/L — AB (ref 3.5–5.1)
Sodium: 139 mmol/L (ref 135–145)
TOTAL PROTEIN: 7.3 g/dL (ref 6.5–8.1)

## 2016-09-09 LAB — CBC WITH DIFFERENTIAL/PLATELET
Basophils Absolute: 0 10*3/uL (ref 0.0–0.1)
Basophils Relative: 0 %
EOS PCT: 1 %
Eosinophils Absolute: 0.1 10*3/uL (ref 0.0–0.7)
HCT: 31.2 % — ABNORMAL LOW (ref 36.0–46.0)
Hemoglobin: 10.5 g/dL — ABNORMAL LOW (ref 12.0–15.0)
LYMPHS PCT: 26 %
Lymphs Abs: 1.9 10*3/uL (ref 0.7–4.0)
MCH: 25.2 pg — ABNORMAL LOW (ref 26.0–34.0)
MCHC: 33.7 g/dL (ref 30.0–36.0)
MCV: 74.8 fL — ABNORMAL LOW (ref 78.0–100.0)
Monocytes Absolute: 0.5 10*3/uL (ref 0.1–1.0)
Monocytes Relative: 6 %
Neutro Abs: 4.8 10*3/uL (ref 1.7–7.7)
Neutrophils Relative %: 66 %
PLATELETS: 129 10*3/uL — AB (ref 150–400)
RBC: 4.17 MIL/uL (ref 3.87–5.11)
RDW: 17.9 % — ABNORMAL HIGH (ref 11.5–15.5)
WBC: 7.3 10*3/uL (ref 4.0–10.5)

## 2016-09-09 LAB — URINALYSIS, COMPLETE (UACMP) WITH MICROSCOPIC
BACTERIA UA: NONE SEEN
Bilirubin Urine: NEGATIVE
Glucose, UA: NEGATIVE mg/dL
HGB URINE DIPSTICK: NEGATIVE
Ketones, ur: NEGATIVE mg/dL
Leukocytes, UA: NEGATIVE
NITRITE: NEGATIVE
PROTEIN: 100 mg/dL — AB
Specific Gravity, Urine: 1.009 (ref 1.005–1.030)
pH: 7 (ref 5.0–8.0)

## 2016-09-09 LAB — RETICULOCYTES
RBC.: 4.17 MIL/uL (ref 3.87–5.11)
RETIC COUNT ABSOLUTE: 187.7 10*3/uL — AB (ref 19.0–186.0)
Retic Ct Pct: 4.5 % — ABNORMAL HIGH (ref 0.4–3.1)

## 2016-09-09 LAB — PREGNANCY, URINE: Preg Test, Ur: NEGATIVE

## 2016-09-09 MED ORDER — DIPHENHYDRAMINE HCL 25 MG PO CAPS
25.0000 mg | ORAL_CAPSULE | ORAL | Status: DC | PRN
  Administered 2016-09-09: 50 mg via ORAL
  Filled 2016-09-09: qty 2

## 2016-09-09 MED ORDER — NALOXONE HCL 0.4 MG/ML IJ SOLN: 0.4000 mg | INTRAMUSCULAR | Status: DC | PRN

## 2016-09-09 MED ORDER — HYDROMORPHONE HCL 4 MG/ML IJ SOLN: 2.0000 mg | Freq: Once | INTRAMUSCULAR | Status: DC

## 2016-09-09 MED ORDER — KETOROLAC TROMETHAMINE 30 MG/ML IJ SOLN
15.0000 mg | Freq: Once | INTRAMUSCULAR | Status: AC
Start: 2016-09-09 — End: 2016-09-09
  Administered 2016-09-09: 15 mg via INTRAVENOUS
  Filled 2016-09-09: qty 1

## 2016-09-09 MED ORDER — HYDROMORPHONE 1 MG/ML IV SOLN
INTRAVENOUS | Status: DC
  Administered 2016-09-09: 11:00:00 via INTRAVENOUS
  Administered 2016-09-09: 10.5 mg via INTRAVENOUS
  Filled 2016-09-09: qty 25

## 2016-09-09 MED ORDER — ONDANSETRON HCL 4 MG/2ML IJ SOLN: 4.0000 mg | Freq: Four times a day (QID) | INTRAMUSCULAR | Status: DC | PRN

## 2016-09-09 MED ORDER — DEXTROSE-NACL 5-0.45 % IV SOLN
INTRAVENOUS | Status: DC
  Administered 2016-09-09: 11:00:00 via INTRAVENOUS

## 2016-09-09 MED ORDER — POTASSIUM CHLORIDE CRYS ER 20 MEQ PO TBCR
20.0000 meq | EXTENDED_RELEASE_TABLET | Freq: Once | ORAL | Status: AC
  Administered 2016-09-09: 20 meq via ORAL
  Filled 2016-09-09: qty 1

## 2016-09-09 MED ORDER — OXYCODONE HCL ER 10 MG PO T12A: 10.0000 mg | EXTENDED_RELEASE_TABLET | Freq: Two times a day (BID) | ORAL | 0 refills | Status: DC

## 2016-09-09 MED ORDER — POTASSIUM CHLORIDE CRYS ER 20 MEQ PO TBCR: 40.0000 meq | EXTENDED_RELEASE_TABLET | Freq: Once | ORAL | Status: DC

## 2016-09-09 MED ORDER — KCL IN DEXTROSE-NACL 20-5-0.45 MEQ/L-%-% IV SOLN
INTRAVENOUS | Status: DC
  Administered 2016-09-09: 13:00:00 via INTRAVENOUS
  Filled 2016-09-09: qty 1000

## 2016-09-09 MED ORDER — SODIUM CHLORIDE 0.9% FLUSH: 9.0000 mL | INTRAVENOUS | Status: DC | PRN

## 2016-09-09 MED ORDER — SODIUM CHLORIDE 0.9 % IV SOLN
25.0000 mg | INTRAVENOUS | Status: DC | PRN
  Filled 2016-09-09: qty 0.5

## 2016-09-09 NOTE — Progress Notes (Signed)
Subjective:    Patient ID: Cassie Montgomery, female    DOB: [birth year], 46 y.o.   MRN: [id number]  HPI  Cassie Montgomery, a 46 year old female with a history of sickle cell anemia, HbSC and chronic pain presents for medication management. She says that pain intensity has been increased over the past several days. She has not identified any palliative or provocative factors contributing to pain. She says that pain intensity is 7/10 primarily to right leg, back and abdomen, which is consistent with sickle cell crisis. She says that she last had MS IR this am around 7 with minimal relief. She has been hydrating and resting. She endorses fatigue. She denies shortness of breath, chest pain, paresthesias, dysuria, nausea, vomiting, or diarrhea.  Past Medical History:  Diagnosis Date  . Anxiety   . Asthma in adult    probable  . Chronic pain syndrome   . Depression   . Ganglion cyst   . GERD (gastroesophageal reflux disease)   . Left arm pain    chronic  . Migraine with aura 07/15/2011  . PMDD (premenstrual dysphoric disorder)   . Sickle cell anemia (HCC)   . Sickle cell disease without crisis ([place]) 07/15/2011   Social History   Social History  . Marital status: Single    Spouse name: N/A  . Number of children: 3  . Years of education: N/A   Occupational History  . Not on file.   Social History Main Topics  . Smoking status: Never Smoker  . Smokeless tobacco: Never Used  . Alcohol use No  . Drug use: No  . Sexual activity: Yes    Birth control/ protection: None   Other Topics Concern  . Not on file   Social History Narrative   Uses a cane occasionally.  Does not work.     Review of Systems  Constitutional: Positive for fatigue.  HENT: Negative.   Eyes: Negative.  Negative for photophobia.  Respiratory: Negative.  Negative for cough.   Cardiovascular: Negative for chest pain, palpitations and leg swelling.  Gastrointestinal: Positive for abdominal pain.  Endocrine:  Negative.  Negative for polydipsia, polyphagia and polyuria.  Genitourinary: Negative.   Musculoskeletal: Negative.   Skin: Negative.   Hematological: Negative.   Psychiatric/Behavioral: The patient is hyperactive.        Objective:   Physical Exam  HENT:  Head: Normocephalic and atraumatic.  Right Ear: External ear normal.  Left Ear: External ear normal.  Nose: Nose normal.  Mouth/Throat: Oropharynx is clear and moist.  Eyes: Conjunctivae and EOM are normal. Pupils are equal, round, and reactive to light.  Neck: Normal range of motion. Neck supple.  Cardiovascular: Normal rate, regular rhythm, normal heart sounds and intact distal pulses.   Pulmonary/Chest: Effort normal and breath sounds normal.  Abdominal: She exhibits no distension. There is tenderness.  Musculoskeletal:       Right knee: She exhibits decreased range of motion and swelling.       Lumbar back: She exhibits decreased range of motion and tenderness.  Psychiatric: Her speech is normal and behavior is normal. Judgment and thought content normal.      BP (!) 150/82 (BP Location: Right Arm, Patient Position: Sitting, Cuff Size: Large) Comment: manual  Pulse 74   Temp 98.6 F (37 C) (Oral)   Resp 14   Ht 5\' 6"  (1.676 m)   Wt 196 lb (88.9 kg)   LMP 07/11/2016   SpO2 100%   BMI  31.64 kg/m  Assessment & Plan:  1. Sickle cell anemia with pain ([place]) Cassie Montgomery will transition to the day infusion center for extended observation.   Patient will be admitted to the day infusion center for extended observation  Start IV D5.45 for cellular rehydration at 125/hr  Start Toradol 15 mg IV times 1 for inflammation.   Start Dilaudid PCA High Concentration per weight based protocol.   Patient will be re-evaluated for pain intensity in the context of function and relationship to baseline as care progresses.  If no significant pain relief, will transfer patient to inpatient services for a higher level of care.    Will check CMP, reticulocytes, and CBC w/differential   Sickle cell disease - Continue folic acid 1 mg daily to prevent aplastic bone marrow crises.   Pulmonary evaluation - Patient denies severe recurrent wheezes, shortness of breath with exercise, or persistent cough. If these symptoms develop, pulmonary function tests with spirometry will be ordered, and if abnormal, plan on referral to Pulmonology for further evaluation.  Cardiac - Routine screening for pulmonary hypertension is not recommended.  Eye - High risk of proliferative retinopathy. Annual eye exam with retinal exam recommended to patient.   Immunization status - Vaccinations up to date   Acute and chronic painful episodes - We agreed on Oxycontin 10 mg BID #60. We discussed that pt is to receive her Schedule II prescriptions only from [place]. Pt is also aware that the prescription history is available to [place] online through the [facility] CSRS. Controlled substance agreement signed previously. We reminded Cassie Montgomery that all patients receiving Schedule II narcotics must be seen for follow within one month of prescription being requested. We reviewed the terms of our pain agreement, including the need to keep medicines in a safe locked location away from children or pets, and the need to report excess sedation or constipation, measures to avoid constipation, and policies related to early refills and stolen prescriptions. According to the [place] Chronic [employer] program, we have reviewed details related to analgesia, adverse effects, aberrant behaviors. Reviewed [place] Substance Reporting system prior to prescribing opiate medications. No inconsistencies noted.   - oxyCODONE (OXYCONTIN) 10 mg 12 hr tablet; Take 1 tablet (10 mg total) by mouth every 12 (twelve) hours.  Dispense: 60 tablet; Refill: 0  2. Chronic prescription opiate use - Pain Mgmt, Profile 8 w/Conf, U    RTC: 1 month for medication management   Donia Pounds  MSN,  FNP-C [facility] [place], [place] [id number] [phone]

## 2016-09-09 NOTE — H&P (Signed)
[facility] History and Physical   Date: 09/09/2016  Patient name: Cassie Montgomery Medical record number: [id number] Date of birth: 1971-04-24 Age: 46 y.o. Gender: female PCP: Dorena Dew, FNP  Attending physician: Tresa Garter, MD  Chief Complaint: Generalized pain  History of Present Illness: [name], a 46 year old female with a history of sickle cell anemia, HbSC presents complaining of generalized pain. She says that pain is primarily to right leg and abdomen, which is consistent with sickle cell anemia. She says that pain intensity is 7/10, constant and aching. She says that she has been feeling pain over the past several days. She has been taking MS IR 15 mg every 4 hours as needed with minimal relief. She says that she also been hydrating and resting over the weekend and pain has continued to increase. She denies headache, chest pain, shortness of breath, dysuria, nausea, vomiting, or diarrhea.  Meds: Prescriptions Prior to Admission  Medication Sig Dispense Refill Last Dose  . acetaminophen (TYLENOL) 500 MG tablet Take 1,000 mg by mouth every 6 (six) hours as needed for mild pain, moderate pain, fever or headache.    Taking  . albuterol (PROVENTIL HFA;VENTOLIN HFA) 108 (90 Base) MCG/ACT inhaler Inhale 1-2 puffs into the lungs every 6 (six) hours as needed for wheezing or shortness of breath (and/or cough).   Taking  . diphenhydrAMINE (BENADRYL) 25 MG tablet Take 1-2 tablets (25-50 mg total) by mouth at bedtime as needed for sleep. 60 tablet 2 Taking  . escitalopram (LEXAPRO) 20 MG tablet Take 1 tablet (20 mg total) by mouth daily. 30 tablet 3 Taking  . folic acid (FOLVITE) 1 MG tablet Take 1 mg by mouth daily.   Taking  . morphine (MSIR) 15 MG tablet Take 1 tablet (15 mg total) by mouth every 4 (four) hours as needed for severe pain. 90 tablet 0 Taking  . omeprazole (PRILOSEC) 20 MG capsule Take 1 capsule (20 mg total) by mouth 2 (two) times daily  before a meal. 60 capsule 11 Taking  . oxyCODONE (OXYCONTIN) 10 mg 12 hr tablet Take 1 tablet (10 mg total) by mouth every 12 (twelve) hours. 60 tablet 0   . promethazine (PHENERGAN) 25 MG tablet Take 1 tablet (25 mg total) by mouth every 8 (eight) hours as needed for nausea or vomiting. 60 tablet 2 Taking    Allergies: Hydrea [hydroxyurea]; Latex; Sulfa antibiotics; and Adhesive [tape] Past Medical History:  Diagnosis Date  . Anxiety   . Asthma in adult    probable  . Chronic pain syndrome   . Depression   . Ganglion cyst   . GERD (gastroesophageal reflux disease)   . Left arm pain    chronic  . Migraine with aura 07/15/2011  . PMDD (premenstrual dysphoric disorder)   . Sickle cell anemia (HCC)   . Sickle cell disease without crisis ([place]) 07/15/2011   Past Surgical History:  Procedure Laterality Date  . CESAREAN SECTION     2   Family History  Problem Relation Age of Onset  . Hypertension Mother   . Asthma Son   . Asthma Son   . Asthma Daughter    Social History   Social History  . Marital status: Single    Spouse name: N/A  . Number of children: 3  . Years of education: N/A   Occupational History  . Not on file.   Social History Main Topics  . Smoking status: Never Smoker  .  Smokeless tobacco: Never Used  . Alcohol use No  . Drug use: No  . Sexual activity: Yes    Birth control/ protection: None   Other Topics Concern  . Not on file   Social History Narrative   Uses a cane occasionally.  Does not work.      Review of Systems: Constitutional: positive for fatigue, negative for fevers and night sweats Eyes: negative for icterus Ears, nose, mouth, throat, and face: negative for nasal congestion and sore throat Respiratory: negative for cough and dyspnea on exertion Cardiovascular: negative for chest pain and palpitations Gastrointestinal: negative for constipation, diarrhea and nausea Genitourinary:negative for dysuria Integument/breast:  negative Musculoskeletal:positive for arthralgias and myalgias Neurological: negative for dizziness, gait problems and paresthesia Behavioral/Psych: negative Endocrine: negative  Physical Exam: There were no vitals taken for this visit. BP (!) 150/93   Pulse 62   Temp 98.9 F (37.2 C) (Oral)   Resp 13   Ht 5\' 6"  (1.676 m)   Wt 196 lb (88.9 kg)   LMP 07/11/2016   SpO2 100%   BMI 31.64 kg/m   General Appearance:    Alert, cooperative, mild distress, appears stated age  Head:    Normocephalic, without obvious abnormality, atraumatic  Eyes:    PERRL, conjunctiva/corneas clear, EOM's intact, fundi    benign, both eyes  Ears:    Normal TM's and external ear canals, both ears  Neck:   Supple, symmetrical, trachea midline, no adenopathy;    thyroid:  no enlargement/tenderness/nodules; no carotid   bruit or JVD  Back:     Symmetric, no curvature, ROM normal, no CVA tenderness  Lungs:     Clear to auscultation bilaterally, respirations unlabored  Chest Wall:    No tenderness or deformity   Heart:    Regular rate and rhythm, S1 and S2 normal, no murmur, rub   or gallop  Abdomen:     Soft, tender, bowel sounds active all four quadrants,    no masses, no organomegaly  Extremities:   Extremities normal, atraumatic, no cyanosis or edema  Pulses:   2+ and symmetric all extremities  Skin:   Skin color, texture, turgor normal, no rashes or lesions  Lymph nodes:   Cervical, supraclavicular, and axillary nodes normal  Neurologic:   CNII-XII intact, normal strength, sensation and reflexes    throughout    Lab results: No results found for this or any previous visit (from the past 24 hour(s)).  Imaging results:  No results found.   Assessment & Plan:  Patient will be admitted to the day infusion center for extended observation  Start IV D5.45 for cellular rehydration at 125/hr  Start Toradol 15 mg IV times 1  Start Dilaudid PCA High Concentration per weight based protocol.    Patient will be re-evaluated for pain intensity in the context of function and relationship to baseline as care progresses.  If no significant pain relief, will transfer patient to inpatient services for a higher level of care.   Will check CMP,  Reticulocytes, and CBC w/differential   Anamae Rochelle M 09/09/2016, 10:03 AM

## 2016-09-09 NOTE — Progress Notes (Signed)
Pt received to the [facility] from the office side for treatment. Pt states her pain was 7/10 on admission and was having generalized pain but mostly in her legs. She was treated with rest, IV fluids, Dilaudid PCA and Toradol.  She was also treat with IV fluids with potassium because of a decrease in her serum potassium. Pt's pain was down to 5/10 at discharge. Pt was alert, oriented and ambulatory at discharge. She received discharge instructions with verbal understanding.

## 2016-09-09 NOTE — Patient Instructions (Addendum)
Sickle cell anemia:   Pain medication regimen:  Oxycontin 10 mg every 12 hours for pain MS IR 15 mg every 4 hours as needed for moderate to severe pain.      Sickle Cell Anemia, Adult Sickle cell anemia is a condition where your red blood cells are shaped like sickles. Red blood cells carry oxygen through the body. Sickle-shaped red blood cells do not live as long as normal red blood cells. They also clump together and block blood from flowing through the blood vessels. These things prevent the body from getting enough oxygen. Sickle cell anemia causes organ damage and pain. It also increases the risk of infection. Follow these instructions at home:  Drink enough fluid to keep your pee (urine) clear or pale yellow. Drink more in hot weather and during exercise.  Do not smoke. Smoking lowers oxygen levels in the blood.  Only take over-the-counter or prescription medicines as told by your doctor.  Take antibiotic medicines as told by your doctor. Make sure you finish them even if you start to feel better.  Take supplements as told by your doctor.  Consider wearing a medical alert bracelet. This tells anyone caring for you in an emergency of your condition.  When traveling, keep your medical information, doctors' names, and the medicines you take with you at all times.  If you have a fever, do not take fever medicines right away. This could cover up a problem. Tell your doctor.  Keep all follow-up visits with your doctor. Sickle cell anemia requires regular medical care. Contact a doctor if: You have a fever. Get help right away if:  You feel dizzy or faint.  You have new belly (abdominal) pain, especially on the left side near the stomach area.  You have a lasting, often uncomfortable and painful erection of the penis (priapism). If it is not treated right away, you will become unable to have sex (impotence).  You have numbness in your arms or legs or you have a hard time moving  them.  You have a hard time talking.  You have a fever or lasting symptoms for more than 2-3 days.  You have a fever and your symptoms suddenly get worse.  You have signs or symptoms of infection. These include:  Chills.  Being more tired than normal (lethargy).  Irritability.  Poor eating.  Throwing up (vomiting).  You have pain that is not helped with medicine.  You have shortness of breath.  You have pain in your chest.  You are coughing up pus-like or bloody mucus.  You have a stiff neck.  Your feet or hands swell or have pain.  Your belly looks bloated.  Your joints hurt. This information is not intended to replace advice given to you by your health care provider. Make sure you discuss any questions you have with your health care provider. Document Released: 02/17/2013 Document Revised: 10/05/2015 Document Reviewed: 12/09/2012 Elsevier Interactive Patient Education  2017 [employer].

## 2016-09-09 NOTE — Discharge Summary (Signed)
[facility] Discharge Summary   Patient ID: Cassie Montgomery MRN: [id number] DOB/AGE: 10/07/1970 46 y.o.  Admit date: 09/09/2016 Discharge date: 09/09/2016  Primary Care Physician:  Dorena Dew, FNP  Admission Diagnoses:  Active Problems:   Sickle cell anemia with pain [facility])  Discharge Medications:  Allergies as of 09/09/2016      Reactions   Hydrea [hydroxyurea] Nausea Only   Latex Swelling, Other (See Comments)   Reaction:  Localized swelling    Sulfa Antibiotics Other (See Comments)   Reaction:  Increased bilirubin    Adhesive [tape] Rash      Medication List    TAKE these medications   acetaminophen 500 MG tablet Commonly known as:  TYLENOL Take 1,000 mg by mouth every 6 (six) hours as needed for mild pain, moderate pain, fever or headache.   albuterol 108 (90 Base) MCG/ACT inhaler Commonly known as:  PROVENTIL HFA;VENTOLIN HFA Inhale 1-2 puffs into the lungs every 6 (six) hours as needed for wheezing or shortness of breath (and/or cough).   diphenhydrAMINE 25 MG tablet Commonly known as:  BENADRYL Take 1-2 tablets (25-50 mg total) by mouth at bedtime as needed for sleep.   escitalopram 20 MG tablet Commonly known as:  LEXAPRO Take 1 tablet (20 mg total) by mouth daily.   folic acid 1 MG tablet Commonly known as:  FOLVITE Take 1 mg by mouth daily.   morphine 15 MG tablet Commonly known as:  MSIR Take 1 tablet (15 mg total) by mouth every 4 (four) hours as needed for severe pain.   omeprazole 20 MG capsule Commonly known as:  PRILOSEC Take 1 capsule (20 mg total) by mouth 2 (two) times daily before a meal.   oxyCODONE 10 mg 12 hr tablet Commonly known as:  OXYCONTIN Take 1 tablet (10 mg total) by mouth every 12 (twelve) hours.   promethazine 25 MG tablet Commonly known as:  PHENERGAN Take 1 tablet (25 mg total) by mouth every 8 (eight) hours as needed for nausea or vomiting.        Consults:  None  Significant Diagnostic  Studies:  No results found.   [facility] Course: [name], a 46 year old female with a history of sickle cell anemia, HbSC presents complaining of generalized pain. She was transitioned to the day infusion center for extended observation.   She was given D5.45 for cellular rehydration. She was also given Toradol 15 mg times 1 for inflammation.  Reviewed labs: Potassium was 2.5. Patient asymptomatic. Given K-Dur 20 mEQ and KCL was added to IV fluids. Patient to return to clinic on 09/11/2016 for labs.   Pain management:  Ms. Wish was started on high concentration PCA dilaudid per weight based protocol. She used a total of 10.5 mg with 32 demands and 19 deliveries. She says that pain intensity has decreased to 6/10 and she can manage at home on current medication regimen.   Discharge instructions:  Continue to hydrate with 64 ounces of water Take pain medications as previously prescribed.  Patient was given instructions to return to clinic if pain persists on tomorrow, she expressed understanding.    The patient was given clear instructions to go to ER or return to medical center if symptoms do not improve, worsen or new problems develop. The patient verbalized understanding.      Physical Exam at Discharge:  BP 126/66   Pulse (!) 59   Temp 98.9 F (37.2 C) (Oral)  Resp 16   Ht 5\' 6"  (1.676 m)   Wt 196 lb (88.9 kg)   LMP 07/11/2016   SpO2 97%   BMI 31.64 kg/m   BP 126/66   Pulse (!) 59   Temp 98.9 F (37.2 C) (Oral)   Resp 16   Ht 5\' 6"  (1.676 m)   Wt 196 lb (88.9 kg)   LMP 07/11/2016   SpO2 97%   BMI 31.64 kg/m   General Appearance:    Alert, cooperative, no distress, appears stated age  Head:    Normocephalic, without obvious abnormality, atraumatic  Lungs:     Clear to auscultation bilaterally, respirations unlabored  Chest Wall:    No tenderness or deformity   Heart:    Regular rate and rhythm, S1 and S2 normal, no murmur, rub   or  gallop  Abdomen:     Soft, tender, bowel sounds active all four quadrants,    no masses, no organomegaly      Disposition at Discharge: 01-Home or Self Care  Discharge Orders: Discharge Instructions    Discharge patient    Complete by:  As directed    Discharge disposition:  01-Home or Self Care   Discharge patient date:  09/09/2016      Condition at Discharge:   Stable  Time spent on Discharge:  Greater than 30 minutes.  Signed: Harlei Lehrmann M 09/09/2016, 3:35 PM

## 2016-09-09 NOTE — Discharge Instructions (Signed)
Follow up on 09/11/2016 for labs.  Continue to hydrate with 64 ounces of water  Take pain medications as previously prescribed.   Sickle Cell Anemia, Adult Sickle cell anemia is a condition where your red blood cells are shaped like sickles. Red blood cells carry oxygen through the body. Sickle-shaped red blood cells do not live as long as normal red blood cells. They also clump together and block blood from flowing through the blood vessels. These things prevent the body from getting enough oxygen. Sickle cell anemia causes organ damage and pain. It also increases the risk of infection. Follow these instructions at home:  Drink enough fluid to keep your pee (urine) clear or pale yellow. Drink more in hot weather and during exercise.  Do not smoke. Smoking lowers oxygen levels in the blood.  Only take over-the-counter or prescription medicines as told by your doctor.  Take antibiotic medicines as told by your doctor. Make sure you finish them even if you start to feel better.  Take supplements as told by your doctor.  Consider wearing a medical alert bracelet. This tells anyone caring for you in an emergency of your condition.  When traveling, keep your medical information, doctors' names, and the medicines you take with you at all times.  If you have a fever, do not take fever medicines right away. This could cover up a problem. Tell your doctor.  Keep all follow-up visits with your doctor. Sickle cell anemia requires regular medical care. Contact a doctor if: You have a fever. Get help right away if:  You feel dizzy or faint.  You have new belly (abdominal) pain, especially on the left side near the stomach area.  You have a lasting, often uncomfortable and painful erection of the penis (priapism). If it is not treated right away, you will become unable to have sex (impotence).  You have numbness in your arms or legs or you have a hard time moving them.  You have a hard time  talking.  You have a fever or lasting symptoms for more than 2-3 days.  You have a fever and your symptoms suddenly get worse.  You have signs or symptoms of infection. These include:  Chills.  Being more tired than normal (lethargy).  Irritability.  Poor eating.  Throwing up (vomiting).  You have pain that is not helped with medicine.  You have shortness of breath.  You have pain in your chest.  You are coughing up pus-like or bloody mucus.  You have a stiff neck.  Your feet or hands swell or have pain.  Your belly looks bloated.  Your joints hurt. This information is not intended to replace advice given to you by your health care provider. Make sure you discuss any questions you have with your health care provider. Document Released: 02/17/2013 Document Revised: 10/05/2015 Document Reviewed: 12/09/2012 Elsevier Interactive Patient Education  2017 [employer].

## 2016-09-09 NOTE — Progress Notes (Signed)
CRITICAL VALUE ALERT  Critical value received:  K+   2.5  Date of notification:  09/09/16  Time of notification:  12:12  Critical value read back: yes  Nurse who received alert:  I.Kayleeann Huxford, RN  MD notified (1st page): [place] Hollis, NP  Time of first page: in person  MD notified (2nd page):  Time of second page:  Responding MD:  [place] Hollis, NP  Time MD responded:  12:15

## 2016-09-10 ENCOUNTER — Telehealth (HOSPITAL_COMMUNITY): Payer: Self-pay | Admitting: *Deleted

## 2016-09-10 ENCOUNTER — Encounter (HOSPITAL_COMMUNITY): Payer: Self-pay | Admitting: *Deleted

## 2016-09-10 ENCOUNTER — Other Ambulatory Visit: Payer: Self-pay | Admitting: Family Medicine

## 2016-09-10 ENCOUNTER — Inpatient Hospital Stay (HOSPITAL_COMMUNITY)
Admission: EM | Admit: 2016-09-10 | Discharge: 2016-09-12 | DRG: 640 | Disposition: A | Discharge status: Home / Self Care | Payer: Medicare Other | Attending: Internal Medicine | Admitting: Internal Medicine

## 2016-09-10 DIAGNOSIS — R1110 Vomiting, unspecified: Secondary | ICD-10-CM

## 2016-09-10 DIAGNOSIS — Z882 Allergy status to sulfonamides status: Secondary | ICD-10-CM

## 2016-09-10 DIAGNOSIS — R809 Proteinuria, unspecified: Secondary | ICD-10-CM | POA: Diagnosis present

## 2016-09-10 DIAGNOSIS — K219 Gastro-esophageal reflux disease without esophagitis: Secondary | ICD-10-CM | POA: Diagnosis not present

## 2016-09-10 DIAGNOSIS — K5900 Constipation, unspecified: Secondary | ICD-10-CM | POA: Diagnosis not present

## 2016-09-10 DIAGNOSIS — G894 Chronic pain syndrome: Secondary | ICD-10-CM | POA: Diagnosis present

## 2016-09-10 DIAGNOSIS — Z91040 Latex allergy status: Secondary | ICD-10-CM | POA: Diagnosis not present

## 2016-09-10 DIAGNOSIS — Z91048 Other nonmedicinal substance allergy status: Secondary | ICD-10-CM

## 2016-09-10 DIAGNOSIS — D5700 Hb-SS disease with crisis, unspecified: Secondary | ICD-10-CM | POA: Diagnosis not present

## 2016-09-10 DIAGNOSIS — E860 Dehydration: Secondary | ICD-10-CM | POA: Diagnosis present

## 2016-09-10 DIAGNOSIS — E876 Hypokalemia: Secondary | ICD-10-CM | POA: Diagnosis not present

## 2016-09-10 DIAGNOSIS — R112 Nausea with vomiting, unspecified: Secondary | ICD-10-CM | POA: Diagnosis present

## 2016-09-10 DIAGNOSIS — D57219 Sickle-cell/Hb-C disease with crisis, unspecified: Secondary | ICD-10-CM | POA: Diagnosis present

## 2016-09-10 DIAGNOSIS — J45909 Unspecified asthma, uncomplicated: Secondary | ICD-10-CM | POA: Diagnosis not present

## 2016-09-10 DIAGNOSIS — R1011 Right upper quadrant pain: Secondary | ICD-10-CM

## 2016-09-10 DIAGNOSIS — R1013 Epigastric pain: Secondary | ICD-10-CM | POA: Diagnosis not present

## 2016-09-10 DIAGNOSIS — F329 Major depressive disorder, single episode, unspecified: Secondary | ICD-10-CM | POA: Diagnosis present

## 2016-09-10 DIAGNOSIS — Z888 Allergy status to other drugs, medicaments and biological substances status: Secondary | ICD-10-CM

## 2016-09-10 LAB — CBC WITH DIFFERENTIAL/PLATELET
Basophils Absolute: 0 10*3/uL (ref 0.0–0.1)
Basophils Relative: 0 %
EOS ABS: 0 10*3/uL (ref 0.0–0.7)
EOS PCT: 0 %
HCT: 34.8 % — ABNORMAL LOW (ref 36.0–46.0)
Hemoglobin: 12 g/dL (ref 12.0–15.0)
LYMPHS ABS: 1.2 10*3/uL (ref 0.7–4.0)
LYMPHS PCT: 12 %
MCH: 25.6 pg — AB (ref 26.0–34.0)
MCHC: 34.5 g/dL (ref 30.0–36.0)
MCV: 74.2 fL — AB (ref 78.0–100.0)
MONO ABS: 0.3 10*3/uL (ref 0.1–1.0)
Monocytes Relative: 2 %
Neutro Abs: 8.8 10*3/uL — ABNORMAL HIGH (ref 1.7–7.7)
Neutrophils Relative %: 85 %
PLATELETS: 155 10*3/uL (ref 150–400)
RBC: 4.69 MIL/uL (ref 3.87–5.11)
RDW: 18 % — AB (ref 11.5–15.5)
WBC: 10.3 10*3/uL (ref 4.0–10.5)

## 2016-09-10 LAB — URINALYSIS, ROUTINE W REFLEX MICROSCOPIC
Glucose, UA: NEGATIVE mg/dL
Ketones, ur: NEGATIVE mg/dL
NITRITE: NEGATIVE
PH: 7 (ref 5.0–8.0)
Protein, ur: >=300 mg/dL — AB
SPECIFIC GRAVITY, URINE: 1.01 (ref 1.005–1.030)

## 2016-09-10 LAB — COMPREHENSIVE METABOLIC PANEL
ALBUMIN: 4.7 g/dL (ref 3.5–5.0)
ALT: 25 U/L (ref 14–54)
AST: 28 U/L (ref 15–41)
Alkaline Phosphatase: 74 U/L (ref 38–126)
Anion gap: 11 (ref 5–15)
BILIRUBIN TOTAL: 2.8 mg/dL — AB (ref 0.3–1.2)
BUN: 10 mg/dL (ref 6–20)
CHLORIDE: 102 mmol/L (ref 101–111)
CO2: 26 mmol/L (ref 22–32)
CREATININE: 0.66 mg/dL (ref 0.44–1.00)
Calcium: 9.4 mg/dL (ref 8.9–10.3)
GFR calc Af Amer: >60 mL/min (ref >60)
GFR calc non Af Amer: >60 mL/min (ref >60)
GLUCOSE: 179 mg/dL — AB (ref 65–99)
POTASSIUM: 2.5 mmol/L — AB (ref 3.5–5.1)
Sodium: 139 mmol/L (ref 135–145)
Total Protein: 8.4 g/dL — ABNORMAL HIGH (ref 6.5–8.1)

## 2016-09-10 LAB — RETICULOCYTES
RBC.: 4.69 MIL/uL (ref 3.87–5.11)
RETIC CT PCT: 4.5 % — AB (ref 0.4–3.1)
Retic Count, Absolute: 211.1 10*3/uL — ABNORMAL HIGH (ref 19.0–186.0)

## 2016-09-10 LAB — PREGNANCY, URINE: PREG TEST UR: NEGATIVE

## 2016-09-10 LAB — MAGNESIUM: MAGNESIUM: 1.6 mg/dL — AB (ref 1.7–2.4)

## 2016-09-10 MED ORDER — MORPHINE SULFATE ER 15 MG PO TBCR: 15.0000 mg | EXTENDED_RELEASE_TABLET | Freq: Two times a day (BID) | ORAL | 0 refills | Status: DC

## 2016-09-10 MED ORDER — SODIUM CHLORIDE 0.9% FLUSH: 9.0000 mL | INTRAVENOUS | Status: DC | PRN

## 2016-09-10 MED ORDER — PROMETHAZINE HCL 25 MG RE SUPP: 25.0000 mg | Freq: Four times a day (QID) | RECTAL | Status: DC | PRN

## 2016-09-10 MED ORDER — POTASSIUM CHLORIDE CRYS ER 20 MEQ PO TBCR
60.0000 meq | EXTENDED_RELEASE_TABLET | Freq: Once | ORAL | Status: AC
  Administered 2016-09-10: 60 meq via ORAL
  Filled 2016-09-10: qty 3

## 2016-09-10 MED ORDER — PROMETHAZINE HCL 25 MG/ML IJ SOLN
12.5000 mg | Freq: Four times a day (QID) | INTRAMUSCULAR | Status: DC | PRN
  Administered 2016-09-11: 12.5 mg via INTRAVENOUS
  Filled 2016-09-10: qty 1

## 2016-09-10 MED ORDER — METOCLOPRAMIDE HCL 5 MG/ML IJ SOLN
10.0000 mg | Freq: Once | INTRAMUSCULAR | Status: AC
  Administered 2016-09-10: 10 mg via INTRAVENOUS
  Filled 2016-09-10: qty 2

## 2016-09-10 MED ORDER — SODIUM CHLORIDE 0.9 % IV BOLUS (SEPSIS)
1000.0000 mL | Freq: Once | INTRAVENOUS | Status: AC
  Administered 2016-09-10: 1000 mL via INTRAVENOUS

## 2016-09-10 MED ORDER — SODIUM CHLORIDE 0.9 % IV SOLN: 30.0000 meq | Freq: Once | INTRAVENOUS | Status: DC

## 2016-09-10 MED ORDER — ONDANSETRON HCL 4 MG/2ML IJ SOLN
4.0000 mg | Freq: Four times a day (QID) | INTRAMUSCULAR | Status: DC | PRN
  Administered 2016-09-10 – 2016-09-11 (×2): 4 mg via INTRAVENOUS
  Filled 2016-09-10 (×2): qty 2

## 2016-09-10 MED ORDER — POTASSIUM CHLORIDE 10 MEQ/100ML IV SOLN
10.0000 meq | INTRAVENOUS | Status: AC
  Administered 2016-09-10 (×2): 10 meq via INTRAVENOUS
  Filled 2016-09-10 (×2): qty 100

## 2016-09-10 MED ORDER — ONDANSETRON 8 MG PO TBDP: 8.0000 mg | ORAL_TABLET | Freq: Three times a day (TID) | ORAL | 0 refills | Status: DC | PRN

## 2016-09-10 MED ORDER — SODIUM CHLORIDE 0.9 % IV SOLN
25.0000 mg | Freq: Four times a day (QID) | INTRAVENOUS | Status: DC | PRN
  Filled 2016-09-10: qty 0.5

## 2016-09-10 MED ORDER — ENOXAPARIN SODIUM 40 MG/0.4ML ~~LOC~~ SOLN
40.0000 mg | SUBCUTANEOUS | Status: DC
  Filled 2016-09-10: qty 0.4

## 2016-09-10 MED ORDER — ONDANSETRON HCL 4 MG/2ML IJ SOLN
4.0000 mg | Freq: Once | INTRAMUSCULAR | Status: AC
  Administered 2016-09-10: 4 mg via INTRAVENOUS
  Filled 2016-09-10: qty 2

## 2016-09-10 MED ORDER — HYDROMORPHONE 1 MG/ML IV SOLN
INTRAVENOUS | Status: DC
  Administered 2016-09-10: 21:00:00 via INTRAVENOUS
  Administered 2016-09-11: 11.93 mg via INTRAVENOUS
  Administered 2016-09-11: 6.5 mg via INTRAVENOUS
  Administered 2016-09-11: 3 mg via INTRAVENOUS
  Administered 2016-09-11: 17:00:00 via INTRAVENOUS
  Administered 2016-09-11: 4.5 mg via INTRAVENOUS
  Administered 2016-09-11: 2.5 mg via INTRAVENOUS
  Administered 2016-09-11: 5 mg via INTRAVENOUS
  Administered 2016-09-12: 06:00:00 via INTRAVENOUS
  Administered 2016-09-12: 7 mg via INTRAVENOUS
  Administered 2016-09-12: 9.5 mg via INTRAVENOUS
  Administered 2016-09-12: 6 mg via INTRAVENOUS
  Filled 2016-09-10 (×3): qty 25

## 2016-09-10 MED ORDER — POTASSIUM CHLORIDE ER 20 MEQ PO TBCR: 20.0000 meq | EXTENDED_RELEASE_TABLET | Freq: Two times a day (BID) | ORAL | 0 refills | Status: DC

## 2016-09-10 MED ORDER — HYDROMORPHONE HCL 1 MG/ML IJ SOLN
1.0000 mg | Freq: Once | INTRAMUSCULAR | Status: AC
  Administered 2016-09-10: 1 mg via INTRAVENOUS
  Filled 2016-09-10: qty 1

## 2016-09-10 MED ORDER — PROMETHAZINE HCL 25 MG PO TABS
25.0000 mg | ORAL_TABLET | Freq: Four times a day (QID) | ORAL | Status: DC | PRN
Start: 2016-09-10 — End: 2016-09-12

## 2016-09-10 MED ORDER — KCL IN DEXTROSE-NACL 40-5-0.45 MEQ/L-%-% IV SOLN
INTRAVENOUS | Status: DC
  Administered 2016-09-10 – 2016-09-11 (×2): via INTRAVENOUS
  Filled 2016-09-10 (×3): qty 1000

## 2016-09-10 MED ORDER — KETOROLAC TROMETHAMINE 15 MG/ML IJ SOLN
15.0000 mg | Freq: Three times a day (TID) | INTRAMUSCULAR | Status: DC
  Administered 2016-09-10 – 2016-09-12 (×5): 15 mg via INTRAVENOUS
  Filled 2016-09-10 (×5): qty 1

## 2016-09-10 MED ORDER — ESCITALOPRAM OXALATE 20 MG PO TABS
20.0000 mg | ORAL_TABLET | Freq: Every day | ORAL | Status: DC
  Administered 2016-09-10 – 2016-09-12 (×3): 20 mg via ORAL
  Filled 2016-09-10 (×3): qty 1

## 2016-09-10 MED ORDER — SODIUM CHLORIDE 0.9% FLUSH
3.0000 mL | Freq: Two times a day (BID) | INTRAVENOUS | Status: DC
  Administered 2016-09-10 – 2016-09-11 (×2): 3 mL via INTRAVENOUS

## 2016-09-10 MED ORDER — POTASSIUM CHLORIDE CRYS ER 20 MEQ PO TBCR
40.0000 meq | EXTENDED_RELEASE_TABLET | Freq: Once | ORAL | Status: DC
Start: 2016-09-10 — End: 2016-09-10

## 2016-09-10 MED ORDER — POTASSIUM CHLORIDE 10 MEQ/100ML IV SOLN
10.0000 meq | INTRAVENOUS | Status: DC
  Filled 2016-09-10 (×3): qty 100

## 2016-09-10 MED ORDER — PANTOPRAZOLE SODIUM 40 MG IV SOLR
40.0000 mg | Freq: Every day | INTRAVENOUS | Status: DC
  Administered 2016-09-10 – 2016-09-11 (×2): 40 mg via INTRAVENOUS
  Filled 2016-09-10 (×2): qty 40

## 2016-09-10 MED ORDER — POTASSIUM CHLORIDE 10 MEQ/100ML IV SOLN
10.0000 meq | INTRAVENOUS | Status: DC
  Administered 2016-09-10: 10 meq via INTRAVENOUS
  Filled 2016-09-10 (×3): qty 100

## 2016-09-10 MED ORDER — HYDROMORPHONE HCL 1 MG/ML IJ SOLN
2.0000 mg | Freq: Once | INTRAMUSCULAR | Status: AC
  Administered 2016-09-10: 2 mg via INTRAVENOUS
  Filled 2016-09-10: qty 2

## 2016-09-10 MED ORDER — ONDANSETRON 4 MG PO TBDP: 4.0000 mg | ORAL_TABLET | Freq: Once | ORAL | Status: DC

## 2016-09-10 MED ORDER — DIPHENHYDRAMINE HCL 25 MG PO CAPS
25.0000 mg | ORAL_CAPSULE | Freq: Four times a day (QID) | ORAL | Status: DC | PRN
  Administered 2016-09-10 – 2016-09-11 (×3): 50 mg via ORAL
  Administered 2016-09-12: 25 mg via ORAL
  Administered 2016-09-12: 50 mg via ORAL
  Filled 2016-09-10 (×5): qty 2

## 2016-09-10 MED ORDER — NALOXONE HCL 0.4 MG/ML IJ SOLN: 0.4000 mg | INTRAMUSCULAR | Status: DC | PRN

## 2016-09-10 NOTE — Progress Notes (Signed)
Patient was prescribed Oxcontin 10 mg long acting every 12 hours for chronic pain. Medication was not covered by insurance plan. Reviewed [place] Substance Reporting system prior to prescribing opiate medications. No inconsistencies noted.     Meds ordered this encounter  Medications  . morphine (MS CONTIN) 15 MG 12 hr tablet    Sig: Take 1 tablet (15 mg total) by mouth every 12 (twelve) hours.    Dispense:  60 tablet    Refill:  0    Order Specific Question:   Supervising Provider    Answer:   Tresa Garter [[id number]]    Donia Pounds  MSN, FNP-C [facility] [address] [place], [place] [address] [phone]

## 2016-09-10 NOTE — H&P (Addendum)
Hospital Admission Note Date: 09/10/2016  Patient name: Cassie Montgomery Medical record number: [id number] Date of birth: [birth year] Age: 46 y.o. Gender: female PCP: Cassie Dew, FNP  Attending physician: Jola Schmidt, MD  Chief Complaint: Vomiting x 4 days  History of Present Illness: This is a patient with Hb Cassie Montgomery who has almost daily emesis which sometimes abates and sometimes continues for several days.  Pt had an episode which started 4 days ago which become progressively worse. She has had more than 20 episodes of bilious emesis but no blood. Pt states that the emesis is usually associated with a crisis and that it is preceded by hot flashes and sweats and is sometimes associated with diarrhea.  Pt has had 6  diarrheal stools which started today. There is no associated fevers, cough, SOB, HA or focal weakness.  This often triggers a sickle cell crisis and what has compounded the situation is that she was unable to obtain her pain medications due to issues with her insurance.  Currently she has pain localized to the epigastric region, back and legs. She rates pain at 7/10 and describes pain as if he stomach is bing squeezed and twisted. Pain in back and legs are throbbing in nature, non-radiating and associated only with the emesis.   She was seen in the ED with pain initially at 10/10.    Scheduled Meds: Continuous Infusions: . potassium chloride     PRN Meds:. Allergies: Hydrea [hydroxyurea]; Latex; Sulfa antibiotics; and Adhesive [tape] Past Medical History:  Diagnosis Date  . Anxiety   . Asthma in adult    probable  . Chronic pain syndrome   . Depression   . Ganglion cyst   . GERD (gastroesophageal reflux disease)   . Left arm pain    chronic  . Migraine with aura 07/15/2011  . PMDD (premenstrual dysphoric disorder)   . Sickle cell anemia (HCC)   . Sickle cell disease without crisis (Cassie Montgomery) 07/15/2011   Past Surgical History:  Procedure Laterality Date  . CESAREAN SECTION      2   Family History  Problem Relation Age of Onset  . Hypertension Mother   . Asthma Son   . Asthma Son   . Asthma Daughter    Social History   Social History  . Marital status: Single    Spouse name: N/A  . Number of children: 3  . Years of education: N/A   Occupational History  . Not on file.   Social History Main Topics  . Smoking status: Never Smoker  . Smokeless tobacco: Never Used  . Alcohol use No  . Drug use: No  . Sexual activity: Yes    Birth control/ protection: None   Other Topics Concern  . Not on file   Social History Narrative   Uses a cane occasionally.  Does not work.     Review of Systems: Pertinent items noted in HPI and remainder of comprehensive ROS otherwise negative. Physical Exam:  Intake/Output Summary (Last 24 hours) at 09/10/16 1648 Last data filed at 09/10/16 1312  Gross per 24 hour  Intake             1000 ml  Output                0 ml  Net             1000 ml   General: Alert, awake, oriented x3, in no acute distress.  HEENT: [place]/AT PEERL, EOMI Neck: Trachea midline,  no masses, no thyromegal,y no JVD, no carotid bruit OROPHARYNX:  Moist, No exudate/ erythema/lesions.  Heart: Regular rate and rhythm, without murmurs, rubs, gallops, PMI non-displaced, no heaves or thrills on palpation.  Lungs: Clear to auscultation, no wheezing or rhonchi noted. No increased vocal fremitus resonant to percussion  Abdomen: Soft, nontender, nondistended, positive bowel sounds, no masses no hepatosplenomegaly noted..  Neuro: No focal neurological deficits noted cranial nerves II through XII grossly intact. DTRs 2+ bilaterally upper and lower extremities. Strength 5 out of 5 in bilateral upper and lower extremities. Musculoskeletal: No warm swelling or erythema around joints, no spinal tenderness noted. Psychiatric: Patient alert and oriented x3, good insight and cognition, good recent to remote recall. Lymph node survey: No cervical axillary or  inguinal lymphadenopathy noted.  Lab results:  Recent Labs  09/09/16 1105 09/10/16 1057  NA 139 139  K 2.5* 2.5*  CL 101 102  CO2 30 26  GLUCOSE 103* 179*  BUN 8 10  CREATININE 0.57 0.66  CALCIUM 8.8* 9.4    Recent Labs  09/09/16 1105 09/10/16 1057  AST 26 28  ALT 23 25  ALKPHOS 65 74  BILITOT 1.9* 2.8*  PROT 7.3 8.4*  ALBUMIN 4.1 4.7   No results for input(s): LIPASE, AMYLASE in the last 72 hours.  Recent Labs  09/09/16 1105 09/10/16 1057  WBC 7.3 10.3  NEUTROABS 4.8 8.8*  HGB 10.5* 12.0  HCT 31.2* 34.8*  MCV 74.8* 74.2*  PLT 129* 155   No results for input(s): CKTOTAL, CKMB, CKMBINDEX, TROPONINI in the last 72 hours. Invalid input(s): POCBNP No results for input(s): DDIMER in the last 72 hours. No results for input(s): HGBA1C in the last 72 hours. No results for input(s): CHOL, HDL, LDLCALC, TRIG, CHOLHDL, LDLDIRECT in the last 72 hours. No results for input(s): TSH, T4TOTAL, T3FREE, THYROIDAB in the last 72 hours.  Invalid input(s): [place]  09/09/16 1105 09/10/16 1057  RETICCTPCT 4.5* 4.5*   Imaging results:  No results found.    Assessment and Plan: 1. Intractable emesis: Clear liquid diet, IVF, Protonix, Phenergan and Zofran. Will review chart extensively and make an assessment of further needs. ?? Abdominal/GI crisis??? 2. Hypokalemia: Replace by IV, obtain 12 lead EKG. 3. Proteinuria: Will obtain 24 hours urine after hydration. 4. Hb [place] with Crisis: Dilaudid PCA, Toradol 5. Chronic Pain Syndrome: Hold on any oral analgesics at this time. Consult CM to assist with insurance issues involved with obtaining medications.    MATTHEWS,MICHELLE A. 09/10/2016, 4:48 PM  Time spent during this visit 70 minutes. Greater than 505 spent in face to face contact, counseling and coordination of care.   MATTHEWS,MICHELLE A.  Pager [phone]. If 7PM-7AM, please contact night-coverage.

## 2016-09-10 NOTE — ED Provider Notes (Signed)
[place] DEPT Provider Note   CSN: [id number] Arrival date & time: 09/10/16  1024     History   Chief Complaint Chief Complaint  Patient presents with  . Sickle Cell Pain Crisis    HPI Kevina Balthazar is a 46 y.o. female.  HPI Patient reports ongoing sickle cell crisis related pain over the past 3 days.  She was seen in sickle cell clinic yesterday and treated and found to be hypokalemic.  She was given oral potassium and she states she is feeling somewhat better at time of discharge.  She states she's had ongoing vomiting and nausea and is having some generalized abdominal discomfort.  She states she's been unable to keep her oral morphine down and is now out of her morphine.  She states that she was also recently switched to [place] but her insurance will not cover this.  She denies fevers and chills.  No dysuria or urinary frequency.  Pain is severe in severity.   Past Medical History:  Diagnosis Date  . Anxiety   . Asthma in adult    probable  . Chronic pain syndrome   . Depression   . Ganglion cyst   . GERD (gastroesophageal reflux disease)   . Left arm pain    chronic  . Migraine with aura 07/15/2011  . PMDD (premenstrual dysphoric disorder)   . Sickle cell anemia (HCC)   . Sickle cell disease without crisis ([place]) 07/15/2011    Patient Active Problem List   Diagnosis Date Noted  . Hypokalemia   . Menopausal syndrome 04/09/2016  . Perimenopausal symptoms 04/09/2016  . Periorbital edema 10/10/2015  . Dyslipidemia 10/10/2015  . Chronic left shoulder pain 09/06/2015  . Sickle cell anemia with pain ([place]) 02/02/2015  . Cephalalgia 10/26/2014  . Medicare annual wellness visit, initial 07/27/2014  . Vitamin D deficiency 06/28/2014  . GERD (gastroesophageal reflux disease) 06/12/2014  . Asthma in adult   . Cholecystitis, acute 06/10/2014  . Chronic prescription opiate use 01/12/2014  . Depression, major 10/07/2013  . PTSD (post-traumatic stress disorder)  10/07/2013  . Weight gain 09/27/2013  . Irregular menstrual cycle 05/12/2013  . Panic anxiety syndrome 05/11/2013  . Insomnia 05/11/2013  . Pap smear for cervical cancer screening 05/11/2013  . Nausea and vomiting in adult 01/06/2013  . Chronic pain syndrome 01/06/2013  . Depressive disorder, not elsewhere classified 10/20/2012  . Generalized anxiety disorder 10/20/2012  . Opiate analgesic contract exists 09/11/2012  . Anemia 12/07/2011    Past Surgical History:  Procedure Laterality Date  . CESAREAN SECTION     2    OB History    Gravida Para Term Preterm AB Living   3         3   SAB TAB Ectopic Multiple Live Births           3       Home Medications    Prior to Admission medications   Medication Sig Start Date End Date Taking? Authorizing Provider  acetaminophen (TYLENOL) 500 MG tablet Take 1,000 mg by mouth every 6 (six) hours as needed for mild pain, moderate pain, fever or headache.    Yes Historical Provider, MD  diphenhydrAMINE (BENADRYL) 25 MG tablet Take 1-2 tablets (25-50 mg total) by mouth at bedtime as needed for sleep. 10/10/15  Yes Tresa Garter, MD  escitalopram (LEXAPRO) 20 MG tablet Take 1 tablet (20 mg total) by mouth daily. 07/18/16  Yes Tresa Garter, MD  folic acid (FOLVITE)  1 MG tablet Take 1 mg by mouth daily.   Yes Historical Provider, MD  morphine (MSIR) 15 MG tablet Take 1 tablet (15 mg total) by mouth every 4 (four) hours as needed for severe pain. 08/28/16  Yes Tresa Garter, MD  omeprazole (PRILOSEC) 20 MG capsule Take 1 capsule (20 mg total) by mouth 2 (two) times daily before a meal. 12/12/15  Yes Ladene Artist, MD  promethazine (PHENERGAN) 25 MG tablet Take 1 tablet (25 mg total) by mouth every 8 (eight) hours as needed for nausea or vomiting. 06/25/16  Yes Tresa Garter, MD  albuterol (PROVENTIL HFA;VENTOLIN HFA) 108 (90 Base) MCG/ACT inhaler Inhale 1-2 puffs into the lungs every 6 (six) hours as needed for wheezing or  shortness of breath (and/or cough).    Historical Provider, MD  ondansetron (ZOFRAN ODT) 8 MG disintegrating tablet Take 1 tablet (8 mg total) by mouth every 8 (eight) hours as needed for nausea or vomiting. 09/10/16   Jola Schmidt, MD  oxyCODONE (OXYCONTIN) 10 mg 12 hr tablet Take 1 tablet (10 mg total) by mouth every 12 (twelve) hours. Patient not taking: Reported on 09/10/2016 09/09/16   Dorena Dew, FNP  potassium chloride 20 MEQ TBCR Take 20 mEq by mouth 2 (two) times daily. 09/10/16   Jola Schmidt, MD    Family History Family History  Problem Relation Age of Onset  . Hypertension Mother   . Asthma Son   . Asthma Son   . Asthma Daughter     Social History Social History  Substance Use Topics  . Smoking status: Never Smoker  . Smokeless tobacco: Never Used  . Alcohol use No     Allergies   Hydrea [hydroxyurea]; Latex; Sulfa antibiotics; and Adhesive [tape]   Review of Systems Review of Systems  All other systems reviewed and are negative.    Physical Exam Updated Vital Signs BP (!) 161/102   Pulse 64   Temp 97.7 F (36.5 C) (Oral)   Resp (!) 21   Ht 5\' 6"  (1.676 m)   Wt 196 lb (88.9 kg)   SpO2 97%   BMI 31.64 kg/m   Physical Exam  Constitutional: She is oriented to person, place, and time. She appears well-developed and well-nourished. No distress.  HENT:  Head: Normocephalic and atraumatic.  Eyes: EOM are normal.  Neck: Normal range of motion.  Cardiovascular: Normal rate, regular rhythm and normal heart sounds.   Pulmonary/Chest: Effort normal and breath sounds normal.  Abdominal: Soft. She exhibits no distension. There is no tenderness.  Musculoskeletal: Normal range of motion.  Neurological: She is alert and oriented to person, place, and time.  Skin: Skin is warm and dry.  Psychiatric: She has a normal mood and affect. Judgment normal.  Nursing note and vitals reviewed.    ED Treatments / Results  Labs (all labs ordered are listed, but only  abnormal results are displayed) Labs Reviewed  COMPREHENSIVE METABOLIC PANEL - Abnormal; Notable for the following:       Result Value   Potassium 2.5 (*)    Glucose, Bld 179 (*)    Total Protein 8.4 (*)    Total Bilirubin 2.8 (*)    All other components within normal limits  CBC WITH DIFFERENTIAL/PLATELET - Abnormal; Notable for the following:    HCT 34.8 (*)    MCV 74.2 (*)    MCH 25.6 (*)    RDW 18.0 (*)    Neutro Abs 8.8 (*)  All other components within normal limits  RETICULOCYTES - Abnormal; Notable for the following:    Retic Ct Pct 4.5 (*)    Retic Count, Manual 211.1 (*)    All other components within normal limits  URINALYSIS, ROUTINE W REFLEX MICROSCOPIC - Abnormal; Notable for the following:    Color, Urine AMBER (*)    APPearance HAZY (*)    Hgb urine dipstick MODERATE (*)    Bilirubin Urine MODERATE (*)    Protein, ur >=300 (*)    Leukocytes, UA LARGE (*)    Bacteria, UA RARE (*)    Squamous Epithelial / LPF 0-5 (*)    All other components within normal limits  PREGNANCY, URINE    EKG  EKG Interpretation None       Radiology No results found.  Procedures Procedures (including critical care time)  Medications Ordered in ED Medications  potassium chloride 10 mEq in 100 mL IVPB (not administered)  ondansetron (ZOFRAN) injection 4 mg (4 mg Intravenous Given 09/10/16 1059)  sodium chloride 0.9 % bolus 1,000 mL (0 mLs Intravenous Stopped 09/10/16 1312)  HYDROmorphone (DILAUDID) injection 1 mg (1 mg Intravenous Given 09/10/16 1227)  HYDROmorphone (DILAUDID) injection 2 mg (2 mg Intravenous Given 09/10/16 1313)  metoCLOPramide (REGLAN) injection 10 mg (10 mg Intravenous Given 09/10/16 1313)  HYDROmorphone (DILAUDID) injection 2 mg (2 mg Intravenous Given 09/10/16 1508)  ondansetron (ZOFRAN) injection 4 mg (4 mg Intravenous Given 09/10/16 1546)  potassium chloride SA (K-DUR,KLOR-CON) CR tablet 60 mEq (60 mEq Oral Given 09/10/16 1546)     Initial Impression /  Assessment and Plan / ED Course  I have reviewed the triage vital signs and the nursing notes.  Pertinent labs & imaging results that were available during my care of the patient were reviewed by me and considered in my medical decision making (see chart for details).     Patient continues to have ongoing discomfort and pain at this time and does not feel good enough to go home.  Given her complex history and her history of sickle cell related pain I will admit the patient to the hospital for ongoing symptom control and IV replacement of her potassium which likely is adding to her generalized weakness.  I spoke with Dr. Zigmund Daniel, sickle cell hospitalist, who will admit the patient to the hospital  Final Clinical Impressions(s) / ED Diagnoses   Final diagnoses:  Sickle cell pain crisis ([place])  Hypokalemia    New Prescriptions New Prescriptions   ONDANSETRON (ZOFRAN ODT) 8 MG DISINTEGRATING TABLET    Take 1 tablet (8 mg total) by mouth every 8 (eight) hours as needed for nausea or vomiting.   POTASSIUM CHLORIDE 20 MEQ TBCR    Take 20 mEq by mouth 2 (two) times daily.     Jola Schmidt, MD 09/10/16 1630

## 2016-09-10 NOTE — ED Triage Notes (Signed)
Pt reports sickle cell pain crisis for the past three days.  Pt went to the sickle cell clinic yesterday for tx and was told to come here today if she was still in pain.  Pt reports pain in her abd, left leg, and lower back. Pt also reports multiple episodes of vomiting and diarrhea that began this morning.  Rates pain 9 out of 10. Pt reports weakness but was ambulatory in triage.

## 2016-09-10 NOTE — Telephone Encounter (Signed)
Pt called requesting to come to the [facility] for treatment. Pt states that she is still having abdomen and some diarrhea. She also said that she is not keeping her medications down. She tried 3x's to take her pain med but was unable to keep it down. She states her pain # is 9/10. She denies fever,and chest pain. Placed pt on hold and check with the provider, pt voiced understanding.  After checking with the provider, pt was told that she needed to be seen in the emergency. Pt voiced understanding.

## 2016-09-10 NOTE — ED Notes (Signed)
Pt states " I don't feel well enough to go home."

## 2016-09-11 LAB — BASIC METABOLIC PANEL
ANION GAP: 5 (ref 5–15)
BUN: 12 mg/dL (ref 6–20)
CO2: 29 mmol/L (ref 22–32)
Calcium: 8.5 mg/dL — ABNORMAL LOW (ref 8.9–10.3)
Chloride: 105 mmol/L (ref 101–111)
Creatinine, Ser: 0.8 mg/dL (ref 0.44–1.00)
GFR calc Af Amer: >60 mL/min (ref >60)
GLUCOSE: 112 mg/dL — AB (ref 65–99)
POTASSIUM: 3.1 mmol/L — AB (ref 3.5–5.1)
Sodium: 139 mmol/L (ref 135–145)

## 2016-09-11 MED ORDER — POTASSIUM CHLORIDE CRYS ER 20 MEQ PO TBCR
40.0000 meq | EXTENDED_RELEASE_TABLET | ORAL | Status: AC
  Administered 2016-09-11 (×2): 40 meq via ORAL
  Filled 2016-09-11 (×2): qty 2

## 2016-09-11 MED ORDER — MAGNESIUM SULFATE 2 GM/50ML IV SOLN
2.0000 g | Freq: Once | INTRAVENOUS | Status: AC
  Administered 2016-09-11: 2 g via INTRAVENOUS
  Filled 2016-09-11: qty 50

## 2016-09-11 MED ORDER — MORPHINE SULFATE 15 MG PO TABS
15.0000 mg | ORAL_TABLET | ORAL | Status: DC
  Administered 2016-09-11 – 2016-09-12 (×7): 15 mg via ORAL
  Filled 2016-09-11 (×7): qty 1

## 2016-09-11 MED ORDER — FOLIC ACID 1 MG PO TABS
1.0000 mg | ORAL_TABLET | Freq: Every day | ORAL | Status: DC
  Administered 2016-09-11 – 2016-09-12 (×2): 1 mg via ORAL
  Filled 2016-09-11 (×2): qty 1

## 2016-09-11 NOTE — Care Management Note (Signed)
Case Management Note  Patient Details  Name: Alisandra Son MRN: [id number] Date of Birth: 07/02/1970  Subjective/Objective:     ssc               Action/Plan: Date:  Sep 11, 2016 Chart reviewed for concurrent status and case management needs. Will continue to follow patient progress. Discharge Planning: following for needs Expected discharge date: [id number] Velva Harman, BSN, [place], [place]  Expected Discharge Date:   (unknown)               Expected Discharge Plan:     In-House Referral:     Discharge planning Services     Post Acute Care Choice:    Choice offered to:     DME Arranged:    DME Agency:     HH Arranged:    [place] Agency:     Status of Service:     If discussed at [employer] of Stay Meetings, dates discussed:    Additional Comments:  Leeroy Cha, RN 09/11/2016, 8:41 AM

## 2016-09-11 NOTE — Progress Notes (Signed)
SICKLE CELL SERVICE PROGRESS NOTE  Naleigha Raimondi ZHY:[id number] DOB: 1970/07/14 DOA: 09/10/2016 PCP: Dorena Dew, FNP  Assessment/Plan: Active Problems:   Hb-S/hb-C disease with crisis ([place])   Hypokalemia   Proteinuria   Hypokalemia due to loss of potassium  1. Intractable Emesis: Resolved. Patient tolerating clear liquids. Will advance diet as tolerated. 2. Hb Evaro with crisis: Continue PCA, Toradol and IVF.  Schedule MS IR. Anticipate discharge home tomorrow. 3. Hypokalemia: Improved. Will continue replacement orally.  4. Hypomagnesemia: Replace by IV.  5. Elevated BP: Resolved. Associated with pain??? 6. Proteinuria: 24 hour collection in process. 7. Constipation: Pt wants to try prune juice instead of a laxative.  Code Status: Full Code Family Communication: N/A Disposition Plan: Anticipate discharge home tomorrow  Maecie Sevcik A.  Pager [phone]. If 7PM-7AM, please contact night-coverage.  09/11/2016, 2:47 PM  LOS: 1 day   Interim History: Pt states nausea and emesis improved. She is having sweats which she was told was due to menopause.  Consultants:  None  Procedures:  None  Antibiotics:  None   Objective: Vitals:   09/11/16 0804 09/11/16 0805 09/11/16 1317 09/11/16 1408  BP:  (!) 128/54  132/69  Pulse:  65  66  Resp: 15 11 13 16   Temp:  98.8 F (37.1 C)  98.5 F (36.9 C)  TempSrc:  Oral  Oral  SpO2: 100% 100%  100%  Weight:      Height:       Weight change:   Intake/Output Summary (Last 24 hours) at 09/11/16 1447 Last data filed at 09/11/16 3244  Gross per 24 hour  Intake          1601.67 ml  Output              300 ml  Net          1301.67 ml      Physical Exam General: Alert, awake, oriented x3, in no acute distress.  HEENT: [place]/AT PEERL, EOMI, anicteric Neck: Trachea midline,  no masses, no thyromegal,y no JVD, no carotid bruit OROPHARYNX:  Moist, No exudate/ erythema/lesions.  Heart: Regular rate and rhythm, without murmurs,  rubs, gallops, PMI non-displaced, no heaves or thrills on palpation.  Lungs: Clear to auscultation, no wheezing or rhonchi noted. No increased vocal fremitus resonant to percussion  Abdomen: Soft, nontender, nondistended, positive bowel sounds, no masses no hepatosplenomegaly noted.  Neuro: No focal neurological deficits noted cranial nerves II through XII grossly intact. Strength at baseline in bilateral upper and lower extremities. Musculoskeletal: No warmth swelling or erythema around joints, no spinal tenderness noted. Psychiatric: Patient alert and oriented x3, good insight and cognition, good recent to remote recall.    Data Reviewed: Basic Metabolic Panel:  Recent Labs Lab 09/09/16 1105 09/10/16 1057 09/11/16 1302  NA 139 139 139  K 2.5* 2.5* 3.1*  CL 101 102 105  CO2 30 26 29   GLUCOSE 103* 179* 112*  BUN 8 10 12   CREATININE 0.57 0.66 0.80  CALCIUM 8.8* 9.4 8.5*  MG  --  1.6*  --    Liver Function Tests:  Recent Labs Lab 09/09/16 1105 09/10/16 1057  AST 26 28  ALT 23 25  ALKPHOS 65 74  BILITOT 1.9* 2.8*  PROT 7.3 8.4*  ALBUMIN 4.1 4.7   No results for input(s): LIPASE, AMYLASE in the last 168 hours. No results for input(s): AMMONIA in the last 168 hours. CBC:  Recent Labs Lab 09/09/16 1105 09/10/16 1057  WBC 7.3 10.3  NEUTROABS  4.8 8.8*  HGB 10.5* 12.0  HCT 31.2* 34.8*  MCV 74.8* 74.2*  PLT 129* 155   Cardiac Enzymes: No results for input(s): CKTOTAL, CKMB, CKMBINDEX, TROPONINI in the last 168 hours. BNP (last 3 results) No results for input(s): BNP in the last 8760 hours.  ProBNP (last 3 results) No results for input(s): PROBNP in the last 8760 hours.  CBG: No results for input(s): GLUCAP in the last 168 hours.  No results found for this or any previous visit (from the past 240 hour(s)).   Studies: No results found.  Scheduled Meds: . enoxaparin (LOVENOX) injection  40 mg Subcutaneous Q24H  . escitalopram  20 mg Oral Daily  . folic  acid  1 mg Oral Daily  . HYDROmorphone   Intravenous Q4H  . ketorolac  15 mg Intravenous Q8H  . morphine  15 mg Oral Q4H  . pantoprazole (PROTONIX) IV  40 mg Intravenous QHS  . potassium chloride  40 mEq Oral Q3H  . sodium chloride flush  3 mL Intravenous Q12H   Continuous Infusions: . dextrose 5 % and 0.45 % NaCl with KCl 40 mEq/L 100 mL/hr at 09/11/16 0804  . diphenhydrAMINE (BENADRYL) IVPB(SICKLE CELL ONLY)    . magnesium sulfate 1 - 4 g bolus IVPB      Active Problems:   Hb-S/hb-C disease with crisis (HCC)   Hypokalemia   Proteinuria   Hypokalemia due to loss of potassium    In excess of 25 minutes spent during this visit. Greater than 50% involved face to face contact with the patient for assessment, counseling and coordination of care.

## 2016-09-12 ENCOUNTER — Other Ambulatory Visit: Payer: Self-pay | Admitting: Family Medicine

## 2016-09-12 ENCOUNTER — Other Ambulatory Visit: Payer: Self-pay | Admitting: Internal Medicine

## 2016-09-12 ENCOUNTER — Telehealth: Payer: Self-pay

## 2016-09-12 LAB — PAIN MGMT, PROFILE 8 W/CONF, U
6 ACETYLMORPHINE: NEGATIVE ng/mL (ref <10)
ALCOHOL METABOLITES: NEGATIVE ng/mL (ref <500)
AMPHETAMINES: NEGATIVE ng/mL (ref <500)
BUPRENORPHINE: NEGATIVE ng/mL (ref <5)
Benzodiazepines: NEGATIVE ng/mL (ref <100)
Buprenorphine: NEGATIVE ng/mL (ref <2)
COCAINE METABOLITE: NEGATIVE ng/mL (ref <150)
CREATININE: 88.9 mg/dL (ref >=20.0)
Codeine: 7537 ng/mL — ABNORMAL HIGH (ref <50)
HYDROCODONE: 154 ng/mL — AB (ref <50)
Hydromorphone: 2165 ng/mL — ABNORMAL HIGH (ref <50)
MARIJUANA METABOLITE: 520 ng/mL — AB (ref <5)
MARIJUANA METABOLITE: POSITIVE ng/mL — AB (ref <20)
MDMA: NEGATIVE ng/mL (ref <500)
Morphine: >50000 ng/mL — ABNORMAL HIGH (ref <50)
NORHYDROCODONE: 191 ng/mL — AB (ref <50)
Norbuprenorphine: NEGATIVE ng/mL (ref <2)
Noroxycodone: 290 ng/mL — ABNORMAL HIGH (ref <50)
OXIDANT: NEGATIVE ug/mL (ref <200)
Opiates: POSITIVE ng/mL — AB (ref <100)
Oxycodone: 57 ng/mL — ABNORMAL HIGH (ref <50)
Oxycodone: POSITIVE ng/mL — AB (ref <100)
Oxymorphone: 61 ng/mL — ABNORMAL HIGH (ref <50)
Please note:: 0
pH: 6.81 (ref 4.5–9.0)

## 2016-09-12 LAB — BASIC METABOLIC PANEL
ANION GAP: 4 — AB (ref 5–15)
BUN: 11 mg/dL (ref 6–20)
CHLORIDE: 108 mmol/L (ref 101–111)
CO2: 28 mmol/L (ref 22–32)
Calcium: 8.4 mg/dL — ABNORMAL LOW (ref 8.9–10.3)
Creatinine, Ser: 0.69 mg/dL (ref 0.44–1.00)
GFR calc non Af Amer: >60 mL/min (ref >60)
Glucose, Bld: 102 mg/dL — ABNORMAL HIGH (ref 65–99)
POTASSIUM: 4.3 mmol/L (ref 3.5–5.1)
Sodium: 140 mmol/L (ref 135–145)

## 2016-09-12 LAB — PROTEIN, URINE, 24 HOUR
Collection Interval-UPROT: 24 hours
Protein, 24H Urine: 1615 mg/d — ABNORMAL HIGH (ref 50–100)
Protein, Urine: 91 mg/dL
Urine Total Volume-UPROT: 1775 mL

## 2016-09-12 LAB — MAGNESIUM: Magnesium: 2.2 mg/dL (ref 1.7–2.4)

## 2016-09-12 MED ORDER — MORPHINE SULFATE ER 15 MG PO TBCR: 15.0000 mg | EXTENDED_RELEASE_TABLET | Freq: Two times a day (BID) | ORAL | Status: DC

## 2016-09-12 MED ORDER — MORPHINE SULFATE 15 MG PO TABS: 15.0000 mg | ORAL_TABLET | ORAL | 0 refills | Status: DC | PRN

## 2016-09-12 MED ORDER — PANTOPRAZOLE SODIUM 40 MG PO TBEC: 40.0000 mg | DELAYED_RELEASE_TABLET | Freq: Every day | ORAL | Status: DC

## 2016-09-12 MED ORDER — BISACODYL 10 MG RE SUPP
10.0000 mg | Freq: Once | RECTAL | Status: AC
  Administered 2016-09-12: 10 mg via RECTAL
  Filled 2016-09-12: qty 1

## 2016-09-12 NOTE — Progress Notes (Signed)
Date: Sep 12, 2016 Discharge orders review for case management needs.  None found Velva Harman, BSN, [place], [place]: [phone]

## 2016-09-12 NOTE — Discharge Summary (Addendum)
Cassie Montgomery MRN: [id number] DOB/AGE: 1971/01/13 46 y.o.  Admit date: 09/10/2016 Discharge date: 09/12/2016  Primary Care Physician:  Cassie Dew, FNP   Discharge Diagnoses:   Patient Active Problem List   Diagnosis Date Noted  . Proteinuria 09/10/2016  . Menopausal syndrome 04/09/2016  . Perimenopausal symptoms 04/09/2016  . Periorbital edema 10/10/2015  . Dyslipidemia 10/10/2015  . Chronic left shoulder pain 09/06/2015  . Vitamin D deficiency 06/28/2014  . GERD (gastroesophageal reflux disease) 06/12/2014  . Asthma in adult   . Chronic prescription opiate use 01/12/2014  . Depression, major 10/07/2013  . PTSD (post-traumatic stress disorder) 10/07/2013  . Weight gain 09/27/2013  . Irregular menstrual cycle 05/12/2013  . Panic anxiety syndrome 05/11/2013  . Insomnia 05/11/2013  . Pap smear for cervical cancer screening 05/11/2013  . Chronic pain syndrome 01/06/2013  . Generalized anxiety disorder 10/20/2012  . Opiate analgesic contract exists 09/11/2012    DISCHARGE MEDICATION: Allergies as of 09/12/2016      Reactions   Hydrea [hydroxyurea] Nausea Only   Latex Swelling, Other (See Comments)   Reaction:  Localized swelling    Sulfa Antibiotics Other (See Comments)   Reaction:  Increased bilirubin    Adhesive [tape] Rash      Medication List    TAKE these medications   acetaminophen 500 MG tablet Commonly known as:  TYLENOL Take 1,000 mg by mouth every 6 (six) hours as needed for mild pain, moderate pain, fever or headache.   albuterol 108 (90 Base) MCG/ACT inhaler Commonly known as:  PROVENTIL HFA;VENTOLIN HFA Inhale 1-2 puffs into the lungs every 6 (six) hours as needed for wheezing or shortness of breath (and/or cough).   diphenhydrAMINE 25 MG tablet Commonly known as:  BENADRYL Take 1-2 tablets (25-50 mg total) by mouth at bedtime as needed for sleep.   escitalopram 20 MG tablet Commonly known as:  LEXAPRO Take 1 tablet (20 mg total) by mouth  daily.   folic acid 1 MG tablet Commonly known as:  FOLVITE Take 1 mg by mouth daily.   morphine 15 MG tablet Commonly known as:  MSIR Take 1 tablet (15 mg total) by mouth every 4 (four) hours as needed for severe pain. What changed:  Another medication with the same name was added. Make sure you understand how and when to take each.   morphine 15 MG 12 hr tablet Commonly known as:  MS CONTIN Take 1 tablet (15 mg total) by mouth every 12 (twelve) hours. What changed:  You were already taking a medication with the same name, and this prescription was added. Make sure you understand how and when to take each.   omeprazole 20 MG capsule Commonly known as:  PRILOSEC Take 1 capsule (20 mg total) by mouth 2 (two) times daily before a meal.   promethazine 25 MG tablet Commonly known as:  PHENERGAN Take 1 tablet (25 mg total) by mouth every 8 (eight) hours as needed for nausea or vomiting.         Consults:    SIGNIFICANT DIAGNOSTIC STUDIES:  No results found.       No results found for this or any previous visit (from the past 240 hour(s)).  BRIEF ADMITTING H & P: This is a patient with Hb [name] who has almost daily emesis which sometimes abates and sometimes continues for several days.  Pt had an episode which started 4 days ago which become progressively worse. She has had more than 20 episodes of bilious emesis but  no blood. Pt states that the emesis is usually associated with a crisis and that it is preceded by hot flashes and sweats and is sometimes associated with diarrhea.  Pt has had 6  diarrheal stools which started today. There is no associated fevers, cough, SOB, HA or focal weakness.  This often triggers a sickle cell crisis and what has compounded the situation is that she was unable to obtain her pain medications due to issues with her insurance.  Currently she has pain localized to the epigastric region, back and legs. She rates pain at 7/10 and describes pain as if he  stomach is bing squeezed and twisted. Pain in back and legs are throbbing in nature, non-radiating and associated only with the emesis   Hospital Course:  Present on Admission: . (Resolved) Hypokalemia . 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 Proteinuria . (Resolved) Hypokalemia due to loss of potassium  Pt was initially having intractable emesis the reason for which is unclear. This resulted in dehydration, hypokalemia, hypomagnesemia, and in sickle cell crisis. She was admitted and given bowel rest, IVF and her pain was treated with IV Dilaudid. As emesis improved, the diet was advanced and she was able to tolerate her diet. Potassium and magnesium were replaced and at the time of discharge, she was tolerating a regular diet. Her potassium and Magnesium were both WNL and her pain was at 6/10 which per patient is manageable with her oral medications. The patient had transiently elevated BP however as her pain was controlled her elevated BP resolved. Pt reported that she had difficulty in obtaining her Oxycontin and the Case Manager spoke with [facility] and they noted that she soul have no difficulty with obtaining medication.   Disposition and Follow-up: Pt discharged home in good condition. She is to follow up with her Primary Provider as scheduled.   DISCHARGE EXAM:  General: Alert, awake, oriented x3, in no apparent distress.  HEENT: [place]/AT PEERL, EOMI, anicteric, Pt had what appears to be fat pads in the B/L infra-orbital area Neck: Trachea midline, no masses, no thyromegal,y no JVD, no carotid bruit OROPHARYNX: Moist, No exudate/ erythema/lesions.  Heart: Regular rate and rhythm, without murmurs, rubs, gallops or S3. PMI non-displaced. Exam reveals no decreased pulses. Pulmonary/Chest: Normal effort. Breath sounds normal. No. Apnea. Clear to auscultation,no stridor,  no wheezing and no rhonchi noted. No respiratory distress and no tenderness noted. Abdomen: Soft,  nontender, nondistended, normal bowel sounds, no masses no hepatosplenomegaly noted. No fluid wave and no ascites. There is no guarding or rebound. Neuro: Alert and oriented to person, place and time. Normal motor skills, Displays no atrophy or tremors and exhibits normal muscle tone.  No focal neurological deficits noted cranial nerves II through XII grossly intact. No sensory deficit noted.  Strength at baseline in bilateral upper and lower extremities. Gait normal. Musculoskeletal: No warmth swelling or erythema around joints, no spinal tenderness noted. Psychiatric: Patient alert and oriented x3, good insight and cognition, good recent to remote recall. Mood, affect and judgement normal Skin: Skin is warm and dry. No bruising, no ecchymosis and no rash noted. Pt is not diaphoretic. No erythema. No pallor   Blood pressure 129/90, pulse 64, temperature 98.4 F (36.9 C), temperature source Oral, resp. rate 18, height 5\' 6"  (1.676 m), weight 88.9 kg (196 lb), SpO2 98 %.   Recent Labs  09/10/16 1057 09/11/16 1302 09/12/16 1057  NA 139 139 140  K 2.5* 3.1* 4.3  CL 102 105 108  CO2 26 29 28   GLUCOSE 179* 112* 102*  BUN 10 12 11   CREATININE 0.66 0.80 0.69  CALCIUM 9.4 8.5* 8.4*  MG 1.6*  --  2.2    Recent Labs  09/10/16 1057  AST 28  ALT 25  ALKPHOS 74  BILITOT 2.8*  PROT 8.4*  ALBUMIN 4.7   No results for input(s): LIPASE, AMYLASE in the last 72 hours.  Recent Labs  09/10/16 1057  WBC 10.3  NEUTROABS 8.8*  HGB 12.0  HCT 34.8*  MCV 74.2*  PLT 155     Total time spent including face to face and decision making was greater than 30 minutes  Signed: Cricket Goodlin A. 09/12/2016, 1:06 PM

## 2016-09-12 NOTE — Progress Notes (Signed)
PHARMACIST - PHYSICIAN COMMUNICATION    CONCERNING: IV to Oral Route Change Policy  RECOMMENDATION: This patient is receiving protonix by the intravenous route.  Based on criteria approved by the Pharmacy and Therapeutics Committee, the intravenous medication(s) is/are being converted to the equivalent oral dose form(s).   DESCRIPTION: These criteria include:  The patient is eating (either orally or via tube) and/or has been taking other orally administered medications for a least 24 hours  The patient has no evidence of active gastrointestinal bleeding or impaired GI absorption (gastrectomy, short bowel, patient on TNA or NPO).  If you have questions about this conversion, please contact the Pharmacy Department  []   [phone] )  [facility] []   [phone] )  [facility] []   [phone] )  [facility] []   [phone] )  [facility] []   [phone] )  [place] 09/12/2016, 11:16 AM Pager [phone]

## 2016-09-12 NOTE — Telephone Encounter (Signed)
Please schedule patient for appointment to discuss her pain medication. There is confusion about MS Contin, Morphine sulfate and Oxycodone.

## 2016-09-12 NOTE — Progress Notes (Signed)
Pt discharged home. VSS. Discharge instructions given and pt verbalized understanding.

## 2016-09-13 ENCOUNTER — Ambulatory Visit: Payer: Medicare Other

## 2016-09-13 ENCOUNTER — Other Ambulatory Visit: Payer: Self-pay | Admitting: Family Medicine

## 2016-09-13 VITALS — BP 146/88 | HR 75 | Temp 98.7°F | Resp 16

## 2016-09-13 DIAGNOSIS — Z79899 Other long term (current) drug therapy: Secondary | ICD-10-CM

## 2016-09-13 DIAGNOSIS — D5700 Hb-SS disease with crisis, unspecified: Secondary | ICD-10-CM

## 2016-09-13 MED ORDER — OXYCODONE HCL 10 MG PO TABS: 10.0000 mg | ORAL_TABLET | ORAL | 0 refills | Status: DC | PRN

## 2016-09-13 MED FILL — oxyCODONE HCL 10 MG TABS: 10 | 15 days supply | Qty: 90 | Fill #0

## 2016-09-13 MED FILL — MORPHINE SULF ER 15 MG TAB: 15 | 30 days supply | Qty: 60 | Fill #0

## 2016-09-13 NOTE — Progress Notes (Signed)
Reviewed [place] Substance Reporting system prior to prescribing opiate medications. No inconsistencies noted.   

## 2016-09-26 ENCOUNTER — Telehealth: Payer: Self-pay

## 2016-09-27 ENCOUNTER — Telehealth: Payer: Self-pay

## 2016-09-27 ENCOUNTER — Other Ambulatory Visit: Payer: Self-pay | Admitting: Family Medicine

## 2016-09-27 DIAGNOSIS — D5700 Hb-SS disease with crisis, unspecified: Secondary | ICD-10-CM

## 2016-09-27 MED ORDER — OXYCODONE HCL 10 MG PO TABS: 10.0000 mg | ORAL_TABLET | ORAL | 0 refills | Status: DC | PRN

## 2016-09-27 MED FILL — oxyCODONE HCL 10 MG TABS: 10 | 15 days supply | Qty: 90 | Fill #0

## 2016-09-27 NOTE — Progress Notes (Signed)
Reviewed  [facility] Substance Reporting system prior to prescribing opiate medications. No inconsistencies noted.    Meds ordered this encounter  Medications  . Oxycodone HCl 10 MG TABS    Sig: Take 1 tablet (10 mg total) by mouth every 4 (four) hours as needed.    Dispense:  90 tablet    Refill:  0    Order Specific Question:   Supervising Provider    Answer:   Tresa Garter [[id number]]     Donia Pounds  MSN, FNP-C [address] [place], [place] [address] [phone]

## 2016-10-02 ENCOUNTER — Other Ambulatory Visit: Payer: Self-pay | Admitting: Internal Medicine

## 2016-10-09 ENCOUNTER — Ambulatory Visit: Payer: Medicare Other | Admitting: Family Medicine

## 2016-10-10 ENCOUNTER — Encounter: Payer: Self-pay | Admitting: Family Medicine

## 2016-10-10 ENCOUNTER — Ambulatory Visit (HOSPITAL_COMMUNITY)
Admission: RE | Admit: 2016-10-10 | Discharge: 2016-10-10 | Disposition: A | Discharge status: Home / Self Care | Payer: Medicare Other | Source: Ambulatory Visit | Attending: Family Medicine | Admitting: Family Medicine

## 2016-10-10 ENCOUNTER — Ambulatory Visit (INDEPENDENT_AMBULATORY_CARE_PROVIDER_SITE_OTHER): Payer: Medicare Other | Admitting: Family Medicine

## 2016-10-10 ENCOUNTER — Other Ambulatory Visit: Payer: Self-pay | Admitting: Family Medicine

## 2016-10-10 VITALS — BP 133/85 | HR 73 | Temp 98.6°F | Resp 14 | Ht 66.0 in | Wt 197.0 lb

## 2016-10-10 DIAGNOSIS — R232 Flushing: Secondary | ICD-10-CM | POA: Diagnosis not present

## 2016-10-10 DIAGNOSIS — R053 Chronic cough: Secondary | ICD-10-CM

## 2016-10-10 DIAGNOSIS — R002 Palpitations: Secondary | ICD-10-CM

## 2016-10-10 DIAGNOSIS — D5700 Hb-SS disease with crisis, unspecified: Secondary | ICD-10-CM | POA: Insufficient documentation

## 2016-10-10 DIAGNOSIS — N951 Menopausal and female climacteric states: Secondary | ICD-10-CM

## 2016-10-10 DIAGNOSIS — R05 Cough: Secondary | ICD-10-CM | POA: Diagnosis not present

## 2016-10-10 DIAGNOSIS — R634 Abnormal weight loss: Secondary | ICD-10-CM

## 2016-10-10 LAB — POCT URINALYSIS DIP (DEVICE)
BILIRUBIN URINE: NEGATIVE
GLUCOSE, UA: NEGATIVE mg/dL
Hgb urine dipstick: NEGATIVE
KETONES UR: NEGATIVE mg/dL
LEUKOCYTES UA: NEGATIVE
Nitrite: NEGATIVE
Protein, ur: 30 mg/dL — AB
SPECIFIC GRAVITY, URINE: 1.015 (ref 1.005–1.030)
UROBILINOGEN UA: 2 mg/dL — AB (ref 0.0–1.0)
pH: 7 (ref 5.0–8.0)

## 2016-10-10 MED ORDER — MORPHINE SULFATE ER 15 MG PO TBCR: 15.0000 mg | EXTENDED_RELEASE_TABLET | Freq: Two times a day (BID) | ORAL | 0 refills | Status: DC

## 2016-10-10 MED ORDER — VENLAFAXINE HCL ER 37.5 MG PO CP24: 37.5000 mg | ORAL_CAPSULE | Freq: Every day | ORAL | 1 refills | Status: AC

## 2016-10-10 MED ORDER — OXYCODONE HCL 10 MG PO TABS: 10.0000 mg | ORAL_TABLET | ORAL | 0 refills | Status: DC | PRN

## 2016-10-10 MED FILL — VENLAFAXINE HCL ER 37.5 MG: 37.5 | 30 days supply | Qty: 30 | Fill #0

## 2016-10-10 NOTE — Patient Instructions (Signed)
Will discontinue Lexapro 20 mg and start Effexor for depression and menopausal hotflashes  Menopause:  Menopause is the time that marks the end of your menstrual cycles. It's diagnosed after you've gone 12 months without a menstrual period.  Menopause can happen in your 61s or 28s, but the average age is 49 in the [place]. Menopause is a natural biological process. But the physical symptoms, such as hot flashes, and emotional symptoms of menopause may disrupt your sleep, lower your energy or affect emotional health.  There are many effective treatments available, from lifestyle adjustments to hormone therapy.  Cool hot flashes. Dress in layers, have a cold glass of water or go somewhere cooler. Try to pinpoint what triggers your hot flashes. For many women, triggers may include hot beverages, caffeine, spicy foods, alcohol, stress, hot weather and even a warm room.  Decrease vaginal discomfort. Use over-the-counter, water-based vaginal lubricants (Astroglide, K-Y jelly, others), silicone-based lubricants or moisturizers (Replens, others). Choose products that don't contain glycerin, which can cause burning or irritation in women who are sensitive to that chemical. Staying sexually active also helps by increasing blood flow to the vagina.  Get enough sleep. Avoid caffeine, which can make it hard to get to sleep, and avoid drinking too much alcohol, which can interrupt sleep. Exercise during the day, although not right before bedtime. If hot flashes disturb your sleep, you may need to find a way to manage them before you can get adequate rest.   Practice relaxation techniques. Techniques such as deep breathing, paced breathing, guided imagery, massage and progressive muscle relaxation may help with menopausal symptoms. You can find a number of books, CDs and online offerings on different relaxation exercises.  Strengthen your pelvic floor. Pelvic floor muscle exercises, called Kegel exercises, can  improve some forms of urinary incontinence.  Eat a balanced diet. Include a variety of fruits, vegetables and whole grains. Limit saturated fats, oils and sugars. Ask your provider if you need calcium or vitamin D supplements to help meet daily requirements.  Don't smoke. Smoking increases your risk of heart disease, stroke, osteoporosis, cancer and a range of other health problems. It may also increase hot flashes and bring on earlier menopause.  Exercise regularly. Get regular physical activity or exercise on most days to help protect against heart disease, diabetes, osteoporosis and other conditions associated with aging.   Persistent cough:   Will send albuterol inhaler 2 puffs every 6 hours as needed.  Will follow up by phone with chest xray results

## 2016-10-10 NOTE — Progress Notes (Signed)
Subjective:    Patient ID: Cassie Montgomery, female    DOB: 08-15-1970, 46 y.o.   MRN: [id number]  HPI  [name], a 46 year old female with a history of sickle cell anemia, HbSC and chronic pain presents for medication management. She says that pain intensity has improved since adding a MS Contin to medication regimen. She has been taking folic acid consistently and hydrating. Current pain intensity is 5/10.  Cassie Montgomery is also complaining of a persistent cough over the past several weeks. She describes cough as dry. She has not been coughing sputum. She does not have a history of smoking. She has a history of asthma. She has not had an asthma exacerbation since she was a teenager. She does not have a current albuterol inhaler.   She is also complaining of hot flashes. She says that hot flashes have been worsening over the past several months. She is awakening throughout the night with "soaked sheets" Symptoms have not been alleviated on Lexapro. She has not had any hormone studies in the past. She is sexually active and has occasional vaginal dryness. She continues to have menstrual cycles.    Past Medical History:  Diagnosis Date  . Anxiety   . Asthma in adult    probable  . Chronic pain syndrome   . Depression   . Ganglion cyst   . GERD (gastroesophageal reflux disease)   . Left arm pain    chronic  . Migraine with aura 07/15/2011  . PMDD (premenstrual dysphoric disorder)   . Sickle cell anemia (HCC)   . Sickle cell disease without crisis ([place]) 07/15/2011   Social History   Social History  . Marital status: Single    Spouse name: N/A  . Number of children: 3  . Years of education: N/A   Occupational History  . Not on file.   Social History Main Topics  . Smoking status: Never Smoker  . Smokeless tobacco: Never Used  . Alcohol use No  . Drug use: No  . Sexual activity: Yes    Birth control/ protection: None   Other Topics Concern  . Not on file   Social  History Narrative   Uses a cane occasionally.  Does not work.     Review of Systems  Constitutional: Positive for fatigue.  HENT: Negative.   Eyes: Negative.  Negative for photophobia.  Respiratory: Positive for cough.   Cardiovascular: Negative for chest pain, palpitations and leg swelling.  Gastrointestinal: Positive for abdominal pain.  Endocrine: Negative.  Negative for polydipsia, polyphagia and polyuria.       Diaphoresis/Hot flushes Weight loss Vaginal dryness  Genitourinary: Negative.   Musculoskeletal: Negative.   Skin: Negative.   Hematological: Negative.        Objective:   Physical Exam  HENT:  Head: Normocephalic and atraumatic.  Right Ear: External ear normal.  Left Ear: External ear normal.  Nose: Nose normal.  Mouth/Throat: Oropharynx is clear and moist.  Eyes: Conjunctivae and EOM are normal. Pupils are equal, round, and reactive to light.  Neck: Normal range of motion. Neck supple.  Cardiovascular: Normal rate, regular rhythm, normal heart sounds and intact distal pulses.   Pulmonary/Chest: Effort normal and breath sounds normal.  Abdominal: Soft. Bowel sounds are normal. She exhibits no distension.  Psychiatric: Her speech is normal and behavior is normal. Judgment and thought content normal.      BP 133/85 (BP Location: Left Arm, Patient Position: Sitting, Cuff Size: Normal)  Pulse 73   Temp 98.6 F (37 C) (Oral)   Resp 14   Ht 5\' 6"  (1.676 m)   Wt 197 lb (89.4 kg)   SpO2 99%   BMI 31.80 kg/m  Assessment & Plan:  1. Sickle cell anemia with pain (HCC) Acute and chronic painful episodes - We agreed on current medication dosage. We discussed that pt is to receive her Schedule II prescriptions only from [place]. Pt is also aware that the prescription history is available to [place] online through the [facility] CSRS. Controlled substance agreement signed previously. We reminded Cassie Montgomery that all patients receiving Schedule II narcotics must be seen for follow within one  month of prescription being requested. We reviewed the terms of our pain agreement, including the need to keep medicines in a safe locked location away from children or pets, and the need to report excess sedation or constipation, measures to avoid constipation, and policies related to early refills and stolen prescriptions. According to the [place] [facility] program, we have reviewed details related to analgesia, adverse effects, aberrant behaviors.  - DG Chest 2 View; Future - CBC with Differential - COMPLETE METABOLIC PANEL WITH GFR - Oxycodone HCl 10 MG TABS; Take 1 tablet (10 mg total) by mouth every 4 (four) hours as needed.  Dispense: 90 tablet; Refill: 0 - morphine (MS CONTIN) 15 MG 12 hr tablet; Take 1 tablet (15 mg total) by mouth every 12 (twelve) hours.  Dispense: 60 tablet; Refill: 0  2. Persistent cough I will review chest xray due to sickle cell anemia.  Also, Cassie Montgomery has a history of asthma. I will send in a rescue inhaler to pharmacy.  - DG Chest 2 View; Future  3. Menopausal flushing - venlafaxine XR (EFFEXOR XR) 37.5 MG 24 hr capsule; Take 1 capsule (37.5 mg total) by mouth daily with breakfast.  Dispense: 30 capsule; Refill: 1  4. Hot flashes Will review hormone studies and start a trial of effexor to alleviate current symptoms.  - FSH/LH - venlafaxine XR (EFFEXOR XR) 37.5 MG 24 hr capsule; Take 1 capsule (37.5 mg total) by mouth daily with breakfast.  Dispense: 30 capsule; Refill: 1  5. Weight loss - Thyroid Panel With TSH  6. Heart palpitations - Thyroid Panel With TSH   RTC: 1 month for medication management   Cassie Pounds  MSN, FNP-C [place] [address] [place], [place] [address] [phone]

## 2016-10-11 ENCOUNTER — Telehealth: Payer: Self-pay

## 2016-10-11 LAB — COMPLETE METABOLIC PANEL WITH GFR
ALBUMIN: 4.3 g/dL (ref 3.6–5.1)
ALK PHOS: 63 U/L (ref 33–115)
ALT: 11 U/L (ref 6–29)
AST: 13 U/L (ref 10–35)
BUN: 9 mg/dL (ref 7–25)
CHLORIDE: 103 mmol/L (ref 98–110)
CO2: 27 mmol/L (ref 20–31)
Calcium: 9.4 mg/dL (ref 8.6–10.2)
Creat: 0.71 mg/dL (ref 0.50–1.10)
GFR, Est African American: >89 mL/min (ref >=60)
Glucose, Bld: 78 mg/dL (ref 65–99)
POTASSIUM: 3.8 mmol/L (ref 3.5–5.3)
SODIUM: 141 mmol/L (ref 135–146)
Total Bilirubin: 2.3 mg/dL — ABNORMAL HIGH (ref 0.2–1.2)
Total Protein: 7 g/dL (ref 6.1–8.1)

## 2016-10-11 LAB — CBC WITH DIFFERENTIAL/PLATELET
Basophils Absolute: 0 cells/uL (ref 0–200)
Basophils Relative: 0 %
EOS PCT: 3 %
Eosinophils Absolute: 264 cells/uL (ref 15–500)
HCT: 36.6 % (ref 35.0–45.0)
HEMOGLOBIN: 11.6 g/dL — AB (ref 11.7–15.5)
LYMPHS ABS: 3168 {cells}/uL (ref 850–3900)
LYMPHS PCT: 36 %
MCH: 25 pg — AB (ref 27.0–33.0)
MCHC: 31.7 g/dL — AB (ref 32.0–36.0)
MCV: 78.9 fL — ABNORMAL LOW (ref 80.0–100.0)
MONOS PCT: 5 %
Monocytes Absolute: 440 cells/uL (ref 200–950)
NEUTROS PCT: 56 %
Neutro Abs: 4928 cells/uL (ref 1500–7800)
Platelets: 165 10*3/uL (ref 140–400)
RBC: 4.64 MIL/uL (ref 3.80–5.10)
RDW: 18.6 % — AB (ref 11.0–15.0)
WBC: 8.8 10*3/uL (ref 3.8–10.8)

## 2016-10-11 LAB — FSH/LH
FSH: 32.9 m[IU]/mL
LH: 22.9 m[IU]/mL

## 2016-10-11 LAB — THYROID PANEL WITH TSH
FREE THYROXINE INDEX: 2.2 (ref 1.4–3.8)
T3 UPTAKE: 28 % (ref 22–35)
T4 TOTAL: 7.7 ug/dL (ref 4.5–12.0)
TSH: 1.33 m[IU]/L

## 2016-10-11 MED FILL — oxyCODONE HCL 10 MG TABS: 10 | 15 days supply | Qty: 90 | Fill #0

## 2016-10-11 NOTE — Telephone Encounter (Signed)
-----   Message from Dorena Dew, [place] sent at 10/11/2016 12:13 PM EDT ----- Regarding: lab results Please inform Diarra that chest xray was unremarkable. All hormone studies were negative. I sent a referral to endocrinology for further workup and evaluation. Follow up in office as scheduled.   Thanks ----- Message ----- From: Interface, Lab In [facility] [place] [facility] Sent: 10/11/2016   4:11 AM To: Dorena Dew, FNP

## 2016-10-11 NOTE — Telephone Encounter (Signed)
Called, no answer. Left message advising patient of no findings in labs or xray and that we will refer her to endocrinology for further work up. Asked if and questions to call back to our office. Thanks!

## 2016-10-14 MED FILL — MORPHINE SULF ER 15 MG TAB: 15 | 30 days supply | Qty: 60 | Fill #0

## 2016-10-18 LAB — ESTROGENS, TOTAL: ESTROGEN: 254 pg/mL

## 2016-10-21 ENCOUNTER — Other Ambulatory Visit: Payer: Self-pay | Admitting: Internal Medicine

## 2016-10-23 ENCOUNTER — Telehealth: Payer: Self-pay

## 2016-10-25 ENCOUNTER — Other Ambulatory Visit: Payer: Self-pay | Admitting: Family Medicine

## 2016-10-25 DIAGNOSIS — D5700 Hb-SS disease with crisis, unspecified: Secondary | ICD-10-CM

## 2016-10-25 MED ORDER — OXYCODONE HCL 10 MG PO TABS: 10.0000 mg | ORAL_TABLET | ORAL | 0 refills | Status: DC | PRN

## 2016-10-25 MED FILL — oxyCODONE HCL 10 MG TABS: 10 | 15 days supply | Qty: 90 | Fill #0

## 2016-10-25 NOTE — Progress Notes (Signed)
Reviewed [facility] Substance Reporting system prior to prescribing opiate medications. No inconsistencies noted.   Meds ordered this encounter  Medications  . Oxycodone HCl 10 MG TABS    Sig: Take 1 tablet (10 mg total) by mouth every 4 (four) hours as needed.    Dispense:  90 tablet    Refill:  0    Order Specific Question:   Supervising Provider    Answer:   Tresa Garter [[id number]]    Donia Pounds  MSN, FNP-C [facility] [address] [place], [place] [address] [phone]

## 2016-11-08 ENCOUNTER — Other Ambulatory Visit: Payer: Self-pay | Admitting: Family Medicine

## 2016-11-08 DIAGNOSIS — D5700 Hb-SS disease with crisis, unspecified: Secondary | ICD-10-CM

## 2016-11-08 MED ORDER — MORPHINE SULFATE ER 15 MG PO TBCR: 15.0000 mg | EXTENDED_RELEASE_TABLET | Freq: Two times a day (BID) | ORAL | 0 refills | Status: AC

## 2016-11-08 MED ORDER — OXYCODONE HCL 10 MG PO TABS: 10.0000 mg | ORAL_TABLET | ORAL | 0 refills | Status: AC | PRN

## 2016-11-08 NOTE — Progress Notes (Signed)
Reviewed [facility] Substance Reporting system prior to prescribing opiate medications. No inconsistencies noted.   Meds ordered this encounter  Medications  . Oxycodone HCl 10 MG TABS    Sig: Take 1 tablet (10 mg total) by mouth every 4 (four) hours as needed.    Dispense:  90 tablet    Refill:  0    Order Specific Question:   Supervising Provider    Answer:   Tresa Garter [[id number]]    Donia Pounds  MSN, FNP-C [facility] [address] [place], [place] [address] [phone]

## 2016-11-08 NOTE — Progress Notes (Signed)
Meds ordered this encounter  Medications  . morphine (MS CONTIN) 15 MG 12 hr tablet    Sig: Take 1 tablet (15 mg total) by mouth every 12 (twelve) hours.    Dispense:  60 tablet    Refill:  0    Rx not to be filled prior to 11/13/2016    Order Specific Question:   Supervising Provider    Answer:   Tresa Garter [[id number]]  Reviewed [facility] Substance Reporting system prior to prescribing opiate medications. No inconsistencies noted.     Donia Pounds  MSN, FNP-C [facility] [address] [place],  [address] [phone]

## 2016-11-10 DEATH — deceased

## 2016-11-18 ENCOUNTER — Ambulatory Visit: Payer: Medicare Other | Admitting: Family Medicine

## 2017-01-12 ENCOUNTER — Other Ambulatory Visit: Payer: Self-pay | Admitting: Internal Medicine

## 2017-10-01 IMAGING — CR DG CHEST 2V
2 series · 2 of 2 positions shown · non-contrast
Comparison: Chest x-ray and chest CT scan June 20, 2016

CLINICAL DATA: Sickle cell disease with chronic cough. History of
asthma.

EXAM:
CHEST  2 VIEW

[w chest pa]
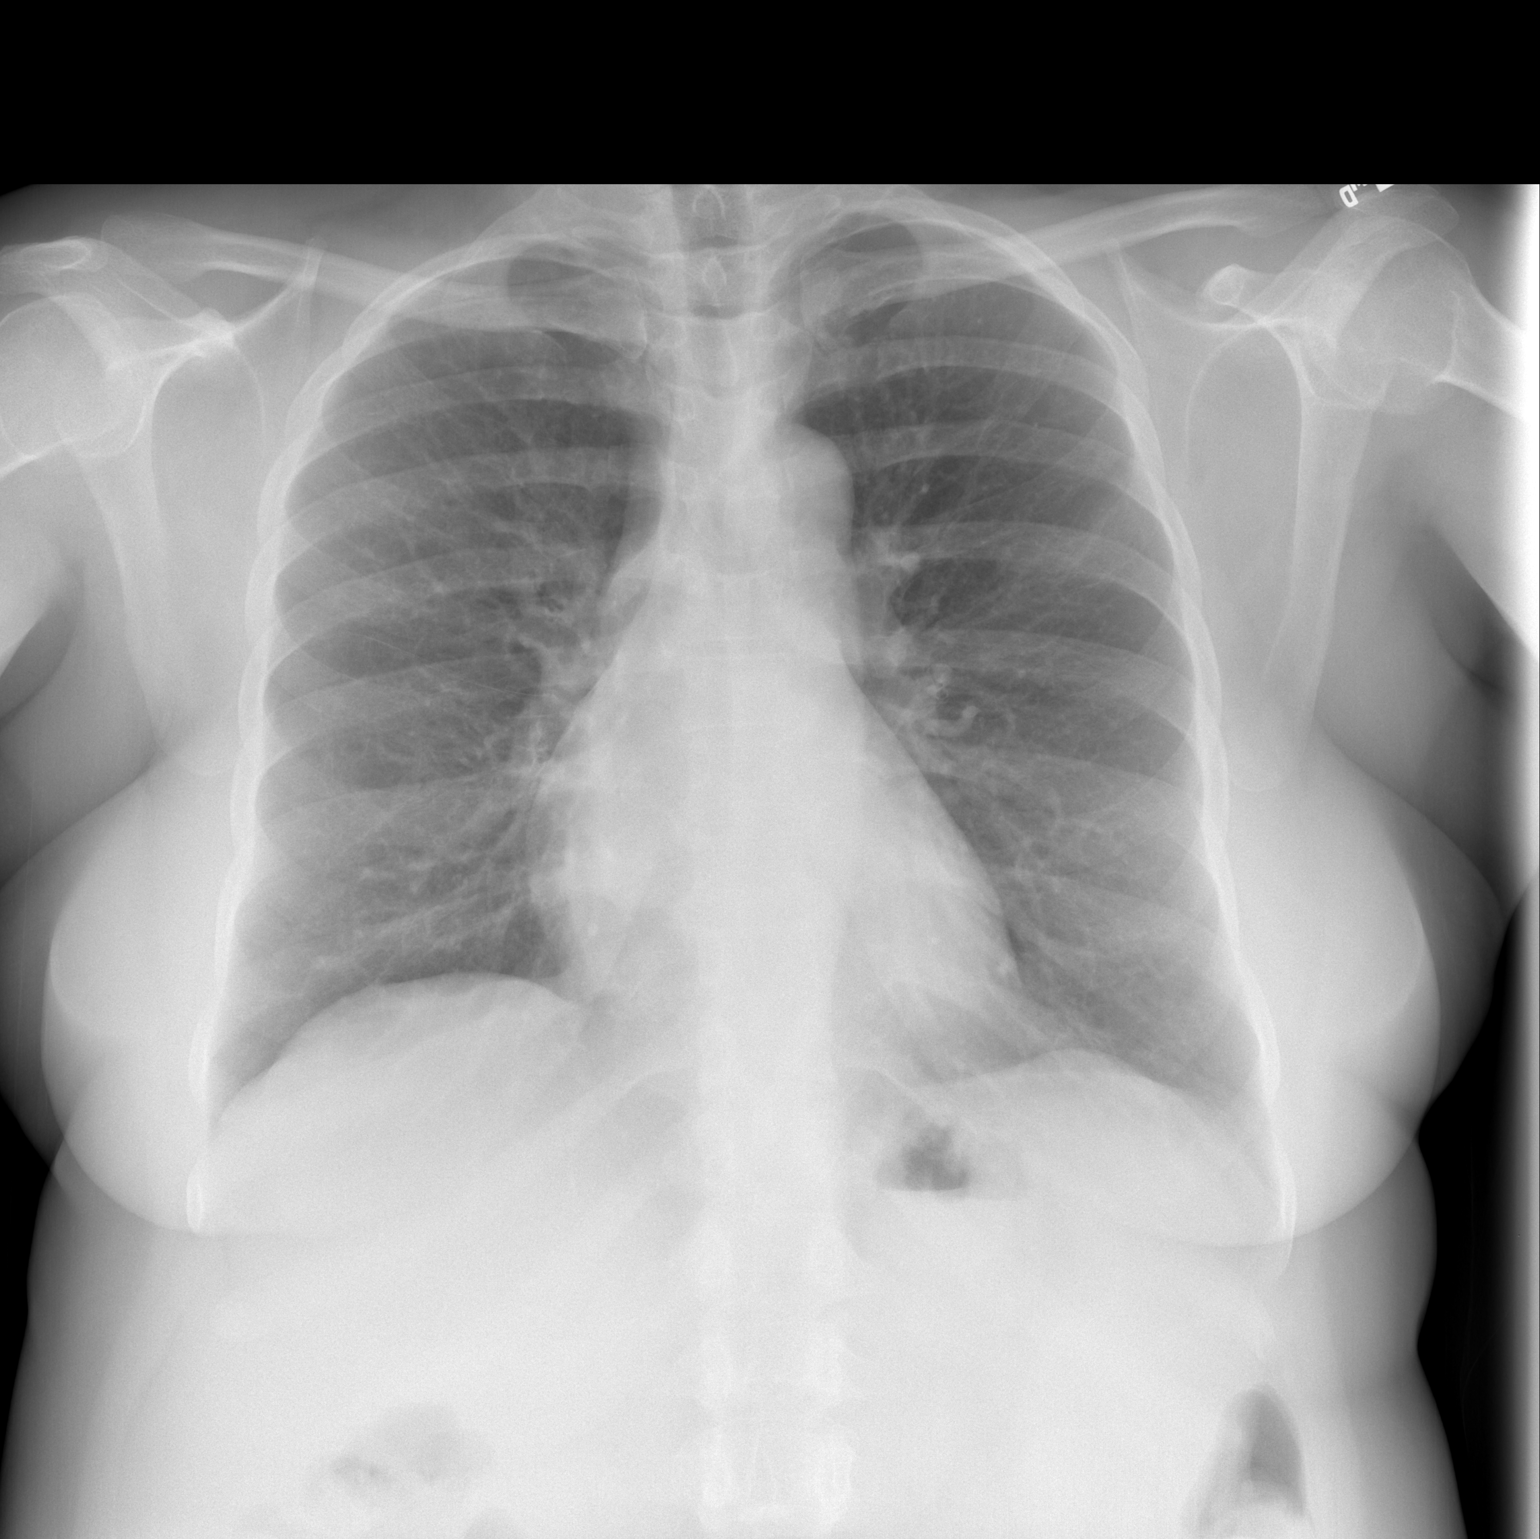

[w chest lat]
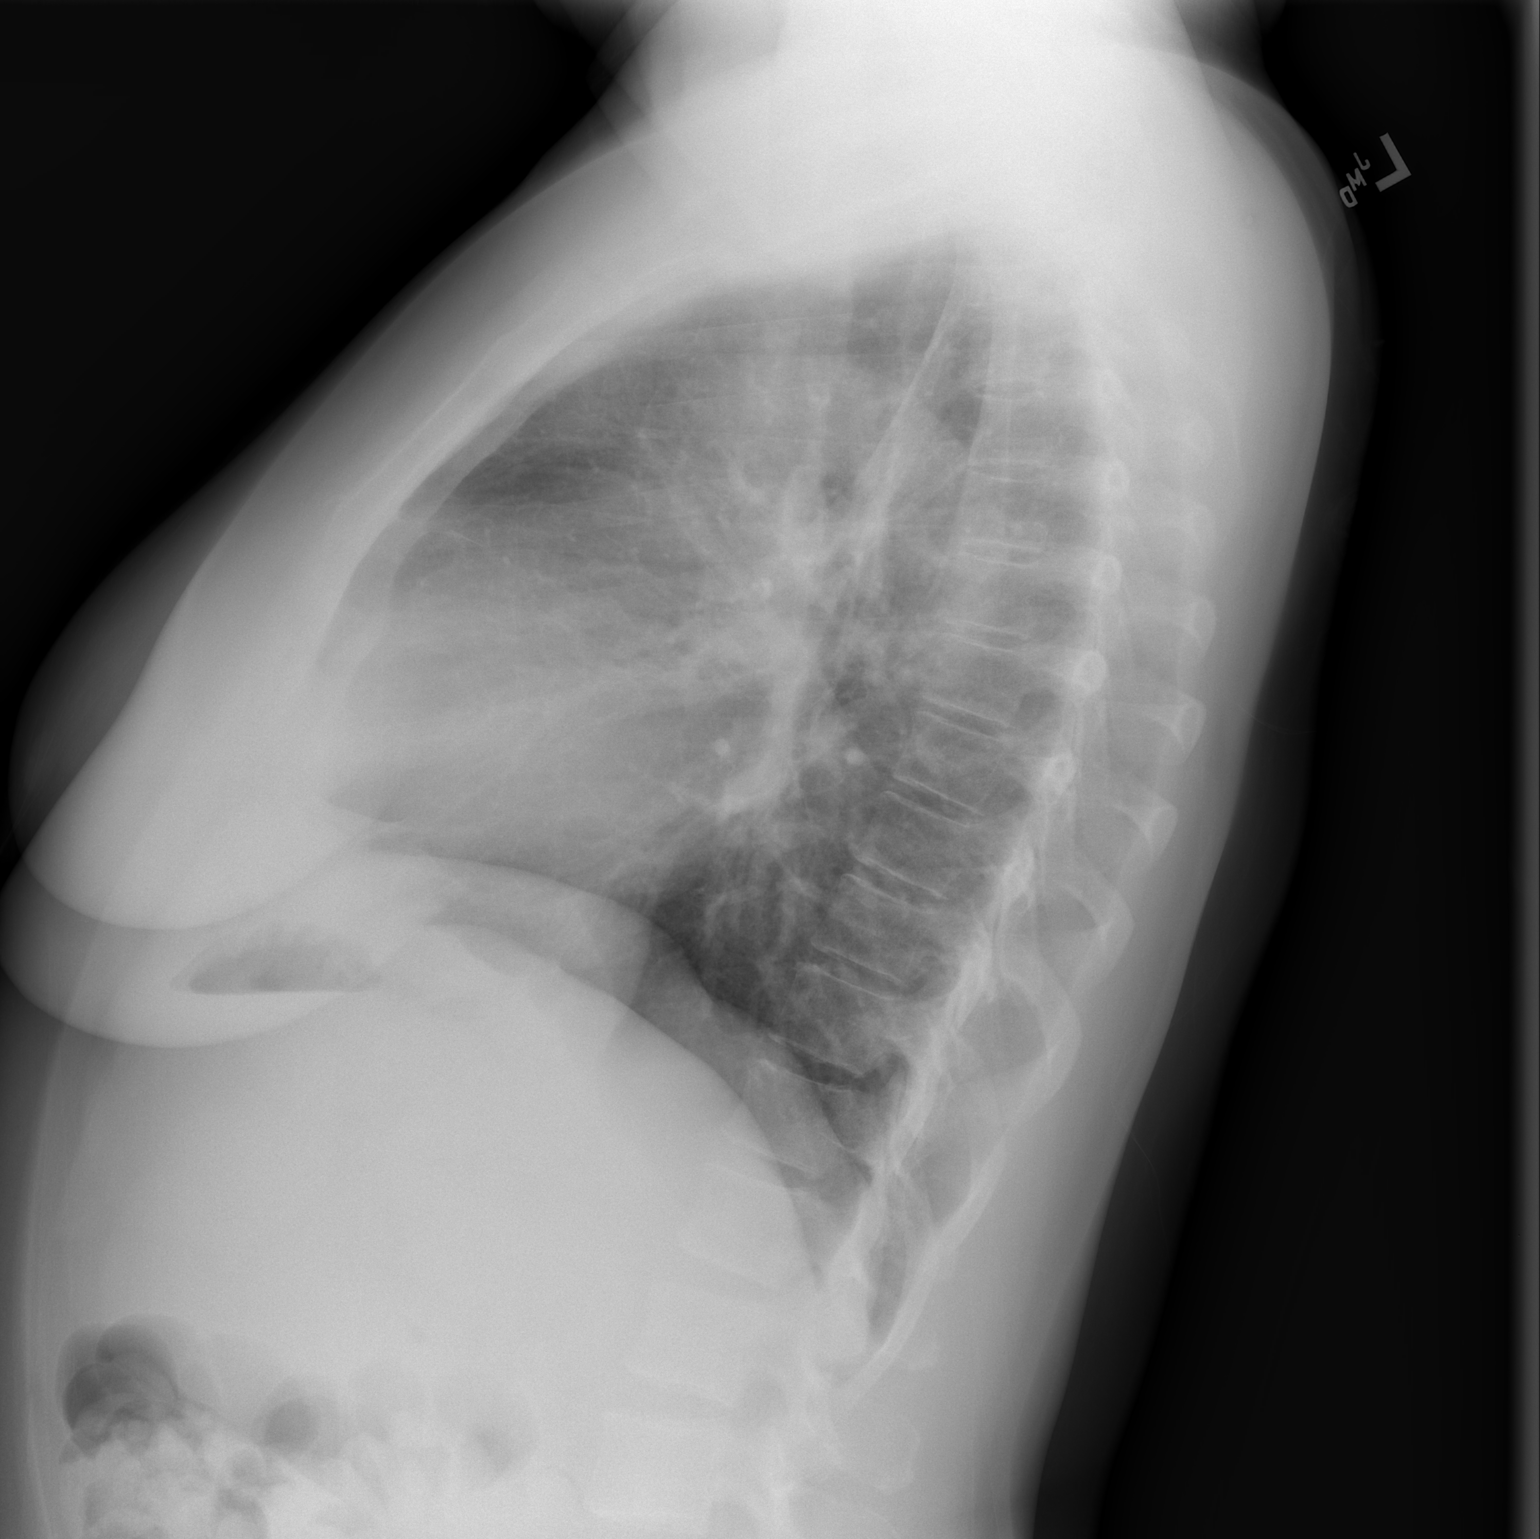

[2 of 2 positions shown; findings below may reference images not displayed]

FINDINGS: The lungs are adequately inflated and clear. The heart and pulmonary
vascularity are normal. The mediastinum is normal in width. The
trachea is midline. There is no pleural effusion. The bony thorax
exhibits no acute abnormality.
IMPRESSION: There is no active cardiopulmonary disease.

## 2018-09-08 NOTE — Telephone Encounter (Signed)
Message sent to provider 

## 2018-09-09 NOTE — Telephone Encounter (Signed)
Message sent to provider 

## 2018-09-10 NOTE — Telephone Encounter (Signed)
Message sent to provider 

## 2018-09-11 NOTE — Telephone Encounter (Signed)
Message sent to provider
